# Patient Record
Sex: Female | Born: 1955 | Race: White | Hispanic: No | Marital: Married | Smoking: Never smoker
Health system: Southern US, Community
[De-identification: ages and names within clinical notes are randomized; demographics above are authoritative.]

## PROBLEM LIST (undated history)

## (undated) DIAGNOSIS — M549 Dorsalgia, unspecified: Secondary | ICD-10-CM

## (undated) DIAGNOSIS — K760 Fatty (change of) liver, not elsewhere classified: Secondary | ICD-10-CM

## (undated) DIAGNOSIS — M2550 Pain in unspecified joint: Secondary | ICD-10-CM

## (undated) DIAGNOSIS — N189 Chronic kidney disease, unspecified: Secondary | ICD-10-CM

## (undated) DIAGNOSIS — G629 Polyneuropathy, unspecified: Secondary | ICD-10-CM

## (undated) DIAGNOSIS — R600 Localized edema: Secondary | ICD-10-CM

## (undated) DIAGNOSIS — I10 Essential (primary) hypertension: Secondary | ICD-10-CM

## (undated) DIAGNOSIS — E559 Vitamin D deficiency, unspecified: Secondary | ICD-10-CM

## (undated) DIAGNOSIS — M109 Gout, unspecified: Secondary | ICD-10-CM

## (undated) DIAGNOSIS — K219 Gastro-esophageal reflux disease without esophagitis: Secondary | ICD-10-CM

## (undated) DIAGNOSIS — K5900 Constipation, unspecified: Secondary | ICD-10-CM

## (undated) DIAGNOSIS — E669 Obesity, unspecified: Secondary | ICD-10-CM

## (undated) DIAGNOSIS — J189 Pneumonia, unspecified organism: Secondary | ICD-10-CM

## (undated) DIAGNOSIS — M1990 Unspecified osteoarthritis, unspecified site: Secondary | ICD-10-CM

## (undated) DIAGNOSIS — E785 Hyperlipidemia, unspecified: Secondary | ICD-10-CM

## (undated) DIAGNOSIS — K829 Disease of gallbladder, unspecified: Secondary | ICD-10-CM

## (undated) HISTORY — DX: Pain in unspecified joint: M25.50

## (undated) HISTORY — DX: Constipation, unspecified: K59.00

## (undated) HISTORY — PX: TONSILLECTOMY: SUR1361

## (undated) HISTORY — DX: Localized edema: R60.0

## (undated) HISTORY — DX: Fatty (change of) liver, not elsewhere classified: K76.0

## (undated) HISTORY — PX: WRIST SURGERY: SHX841

## (undated) HISTORY — DX: Vitamin D deficiency, unspecified: E55.9

## (undated) HISTORY — PX: HERNIA REPAIR: SHX51

## (undated) HISTORY — DX: Gout, unspecified: M10.9

## (undated) HISTORY — DX: Essential (primary) hypertension: I10

## (undated) HISTORY — DX: Hyperlipidemia, unspecified: E78.5

## (undated) HISTORY — PX: DENTAL SURGERY: SHX609

## (undated) HISTORY — DX: Obesity, unspecified: E66.9

## (undated) HISTORY — DX: Polyneuropathy, unspecified: G62.9

## (undated) HISTORY — DX: Dorsalgia, unspecified: M54.9

## (undated) HISTORY — DX: Disease of gallbladder, unspecified: K82.9

---

## 1979-07-03 DIAGNOSIS — J189 Pneumonia, unspecified organism: Secondary | ICD-10-CM

## 1979-07-03 HISTORY — DX: Pneumonia, unspecified organism: J18.9

## 1997-11-01 HISTORY — PX: ABDOMINAL HYSTERECTOMY: SHX81

## 1998-07-25 ENCOUNTER — Other Ambulatory Visit: Admission: RE | Admit: 1998-07-25 | Discharge: 1998-07-25 | Payer: Self-pay | Admitting: Obstetrics and Gynecology

## 1998-10-20 ENCOUNTER — Inpatient Hospital Stay (HOSPITAL_COMMUNITY): Admission: RE | Admit: 1998-10-20 | Discharge: 1998-10-22 | Payer: Self-pay | Admitting: Obstetrics and Gynecology

## 2000-06-03 ENCOUNTER — Other Ambulatory Visit: Admission: RE | Admit: 2000-06-03 | Discharge: 2000-06-03 | Payer: Self-pay | Admitting: Obstetrics and Gynecology

## 2001-06-12 ENCOUNTER — Other Ambulatory Visit: Admission: RE | Admit: 2001-06-12 | Discharge: 2001-06-12 | Payer: Self-pay | Admitting: Obstetrics and Gynecology

## 2002-09-11 ENCOUNTER — Encounter: Payer: Self-pay | Admitting: Obstetrics and Gynecology

## 2002-09-11 ENCOUNTER — Ambulatory Visit (HOSPITAL_COMMUNITY): Admission: RE | Admit: 2002-09-11 | Discharge: 2002-09-11 | Payer: Self-pay | Admitting: Obstetrics and Gynecology

## 2002-09-11 ENCOUNTER — Other Ambulatory Visit: Admission: RE | Admit: 2002-09-11 | Discharge: 2002-09-11 | Payer: Self-pay | Admitting: Obstetrics and Gynecology

## 2004-04-10 ENCOUNTER — Other Ambulatory Visit: Admission: RE | Admit: 2004-04-10 | Discharge: 2004-04-10 | Payer: Self-pay | Admitting: Obstetrics and Gynecology

## 2004-06-26 ENCOUNTER — Ambulatory Visit (HOSPITAL_COMMUNITY): Admission: RE | Admit: 2004-06-26 | Discharge: 2004-06-26 | Payer: Self-pay | Admitting: Family Medicine

## 2005-04-23 ENCOUNTER — Other Ambulatory Visit: Admission: RE | Admit: 2005-04-23 | Discharge: 2005-04-23 | Payer: Self-pay | Admitting: Obstetrics and Gynecology

## 2008-02-16 ENCOUNTER — Encounter: Admission: RE | Admit: 2008-02-16 | Discharge: 2008-02-16 | Payer: Self-pay | Admitting: Family Medicine

## 2008-03-05 ENCOUNTER — Encounter: Admission: RE | Admit: 2008-03-05 | Discharge: 2008-03-05 | Payer: Self-pay | Admitting: Family Medicine

## 2008-11-01 HISTORY — PX: CHOLECYSTECTOMY: SHX55

## 2009-04-11 ENCOUNTER — Encounter (INDEPENDENT_AMBULATORY_CARE_PROVIDER_SITE_OTHER): Payer: Self-pay | Admitting: General Surgery

## 2009-04-11 ENCOUNTER — Ambulatory Visit (HOSPITAL_COMMUNITY): Admission: RE | Admit: 2009-04-11 | Discharge: 2009-04-11 | Payer: Self-pay | Admitting: General Surgery

## 2010-11-01 HISTORY — PX: TOE SURGERY: SHX1073

## 2011-02-08 LAB — DIFFERENTIAL
Basophils Absolute: 0 10*3/uL (ref 0.0–0.1)
Basophils Relative: 1 % (ref 0–1)
Eosinophils Relative: 2 % (ref 0–5)
Lymphocytes Relative: 27 % (ref 12–46)
Monocytes Absolute: 0.7 10*3/uL (ref 0.1–1.0)

## 2011-02-08 LAB — CBC
HCT: 41.3 % (ref 36.0–46.0)
Hemoglobin: 14.1 g/dL (ref 12.0–15.0)
MCHC: 34 g/dL (ref 30.0–36.0)
MCV: 84 fL (ref 78.0–100.0)
Platelets: 205 10*3/uL (ref 150–400)
RBC: 4.92 MIL/uL (ref 3.87–5.11)
RDW: 14.1 % (ref 11.5–15.5)
WBC: 6.8 10*3/uL (ref 4.0–10.5)

## 2011-02-08 LAB — COMPREHENSIVE METABOLIC PANEL
ALT: 102 U/L — ABNORMAL HIGH (ref 0–35)
AST: 59 U/L — ABNORMAL HIGH (ref 0–37)
Albumin: 3.5 g/dL (ref 3.5–5.2)
Alkaline Phosphatase: 84 U/L (ref 39–117)
BUN: 15 mg/dL (ref 6–23)
CO2: 27 mEq/L (ref 19–32)
Calcium: 9.6 mg/dL (ref 8.4–10.5)
Chloride: 103 mEq/L (ref 96–112)
Creatinine, Ser: 0.69 mg/dL (ref 0.4–1.2)
GFR calc Af Amer: >60 mL/min (ref >60)
GFR calc non Af Amer: >60 mL/min (ref >60)
Glucose, Bld: 98 mg/dL (ref 70–99)
Potassium: 3.8 mEq/L (ref 3.5–5.1)
Sodium: 138 mEq/L (ref 135–145)
Total Bilirubin: 0.7 mg/dL (ref 0.3–1.2)
Total Protein: 6.2 g/dL (ref 6.0–8.3)

## 2011-02-08 LAB — HEPATIC FUNCTION PANEL
ALT: 91 U/L — ABNORMAL HIGH (ref 0–35)
Bilirubin, Direct: <0.1 mg/dL (ref 0.0–0.3)
Total Bilirubin: 1 mg/dL (ref 0.3–1.2)

## 2011-03-16 NOTE — Op Note (Signed)
Evelyn Campbell, Evelyn Campbell             ACCOUNT NO.:  [id number]   MEDICAL RECORD NO.:  [id number]          PATIENT TYPE:  AMB   LOCATION:  SDS                          FACILITY:  [facility]   PHYSICIAN:  Cherylynn Ridges, M.D.    DATE OF BIRTH:  [birth year]   DATE OF PROCEDURE:  DATE OF DISCHARGE:  04/11/2009                               OPERATIVE REPORT   PREOPERATIVE DIAGNOSIS:  Gallbladder polyps with symptoms.   POSTOPERATIVE DIAGNOSIS:  Gallbladder polyps with symptoms.   PROCEDURE:  Laparoscopic cholecystectomy with cholangiogram.   SURGEON:  Cherylynn Ridges, M.D.   ASSISTANT:  Ollen Gross. Vernell Morgans, M.D.   ANESTHESIA:  General endotracheal.   ESTIMATED BLOOD LOSS:  Less than 20 mL.   COMPLICATIONS:  None.   CONDITION:  Stable.   FINDINGS:  Cystic artery which came out side of the triangle of Calot.  Normal cholangiogram.  Good flow to the duodenum, no obstruction.   INDICATIONS FOR OPERATION:  The patient is a 55 year old woman with non  gallbladder polyps and symptoms.  He now comes in for a laparoscopic  cholecystectomy operation.  The patient was taken to the operating room,  placed on table in supine position.  After an adequate general  endotracheal anesthetic was administered, she was prepped and draped in  usual sterile manner exposing the midline in the right upper quadrant.   A supraumbilical midline incision was made using #11 blade and taken  down to the midline fascia.  We grabbed the fascia with 2 Kocher clamps  and then incised between the clamps using a 15 blade into the  preperitoneal space.  We bluntly dissected down through the  preperitoneal space into the peritoneum and then passed a pursestring  suture of 0 Vicryl around the fascial opening.  A Hasson cannula was  passed through the fascia, into the peritoneal cavity and secured in  place with pursestring suture.  Once this was done, carbon dioxide gas  was insufflated into the peritoneal cavity up to a maximum  intra-  abdominal pressure of 15 mmHg.   Two right costal margin 5-mm cannulas and a subxiphoid 12 and 11-mm  cannula passed under direct vision.  Once this was done the patient was  placed in reverse Trendelenburg.  The left side was tilted down and then  dissection began.   The gallbladder had some adhesions to it which were bluntly dissected  away including heart of the duodenum without any injury to the  surrounding structures.  We grabbed the infundibulum with the second  grasper and then dissected out the peritoneum overlying the triangle of  Calot and hepatoduodenal triangle.  There was a tubular structure  outside of the triangle which we dissected free getting an adequate  window which was the primary cystic artery which clipped proximally and  distally with Endoclips.  We then dissected out the cystic duct, got an  adequate wound around it, placed a proximal clip along the gallbladder  side.  We then made a cholecystodochotomy using laparoscopic scissors.  This allowed [name] to pass a cholangiocath into the good cystic duct in  which we performed a cholangiogram, showed good flow into the duodenum,  no intraductal filling defects, no dilatation, and good proximal flow.   Once the cholangiogram was completed, we removed the clips securing in a  place, and then clipped the distal cystic duct with the catheter  removed.  Three clips were placed.  We then transected the cystic duct.  We then dissected out the gallbladder from its bed with minimal  difficulty.  We denied using EndoCatch bag to remove, we removed it  directly from the supraumbilical site with no difficulty.  We closed off  the supraumbilical fascia using a pursestring suture, which was in  place.  We then aspirated all fluid and gas from above the liver and  removed all cannulas.   We injected 0.25% Marcaine with epi at all sites.  We closed  supraumbilical skin using a running subcuticular stitch with 4-0   Monocryl.  The subxiphoid site was closed using 4-0 Monocryl.  We then  closed the lateral cannula sites using Dermabond.  Steri-Strips,  Dermabond and Tegaderms were placed at all sites.  All needles counts,  sponges counts, and instruments counts were correct.      Cherylynn Ridges, M.D.  Electronically Signed     JOW/MEDQ  D:  04/11/2009  T:  04/12/2009  Job:  [id number]   cc:   Jethro Bastos, M.D.  Duncan Dull, M.D.

## 2012-06-01 ENCOUNTER — Other Ambulatory Visit: Payer: Self-pay | Admitting: Obstetrics and Gynecology

## 2013-05-10 DIAGNOSIS — M19079 Primary osteoarthritis, unspecified ankle and foot: Secondary | ICD-10-CM | POA: Insufficient documentation

## 2013-05-10 DIAGNOSIS — M76829 Posterior tibial tendinitis, unspecified leg: Secondary | ICD-10-CM | POA: Insufficient documentation

## 2013-05-10 DIAGNOSIS — M2140 Flat foot [pes planus] (acquired), unspecified foot: Secondary | ICD-10-CM | POA: Insufficient documentation

## 2013-06-04 ENCOUNTER — Other Ambulatory Visit: Payer: Self-pay | Admitting: Obstetrics and Gynecology

## 2014-02-05 DIAGNOSIS — M14672 Charcot's joint, left ankle and foot: Secondary | ICD-10-CM | POA: Diagnosis present

## 2014-02-05 DIAGNOSIS — M21869 Other specified acquired deformities of unspecified lower leg: Secondary | ICD-10-CM | POA: Insufficient documentation

## 2014-04-01 HISTORY — PX: ANKLE FUSION: SHX5718

## 2014-05-07 DIAGNOSIS — Z981 Arthrodesis status: Secondary | ICD-10-CM | POA: Insufficient documentation

## 2014-06-14 ENCOUNTER — Ambulatory Visit (INDEPENDENT_AMBULATORY_CARE_PROVIDER_SITE_OTHER): Payer: BC Managed Care – PPO | Admitting: Cardiology

## 2014-06-14 ENCOUNTER — Encounter: Payer: Self-pay | Admitting: Cardiology

## 2014-06-14 VITALS — BP 110/76 | HR 76 | Ht 66.0 in | Wt 227.0 lb

## 2014-06-14 DIAGNOSIS — Z8249 Family history of ischemic heart disease and other diseases of the circulatory system: Secondary | ICD-10-CM | POA: Insufficient documentation

## 2014-06-14 DIAGNOSIS — Z6841 Body Mass Index (BMI) 40.0 and over, adult: Secondary | ICD-10-CM | POA: Insufficient documentation

## 2014-06-14 DIAGNOSIS — E669 Obesity, unspecified: Secondary | ICD-10-CM

## 2014-06-14 DIAGNOSIS — I10 Essential (primary) hypertension: Secondary | ICD-10-CM | POA: Insufficient documentation

## 2014-06-14 MED ORDER — ASPIRIN EC 81 MG PO TBEC: 81.0000 mg | DELAYED_RELEASE_TABLET | Freq: Every day | ORAL | Status: DC

## 2014-06-14 NOTE — Progress Notes (Signed)
[facility]. [address], [address], [place]  [address] Phone: [phone] Fax:  [phone]  Date:  06/14/2014   ID:  Evelyn Campbell, DOB 1956-03-16, MRN [id number]  PCP:  Cari Caraway, MD   History of Present Illness: Evelyn Campbell is a 58 y.o. female with hyperlipidemia, hypertriglyceridemia, hypertension. Early family history of coronary artery- father died at age 75 from MI.   She has been seeing [name], Pharm.D. in the lipid clinic. See current drug regimen. We have not made any changes today.   Niaspan, intolerant to statins. I'm fine with normal lipid panels.  No complaints of chest pain, shortness of breath, fevers. It is hard for her to lose weight but despite her sedentary state currently after ankle reconstruction, she is losing weight.  She had right ankle reconstruction at [facility]. 8 weeks out.  She is here with her husband.   Wt Readings from Last 3 Encounters:  06/14/14 227 lb (102.967 kg)     Past Medical History  Diagnosis Date  . Hypertension   . Hyperlipidemia   . Fatty liver   . Vitamin D deficiency   . Peripheral neuropathy     No past surgical history on file.  Current Outpatient Prescriptions  Medication Sig Dispense Refill  . amLODipine (NORVASC) 5 MG tablet Take 5 mg by mouth daily.      Marland Kitchen aspirin 325 MG tablet Take 325 mg by mouth daily.      Marland Kitchen atenolol-chlorthalidone (TENORETIC) 100-25 MG per tablet Take 1 tablet by mouth daily.      . calcium carbonate (OS-CAL) 600 MG TABS tablet Take 600 mg by mouth 2 (two) times daily with a meal.      . Cholecalciferol (VITAMIN D3) 1000 UNITS CAPS Take by mouth.      . docusate sodium (STOOL SOFTENER) 100 MG capsule Take 100 mg by mouth 2 (two) times daily.      Marland Kitchen FIBER COMPLETE PO Take by mouth.      . gabapentin (NEURONTIN) 300 MG capsule Take 300 mg by mouth 3 (three) times daily.      Marland Kitchen lisinopril (PRINIVIL,ZESTRIL) 20 MG tablet Take 20 mg by mouth daily.      . Multiple  Vitamin (MULTIVITAMIN) capsule Take 1 capsule by mouth daily.      . niacin (NIASPAN) 1000 MG CR tablet Take 1,000 mg by mouth at bedtime.      Marland Kitchen omega-3 acid ethyl esters (LOVAZA) 1 G capsule Take by mouth 2 (two) times daily.      Marland Kitchen oxyCODONE-acetaminophen (PERCOCET/ROXICET) 5-325 MG per tablet       . Probiotic Product (PEARLS IC PO) Take by mouth.       No current facility-administered medications for this visit.    Allergies:    Allergies  Allergen Reactions  . Ibuprofen   . Lipitor [Atorvastatin]     myalgia    Social History:  The patient  reports that she has never smoked. She does not have any smokeless tobacco history on file.   Family History  Problem Relation Age of Onset  . Heart failure Father     ROS:  Please see the history of present illness.   No syncope, no bleeding, no orthopnea, no PND   All other systems reviewed and negative.   PHYSICAL EXAM: VS:  BP 110/76  Pulse 76  Ht 5\' 6"  (1.676 m)  Wt 227 lb (102.967 kg)  BMI 36.66  kg/m2 Well nourished, well developed, in no acute distress HEENT: normal, [place]/AT, EOMI Neck: no JVD, normal carotid upstroke, no bruit Cardiac:  normal S1, S2; RRR; no murmur Lungs:  clear to auscultation bilaterally, no wheezing, rhonchi or rales Abd: soft, nontender, no hepatomegaly, no bruits obese Ext: no edema, right ankle boot in place, scooter. Skin: warm and dry GU: deferred Neuro: no focal abnormalities noted, AAO x 3  EKG:  06/14/14 -- sinus rhythm, 76, and no other abnormalities.    LABS: LDL 125 Prior medical records reviewed.  ASSESSMENT AND PLAN:  1. Early family history of coronary artery disease-continue with aggressive primary prevention. She has been intolerant to statins therefore utilize niacin. Last LDL 125. Slightly increased from 108. I am fine with her transitioning to a low-dose aspirin. 2. Obesity-continue to encourage weight loss, low carbohydrate, diet, exercise. She is losing weight.  Continue. 3. Hypertension-currently well controlled, medications reviewed 4. One year follow up.  Signed, Candee Furbish, MD [employer]  06/14/2014 12:14 PM

## 2014-06-14 NOTE — Patient Instructions (Addendum)
Please decrease asprin to 81 mg a day.  Continue all other medications as listed.  Follow up in 1 year with Dr. Marlou Porch.  You will receive a letter in the mail 2 months before you are due.  Please call us when you receive this letter to schedule your follow up appointment.

## 2014-06-26 ENCOUNTER — Emergency Department (HOSPITAL_COMMUNITY)
Admission: EM | Admit: 2014-06-26 | Discharge: 2014-06-26 | Disposition: A | Discharge status: Home / Self Care | Payer: BC Managed Care – PPO | Attending: Emergency Medicine | Admitting: Emergency Medicine

## 2014-06-26 ENCOUNTER — Emergency Department (HOSPITAL_COMMUNITY): Payer: BC Managed Care – PPO

## 2014-06-26 ENCOUNTER — Encounter (HOSPITAL_COMMUNITY): Payer: Self-pay | Admitting: Emergency Medicine

## 2014-06-26 DIAGNOSIS — E669 Obesity, unspecified: Secondary | ICD-10-CM | POA: Insufficient documentation

## 2014-06-26 DIAGNOSIS — G609 Hereditary and idiopathic neuropathy, unspecified: Secondary | ICD-10-CM | POA: Diagnosis not present

## 2014-06-26 DIAGNOSIS — E785 Hyperlipidemia, unspecified: Secondary | ICD-10-CM | POA: Insufficient documentation

## 2014-06-26 DIAGNOSIS — Z7982 Long term (current) use of aspirin: Secondary | ICD-10-CM | POA: Insufficient documentation

## 2014-06-26 DIAGNOSIS — I10 Essential (primary) hypertension: Secondary | ICD-10-CM | POA: Diagnosis not present

## 2014-06-26 DIAGNOSIS — Z79899 Other long term (current) drug therapy: Secondary | ICD-10-CM | POA: Insufficient documentation

## 2014-06-26 DIAGNOSIS — E559 Vitamin D deficiency, unspecified: Secondary | ICD-10-CM | POA: Diagnosis not present

## 2014-06-26 DIAGNOSIS — N949 Unspecified condition associated with female genital organs and menstrual cycle: Secondary | ICD-10-CM | POA: Insufficient documentation

## 2014-06-26 DIAGNOSIS — N289 Disorder of kidney and ureter, unspecified: Secondary | ICD-10-CM | POA: Diagnosis not present

## 2014-06-26 DIAGNOSIS — K4020 Bilateral inguinal hernia, without obstruction or gangrene, not specified as recurrent: Secondary | ICD-10-CM | POA: Diagnosis not present

## 2014-06-26 DIAGNOSIS — K4021 Bilateral inguinal hernia, without obstruction or gangrene, recurrent: Secondary | ICD-10-CM

## 2014-06-26 LAB — URINALYSIS, ROUTINE W REFLEX MICROSCOPIC
Bilirubin Urine: NEGATIVE
Glucose, UA: NEGATIVE mg/dL
Hgb urine dipstick: NEGATIVE
KETONES UR: NEGATIVE mg/dL
Nitrite: NEGATIVE
PROTEIN: NEGATIVE mg/dL
Specific Gravity, Urine: 1.02 (ref 1.005–1.030)
UROBILINOGEN UA: 0.2 mg/dL (ref 0.0–1.0)
pH: 6 (ref 5.0–8.0)

## 2014-06-26 LAB — CBC WITH DIFFERENTIAL/PLATELET
BASOS ABS: 0 10*3/uL (ref 0.0–0.1)
BASOS PCT: 0 % (ref 0–1)
Eosinophils Absolute: 0.1 10*3/uL (ref 0.0–0.7)
Eosinophils Relative: 1 % (ref 0–5)
HCT: 36.3 % (ref 36.0–46.0)
HEMOGLOBIN: 11.9 g/dL — AB (ref 12.0–15.0)
Lymphocytes Relative: 30 % (ref 12–46)
Lymphs Abs: 2.6 10*3/uL (ref 0.7–4.0)
MCH: 28.1 pg (ref 26.0–34.0)
MCHC: 32.8 g/dL (ref 30.0–36.0)
MCV: 85.6 fL (ref 78.0–100.0)
MONOS PCT: 8 % (ref 3–12)
Monocytes Absolute: 0.7 10*3/uL (ref 0.1–1.0)
NEUTROS ABS: 5.3 10*3/uL (ref 1.7–7.7)
NEUTROS PCT: 61 % (ref 43–77)
PLATELETS: 210 10*3/uL (ref 150–400)
RBC: 4.24 MIL/uL (ref 3.87–5.11)
RDW: 15.7 % — ABNORMAL HIGH (ref 11.5–15.5)
WBC: 8.7 10*3/uL (ref 4.0–10.5)

## 2014-06-26 LAB — HEPATIC FUNCTION PANEL
ALBUMIN: 3.5 g/dL (ref 3.5–5.2)
ALT: 30 U/L (ref 0–35)
AST: 22 U/L (ref 0–37)
Alkaline Phosphatase: 79 U/L (ref 39–117)
Bilirubin, Direct: <0.2 mg/dL (ref 0.0–0.3)
TOTAL PROTEIN: 6.8 g/dL (ref 6.0–8.3)
Total Bilirubin: 0.2 mg/dL — ABNORMAL LOW (ref 0.3–1.2)

## 2014-06-26 LAB — URINE MICROSCOPIC-ADD ON

## 2014-06-26 LAB — WET PREP, GENITAL
CLUE CELLS WET PREP: NONE SEEN
TRICH WET PREP: NONE SEEN
Yeast Wet Prep HPF POC: NONE SEEN

## 2014-06-26 LAB — BASIC METABOLIC PANEL
ANION GAP: 14 (ref 5–15)
BUN: 36 mg/dL — AB (ref 6–23)
CHLORIDE: 100 meq/L (ref 96–112)
CO2: 26 mEq/L (ref 19–32)
Calcium: 9.7 mg/dL (ref 8.4–10.5)
Creatinine, Ser: 0.82 mg/dL (ref 0.50–1.10)
GFR, EST AFRICAN AMERICAN: 88 mL/min — AB (ref >90)
GFR, EST NON AFRICAN AMERICAN: 76 mL/min — AB (ref >90)
Glucose, Bld: 146 mg/dL — ABNORMAL HIGH (ref 70–99)
POTASSIUM: 3.7 meq/L (ref 3.7–5.3)
SODIUM: 140 meq/L (ref 137–147)

## 2014-06-26 LAB — PROTIME-INR
INR: 0.98 (ref 0.00–1.49)
PROTHROMBIN TIME: 13 s (ref 11.6–15.2)

## 2014-06-26 LAB — APTT: APTT: 28 s (ref 24–37)

## 2014-06-26 LAB — LIPASE, BLOOD: LIPASE: 44 U/L (ref 11–59)

## 2014-06-26 MED ORDER — IOHEXOL 300 MG/ML  SOLN
100.0000 mL | Freq: Once | INTRAMUSCULAR | Status: AC | PRN
  Administered 2014-06-26: 100 mL via INTRAVENOUS

## 2014-06-26 MED ORDER — HYDROCODONE-ACETAMINOPHEN 5-325 MG PO TABS: ORAL_TABLET | ORAL | Status: DC

## 2014-06-26 MED ORDER — IOHEXOL 300 MG/ML  SOLN
50.0000 mL | Freq: Once | INTRAMUSCULAR | Status: AC | PRN
  Administered 2014-06-26: 50 mL via ORAL

## 2014-06-26 NOTE — ED Notes (Signed)
Pt from PCP, states that pt has incarcerated bowel in an inguinal hernia.

## 2014-06-26 NOTE — ED Notes (Signed)
Pt reports pelvic pain with nausea which started last week and got better.  Pt reports pain recurred yesterday with severe nausea denies any vomiting.  Pt coming from her GYN MD today, concerned about a blockage.  Pt denies urinary sxs at this time.  Pt reports feeling a hard lump in her pelvic area, pt also reports low back pain.

## 2014-06-26 NOTE — ED Provider Notes (Signed)
CSN: [id number]     Arrival date & time 06/26/14  1704 History   First MD Initiated Contact with Patient 06/26/14 1736     Chief Complaint  Patient presents with  . Pelvic Pain  . Nausea     (Consider location/radiation/quality/duration/timing/severity/associated sxs/prior Treatment) Patient is a 58 y.o. female presenting with pelvic pain.  Pelvic Pain    Evelyn Campbell is a 58 y.o. female with past medical history significant for hypertension, hyperlipidemia complaining of 7/10 left-sided pelvic pain intermittently for the last several months, starting again today. She has associated vomiting with. Constipation and diarrhea over the last several days. Patient states that she feels a lump in the area where her pain is. Patient denies abnormal vaginal discharge,  Dysuria, hematuria. She went to her OB/GYN and was sent to the ED for concern of her small bowel obstruction. No pelvic exam was performed at the OB/GYN. Patient denies any fever, chills, chest pain, shortness of breath, history of extensive abdominal surgeries, history of small bowel obstruction.     Past Medical History  Diagnosis Date  . Hypertension   . Hyperlipidemia   . Fatty liver   . Vitamin D deficiency   . Peripheral neuropathy    History reviewed. No pertinent past surgical history. Family History  Problem Relation Age of Onset  . Heart failure Father    History  Substance Use Topics  . Smoking status: Never Smoker   . Smokeless tobacco: Not on file  . Alcohol Use: No   OB History   Grav Para Term Preterm Abortions TAB SAB Ect Mult Living                 Review of Systems  Genitourinary: Positive for pelvic pain.    10 systems reviewed and found to be negative, except as noted in the HPI.   Allergies  Celebrex; Ibuprofen; Lipitor; Meloxicam; and Statins  Home Medications   Prior to Admission medications   Medication Sig Start Date End Date Taking? Authorizing Provider  amLODipine (NORVASC)  5 MG tablet Take 5 mg by mouth daily.   Yes Historical Provider, MD  aspirin 325 MG tablet Take 325 mg by mouth at bedtime.   Yes Historical Provider, MD  atenolol-chlorthalidone (TENORETIC) 100-25 MG per tablet Take 1 tablet by mouth daily.   Yes Historical Provider, MD  calcium carbonate (OS-CAL) 600 MG TABS tablet Take 600 mg by mouth 2 (two) times daily with a meal.   Yes Historical Provider, MD  Cholecalciferol (VITAMIN D3) 1000 UNITS CAPS Take by mouth.   Yes Historical Provider, MD  docusate sodium (STOOL SOFTENER) 100 MG capsule Take 100 mg by mouth 2 (two) times daily.   Yes Historical Provider, MD  FIBER COMPLETE PO Take by mouth.   Yes Historical Provider, MD  gabapentin (NEURONTIN) 300 MG capsule Take 900 mg by mouth at bedtime.    Yes Historical Provider, MD  lisinopril (PRINIVIL,ZESTRIL) 20 MG tablet Take 20 mg by mouth daily.   Yes Historical Provider, MD  Multiple Vitamin (MULTIVITAMIN) capsule Take 1 capsule by mouth daily.   Yes Historical Provider, MD  niacin (NIASPAN) 1000 MG CR tablet Take 2,000 mg by mouth at bedtime.    Yes Historical Provider, MD  omega-3 acid ethyl esters (LOVAZA) 1 G capsule Take 2 g by mouth 2 (two) times daily.    Yes Historical Provider, MD  HYDROcodone-acetaminophen (NORCO/VICODIN) 5-325 MG per tablet Take 1-2 tablets by mouth every 6 hours as needed  for pain. 06/26/14   Ramsie Ostrander, PA-C   BP 132/72  Pulse 66  Temp(Src) 97.8 F (36.6 C) (Oral)  Resp 17  SpO2 99% Physical Exam  Nursing note and vitals reviewed. Constitutional: She is oriented to person, place, and time. She appears well-developed and well-nourished. No distress.  Obese  HENT:  Head: Normocephalic.  Mouth/Throat: Oropharynx is clear and moist.  Eyes: Conjunctivae and EOM are normal. Pupils are equal, round, and reactive to light.  Neck: Normal range of motion.  Cardiovascular: Normal rate, regular rhythm and intact distal pulses.   Pulmonary/Chest: Effort normal and  breath sounds normal. No stridor.  Abdominal: Soft. Bowel sounds are normal. She exhibits no distension and no mass. There is tenderness. There is no rebound and no guarding.    Musculoskeletal: Normal range of motion. She exhibits no edema.  Cam Walker to right lower extremity  Neurological: She is alert and oriented to person, place, and time.  Skin: Skin is warm.  Psychiatric: She has a normal mood and affect.    ED Course  Procedures (including critical care time) Labs Review Labs Reviewed  WET PREP, GENITAL - Abnormal; Notable for the following:    WBC, Wet Prep HPF POC FEW (*)    All other components within normal limits  CBC WITH DIFFERENTIAL - Abnormal; Notable for the following:    Hemoglobin 11.9 (*)    RDW 15.7 (*)    All other components within normal limits  BASIC METABOLIC PANEL - Abnormal; Notable for the following:    Glucose, Bld 146 (*)    BUN 36 (*)    GFR calc non Af Amer 76 (*)    GFR calc Af Amer 88 (*)    All other components within normal limits  HEPATIC FUNCTION PANEL - Abnormal; Notable for the following:    Total Bilirubin 0.2 (*)    All other components within normal limits  URINALYSIS, ROUTINE W REFLEX MICROSCOPIC - Abnormal; Notable for the following:    APPearance CLOUDY (*)    Leukocytes, UA SMALL (*)    All other components within normal limits  URINE MICROSCOPIC-ADD ON - Abnormal; Notable for the following:    Squamous Epithelial / LPF FEW (*)    Casts HYALINE CASTS (*)    All other components within normal limits  GC/CHLAMYDIA PROBE AMP  PROTIME-INR  APTT  LIPASE, BLOOD    Imaging Review Ct Abdomen Pelvis W Contrast  06/26/2014   CLINICAL DATA:  Left lower quadrant pain. Question incarcerated hernia.  EXAM: CT ABDOMEN AND PELVIS WITH CONTRAST  TECHNIQUE: Multidetector CT imaging of the abdomen and pelvis was performed using the standard protocol following bolus administration of intravenous contrast.  CONTRAST:  50 mL OMNIPAQUE IOHEXOL  300 MG/ML SOLN, 100 mL OMNIPAQUE IOHEXOL 300 MG/ML SOLN  COMPARISON:  None.  FINDINGS: The lung bases are clear.  No pleural or pericardial effusion.  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 There is a small amount of macroscopic fat within the lesion. The right adrenal gland is unremarkable. The patient is status post cholecystectomy. The liver, spleen and pancreas appear normal. There is a 1.7 cm lesion in the left kidney which is indeterminate. It does not have the typical appearance of a cyst. Small low attenuating lesions in the right kidney are compatible with cysts. The patient has a fat containing ventral hernia measuring 4.7 cm craniocaudal by 3.3 cm transverse. There are also bilateral inguinal hernias  containing fat. The left inguinal hernia is larger and has a small amount of fluid.  The patient is status post hysterectomy. The stomach and small and large bowel are unremarkable. The appendix is not visualized and may have been removed. There is no lymphadenopathy. No focal bony abnormality is identified.  IMPRESSION: Indeterminate left renal lesion. MRI with and without contrast is recommended for further evaluation.  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  Negative for incarcerated hernia. The patient has fat containing midline and bilateral inguinal hernias, larger on the left. The left inguinal hernia also has a small amount of fluid within it.   Electronically Signed   By: Inge Rise M.D.   On: 06/26/2014 21:57     EKG Interpretation None      MDM   Final diagnoses:  Bilateral recurrent inguinal hernia without obstruction or gangrene  Renal lesion   Filed Vitals:   06/26/14 1712 06/26/14 1939 06/26/14 2240  BP: 125/55 134/71 132/72  Pulse: 63 67 66  Temp: 98.1 F (36.7 C)  97.8  F (36.6 C)  TempSrc: Oral  Oral  Resp: 20 18 17   SpO2: 98% 99% 99%    Medications  iohexol (OMNIPAQUE) 300 MG/ML solution 50 mL (50 mLs Oral Contrast Given 06/26/14 2128)  iohexol (OMNIPAQUE) 300 MG/ML solution 100 mL (100 mLs Intravenous Contrast Given 06/26/14 2128)    Evelyn Campbell is a 58 y.o. female presenting with pelvic pain and nausea by GYN to rule out SpO2 abdominal exam is benign. CT shows bilateral fat containing inguinal hernias and an incidentally noted left renal abnormality. Extensive discussion with patient on what this means. I advised her followup with her primary care physician to obtain an outpatient MRI. Advised her that she could see a general surgeon if the pain from the inguinal hernias persist. Return precautions are discussed.  Evaluation does not show pathology that would require ongoing emergent intervention or inpatient treatment. Pt is hemodynamically stable and mentating appropriately. Discussed findings and plan with patient/guardian, who agrees with care plan. All questions answered. Return precautions discussed and outpatient follow up given.   Discharge Medication List as of 06/26/2014 10:16 PM    START taking these medications   Details  HYDROcodone-acetaminophen (NORCO/VICODIN) 5-325 MG per tablet Take 1-2 tablets by mouth every 6 hours as needed for pain., Print             Monico Blitz, PA-C 06/27/14 0139

## 2014-06-26 NOTE — Discharge Instructions (Signed)
Take vicodin for breakthrough pain, do not drink alcohol, drive, care for children or do other critical tasks while taking vicodin.  Please follow with your primary care doctor in the next 2 days for a check-up. They must obtain records for further management and MRI to evaluate the abnormality on your left kidney.   Do not hesitate to return to the Emergency Department for any new, worsening or concerning symptoms.   Hernia A hernia occurs when an internal organ pushes out through a weak spot in the abdominal wall. Hernias most commonly occur in the groin and around the navel. Hernias often can be pushed back into place (reduced). Most hernias tend to get worse over time. Some abdominal hernias can get stuck in the opening (irreducible or incarcerated hernia) and cannot be reduced. An irreducible abdominal hernia which is tightly squeezed into the opening is at risk for impaired blood supply (strangulated hernia). A strangulated hernia is a medical emergency. Because of the risk for an irreducible or strangulated hernia, surgery may be recommended to repair a hernia. CAUSES   Heavy lifting.  Prolonged coughing.  Straining to have a bowel movement.  A cut (incision) made during an abdominal surgery. HOME CARE INSTRUCTIONS   Bed rest is not required. You may continue your normal activities.  Avoid lifting more than 10 pounds (4.5 kg) or straining.  Cough gently. If you are a smoker it is best to stop. Even the best hernia repair can break down with the continual strain of coughing. Even if you do not have your hernia repaired, a cough will continue to aggravate the problem.  Do not wear anything tight over your hernia. Do not try to keep it in with an outside bandage or truss. These can damage abdominal contents if they are trapped within the hernia sac.  Eat a normal diet.  Avoid constipation. Straining over long periods of time will increase hernia size and encourage breakdown of  repairs. If you cannot do this with diet alone, stool softeners may be used. SEEK IMMEDIATE MEDICAL CARE IF:   You have a fever.  You develop increasing abdominal pain.  You feel nauseous or vomit.  Your hernia is stuck outside the abdomen, looks discolored, feels hard, or is tender.  You have any changes in your bowel habits or in the hernia that are unusual for you.  You have increased pain or swelling around the hernia.  You cannot push the hernia back in place by applying gentle pressure while lying down. MAKE SURE YOU:   Understand these instructions.  Will watch your condition.  Will get help right away if you are not doing well or get worse. Document Released: 10/18/2005 Document Revised: 01/10/2012 Document Reviewed: 06/06/2008 [facility] Patient Information 2015 [place], [place]. This information is not intended to replace advice given to you by your health care provider. Make sure you discuss any questions you have with your health care provider.

## 2014-06-27 LAB — GC/CHLAMYDIA PROBE AMP
CT PROBE, AMP APTIMA: NEGATIVE
GC Probe RNA: NEGATIVE

## 2014-06-28 NOTE — ED Provider Notes (Signed)
Medical screening examination/treatment/procedure(s) were performed by non-physician practitioner and as supervising physician I was immediately available for consultation/collaboration.   EKG Interpretation None        Delice Bison Amarian Botero, DO 06/28/14 0022

## 2014-07-01 ENCOUNTER — Ambulatory Visit (INDEPENDENT_AMBULATORY_CARE_PROVIDER_SITE_OTHER): Payer: BC Managed Care – PPO | Admitting: Surgery

## 2014-07-01 ENCOUNTER — Encounter (INDEPENDENT_AMBULATORY_CARE_PROVIDER_SITE_OTHER): Payer: Self-pay

## 2014-07-01 ENCOUNTER — Other Ambulatory Visit: Payer: Self-pay | Admitting: Family Medicine

## 2014-07-01 ENCOUNTER — Encounter (INDEPENDENT_AMBULATORY_CARE_PROVIDER_SITE_OTHER): Payer: Self-pay | Admitting: Surgery

## 2014-07-01 VITALS — BP 112/64 | HR 79 | Temp 98.7°F | Ht 66.0 in | Wt 225.0 lb

## 2014-07-01 DIAGNOSIS — K432 Incisional hernia without obstruction or gangrene: Secondary | ICD-10-CM

## 2014-07-01 DIAGNOSIS — K4020 Bilateral inguinal hernia, without obstruction or gangrene, not specified as recurrent: Secondary | ICD-10-CM

## 2014-07-01 DIAGNOSIS — N289 Disorder of kidney and ureter, unspecified: Secondary | ICD-10-CM

## 2014-07-01 NOTE — Progress Notes (Signed)
Patient ID: Evelyn Campbell, female   DOB: [birth year], 58 y.o.   MRN: [id number]  Chief Complaint  Patient presents with  . New Evaluation    hernia    HPI Evelyn Campbell is a 58 y.o. female.   HPI This is a pleasant female referred by Dr. Helane Campbell for evaluation of incisional and bilateral inguinal hernias. The patient has been actually having upper abdominal pain for several years. She has noticed a bulge around her umbilicus. She more recently has also been noting bulges or groins. The right groin has become painful at times. The bulges will reduce after lying down at bedtime. Because of her findings on physical examination, she was sent to the emergency department and had a CAT scan of the abdomen and pelvis. These showed a chronically incarcerated hernias at the umbilicus as well as in the groins with no bowel involvement. Incidentally, she was found to have a large left adrenal mass as well as a small mass on the left kidney. MRI has been ordered to evaluate these lesions. She has no nausea or vomiting or obstructive symptoms. She is otherwise without complaints. Past Medical History  Diagnosis Date  . Hypertension   . Hyperlipidemia   . Fatty liver   . Vitamin D deficiency   . Peripheral neuropathy     History reviewed. No pertinent past surgical history.  Family History  Problem Relation Age of Onset  . Heart failure Father     Social History History  Substance Use Topics  . Smoking status: Never Smoker   . Smokeless tobacco: Not on file  . Alcohol Use: No    Allergies  Allergen Reactions  . Celebrex [Celecoxib] Diarrhea  . Ibuprofen     unknown  . Lipitor [Atorvastatin]     myalgia  . Meloxicam Diarrhea    myalgia  . Statins     unknown    Current Outpatient Prescriptions  Medication Sig Dispense Refill  . amLODipine (NORVASC) 5 MG tablet Take 5 mg by mouth daily.      Marland Kitchen aspirin 325 MG tablet Take 325 mg by mouth at bedtime.      Marland Kitchen atenolol-chlorthalidone  (TENORETIC) 100-25 MG per tablet Take 1 tablet by mouth daily.      . calcium carbonate (OS-CAL) 600 MG TABS tablet Take 600 mg by mouth 2 (two) times daily with a meal.      . Cholecalciferol (VITAMIN D3) 1000 UNITS CAPS Take by mouth.      . docusate sodium (STOOL SOFTENER) 100 MG capsule Take 100 mg by mouth 2 (two) times daily.      Marland Kitchen FIBER COMPLETE PO Take by mouth.      . gabapentin (NEURONTIN) 300 MG capsule Take 900 mg by mouth at bedtime.       Marland Kitchen lisinopril (PRINIVIL,ZESTRIL) 20 MG tablet Take 20 mg by mouth daily.      . Multiple Vitamin (MULTIVITAMIN) capsule Take 1 capsule by mouth daily.      . niacin (NIASPAN) 1000 MG CR tablet Take 2,000 mg by mouth at bedtime.       Marland Kitchen omega-3 acid ethyl esters (LOVAZA) 1 G capsule Take 2 g by mouth 2 (two) times daily.       . Probiotic Product (PROBIOTIC PEARLS PO) Take by mouth.      Marland Kitchen HYDROcodone-acetaminophen (NORCO/VICODIN) 5-325 MG per tablet Take 1-2 tablets by mouth every 6 hours as needed for pain.  15 tablet  0   No current  facility-administered medications for this visit.    Review of Systems Review of Systems  Constitutional: Negative for fever, chills and unexpected weight change.  HENT: Negative for congestion, hearing loss, sore throat, trouble swallowing and voice change.   Eyes: Negative for visual disturbance.  Respiratory: Negative for cough and wheezing.   Cardiovascular: Negative for chest pain, palpitations and leg swelling.  Gastrointestinal: Positive for abdominal pain. Negative for nausea, vomiting, diarrhea, constipation, blood in stool, abdominal distention and anal bleeding.  Genitourinary: Negative for hematuria, vaginal bleeding and difficulty urinating.  Musculoskeletal: Negative for arthralgias.  Skin: Negative for rash and wound.  Neurological: Negative for seizures, syncope and headaches.  Hematological: Negative for adenopathy. Does not bruise/bleed easily.  Psychiatric/Behavioral: Negative for  confusion.    Blood pressure 112/64, pulse 79, temperature 98.7 F (37.1 C), temperature source Oral, height 5\' 6"  (1.676 m), weight 225 lb (102.059 kg).  Physical Exam Physical Exam  Constitutional: She is oriented to person, place, and time. She appears well-developed and well-nourished. No distress.  HENT:  Head: Normocephalic and atraumatic.  Right Ear: External ear normal.  Left Ear: External ear normal.  Nose: Nose normal.  Mouth/Throat: Oropharynx is clear and moist. No oropharyngeal exudate.  Eyes: Conjunctivae are normal. Pupils are equal, round, and reactive to light. Right eye exhibits no discharge. Left eye exhibits no discharge. No scleral icterus.  Neck: Normal range of motion. Neck supple. No tracheal deviation present.  Cardiovascular: Normal rate, regular rhythm, normal heart sounds and intact distal pulses.   No murmur heard. Pulmonary/Chest: Effort normal and breath sounds normal. No respiratory distress. She has no wheezes.  Abdominal: Soft. Bowel sounds are normal. There is no tenderness.  There is a chronically incarcerated hernia around the umbilicus. There are bilateral inguinal hernias. Today, making her hernias are nontender and I can reduce them  Musculoskeletal: Normal range of motion. She exhibits no edema and no tenderness.  Except for the right foot which is in an orthopedic boot  Neurological: She is alert and oriented to person, place, and time.  Skin: Skin is warm and dry. No rash noted. No erythema.  Psychiatric: Her behavior is normal.    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 She will need to get the MRI first to make sure she does not have the need for surgical intervention of the adrenal mass or renal lesion before  proceeding with surgery. I discussed the reasons for this the patient and her husband in detail. I discussed laparoscopic versus open hernia repair with mesh. My plan would be to proceed with laparoscopic repair of all the hernias with mesh. If something needs to be done with the adrenal or renal lesion by urology, this can be performed at the same time as the hernia repair. At this point, I feel it is reasonable that she may work without limits. I will call her back after the MRI is complete to discuss things further and proceed accordingly.       Taneya Conkel A 07/01/2014, 3:52 PM

## 2014-07-01 NOTE — Patient Instructions (Signed)
I will view her MRI and we'll plan further.

## 2014-07-04 ENCOUNTER — Ambulatory Visit
Admission: RE | Admit: 2014-07-04 | Discharge: 2014-07-04 | Disposition: A | Discharge status: Home / Self Care | Payer: BC Managed Care – PPO | Source: Ambulatory Visit | Attending: Family Medicine | Admitting: Family Medicine

## 2014-07-04 DIAGNOSIS — N289 Disorder of kidney and ureter, unspecified: Secondary | ICD-10-CM

## 2014-07-04 MED ORDER — GADOBENATE DIMEGLUMINE 529 MG/ML IV SOLN
20.0000 mL | Freq: Once | INTRAVENOUS | Status: AC | PRN
  Administered 2014-07-04: 20 mL via INTRAVENOUS

## 2014-07-06 ENCOUNTER — Other Ambulatory Visit: Payer: BC Managed Care – PPO

## 2014-07-09 ENCOUNTER — Other Ambulatory Visit (INDEPENDENT_AMBULATORY_CARE_PROVIDER_SITE_OTHER): Payer: Self-pay | Admitting: Surgery

## 2014-07-09 DIAGNOSIS — K432 Incisional hernia without obstruction or gangrene: Secondary | ICD-10-CM

## 2014-07-14 LAB — METANEPHRINES, URINE, 24 HOUR
Metaneph Total, Ur: 559 mcg/24 h (ref 224–832)
Metanephrines, Ur: 67 mcg/24 h — ABNORMAL LOW (ref 90–315)
Normetanephrine, 24H Ur: 492 mcg/24 h (ref 122–676)

## 2014-07-26 ENCOUNTER — Other Ambulatory Visit (INDEPENDENT_AMBULATORY_CARE_PROVIDER_SITE_OTHER): Payer: Self-pay | Admitting: General Surgery

## 2014-08-06 ENCOUNTER — Encounter (HOSPITAL_COMMUNITY): Payer: Self-pay | Admitting: Pharmacy Technician

## 2014-08-13 ENCOUNTER — Encounter (INDEPENDENT_AMBULATORY_CARE_PROVIDER_SITE_OTHER): Payer: BC Managed Care – PPO | Admitting: General Surgery

## 2014-08-14 ENCOUNTER — Encounter (HOSPITAL_COMMUNITY)
Admission: RE | Admit: 2014-08-14 | Discharge: 2014-08-14 | Disposition: A | Discharge status: Home / Self Care | Payer: BC Managed Care – PPO | Source: Ambulatory Visit | Attending: Anesthesiology | Admitting: Anesthesiology

## 2014-08-14 ENCOUNTER — Encounter (HOSPITAL_COMMUNITY)
Admission: RE | Admit: 2014-08-14 | Discharge: 2014-08-14 | Disposition: A | Discharge status: Home / Self Care | Payer: BC Managed Care – PPO | Source: Ambulatory Visit | Attending: General Surgery | Admitting: General Surgery

## 2014-08-14 ENCOUNTER — Encounter (HOSPITAL_COMMUNITY): Payer: Self-pay

## 2014-08-14 DIAGNOSIS — K760 Fatty (change of) liver, not elsewhere classified: Secondary | ICD-10-CM | POA: Insufficient documentation

## 2014-08-14 DIAGNOSIS — E785 Hyperlipidemia, unspecified: Secondary | ICD-10-CM | POA: Insufficient documentation

## 2014-08-14 DIAGNOSIS — I10 Essential (primary) hypertension: Secondary | ICD-10-CM | POA: Diagnosis not present

## 2014-08-14 DIAGNOSIS — E559 Vitamin D deficiency, unspecified: Secondary | ICD-10-CM | POA: Diagnosis not present

## 2014-08-14 DIAGNOSIS — G629 Polyneuropathy, unspecified: Secondary | ICD-10-CM | POA: Diagnosis not present

## 2014-08-14 DIAGNOSIS — K219 Gastro-esophageal reflux disease without esophagitis: Secondary | ICD-10-CM | POA: Diagnosis not present

## 2014-08-14 DIAGNOSIS — Z01818 Encounter for other preprocedural examination: Secondary | ICD-10-CM

## 2014-08-14 DIAGNOSIS — E278 Other specified disorders of adrenal gland: Secondary | ICD-10-CM | POA: Diagnosis not present

## 2014-08-14 DIAGNOSIS — Z0000 Encounter for general adult medical examination without abnormal findings: Secondary | ICD-10-CM | POA: Insufficient documentation

## 2014-08-14 DIAGNOSIS — M1990 Unspecified osteoarthritis, unspecified site: Secondary | ICD-10-CM | POA: Diagnosis not present

## 2014-08-14 HISTORY — DX: Unspecified osteoarthritis, unspecified site: M19.90

## 2014-08-14 HISTORY — DX: Pneumonia, unspecified organism: J18.9

## 2014-08-14 HISTORY — DX: Gastro-esophageal reflux disease without esophagitis: K21.9

## 2014-08-14 LAB — COMPREHENSIVE METABOLIC PANEL
ALBUMIN: 3.6 g/dL (ref 3.5–5.2)
ALT: 32 U/L (ref 0–35)
ANION GAP: 16 — AB (ref 5–15)
AST: 26 U/L (ref 0–37)
Alkaline Phosphatase: 102 U/L (ref 39–117)
BILIRUBIN TOTAL: 0.4 mg/dL (ref 0.3–1.2)
BUN: 23 mg/dL (ref 6–23)
CALCIUM: 10.5 mg/dL (ref 8.4–10.5)
CO2: 25 mEq/L (ref 19–32)
Chloride: 100 mEq/L (ref 96–112)
Creatinine, Ser: 0.78 mg/dL (ref 0.50–1.10)
GFR calc Af Amer: >90 mL/min (ref >90)
GFR, EST NON AFRICAN AMERICAN: 89 mL/min — AB (ref >90)
Glucose, Bld: 102 mg/dL — ABNORMAL HIGH (ref 70–99)
Potassium: 4 mEq/L (ref 3.7–5.3)
Sodium: 141 mEq/L (ref 137–147)
Total Protein: 7.3 g/dL (ref 6.0–8.3)

## 2014-08-14 LAB — CBC WITH DIFFERENTIAL/PLATELET
BASOS PCT: 0 % (ref 0–1)
Basophils Absolute: 0 10*3/uL (ref 0.0–0.1)
EOS PCT: 1 % (ref 0–5)
Eosinophils Absolute: 0.1 10*3/uL (ref 0.0–0.7)
HEMATOCRIT: 40.3 % (ref 36.0–46.0)
HEMOGLOBIN: 13.5 g/dL (ref 12.0–15.0)
Lymphocytes Relative: 25 % (ref 12–46)
Lymphs Abs: 2.4 10*3/uL (ref 0.7–4.0)
MCH: 28.2 pg (ref 26.0–34.0)
MCHC: 33.5 g/dL (ref 30.0–36.0)
MCV: 84.1 fL (ref 78.0–100.0)
MONO ABS: 1 10*3/uL (ref 0.1–1.0)
MONOS PCT: 10 % (ref 3–12)
Neutro Abs: 6.3 10*3/uL (ref 1.7–7.7)
Neutrophils Relative %: 64 % (ref 43–77)
Platelets: 224 10*3/uL (ref 150–400)
RBC: 4.79 MIL/uL (ref 3.87–5.11)
RDW: 15.1 % (ref 11.5–15.5)
WBC: 9.8 10*3/uL (ref 4.0–10.5)

## 2014-08-14 LAB — PROTIME-INR
INR: 0.96 (ref 0.00–1.49)
PROTHROMBIN TIME: 12.8 s (ref 11.6–15.2)

## 2014-08-14 LAB — URINALYSIS, ROUTINE W REFLEX MICROSCOPIC
BILIRUBIN URINE: NEGATIVE
Glucose, UA: NEGATIVE mg/dL
HGB URINE DIPSTICK: NEGATIVE
Ketones, ur: 15 mg/dL — AB
Leukocytes, UA: NEGATIVE
Nitrite: NEGATIVE
PROTEIN: NEGATIVE mg/dL
SPECIFIC GRAVITY, URINE: 1.026 (ref 1.005–1.030)
Urobilinogen, UA: 0.2 mg/dL (ref 0.0–1.0)
pH: 6.5 (ref 5.0–8.0)

## 2014-08-14 NOTE — Progress Notes (Signed)
Office called for verbage for hernias (for permit)

## 2014-08-14 NOTE — Pre-Procedure Instructions (Addendum)
Evelyn Campbell  08/14/2014   Your procedure is scheduled on:  08/20/14  Report to [facility] short stay admitting at 1000 AM.  Call this number if you have problems the morning of surgery: [phone]   Remember:   Do not eat food or drink liquids after midnight.   Take these medicines the morning of surgery with A SIP OF WATER: amlodipine,       Take all meds a ordered until day of surgery except as instructed below or per dr   Evelyn Campbell all herbel meds, nsaids (aleve,naproxen,advil,ibuprofen) 5 days prior to surgery(starting tomorrow-08/15/14) including vitamins ,aspirin,niacin,fish oil, probiotics   Do not wear jewelry, make-up or nail polish.  Do not wear lotions, powders, or perfumes. You may wear deodorant.  Do not shave 48 hours prior to surgery. Men may shave face and neck.  Do not bring valuables to the hospital.  [facility] is not responsible                  for any belongings or valuables.               Contacts, dentures or bridgework may not be worn into surgery.  Leave suitcase in the car. After surgery it may be brought to your room.  For patients admitted to the hospital, discharge time is determined by your                treatment team.               Patients discharged the day of surgery will not be allowed to drive  home.  Name and phone number of your driver:   Special Instructions:  Special Instructions: [place] - Preparing for Surgery  Before surgery, you can play an important role.  Because skin is not sterile, your skin needs to be as free of germs as possible.  You can reduce the number of germs on you skin by washing with CHG (chlorahexidine gluconate) soap before surgery.  CHG is an antiseptic cleaner which kills germs and bonds with the skin to continue killing germs even after washing.  Please DO NOT use if you have an allergy to CHG or antibacterial soaps.  If your skin becomes reddened/irritated stop using the CHG and inform your nurse when you  arrive at Short Stay.  Do not shave (including legs and underarms) for at least 48 hours prior to the first CHG shower.  You may shave your face.  Please follow these instructions carefully:   1.  Shower with CHG Soap the night before surgery and the morning of Surgery.  2.  If you choose to wash your hair, wash your hair first as usual with your normal shampoo.  3.  After you shampoo, rinse your hair and body thoroughly to remove the Shampoo.  4.  Use CHG as you would any other liquid soap.  You can apply chg directly  to the skin and wash gently with scrungie or a clean washcloth.  5.  Apply the CHG Soap to your body ONLY FROM THE NECK DOWN.  Do not use on open wounds or open sores.  Avoid contact with your eyes ears, mouth and genitals (private parts).  Wash genitals (private parts)       with your normal soap.  6.  Wash thoroughly, paying special attention to the area where your surgery will be performed.  7.  Thoroughly rinse your body with warm water from the neck down.  8.  DO NOT shower/wash with your normal soap after using and rinsing off the CHG Soap.  9.  Pat yourself dry with a clean towel.            10.  Wear clean pajamas.            11.  Place clean sheets on your bed the night of your first shower and do not sleep with pets.  Day of Surgery  Do not apply any lotions/deodorants the morning of surgery.  Please wear clean clothes to the hospital/surgery center.   Please read over the following fact sheets that you were given: Pain Booklet, Coughing and Deep Breathing and Surgical Site Infection Prevention

## 2014-08-15 ENCOUNTER — Other Ambulatory Visit (INDEPENDENT_AMBULATORY_CARE_PROVIDER_SITE_OTHER): Payer: Self-pay | Admitting: Surgery

## 2014-08-19 MED ORDER — CEFAZOLIN SODIUM-DEXTROSE 2-3 GM-% IV SOLR
2.0000 g | INTRAVENOUS | Status: AC
Start: 2014-08-20 — End: 2014-08-20
  Administered 2014-08-20: 2 g via INTRAVENOUS
  Filled 2014-08-19: qty 50

## 2014-08-19 NOTE — H&P (Signed)
Chief Complaint   Patient presents with   .  New Evaluation     hernia   HPI  Evelyn Campbell is a 58 y.o. female.  HPI  This is a pleasant female referred by Dr. Helane Rima for evaluation of incisional and bilateral inguinal hernias. The patient has been actually having upper abdominal pain for several years. She has noticed a bulge around her umbilicus. She more recently has also been noting bulges or groins. The right groin has become painful at times. The bulges will reduce after lying down at bedtime. Because of her findings on physical examination, she was sent to the emergency department and had a CAT scan of the abdomen and pelvis. These showed a chronically incarcerated hernias at the umbilicus as well as in the groins with no bowel involvement. Incidentally, she was found to have a large left adrenal mass as well as a small mass on the left kidney. MRI has been ordered to evaluate these lesions. She has no nausea or vomiting or obstructive symptoms. She is otherwise without complaints.  Past Medical History   Diagnosis  Date   .  Hypertension    .  Hyperlipidemia    .  Fatty liver    .  Vitamin D deficiency    .  Peripheral neuropathy    History reviewed. No pertinent past surgical history.  Family History   Problem  Relation  Age of Onset   .  Heart failure  Father    Social History  History   Substance Use Topics   .  Smoking status:  Never Smoker   .  Smokeless tobacco:  Not on file   .  Alcohol Use:  No    Allergies   Allergen  Reactions   .  Celebrex [Celecoxib]  Diarrhea   .  Ibuprofen      unknown   .  Lipitor [Atorvastatin]      myalgia   .  Meloxicam  Diarrhea     myalgia   .  Statins      unknown    Current Outpatient Prescriptions   Medication  Sig  Dispense  Refill   .  amLODipine (NORVASC) 5 MG tablet  Take 5 mg by mouth daily.     Marland Kitchen  aspirin 325 MG tablet  Take 325 mg by mouth at bedtime.     Marland Kitchen  atenolol-chlorthalidone (TENORETIC) 100-25 MG per tablet   Take 1 tablet by mouth daily.     .  calcium carbonate (OS-CAL) 600 MG TABS tablet  Take 600 mg by mouth 2 (two) times daily with a meal.     .  Cholecalciferol (VITAMIN D3) 1000 UNITS CAPS  Take by mouth.     .  docusate sodium (STOOL SOFTENER) 100 MG capsule  Take 100 mg by mouth 2 (two) times daily.     Marland Kitchen  FIBER COMPLETE PO  Take by mouth.     .  gabapentin (NEURONTIN) 300 MG capsule  Take 900 mg by mouth at bedtime.     Marland Kitchen  lisinopril (PRINIVIL,ZESTRIL) 20 MG tablet  Take 20 mg by mouth daily.     .  Multiple Vitamin (MULTIVITAMIN) capsule  Take 1 capsule by mouth daily.     .  niacin (NIASPAN) 1000 MG CR tablet  Take 2,000 mg by mouth at bedtime.     Marland Kitchen  omega-3 acid ethyl esters (LOVAZA) 1 G capsule  Take 2 g by mouth 2 (two) times  daily.     .  Probiotic Product (PROBIOTIC PEARLS PO)  Take by mouth.     Marland Kitchen  HYDROcodone-acetaminophen (NORCO/VICODIN) 5-325 MG per tablet  Take 1-2 tablets by mouth every 6 hours as needed for pain.  15 tablet  0    No current facility-administered medications for this visit.   Review of Systems  Review of Systems  Constitutional: Negative for fever, chills and unexpected weight change.  HENT: Negative for congestion, hearing loss, sore throat, trouble swallowing and voice change.  Eyes: Negative for visual disturbance.  Respiratory: Negative for cough and wheezing.  Cardiovascular: Negative for chest pain, palpitations and leg swelling.  Gastrointestinal: Positive for abdominal pain. Negative for nausea, vomiting, diarrhea, constipation, blood in stool, abdominal distention and anal bleeding.  Genitourinary: Negative for hematuria, vaginal bleeding and difficulty urinating.  Musculoskeletal: Negative for arthralgias.  Skin: Negative for rash and wound.  Neurological: Negative for seizures, syncope and headaches.  Hematological: Negative for adenopathy. Does not bruise/bleed easily.  Psychiatric/Behavioral: Negative for confusion.  Blood pressure 112/64,  pulse 79, temperature 98.7 F (37.1 C), temperature source Oral, height 5\' 6"  (1.676 m), weight 225 lb (102.059 kg).  Physical Exam  Physical Exam  Constitutional: She is oriented to person, place, and time. She appears well-developed and well-nourished. No distress.  HENT:  Head: Normocephalic and atraumatic.  Right Ear: External ear normal.  Left Ear: External ear normal.  Nose: Nose normal.  Mouth/Throat: Oropharynx is clear and moist. No oropharyngeal exudate.  Eyes: Conjunctivae are normal. Pupils are equal, round, and reactive to light. Right eye exhibits no discharge. Left eye exhibits no discharge. No scleral icterus.  Neck: Normal range of motion. Neck supple. No tracheal deviation present.  Cardiovascular: Normal rate, regular rhythm, normal heart sounds and intact distal pulses.  No murmur heard.  Pulmonary/Chest: Effort normal and breath sounds normal. No respiratory distress. She has no wheezes.  Abdominal: Soft. Bowel sounds are normal. There is no tenderness.  There is a chronically incarcerated hernia around the umbilicus. There are bilateral inguinal hernias. Today, making her hernias are nontender and I can reduce them  Musculoskeletal: Normal range of motion. She exhibits no edema and no tenderness.  Except for the right foot which is in an orthopedic boot  Neurological: She is alert and oriented to person, place, and time.  Skin: Skin is warm and dry. No rash noted. No erythema.  Psychiatric: Her behavior is normal.  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 She will need to get the MRI first to make sure she does not have the need for surgical intervention of the adrenal mass or renal lesion before proceeding with surgery. I discussed the reasons  for this the patient and her husband in detail. I discussed laparoscopic versus open hernia repair with mesh. My plan would be to proceed with laparoscopic repair of all the hernias with mesh. If something needs to be done with the adrenal or renal lesion by urology, this can be performed at the same time as the hernia repair. At this point, I feel it is reasonable that she may work without limits. I will call her back after the MRI is complete to discuss things further and proceed accordingly.   Addendum:  Dr. Nicholas Lose is planning a lap assisted adrenalectomy.  I will be proceeding with a laparoscopic bilateral inguinal and ventral/incisional hernia repair with mesh.  I discussed the risks which include but are not limited to bleeding, infection, injury to surrounding structures, need to convert to an open procedure, etc.  She agrees to proceed.

## 2014-08-20 ENCOUNTER — Encounter (HOSPITAL_COMMUNITY)
Admission: RE | Disposition: A | Discharge status: Home / Self Care | Payer: Self-pay | Source: Ambulatory Visit | Attending: General Surgery

## 2014-08-20 ENCOUNTER — Encounter (HOSPITAL_COMMUNITY): Payer: Self-pay | Admitting: *Deleted

## 2014-08-20 ENCOUNTER — Inpatient Hospital Stay (HOSPITAL_COMMUNITY)
Admission: RE | Admit: 2014-08-20 | Discharge: 2014-08-24 | DRG: 352 | Disposition: A | Discharge status: Home / Self Care | Payer: BC Managed Care – PPO | Source: Ambulatory Visit | Attending: General Surgery | Admitting: General Surgery

## 2014-08-20 ENCOUNTER — Encounter (HOSPITAL_COMMUNITY): Payer: BC Managed Care – PPO | Admitting: Anesthesiology

## 2014-08-20 ENCOUNTER — Inpatient Hospital Stay (HOSPITAL_COMMUNITY): Payer: BC Managed Care – PPO | Admitting: Anesthesiology

## 2014-08-20 DIAGNOSIS — N2889 Other specified disorders of kidney and ureter: Secondary | ICD-10-CM | POA: Diagnosis present

## 2014-08-20 DIAGNOSIS — K4020 Bilateral inguinal hernia, without obstruction or gangrene, not specified as recurrent: Secondary | ICD-10-CM | POA: Diagnosis present

## 2014-08-20 DIAGNOSIS — E279 Disorder of adrenal gland, unspecified: Secondary | ICD-10-CM | POA: Diagnosis present

## 2014-08-20 DIAGNOSIS — K432 Incisional hernia without obstruction or gangrene: Secondary | ICD-10-CM | POA: Diagnosis present

## 2014-08-20 DIAGNOSIS — E041 Nontoxic single thyroid nodule: Secondary | ICD-10-CM | POA: Diagnosis present

## 2014-08-20 DIAGNOSIS — E785 Hyperlipidemia, unspecified: Secondary | ICD-10-CM | POA: Diagnosis present

## 2014-08-20 DIAGNOSIS — E780 Pure hypercholesterolemia: Secondary | ICD-10-CM | POA: Diagnosis present

## 2014-08-20 DIAGNOSIS — Z888 Allergy status to other drugs, medicaments and biological substances status: Secondary | ICD-10-CM | POA: Diagnosis not present

## 2014-08-20 DIAGNOSIS — Z886 Allergy status to analgesic agent status: Secondary | ICD-10-CM

## 2014-08-20 DIAGNOSIS — I10 Essential (primary) hypertension: Secondary | ICD-10-CM | POA: Diagnosis present

## 2014-08-20 DIAGNOSIS — K219 Gastro-esophageal reflux disease without esophagitis: Secondary | ICD-10-CM | POA: Diagnosis present

## 2014-08-20 DIAGNOSIS — Z8249 Family history of ischemic heart disease and other diseases of the circulatory system: Secondary | ICD-10-CM

## 2014-08-20 DIAGNOSIS — E278 Other specified disorders of adrenal gland: Secondary | ICD-10-CM | POA: Diagnosis present

## 2014-08-20 HISTORY — PX: INGUINAL HERNIA REPAIR: SHX194

## 2014-08-20 HISTORY — PX: INSERTION OF MESH: SHX5868

## 2014-08-20 HISTORY — PX: PANCREATECTOMY: SHX5261

## 2014-08-20 HISTORY — PX: UMBILICAL HERNIA REPAIR: SHX196

## 2014-08-20 LAB — CBC
HCT: 36.8 % (ref 36.0–46.0)
HEMOGLOBIN: 12.5 g/dL (ref 12.0–15.0)
MCH: 29.1 pg (ref 26.0–34.0)
MCHC: 34 g/dL (ref 30.0–36.0)
MCV: 85.6 fL (ref 78.0–100.0)
PLATELETS: 175 10*3/uL (ref 150–400)
RBC: 4.3 MIL/uL (ref 3.87–5.11)
RDW: 15 % (ref 11.5–15.5)
WBC: 18.2 10*3/uL — AB (ref 4.0–10.5)

## 2014-08-20 LAB — CREATININE, SERUM
Creatinine, Ser: 0.68 mg/dL (ref 0.50–1.10)
GFR calc non Af Amer: >90 mL/min (ref >90)

## 2014-08-20 SURGERY — PANCREATECTOMY, LAPAROSCOPIC
Anesthesia: General

## 2014-08-20 MED ORDER — KETOROLAC TROMETHAMINE 15 MG/ML IJ SOLN
15.0000 mg | Freq: Four times a day (QID) | INTRAMUSCULAR | Status: DC | PRN
  Administered 2014-08-21: 15 mg via INTRAVENOUS
  Filled 2014-08-20: qty 1

## 2014-08-20 MED ORDER — PROPOFOL 10 MG/ML IV BOLUS
INTRAVENOUS | Status: AC
  Filled 2014-08-20: qty 20

## 2014-08-20 MED ORDER — FENTANYL CITRATE 0.05 MG/ML IJ SOLN
INTRAMUSCULAR | Status: AC
  Filled 2014-08-20: qty 5

## 2014-08-20 MED ORDER — DOCUSATE SODIUM 100 MG PO CAPS
100.0000 mg | ORAL_CAPSULE | Freq: Two times a day (BID) | ORAL | Status: DC
  Administered 2014-08-20 – 2014-08-23 (×7): 100 mg via ORAL
  Filled 2014-08-20 (×7): qty 1

## 2014-08-20 MED ORDER — MIDAZOLAM HCL 5 MG/5ML IJ SOLN
INTRAMUSCULAR | Status: DC | PRN
  Administered 2014-08-20: 2 mg via INTRAVENOUS

## 2014-08-20 MED ORDER — MORPHINE SULFATE 2 MG/ML IJ SOLN: 1.0000 mg | INTRAMUSCULAR | Status: DC | PRN

## 2014-08-20 MED ORDER — SODIUM CHLORIDE 0.9 % IR SOLN
Status: DC | PRN
  Administered 2014-08-20: 1000 mL

## 2014-08-20 MED ORDER — EPHEDRINE SULFATE 50 MG/ML IJ SOLN
INTRAMUSCULAR | Status: DC | PRN
  Administered 2014-08-20 (×5): 10 mg via INTRAVENOUS

## 2014-08-20 MED ORDER — FENTANYL CITRATE 0.05 MG/ML IJ SOLN
INTRAMUSCULAR | Status: DC | PRN
  Administered 2014-08-20: 150 ug via INTRAVENOUS
  Administered 2014-08-20: 250 ug via INTRAVENOUS

## 2014-08-20 MED ORDER — PHENYLEPHRINE 40 MCG/ML (10ML) SYRINGE FOR IV PUSH (FOR BLOOD PRESSURE SUPPORT)
PREFILLED_SYRINGE | INTRAVENOUS | Status: AC
  Filled 2014-08-20: qty 10

## 2014-08-20 MED ORDER — SODIUM CHLORIDE 0.9 % IJ SOLN
INTRAMUSCULAR | Status: AC
  Filled 2014-08-20: qty 10

## 2014-08-20 MED ORDER — ONDANSETRON HCL 4 MG/2ML IJ SOLN
INTRAMUSCULAR | Status: DC | PRN
  Administered 2014-08-20: 4 mg via INTRAVENOUS

## 2014-08-20 MED ORDER — ENOXAPARIN SODIUM 40 MG/0.4ML ~~LOC~~ SOLN
40.0000 mg | SUBCUTANEOUS | Status: DC
  Administered 2014-08-21 – 2014-08-23 (×3): 40 mg via SUBCUTANEOUS
  Filled 2014-08-20 (×4): qty 0.4

## 2014-08-20 MED ORDER — KCL IN DEXTROSE-NACL 20-5-0.45 MEQ/L-%-% IV SOLN
INTRAVENOUS | Status: DC
  Administered 2014-08-20: 20:00:00 via INTRAVENOUS
  Filled 2014-08-20 (×3): qty 1000

## 2014-08-20 MED ORDER — SCOPOLAMINE 1 MG/3DAYS TD PT72
MEDICATED_PATCH | TRANSDERMAL | Status: AC
  Filled 2014-08-20: qty 1

## 2014-08-20 MED ORDER — OXYCODONE HCL 5 MG PO TABS: 5.0000 mg | ORAL_TABLET | Freq: Once | ORAL | Status: DC | PRN

## 2014-08-20 MED ORDER — LISINOPRIL 20 MG PO TABS
20.0000 mg | ORAL_TABLET | Freq: Every day | ORAL | Status: DC
  Administered 2014-08-22 – 2014-08-24 (×3): 20 mg via ORAL
  Filled 2014-08-20 (×4): qty 1

## 2014-08-20 MED ORDER — BUPIVACAINE-EPINEPHRINE (PF) 0.25% -1:200000 IJ SOLN
INTRAMUSCULAR | Status: DC | PRN
  Administered 2014-08-20: 16:00:00

## 2014-08-20 MED ORDER — GABAPENTIN 300 MG PO CAPS
900.0000 mg | ORAL_CAPSULE | Freq: Every day | ORAL | Status: DC
  Administered 2014-08-20 – 2014-08-23 (×4): 900 mg via ORAL
  Filled 2014-08-20 (×6): qty 3

## 2014-08-20 MED ORDER — ONDANSETRON HCL 4 MG PO TABS: 4.0000 mg | ORAL_TABLET | Freq: Four times a day (QID) | ORAL | Status: DC | PRN

## 2014-08-20 MED ORDER — HYDROMORPHONE HCL 1 MG/ML IJ SOLN
INTRAMUSCULAR | Status: AC
  Filled 2014-08-20: qty 1

## 2014-08-20 MED ORDER — NIACIN ER (ANTIHYPERLIPIDEMIC) 1000 MG PO TBCR: 2000.0000 mg | EXTENDED_RELEASE_TABLET | Freq: Every day | ORAL | Status: DC

## 2014-08-20 MED ORDER — CHLORTHALIDONE 25 MG PO TABS
25.0000 mg | ORAL_TABLET | Freq: Every day | ORAL | Status: DC
  Administered 2014-08-22 – 2014-08-23 (×2): 25 mg via ORAL
  Filled 2014-08-20 (×4): qty 1

## 2014-08-20 MED ORDER — SCOPOLAMINE 1 MG/3DAYS TD PT72
MEDICATED_PATCH | TRANSDERMAL | Status: DC | PRN
  Administered 2014-08-20: 1 via TRANSDERMAL

## 2014-08-20 MED ORDER — NEOSTIGMINE METHYLSULFATE 10 MG/10ML IV SOLN
INTRAVENOUS | Status: DC | PRN
  Administered 2014-08-20: 3 mg via INTRAVENOUS

## 2014-08-20 MED ORDER — MIDAZOLAM HCL 2 MG/2ML IJ SOLN
INTRAMUSCULAR | Status: AC
  Filled 2014-08-20: qty 2

## 2014-08-20 MED ORDER — EPHEDRINE SULFATE 50 MG/ML IJ SOLN
INTRAMUSCULAR | Status: AC
  Filled 2014-08-20: qty 1

## 2014-08-20 MED ORDER — PROPOFOL 10 MG/ML IV BOLUS
INTRAVENOUS | Status: DC | PRN
  Administered 2014-08-20: 200 mg via INTRAVENOUS

## 2014-08-20 MED ORDER — 0.9 % SODIUM CHLORIDE (POUR BTL) OPTIME
TOPICAL | Status: DC | PRN
  Administered 2014-08-20: 2000 mL

## 2014-08-20 MED ORDER — LIDOCAINE HCL (PF) 1 % IJ SOLN
INTRAMUSCULAR | Status: AC
  Filled 2014-08-20: qty 30

## 2014-08-20 MED ORDER — DEXAMETHASONE SODIUM PHOSPHATE 10 MG/ML IJ SOLN
INTRAMUSCULAR | Status: AC
  Filled 2014-08-20: qty 1

## 2014-08-20 MED ORDER — LACTATED RINGERS IV SOLN
INTRAVENOUS | Status: DC
  Administered 2014-08-20 (×3): via INTRAVENOUS

## 2014-08-20 MED ORDER — OXYCODONE HCL 5 MG/5ML PO SOLN: 5.0000 mg | Freq: Once | ORAL | Status: DC | PRN

## 2014-08-20 MED ORDER — NIACIN ER 500 MG PO CPCR
2000.0000 mg | ORAL_CAPSULE | Freq: Every day | ORAL | Status: DC
  Administered 2014-08-20 – 2014-08-23 (×4): 2000 mg via ORAL
  Filled 2014-08-20 (×5): qty 4

## 2014-08-20 MED ORDER — HYDROMORPHONE HCL 1 MG/ML IJ SOLN
0.2500 mg | INTRAMUSCULAR | Status: DC | PRN
  Administered 2014-08-20: 0.5 mg via INTRAVENOUS

## 2014-08-20 MED ORDER — ROCURONIUM BROMIDE 100 MG/10ML IV SOLN
INTRAVENOUS | Status: DC | PRN
  Administered 2014-08-20: 50 mg via INTRAVENOUS

## 2014-08-20 MED ORDER — ATENOLOL 100 MG PO TABS
100.0000 mg | ORAL_TABLET | Freq: Every day | ORAL | Status: DC
  Administered 2014-08-22 – 2014-08-23 (×2): 100 mg via ORAL
  Filled 2014-08-20 (×4): qty 1

## 2014-08-20 MED ORDER — GLYCOPYRROLATE 0.2 MG/ML IJ SOLN
INTRAMUSCULAR | Status: DC | PRN
  Administered 2014-08-20: 0.4 mg via INTRAVENOUS

## 2014-08-20 MED ORDER — LIDOCAINE HCL (CARDIAC) 20 MG/ML IV SOLN
INTRAVENOUS | Status: DC | PRN
  Administered 2014-08-20: 30 mg via INTRAVENOUS

## 2014-08-20 MED ORDER — DEXAMETHASONE SODIUM PHOSPHATE 10 MG/ML IJ SOLN
INTRAMUSCULAR | Status: DC | PRN
  Administered 2014-08-20: 10 mg via INTRAVENOUS

## 2014-08-20 MED ORDER — BUPIVACAINE-EPINEPHRINE (PF) 0.25% -1:200000 IJ SOLN
INTRAMUSCULAR | Status: AC
  Filled 2014-08-20: qty 30

## 2014-08-20 MED ORDER — ONDANSETRON HCL 4 MG/2ML IJ SOLN: 4.0000 mg | Freq: Four times a day (QID) | INTRAMUSCULAR | Status: DC | PRN

## 2014-08-20 MED ORDER — AMLODIPINE BESYLATE 5 MG PO TABS
5.0000 mg | ORAL_TABLET | Freq: Every day | ORAL | Status: DC
  Administered 2014-08-22 – 2014-08-24 (×3): 5 mg via ORAL
  Filled 2014-08-20 (×4): qty 1

## 2014-08-20 MED ORDER — KETOROLAC TROMETHAMINE 15 MG/ML IJ SOLN
15.0000 mg | Freq: Four times a day (QID) | INTRAMUSCULAR | Status: AC
  Administered 2014-08-20 – 2014-08-21 (×3): 15 mg via INTRAVENOUS
  Filled 2014-08-20 (×3): qty 1

## 2014-08-20 MED ORDER — PHENYLEPHRINE HCL 10 MG/ML IJ SOLN
INTRAMUSCULAR | Status: DC | PRN
  Administered 2014-08-20 (×3): 80 ug via INTRAVENOUS

## 2014-08-20 MED ORDER — CEFAZOLIN SODIUM 1-5 GM-% IV SOLN
1.0000 g | Freq: Four times a day (QID) | INTRAVENOUS | Status: AC
  Administered 2014-08-20 – 2014-08-21 (×3): 1 g via INTRAVENOUS
  Filled 2014-08-20 (×3): qty 50

## 2014-08-20 MED ORDER — OXYCODONE-ACETAMINOPHEN 5-325 MG PO TABS
1.0000 | ORAL_TABLET | ORAL | Status: DC | PRN
  Administered 2014-08-21 – 2014-08-23 (×5): 1 via ORAL
  Administered 2014-08-23 – 2014-08-24 (×4): 2 via ORAL
  Filled 2014-08-20 (×2): qty 2
  Filled 2014-08-20 (×2): qty 1
  Filled 2014-08-20 (×2): qty 2
  Filled 2014-08-20 (×3): qty 1

## 2014-08-20 MED ORDER — VECURONIUM BROMIDE 10 MG IV SOLR
INTRAVENOUS | Status: DC | PRN
  Administered 2014-08-20 (×3): 2 mg via INTRAVENOUS

## 2014-08-20 MED ORDER — ATENOLOL-CHLORTHALIDONE 100-25 MG PO TABS: 1.0000 | ORAL_TABLET | Freq: Every day | ORAL | Status: DC

## 2014-08-20 SURGICAL SUPPLY — 132 items
APPLIER CLIP 5 13 M/L LIGAMAX5 (MISCELLANEOUS) ×8
APPLIER CLIP LOGIC TI 5 (MISCELLANEOUS) IMPLANT
BAG BILE T-TUBES STRL (MISCELLANEOUS) IMPLANT
BANDAGE ADH SHEER 1  50/CT (GAUZE/BANDAGES/DRESSINGS) IMPLANT
BENZOIN TINCTURE PRP APPL 2/3 (GAUZE/BANDAGES/DRESSINGS) ×8 IMPLANT
BLADE SURG 10 STRL SS (BLADE) ×8 IMPLANT
BLADE SURG 15 STRL LF DISP TIS (BLADE) ×2 IMPLANT
BLADE SURG 15 STRL SS (BLADE) ×2
BLADE SURG ROTATE 9660 (MISCELLANEOUS) ×4 IMPLANT
CANISTER SUCTION 2500CC (MISCELLANEOUS) ×4 IMPLANT
CATH KIT ON Q 5IN DUAL SLV (PAIN MANAGEMENT) IMPLANT
CATH KIT ON Q 7.5IN SLV (PAIN MANAGEMENT) IMPLANT
CATH ROBINSON RED A/P 14FR (CATHETERS) IMPLANT
CATH ROBINSON RED A/P 16FR (CATHETERS) IMPLANT
CHLORAPREP W/TINT 26ML (MISCELLANEOUS) ×8 IMPLANT
CLIP APPLIE 5 13 M/L LIGAMAX5 (MISCELLANEOUS) ×4 IMPLANT
CLIP LIGATING HEM O LOK PURPLE (MISCELLANEOUS) IMPLANT
CLIP LIGATING HEMO O LOK GREEN (MISCELLANEOUS) IMPLANT
CLIP LIGATING HEMOLOK MED (MISCELLANEOUS) IMPLANT
CLIP TI LARGE 6 (CLIP) IMPLANT
CLIP TI MEDIUM 24 (CLIP) IMPLANT
CLIP TI WIDE RED SMALL 24 (CLIP) IMPLANT
CLOSURE WOUND 1/2 X4 (GAUZE/BANDAGES/DRESSINGS) ×2
COVER SURGICAL LIGHT HANDLE (MISCELLANEOUS) ×8 IMPLANT
DECANTER SPIKE VIAL GLASS SM (MISCELLANEOUS) ×4 IMPLANT
DEVICE SECURE STRAP 25 ABSORB (INSTRUMENTS) ×8 IMPLANT
DEVICE TROCAR PUNCTURE CLOSURE (ENDOMECHANICALS) ×4 IMPLANT
DISSECT BALLN SPACEMKR + OVL (BALLOONS) ×4
DISSECTOR BALLN SPACEMKR + OVL (BALLOONS) ×2 IMPLANT
DISSECTOR BLUNT TIP ENDO 5MM (MISCELLANEOUS) IMPLANT
DRAIN CHANNEL 19F RND (DRAIN) IMPLANT
DRAIN PENROSE 1/2X36 STERILE (WOUND CARE) IMPLANT
DRAPE LAPAROSCOPIC ABDOMINAL (DRAPES) ×8 IMPLANT
DRAPE PED LAPAROTOMY (DRAPES) IMPLANT
DRAPE UTILITY 15X26 W/TAPE STR (DRAPE) ×16 IMPLANT
DRAPE WARM FLUID 44X44 (DRAPE) ×4 IMPLANT
DRSG COVADERM 4X10 (GAUZE/BANDAGES/DRESSINGS) IMPLANT
DRSG COVADERM 4X14 (GAUZE/BANDAGES/DRESSINGS) IMPLANT
DRSG TEGADERM 2-3/8X2-3/4 SM (GAUZE/BANDAGES/DRESSINGS) ×28 IMPLANT
DRSG TEGADERM 4X4.75 (GAUZE/BANDAGES/DRESSINGS) ×4 IMPLANT
ELECT BLADE 6.5 EXT (BLADE) IMPLANT
ELECT CAUTERY BLADE 6.4 (BLADE) ×12 IMPLANT
ELECT REM PT RETURN 9FT ADLT (ELECTROSURGICAL) ×8
ELECTRODE REM PT RTRN 9FT ADLT (ELECTROSURGICAL) ×4 IMPLANT
EVACUATOR SILICONE 100CC (DRAIN) IMPLANT
GAUZE SPONGE 2X2 8PLY STRL LF (GAUZE/BANDAGES/DRESSINGS) ×4 IMPLANT
GAUZE SPONGE 4X4 12PLY STRL (GAUZE/BANDAGES/DRESSINGS) IMPLANT
GEL PDS (MISCELLANEOUS) IMPLANT
GLOVE BIO SURGEON STRL SZ 6 (GLOVE) ×4 IMPLANT
GLOVE BIO SURGEON STRL SZ 6.5 (GLOVE) ×3 IMPLANT
GLOVE BIO SURGEONS STRL SZ 6.5 (GLOVE) ×1
GLOVE BIOGEL PI IND STRL 6.5 (GLOVE) ×2 IMPLANT
GLOVE BIOGEL PI IND STRL 7.0 (GLOVE) ×2 IMPLANT
GLOVE BIOGEL PI IND STRL 7.5 (GLOVE) ×2 IMPLANT
GLOVE BIOGEL PI IND STRL 8.5 (GLOVE) ×2 IMPLANT
GLOVE BIOGEL PI INDICATOR 6.5 (GLOVE) ×2
GLOVE BIOGEL PI INDICATOR 7.0 (GLOVE) ×2
GLOVE BIOGEL PI INDICATOR 7.5 (GLOVE) ×2
GLOVE BIOGEL PI INDICATOR 8.5 (GLOVE) ×2
GLOVE ECLIPSE 7.5 STRL STRAW (GLOVE) ×4 IMPLANT
GLOVE SURG SIGNA 7.5 PF LTX (GLOVE) ×4 IMPLANT
GLOVE SURG SS PI 7.0 STRL IVOR (GLOVE) ×4 IMPLANT
GOWN STRL REUS W/ TWL LRG LVL3 (GOWN DISPOSABLE) ×8 IMPLANT
GOWN STRL REUS W/ TWL XL LVL3 (GOWN DISPOSABLE) ×2 IMPLANT
GOWN STRL REUS W/TWL 2XL LVL3 (GOWN DISPOSABLE) ×4 IMPLANT
GOWN STRL REUS W/TWL LRG LVL3 (GOWN DISPOSABLE) ×8
GOWN STRL REUS W/TWL XL LVL3 (GOWN DISPOSABLE) ×2
KIT BASIN OR (CUSTOM PROCEDURE TRAY) ×8 IMPLANT
KIT ROOM TURNOVER OR (KITS) ×8 IMPLANT
MARKER SKIN DUAL TIP RULER LAB (MISCELLANEOUS) ×4 IMPLANT
MESH 3DMAX 4X6 LT LRG (Mesh General) ×4 IMPLANT
MESH 3DMAX 4X6 RT LRG (Mesh General) ×4 IMPLANT
MESH VENTRALEX ST 8CM LRG (Mesh General) ×4 IMPLANT
NEEDLE HYPO 25GX1X1/2 BEV (NEEDLE) ×4 IMPLANT
NEEDLE INSUFFLATION 14GA 120MM (NEEDLE) IMPLANT
NEEDLE SPNL 22GX3.5 QUINCKE BK (NEEDLE) ×4 IMPLANT
NS IRRIG 1000ML POUR BTL (IV SOLUTION) ×8 IMPLANT
PACK LAPAROSCOPIC ABD 0248 (SET/KITS/TRAYS/PACK) ×4 IMPLANT
PACK SURGICAL SETUP 50X90 (CUSTOM PROCEDURE TRAY) ×4 IMPLANT
PAD ARMBOARD 7.5X6 YLW CONV (MISCELLANEOUS) ×12 IMPLANT
PENCIL BUTTON HOLSTER BLD 10FT (ELECTRODE) ×4 IMPLANT
RELOAD WHITE ECR60W (STAPLE) IMPLANT
SCALPEL HARMONIC ACE (MISCELLANEOUS) ×4 IMPLANT
SCISSORS HARMONIC WAVE 18CM (INSTRUMENTS) IMPLANT
SET IRRIG TUBING LAPAROSCOPIC (IRRIGATION / IRRIGATOR) ×8 IMPLANT
SET TROCAR LAP APPLE-HUNT 5MM (ENDOMECHANICALS) ×4 IMPLANT
SLEEVE ENDOPATH XCEL 5M (ENDOMECHANICALS) ×12 IMPLANT
SLEEVE SURGEON STRL (DRAPES) ×8 IMPLANT
SPONGE GAUZE 2X2 STER 10/PKG (GAUZE/BANDAGES/DRESSINGS) ×4
SPONGE INTESTINAL PEANUT (DISPOSABLE) IMPLANT
SPONGE LAP 18X18 X RAY DECT (DISPOSABLE) ×4 IMPLANT
STAPLE ECHEON FLEX 60 POW ENDO (STAPLE) ×4 IMPLANT
STAPLER VISISTAT 35W (STAPLE) ×4 IMPLANT
STRIP CLOSURE SKIN 1/2X4 (GAUZE/BANDAGES/DRESSINGS) ×6 IMPLANT
STRIP PERI DRY VERITAS 60 (STAPLE) IMPLANT
SUT ETHILON 2 0 FS 18 (SUTURE) IMPLANT
SUT MNCRL AB 4-0 PS2 18 (SUTURE) ×8 IMPLANT
SUT NOVA NAB DX-16 0-1 5-0 T12 (SUTURE) ×12 IMPLANT
SUT PDS AB 1 TP1 96 (SUTURE) IMPLANT
SUT PDS AB 3-0 SH 27 (SUTURE) IMPLANT
SUT PDS II 0 TP-1 LOOPED 60 (SUTURE) IMPLANT
SUT PROLENE 1 CT (SUTURE) ×4 IMPLANT
SUT PROLENE 1 XLH 60 (SUTURE) ×4 IMPLANT
SUT PROLENE 3 0 SH 48 (SUTURE) IMPLANT
SUT PROLENE 4 0 RB 1 (SUTURE)
SUT PROLENE 4 0 SH DA (SUTURE) IMPLANT
SUT PROLENE 4-0 RB1 .5 CRCL 36 (SUTURE) IMPLANT
SUT PROLENE 5 0 C1 (SUTURE) IMPLANT
SUT SILK 2 0 SH CR/8 (SUTURE) ×4 IMPLANT
SUT SILK 2 0 TIES 10X30 (SUTURE) ×4 IMPLANT
SUT SILK 3 0 SH CR/8 (SUTURE) ×4 IMPLANT
SUT SILK 3 0 TIES 10X30 (SUTURE) ×4 IMPLANT
SUT VIC AB 3-0 SH 27 (SUTURE) ×2
SUT VIC AB 3-0 SH 27X BRD (SUTURE) ×2 IMPLANT
SYR CONTROL 10ML LL (SYRINGE) ×4 IMPLANT
SYS LAPSCP GELPORT 120MM (MISCELLANEOUS) ×4
SYSTEM LAPSCP GELPORT 120MM (MISCELLANEOUS) ×2 IMPLANT
TIP RIGID 35CM EVICEL (HEMOSTASIS) IMPLANT
TOWEL OR 17X24 6PK STRL BLUE (TOWEL DISPOSABLE) ×8 IMPLANT
TOWEL OR 17X26 10 PK STRL BLUE (TOWEL DISPOSABLE) ×8 IMPLANT
TRAY FOLEY CATH 14FRSI W/METER (CATHETERS) IMPLANT
TRAY FOLEY CATH 16FRSI W/METER (SET/KITS/TRAYS/PACK) ×4 IMPLANT
TRAY LAPAROSCOPIC (CUSTOM PROCEDURE TRAY) ×4 IMPLANT
TROCAR XCEL 12X100 BLDLESS (ENDOMECHANICALS) ×4 IMPLANT
TROCAR XCEL BLUNT TIP 100MML (ENDOMECHANICALS) IMPLANT
TROCAR XCEL NON-BLD 5MMX100MML (ENDOMECHANICALS) ×16 IMPLANT
TUBE CONNECTING 12'X1/4 (SUCTIONS) ×1
TUBE CONNECTING 12X1/4 (SUCTIONS) ×3 IMPLANT
TUBING FILTER THERMOFLATOR (ELECTROSURGICAL) IMPLANT
TUBING INSUFFLATION (TUBING) ×8 IMPLANT
TUNNELER SHEATH ON-Q 11GX8 DSP (PAIN MANAGEMENT) IMPLANT
YANKAUER SUCT BULB TIP NO VENT (SUCTIONS) ×4 IMPLANT

## 2014-08-20 NOTE — H&P (Signed)
Evelyn Campbell 07/26/2014 10:30 AM Location: [facility] Patient #: [id number] DOB: 05/13/1956 Married / Language: English / Race: White Female  History of Present Illness Stark Klein MD; 07/27/2014 2:45 PM) Patient words: Hernia.  The patient is a 58 year old female who presents with a thyroid nodule. The patient was referred by a specialty consultant (Dr. Ninfa Linden). Initial presentation was 1 month(s) ago. She presented for bilateral inguinal hernias to Dr. Ninfa Linden. She also was found to have a supraumbilical hernia in a lap chole port site. She received a CT scan to evaluate the hernias. At that point, she was found to have an adrenal mass that was over 5 cm. She subsequently underwent MRI for evaluation. 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 She has had some intermittent crampy abdominal pain. She denies issues with sweats, headaches, muscle cramping, confusion. She is mainly complaining of problems with her groin pain on the left.    Other Problems (Dahionnarah [place]les[place], RMA; 07/26/2014 10:30 AM) Arthritis Gastroesophageal Reflux Disease High blood pressure Hypercholesterolemia Inguinal Hernia  Past Surgical History (Dahionnarah [place], RMA; 07/26/2014 10:30 AM) Colon Polyp Removal - Colonoscopy Foot Surgery Right. Gallbladder Surgery - Laparoscopic Hysterectomy (not due to cancer) - Partial Oral Surgery  Diagnostic Studies History Alexander Bergeron [place], [place]; 07/26/2014 10:30 AM) Colonoscopy 1-5 years ago Mammogram 1-3 years ago Pap Smear 1-5 years ago  Allergies ([place], [place]; 07/26/2014 10:34 AM) Statins Depletion *DIETARY PRODUCTS/DIETARY MANAGEMENT PRODUCTS* Meloxicam *CHEMICALS* CeleBREX *ANALGESICS - ANTI-INFLAMMATORY* Ibuprofen *ANALGESICS - ANTI-INFLAMMATORY* Lipitor *ANTIHYPERLIPIDEMICS*  Medication History Stark Klein, MD; 07/27/2014 10:16 AM) Omega 3 (1000MG  Capsule, Oral)  Active. Atenolol-Chlorthalidone (100-25MG  Tablet, Oral) Active. Gabapentin (300MG  Capsule, Oral) Active. Niacin ER (Antihyperlipidemic) (1000MG  Tablet ER, Oral) Active. AmLODIPine Besylate (5MG  Tablet, Oral) Active. Lisinopril (20MG  Tablet, Oral) Active. Centrum Heart (Oral) Active. Caltrate 600+D Plus Minerals (600-800MG -UNIT Tablet Chewable, Oral) Active. Colace (100MG  Capsule, Oral) Active. Vitamin D3 (10000UNIT Capsule, Oral) Active. Aspirin (81MG  Tablet, 1 (one) Oral) Active. Fiber Complete (Oral) Active. Pearls IC (Oral) Specific dose unknown - Active. Medications Reconciled  Social History Alexander Bergeron [place], [place]; 07/26/2014 10:30 AM) Alcohol use Occasional alcohol use. No caffeine use No drug use Tobacco use Never smoker.  Family History ([place], [place]; 07/26/2014 10:30 AM) Heart Disease Father. Heart disease in female family member before age 77  Pregnancy / Birth History Coletta Mem[place], [place]; 07/26/2014 10:30 AM) Age at menarche 63 years. Age of menopause 51-55 Contraceptive History Oral contraceptives. Gravida 1 Maternal age 60-40 Para 1 Regular periods  Review of Systems ([place] RMA; 07/26/2014 10:30 AM) General Not Present- Appetite Loss, Chills, Fatigue, Fever, Night Sweats, Weight Gain and Weight Loss. Skin Not Present- Change in Wart/Mole, Dryness, Hives, Jaundice, New Lesions, Non-Healing Wounds, Rash and Ulcer. HEENT Present- Wears glasses/contact lenses. Not Present- Earache, Hearing Loss, Hoarseness, Nose Bleed, Oral Ulcers, Ringing in the Ears, Seasonal Allergies, Sinus Pain, Sore Throat, Visual Disturbances and Yellow Eyes. Respiratory Not Present- Bloody sputum, Chronic Cough, Difficulty Breathing, Snoring and Wheezing. Breast Not Present- Breast Mass, Breast Pain, Nipple Discharge and Skin Changes. Cardiovascular Present- Swelling of Extremities. Not Present- Chest Pain, Difficulty Breathing  Lying Down, Leg Cramps, Palpitations, Rapid Heart Rate and Shortness of Breath. Gastrointestinal Present- Abdominal Pain. Not Present- Bloating, Bloody Stool, Change in Bowel Habits, Chronic diarrhea, Constipation, Difficulty Swallowing, Excessive gas, Gets full quickly at meals, Hemorrhoids, Indigestion, Nausea, Rectal Pain and Vomiting. Female Genitourinary Present- Pelvic Pain. Not Present- Frequency, Nocturia, Painful Urination and Urgency. Musculoskeletal Not Present- Back Pain, Joint  Pain, Joint Stiffness, Muscle Pain, Muscle Weakness and Swelling of Extremities. Neurological Present- Trouble walking. Not Present- Decreased Memory, Fainting, Headaches, Numbness, Seizures, Tingling, Tremor and Weakness. Psychiatric Not Present- Anxiety, Bipolar, Change in Sleep Pattern, Depression, Fearful and Frequent crying. Endocrine Not Present- Cold Intolerance, Excessive Hunger, Hair Changes, Heat Intolerance, Hot flashes and New Diabetes. Hematology Not Present- Easy Bruising, Excessive bleeding, Gland problems, HIV and Persistent Infections.   Vitals (Dahionnarah Maldonado RMA; 07/26/2014 10:33 AM) 07/26/2014 10:32 AM Weight: 225 lb Height: 66in Weight was reported by patient. Body Surface Area: 2.18 m Body Mass Index: 36.32 kg/m Pulse: 75 (Regular)  P.OX: 97% (Room air)    Physical Exam Stark Klein MD; 07/27/2014 2:48 PM) General Mental Status-Alert. General Appearance-Consistent with stated age. Hydration-Well hydrated. Voice-Normal.  Head and Neck Head-normocephalic, atraumatic with no lesions or palpable masses.  Eye Sclera/Conjunctiva - Bilateral-No scleral icterus.  Chest and Lung Exam Chest and lung exam reveals -quiet, even and easy respiratory effort with no use of accessory muscles. Inspection Chest Wall - Normal. Back - normal.  Breast - Did not examine.  Cardiovascular Cardiovascular examination reveals -normal pedal pulses bilaterally. Note:  regular rate and rhythm  Abdomen Inspection Hernias - Incisional - Reducible(supraumbilical trocar site.). Inguinal hernia - Left - Reducible(large). Right - Reducible(small). Palpation/Percussion Palpation and Percussion of the abdomen reveal - Soft, Non Tender, No Rebound tenderness, No Rigidity (guarding) and No hepatosplenomegaly. Auscultation Auscultation of the abdomen reveals - Bowel sounds normal.  Peripheral Vascular Upper Extremity Inspection - Bilateral - Normal - No Clubbing, No Cyanosis, No Edema, Pulses Intact. Lower Extremity Palpation - Edema - Bilateral - No edema.  Neurologic Neurologic evaluation reveals -alert and oriented x 3 with no impairment of recent or remote memory. Mental Status-Normal.  Musculoskeletal Global Assessment -Note: right ankle in walking boot.  Normal Exam - Left-Upper Extremity Strength Normal and Lower Extremity Strength Normal. Normal Exam - Right-Upper Extremity Strength Normal and Lower Extremity Strength Normal.  Lymphatic Head & Neck  General Head & Neck Lymphatics: Bilateral - Description - Normal. Axillary  General Axillary Region: Bilateral - Description - Normal. Tenderness - Non Tender.    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 I reviewed the risks of bleeding, infection, damage to adjacent structures, and possibility of additional procedures. We reviewed the risk of finding cancer. I will work in conjunction with Dr. Ninfa Linden to do case and for him to get hernias done. We reviewed the time in the hospital as well as pain management post operatively. I discussed that we may need to do hand assisted surgery. I also discussed removing specimen via umbilical hernia site in order to avoid making new defect in abdominal wall.  She understands and wishes to proceed. Current  Plans  Schedule for Surgery Advised patient to stop ASA, anticoagulant, blood thinners, and NSAIDs five (5) days prior to surgery. Pt Education - [facility] Pt Education - [facility] adrenalectomy   Signed by Stark Klein, MD (07/27/2014 4:08 PM)

## 2014-08-20 NOTE — Anesthesia Postprocedure Evaluation (Signed)
  Anesthesia Post-op Note  Patient: Evelyn Campbell  Procedure(s) Performed: Procedure(s): LAPAROSCOPIC ADRENALECTOMY (N/A) LAPAROSCOPIC BILATERAL INGUINAL HERNIA REPAIR WITH MESH (Bilateral) HERNIA REPAIR UMBILICAL ADULT (N/A) INSERTION OF MESH (N/A)  Patient Location: PACU  Anesthesia Type:General  Level of Consciousness: awake, alert , oriented and patient cooperative  Airway and Oxygen Therapy: Patient Spontanous Breathing and Patient connected to nasal cannula oxygen  Post-op Pain: mild  Post-op Assessment: Post-op Vital signs reviewed, Patient's Cardiovascular Status Stable, Respiratory Function Stable, Patent Airway, No signs of Nausea or vomiting and Pain level controlled  Post-op Vital Signs: Reviewed and stable  Last Vitals:  Filed Vitals:   08/20/14 1720  BP: 113/65  Pulse: 61  Temp:   Resp: 13    Complications: No apparent anesthesia complications

## 2014-08-20 NOTE — Interval H&P Note (Signed)
History and Physical Interval Note:no change in H and P  08/20/2014 11:36 AM  Evelyn Campbell  has presented today for surgery, with the diagnosis of BILATERAL INGUINAL HERNIA'S, UMBILICAL HERNIA, LEFT ADRENAL MASS  The various methods of treatment have been discussed with the patient and family. After consideration of risks, benefits and other options for treatment, the patient has consented to  Procedure(s): LAPAROSCOPIC ADRENALECTOMY (N/A) LAPAROSCOPIC BILATERAL INGUINAL HERNIA REPAIR WITH MESH (Bilateral) HERNIA REPAIR UMBILICAL ADULT (N/A) INSERTION OF MESH (N/A) as a surgical intervention .  The patient's history has been reviewed, patient examined, no change in status, stable for surgery.  I have reviewed the patient's chart and labs.  Questions were answered to the patient's satisfaction.     [name]

## 2014-08-20 NOTE — Op Note (Signed)
LAPAROSCOPIC ADRENALECTOMY  Procedure Note  Evelyn Campbell 08/20/2014   Pre-op Diagnosis: BILATERAL INGUINAL HERNIAS, UMBILICAL HERNIA, LEFT ADRENAL MASS     Post-op Diagnosis: same  Procedure(s): LAPAROSCOPIC ADRENALECTOMY LAPAROSCOPIC BILATERAL INGUINAL HERNIA REPAIR WITH MESH HERNIA REPAIR UMBILICAL ADULT INSERTION OF MESH  Surgeon(s): Stark Klein, MD Coralie Keens, MD  Anesthesia: General  Staff:  Circulator: Montel Culver, RN Relief Scrub: Leslie Andrea, CST Scrub Person: Megan Day Cavanaugh, RN; Quincy Carnes, RN Circulator Assistant: Paulo Fruit, RN; Sharlot Gowda, RN  Estimated Blood Loss: Minimal               Specimens: sent to path          [facility] A   Date: 08/20/2014  Time: 3:37 PM

## 2014-08-20 NOTE — Transfer of Care (Signed)
Immediate Anesthesia Transfer of Care Note  Patient: Evelyn Campbell  Procedure(s) Performed: Procedure(s): LAPAROSCOPIC ADRENALECTOMY (N/A) LAPAROSCOPIC BILATERAL INGUINAL HERNIA REPAIR WITH MESH (Bilateral) HERNIA REPAIR UMBILICAL ADULT (N/A) INSERTION OF MESH (N/A)  Patient Location: PACU  Anesthesia Type:General  Level of Consciousness: awake, alert , oriented and patient cooperative  Airway & Oxygen Therapy: Patient Spontanous Breathing and Patient connected to nasal cannula oxygen  Post-op Assessment: Report given to PACU RN, Post -op Vital signs reviewed and stable and Patient moving all extremities  Post vital signs: Reviewed and stable  Complications: No apparent anesthesia complications

## 2014-08-20 NOTE — Anesthesia Procedure Notes (Signed)
Procedure Name: Intubation Date/Time: 08/20/2014 12:43 PM Performed by: Manuela Schwartz B Pre-anesthesia Checklist: Patient identified, Emergency Drugs available, Suction available, Patient being monitored and Timeout performed Patient Re-evaluated:Patient Re-evaluated prior to inductionOxygen Delivery Method: Circle system utilized Preoxygenation: Pre-oxygenation with 100% oxygen Intubation Type: IV induction Ventilation: Mask ventilation without difficulty Laryngoscope Size: Mac and 3 Grade View: Grade I Tube type: Oral Tube size: 7.5 mm Number of attempts: 1 Airway Equipment and Method: Stylet Placement Confirmation: ETT inserted through vocal cords under direct vision,  positive ETCO2 and breath sounds checked- equal and bilateral Secured at: 21 cm Tube secured with: Tape Dental Injury: Teeth and Oropharynx as per pre-operative assessment

## 2014-08-20 NOTE — Progress Notes (Addendum)
Patient not familiar with term incisional hernia will wait until Dr. Ninfa Linden speaks to patient further before having her sign consent form. OR staff made aware of consent issue will notify Dr. Ninfa Linden

## 2014-08-20 NOTE — Anesthesia Preprocedure Evaluation (Addendum)
Anesthesia Evaluation  Patient identified by MRN, date of birth, ID band Patient awake    Reviewed: Allergy & Precautions, H&P , NPO status , Patient's Chart, lab work & pertinent test results, reviewed documented beta blocker date and time   Airway Mallampati: II TM Distance: >3 FB Neck ROM: Full    Dental no notable dental hx. (+) Teeth Intact, Dental Advisory Given   Pulmonary neg pulmonary ROS,  breath sounds clear to auscultation  Pulmonary exam normal       Cardiovascular hypertension, Pt. on medications and Pt. on home beta blockers Rhythm:Regular Rate:Normal     Neuro/Psych negative neurological ROS  negative psych ROS   GI/Hepatic Neg liver ROS, GERD-  Controlled,  Endo/Other  Morbid obesity  Renal/GU negative Renal ROS  negative genitourinary   Musculoskeletal  (+) Arthritis -, Osteoarthritis,    Abdominal   Peds  Hematology negative hematology ROS (+)   Anesthesia Other Findings   Reproductive/Obstetrics negative OB ROS                          Anesthesia Physical Anesthesia Plan  ASA: III  Anesthesia Plan: General   Post-op Pain Management:    Induction: Intravenous  Airway Management Planned: Oral ETT  Additional Equipment:   Intra-op Plan:   Post-operative Plan: Extubation in OR  Informed Consent: I have reviewed the patients History and Physical, chart, labs and discussed the procedure including the risks, benefits and alternatives for the proposed anesthesia with the patient or authorized representative who has indicated his/her understanding and acceptance.   Dental advisory given  Plan Discussed with: CRNA  Anesthesia Plan Comments:         Anesthesia Quick Evaluation

## 2014-08-20 NOTE — Interval H&P Note (Signed)
History and Physical Interval Note:  08/20/2014 11:31 AM  Evelyn Campbell  has presented today for surgery, with the diagnosis of BILATERAL INGUINAL HERNIA'S, UMBILICAL HERNIA, LEFT ADRENAL MASS  The various methods of treatment have been discussed with the patient and family. After consideration of risks, benefits and other options for treatment, the patient has consented to  Procedure(s): LAPAROSCOPIC ADRENALECTOMY (N/A) LAPAROSCOPIC BILATERAL INGUINAL HERNIA REPAIR WITH MESH (Bilateral) HERNIA REPAIR UMBILICAL ADULT (N/A) INSERTION OF MESH (N/A) as a surgical intervention .  The patient's history has been reviewed, patient examined, no change in status, stable for surgery.  I have reviewed the patient's chart and labs.  Questions were answered to the patient's satisfaction.     Myer Bohlman

## 2014-08-20 NOTE — Op Note (Signed)
PRE-OPERATIVE DIAGNOSIS: left adrenal mass   POST-OPERATIVE DIAGNOSIS:  Same  PROCEDURE:  Procedure(s): Laparoscopic hand assisted left adrenalectomy  SURGEON:  Surgeon(s): Stark Klein, MD  ASSISTANT:  Nedra Hai, MD  ANESTHESIA:   local and general  DRAINS: none   LOCAL MEDICATIONS USED:  LIDOCAINE   SPECIMEN:  Source of Specimen:  left adrenal mass  DISPOSITION OF SPECIMEN:  PATHOLOGY  COUNTS:  YES  DICTATION: .Dragon Dictation  PLAN OF CARE: Admit to inpatient   PATIENT DISPOSITION:  PACU - hemodynamically stable.  EBL:  Minimal  FINDINGS:  Mobile adrenal mass on left.  Soft in character.  Omentum containing umbilical hernia  PROCEDURE:  Patient was identified in the holding area and taken to the operating room where she was placed on the operating room table. General anesthesia was induced. She was placed into a semilateral position with the left side up. This was on a beanbag with an axillary roll. A Foley catheter was placed. Her abdomen was then prepped and draped in sterile fashion. A timeout was performed according to the surgical safety checklist. When all was correct, we continued. The left subcostal region was identified and a 5 mm Optiview port was placed in this location. The peritoneum was achieved to a pressure of 15 mm of mercury.   Umbilical region was identified and there was significant amount of omentum adherent to the hernia sac. Two left-sided 5 mm ports were placed to facilitate reducing this hernia. Dr. Ninfa Linden performed this portion of the procedure. We then made a HandPort approximately 5 cm long in the supraumbilical location and the area of the hernia sac. The hernia was extended around 1-2 cm. The hand port was placed and the patient was placed in more reverse Trendelenburg. The splenic flexure was mobilized medially. The anterior kidney was identified. The harmonic scalpel was then used to take the adhesions of the adrenal to the  retroperitoneum. The adrenal vein was triply clipped and divided with the harmonic. The spleen was left in. The lesser sac was not entered. The adrenal was pulled out through the hand port. The left upper quadrant was reexamined for hemostasis. There was no evidence of bleeding. A four-quadrant inspection was performed and this demonstrated no evidence of bowel injury or gluing. The case was then turned back over to Dr. Ninfa Linden for repair of the incisional hernia. This will be dictated separately. Once the incisional hernia was repaired, the skin of all incisions was closed with 4-0 Monocryl in subcuticular fashion. The wounds were then cleaned, dried, and dressed with benzoin and Steri-Strips. The patient was then placed back supine for Dr. Ninfa Linden to proceed with repair of bilateral inguinal hernias.  Needle, sponge, and instrument counts were correct times x2.

## 2014-08-21 LAB — BASIC METABOLIC PANEL
Anion gap: 13 (ref 5–15)
BUN: 24 mg/dL — AB (ref 6–23)
CHLORIDE: 100 meq/L (ref 96–112)
CO2: 27 mEq/L (ref 19–32)
Calcium: 9.1 mg/dL (ref 8.4–10.5)
Creatinine, Ser: 0.72 mg/dL (ref 0.50–1.10)
Glucose, Bld: 161 mg/dL — ABNORMAL HIGH (ref 70–99)
POTASSIUM: 4.3 meq/L (ref 3.7–5.3)
SODIUM: 140 meq/L (ref 137–147)

## 2014-08-21 LAB — CBC
HCT: 33.8 % — ABNORMAL LOW (ref 36.0–46.0)
HEMOGLOBIN: 11.3 g/dL — AB (ref 12.0–15.0)
MCH: 29 pg (ref 26.0–34.0)
MCHC: 33.4 g/dL (ref 30.0–36.0)
MCV: 86.9 fL (ref 78.0–100.0)
Platelets: 160 10*3/uL (ref 150–400)
RBC: 3.89 MIL/uL (ref 3.87–5.11)
RDW: 14.9 % (ref 11.5–15.5)
WBC: 13.9 10*3/uL — AB (ref 4.0–10.5)

## 2014-08-21 MED ORDER — KCL IN DEXTROSE-NACL 20-5-0.45 MEQ/L-%-% IV SOLN
INTRAVENOUS | Status: DC
  Administered 2014-08-21: 21:00:00 via INTRAVENOUS
  Filled 2014-08-21 (×3): qty 1000

## 2014-08-21 NOTE — Discharge Instructions (Signed)
[facility]      [facility] °[phone] ° °ABDOMINAL SURGERY: POST OP INSTRUCTIONS ° °Always review your discharge instruction sheet given to you by the facility where your surgery was performed. ° °IF YOU HAVE DISABILITY OR FAMILY LEAVE FORMS, YOU MUST BRING THEM TO THE OFFICE FOR PROCESSING.  PLEASE DO NOT GIVE THEM TO YOUR DOCTOR. ° °1. A prescription for pain medication may be given to you upon discharge.  Take your pain medication as prescribed, if needed.  If narcotic pain medicine is not needed, then you may take acetaminophen (Tylenol) or ibuprofen (Advil) as needed. °2. Take your usually prescribed medications unless otherwise directed. °3. If you need a refill on your pain medication, please contact your pharmacy. They will contact our office to request authorization.  Prescriptions will not be filled after 5pm or on week-ends. °4. You should follow a light diet the first few days after arrival home, such as soup and crackers, pudding, etc.unless your doctor has advised otherwise. A high-fiber, low fat diet can be resumed as tolerated.   Be sure to include lots of fluids daily. Most patients will experience some swelling and bruising on the chest and neck area.  Ice packs will help.  Swelling and bruising can take several days to resolve °5. Most patients will experience some swelling and bruising in the area of the incision. Ice pack will help. Swelling and bruising can take several days to resolve..  °6. It is common to experience some constipation if taking pain medication after surgery.  Increasing fluid intake and taking a stool softener will usually help or prevent this problem from occurring.  A mild laxative (Milk of Magnesia or Miralax) should be taken according to package directions if there are no bowel movements after 48 hours. °7.  You may have steri-strips (small skin tapes) in place directly over the incision.  These strips should be left on the skin for 10-14 days.  If your  surgeon used skin glue on the incision, you may shower in 48 hours.  The glue will flake off over the next 2-3 weeks.  Any sutures or staples will be removed at the office during your follow-up visit. You may find that a light gauze bandage over your incision may keep your staples from being rubbed or pulled. You may shower and replace the bandage daily. °8. ACTIVITIES:  You may resume regular (light) daily activities beginning the next day--such as daily self-care, walking, climbing stairs--gradually increasing activities as tolerated.  You may have sexual intercourse when it is comfortable.  Refrain from any heavy lifting or straining until approved by your doctor. °a. You may drive when you no longer are taking prescription pain medication, you can comfortably wear a seatbelt, and you can safely maneuver your car and apply brakes °b. Return to Work: __________6 weeks if applicable_________________________ °9. You should see your doctor in the office for a follow-up appointment approximately two weeks after your surgery.  Make sure that you call for this appointment within a day or two after you arrive home to insure a convenient appointment time. °OTHER INSTRUCTIONS:  °_____________________________________________________________ °_____________________________________________________________ ° °WHEN TO CALL YOUR DOCTOR: °1. Fever over 101.0 °2. Inability to urinate °3. Nausea and/or vomiting °4. Extreme swelling or bruising °5. Continued bleeding from incision. °6. Increased pain, redness, or drainage from the incision. °7. Difficulty swallowing or breathing °8. Muscle cramping or spasms. °9. Numbness or tingling in hands or feet or around lips. ° °The clinic staff is   available to answer your questions during regular business hours.  Please don’t hesitate to call and ask to speak to one of the nurses if you have concerns. ° °For further questions, please visit [email or web address] ° ° ° °

## 2014-08-21 NOTE — Op Note (Signed)
NAMESAAMIYA, Evelyn Campbell NO.:  [id number]  MEDICAL RECORD NO.:  [id number]  LOCATION:  6N31C                        FACILITY:  [facility]  PHYSICIAN:  Coralie Keens, M.D. DATE OF BIRTH:  [birth year]  DATE OF PROCEDURE:  08/20/2014 DATE OF DISCHARGE:                              OPERATIVE REPORT   PREOPERATIVE DIAGNOSES:  Bilateral inguinal hernias, umbilical incisional hernia, and left renal mass.  POSTOPERATIVE DIAGNOSES:  Bilateral inguinal hernias, umbilical incisional hernia, and left renal mass.  PROCEDURE:  Bilateral laparoscopic inguinal hernia repair with mesh, laparoscopic-assisted incisional hernia repair with mesh.  SURGEON:  Coralie Keens, M.D.  ASSISTANT:  Stark Klein, MD.  PROCEDURE IN DETAIL:  Dr. Barry Dienes had already completed the adrenalectomy.  At this point, I had already laparoscopically taken down the omentum from the incarcerated incisional hernia at the umbilicus.  I then brought an 8-cm round ventral patch onto the field.  I placed it through the fascial opening and pulled it up against the peritoneum with stay ties.  I then sewed it in place circumferentially with interrupted #1 Novafil sutures.  The fascia was then closed over top of the mesh with #1 Novafil sutures as well.  The abdomen was then reinsufflated and the mesh was tacked in circumferentially with absorbable tacks.  We also used a suture passer to close a small defect in the fascia at the upper end of the mesh incorporating the mesh as well to get good fascial closure.  Wide coverage of the fascial defect appeared to be controlled circumferentially.  At this point, the abdomen was deflated.  The patient was then placed in the supine position.  Her abdomen was then prepped and draped again in the usual sterile fashion.  I made a small incision below the umbilicus and took this down to the fascia.  The fascia was then opened just to the right of the midline.  The  rectus muscle was elevated.  I passed the dissecting balloon underneath the rectus muscle and manipulated it towards the pelvis.  The dissecting balloon was then insufflated under direct vision dissecting down the preperitoneal space.  The dissecting balloon was then removed and insufflation began with carbon dioxide.  Two 5-mm trocars were then placed in the patient's midline under direct vision.  I dissected out the small right inguinal hernia first.  This was a direct hernia.  The sac was easily reduced.  Cooper ligament was also identified.  I then turned my attention to the much larger left inguinal hernia which had remained incarcerated.  I was able to reduce all omentum and contents from the hernia sac completely.  Again, this was a direct hernia on the left as well.  I then brought 2 separate pieces of Bard precut 3D Max Prolene mesh onto the field.  I placed the left side of the mesh through the umbilical port and then opened as an onlay on the left inguinal floor while covering the fascial defect.  I then tacked it to Dundee ligament up the medial abdominal wall and slightly laterally.  I then brought the right-sided piece of mesh in as well and placed this as an onlay on the inguinal floor.  It  did not require tacking and laid in place quite well covering the right inguinal defect.  At this point, all ports were removed in the preperitoneal space with the mesh intact and appeared to lie appropriately.  I then removed the port of the umbilicus and closed the fascial defect at the umbilicus with a figure-of-eight 0- Vicryl suture.  All incisions were then anesthetized with Marcaine and closed with 4-0 Monocryl subcuticular sutures.  Steri-Strips and Band- Aids were then applied.  I performed bilateral ilioinguinal nerve blocks with Marcaine as well. The patient tolerated the procedure well.  All the counts were correct at the end of procedure.  The patient was then extubated in  the operating room and taken in a stable condition to the recovery room.     Coralie Keens, M.D.     DB/MEDQ  D:  08/20/2014  T:  08/21/2014  Job:  [id number]

## 2014-08-21 NOTE — Progress Notes (Signed)
1 Day Post-Op  Subjective: No n/v.  Pain tolerable.    Objective: Vital signs in last 24 hours: Temp:  [97.4 F (36.3 C)-98.8 F (37.1 C)] 97.4 F (36.3 C) (10/21 0540) Pulse Rate:  [49-83] 83 (10/21 0540) Resp:  [10-20] 16 (10/21 0540) BP: (96-125)/(55-71) 96/64 mmHg (10/21 0540) SpO2:  [93 %-98 %] 96 % (10/21 0540) Weight:  [233 lb (105.688 kg)] 233 lb (105.688 kg) (10/20 1026) Last BM Date: 08/18/14  Intake/Output from previous day: 10/20 0701 - 10/21 0700 In: 4088.8 [P.O.:480; I.V.:3508.8; IV Piggyback:100] Out: 1150 [Urine:1150] Intake/Output this shift:    General appearance: alert, cooperative, appears stated age and no distress Resp: breathing comfortably GI: soft, non distended, approp tender.  Some bruising at supraumbilical incision.   Extremities: right foot in walking boot, left calf wtih PAS hose.    Lab Results:   Recent Labs  08/20/14 1930 08/21/14 0354  WBC 18.2* 13.9*  HGB 12.5 11.3*  HCT 36.8 33.8*  PLT 175 160   BMET  Recent Labs  08/20/14 1930 08/21/14 0354  NA  --  140  K  --  4.3  CL  --  100  CO2  --  27  GLUCOSE  --  161*  BUN  --  24*  CREATININE 0.68 0.72  CALCIUM  --  9.1   PT/INR No results found for this basename: LABPROT, INR,  in the last 72 hours ABG No results found for this basename: PHART, PCO2, PO2, HCO3,  in the last 72 hours  Studies/Results: No results found.  Anti-infectives: Anti-infectives   Start     Dose/Rate Route Frequency Ordered Stop   08/20/14 2000  ceFAZolin (ANCEF) IVPB 1 g/50 mL premix     1 g 100 mL/hr over 30 Minutes Intravenous Every 6 hours 08/20/14 1816 08/21/14 0834   08/20/14 0600  ceFAZolin (ANCEF) IVPB 2 g/50 mL premix     2 g 100 mL/hr over 30 Minutes Intravenous On call to O.R. 08/19/14 1358 08/20/14 1325      Assessment/Plan: s/p Procedure(s): LAPAROSCOPIC ADRENALECTOMY (N/A) LAPAROSCOPIC BILATERAL INGUINAL HERNIA REPAIR WITH MESH (Bilateral) HERNIA REPAIR UMBILICAL ADULT  (N/A) INSERTION OF MESH (N/A) d/c foley Advance diet Await pathology If passing gas and not having significant bloating, will possibly allow to go home tomorrow.  She may develop ileus which precludes this.  We will see how she is in the AM.  Decrease IVF.   LOS: 1 day    [facility] 08/21/2014

## 2014-08-22 LAB — BASIC METABOLIC PANEL
Anion gap: 9 (ref 5–15)
BUN: 24 mg/dL — AB (ref 6–23)
CHLORIDE: 101 meq/L (ref 96–112)
CO2: 28 meq/L (ref 19–32)
Calcium: 8.8 mg/dL (ref 8.4–10.5)
Creatinine, Ser: 0.87 mg/dL (ref 0.50–1.10)
GFR calc Af Amer: 82 mL/min — ABNORMAL LOW (ref >90)
GFR calc non Af Amer: 71 mL/min — ABNORMAL LOW (ref >90)
GLUCOSE: 117 mg/dL — AB (ref 70–99)
Potassium: 4.3 mEq/L (ref 3.7–5.3)
Sodium: 138 mEq/L (ref 137–147)

## 2014-08-22 LAB — CBC
HEMATOCRIT: 31.9 % — AB (ref 36.0–46.0)
Hemoglobin: 10.7 g/dL — ABNORMAL LOW (ref 12.0–15.0)
MCH: 29 pg (ref 26.0–34.0)
MCHC: 33.5 g/dL (ref 30.0–36.0)
MCV: 86.4 fL (ref 78.0–100.0)
PLATELETS: 148 10*3/uL — AB (ref 150–400)
RBC: 3.69 MIL/uL — AB (ref 3.87–5.11)
RDW: 15.3 % (ref 11.5–15.5)
WBC: 9.1 10*3/uL (ref 4.0–10.5)

## 2014-08-22 NOTE — Progress Notes (Signed)
Patient ID: Evelyn Campbell, female   DOB: [birth year], 58 y.o.   MRN: [id number] 2 Days Post-Op   Subjective: No n/v.  Pain tolerable when still, but still having a lot of pain getting up  Objective: Vital signs in last 24 hours: Temp:  [97.4 F (36.3 C)-98.4 F (36.9 C)] 98.4 F (36.9 C) (10/22 0608) Pulse Rate:  [64-74] 74 (10/22 0608) Resp:  [16-18] 18 (10/22 2993) BP: (92-117)/(42-56) 117/54 mmHg (10/22 0608) SpO2:  [93 %-97 %] 97 % (10/22 7169) Weight:  [233 lb (105.688 kg)] 233 lb (105.688 kg) (10/21 0900) Last BM Date: 08/18/14  Intake/Output from previous day: 10/21 0701 - 10/22 0700 In: 480 [I.V.:480] Out: 400 [Urine:400] Intake/Output this shift:    General appearance: alert, cooperative, appears stated age and no distress Resp: breathing comfortably GI: soft, non distended, approp tender.  Some bruising at supraumbilical incision.   Extremities: right foot in walking boot, left calf wtih PAS hose.    Lab Results:   Recent Labs  08/21/14 0354 08/22/14 0543  WBC 13.9* 9.1  HGB 11.3* 10.7*  HCT 33.8* 31.9*  PLT 160 148*   BMET  Recent Labs  08/21/14 0354 08/22/14 0543  NA 140 138  K 4.3 4.3  CL 100 101  CO2 27 28  GLUCOSE 161* 117*  BUN 24* 24*  CREATININE 0.72 0.87  CALCIUM 9.1 8.8   PT/INR No results found for this basename: LABPROT, INR,  in the last 72 hours ABG No results found for this basename: PHART, PCO2, PO2, HCO3,  in the last 72 hours  Studies/Results: No results found.  Anti-infectives: Anti-infectives   Start     Dose/Rate Route Frequency Ordered Stop   08/20/14 2000  ceFAZolin (ANCEF) IVPB 1 g/50 mL premix     1 g 100 mL/hr over 30 Minutes Intravenous Every 6 hours 08/20/14 1816 08/21/14 0834   08/20/14 0600  ceFAZolin (ANCEF) IVPB 2 g/50 mL premix     2 g 100 mL/hr over 30 Minutes Intravenous On call to O.R. 08/19/14 1358 08/20/14 1325      Assessment/Plan: s/p Procedure(s): LAPAROSCOPIC ADRENALECTOMY  (N/A) LAPAROSCOPIC BILATERAL INGUINAL HERNIA REPAIR WITH MESH (Bilateral) HERNIA REPAIR UMBILICAL ADULT (N/A) INSERTION OF MESH (N/A)  Diet as tolerated. Add abdominal binder for comfort. Pathology benign. Still too sore to go home.  Saline lock IVF   LOS: 2 days    [facility] 08/22/2014

## 2014-08-23 ENCOUNTER — Encounter (HOSPITAL_COMMUNITY): Payer: Self-pay | Admitting: General Surgery

## 2014-08-23 NOTE — Progress Notes (Signed)
3 Days Post-Op  Subjective: Still with moderate incisional pain at umbilicus Tolerating po  Objective: Vital signs in last 24 hours: Temp:  [97.2 F (36.2 C)-98.5 F (36.9 C)] 98.5 F (36.9 C) (10/23 0501) Pulse Rate:  [77-93] 77 (10/23 0501) Resp:  [18-22] 18 (10/23 0501) BP: (89-116)/(37-55) 108/37 mmHg (10/23 0501) SpO2:  [90 %-97 %] 96 % (10/23 0501) Last BM Date: 08/18/14  Intake/Output from previous day: 10/22 0701 - 10/23 0700 In: 660 [P.O.:660] Out: -  Intake/Output this shift: Total I/O In: 180 [P.O.:180] Out: -   Abdomen soft, incisions clean  Lab Results:   Recent Labs  08/21/14 0354 08/22/14 0543  WBC 13.9* 9.1  HGB 11.3* 10.7*  HCT 33.8* 31.9*  PLT 160 148*   BMET  Recent Labs  08/21/14 0354 08/22/14 0543  NA 140 138  K 4.3 4.3  CL 100 101  CO2 27 28  GLUCOSE 161* 117*  BUN 24* 24*  CREATININE 0.72 0.87  CALCIUM 9.1 8.8   PT/INR No results found for this basename: LABPROT, INR,  in the last 72 hours ABG No results found for this basename: PHART, PCO2, PO2, HCO3,  in the last 72 hours  Studies/Results: No results found.  Anti-infectives: Anti-infectives   Start     Dose/Rate Route Frequency Ordered Stop   08/20/14 2000  ceFAZolin (ANCEF) IVPB 1 g/50 mL premix     1 g 100 mL/hr over 30 Minutes Intravenous Every 6 hours 08/20/14 1816 08/21/14 0834   08/20/14 0600  ceFAZolin (ANCEF) IVPB 2 g/50 mL premix     2 g 100 mL/hr over 30 Minutes Intravenous On call to O.R. 08/19/14 1358 08/20/14 1325      Assessment/Plan: s/p Procedure(s): LAPAROSCOPIC ADRENALECTOMY (N/A) LAPAROSCOPIC BILATERAL INGUINAL HERNIA REPAIR WITH MESH (Bilateral) HERNIA REPAIR UMBILICAL ADULT (N/A) INSERTION OF MESH (N/A)  Continuing pain control Hopefully home later today  LOS: 3 days    Rowene Suto A 08/23/2014

## 2014-08-24 MED ORDER — OXYCODONE-ACETAMINOPHEN 5-325 MG PO TABS: 1.0000 | ORAL_TABLET | ORAL | Status: DC | PRN

## 2014-08-24 NOTE — Progress Notes (Signed)
4 Days Post-Op  Subjective: Pain controlled, had bm, no complaints, wants to go home  Objective: Vital signs in last 24 hours: Temp:  [97.5 F (36.4 C)-98.3 F (36.8 C)] 97.8 F (36.6 C) (10/24 0648) Pulse Rate:  [69-71] 70 (10/24 0648) Resp:  [17-18] 17 (10/24 0648) BP: (91-97)/(46-57) 94/50 mmHg (10/24 0648) SpO2:  [93 %-95 %] 94 % (10/24 0648) Last BM Date: 08/23/14  Intake/Output from previous day: 10/23 0701 - 10/24 0700 In: 960 [P.O.:960] Out: -  Intake/Output this shift: Total I/O In: 240 [P.O.:240] Out: -   General appearance: no distress GI: approp tender all incisions clean bs present  Lab Results:   Recent Labs  08/22/14 0543  WBC 9.1  HGB 10.7*  HCT 31.9*  PLT 148*   BMET  Recent Labs  08/22/14 0543  NA 138  K 4.3  CL 101  CO2 28  GLUCOSE 117*  BUN 24*  CREATININE 0.87  CALCIUM 8.8   PT/INR No results found for this basename: LABPROT, INR,  in the last 72 hours ABG No results found for this basename: PHART, PCO2, PO2, HCO3,  in the last 72 hours  Studies/Results: No results found.  Anti-infectives: Anti-infectives   Start     Dose/Rate Route Frequency Ordered Stop   08/20/14 2000  ceFAZolin (ANCEF) IVPB 1 g/50 mL premix     1 g 100 mL/hr over 30 Minutes Intravenous Every 6 hours 08/20/14 1816 08/21/14 0834   08/20/14 0600  ceFAZolin (ANCEF) IVPB 2 g/50 mL premix     2 g 100 mL/hr over 30 Minutes Intravenous On call to O.R. 08/19/14 1358 08/20/14 1325      Assessment/Plan: S/p lap adrenal/ bih repair, incisional hernia  Home today  [facility] 08/24/2014

## 2014-08-24 NOTE — Discharge Planning (Signed)
Copy of AVS and rx to pt. Who verbalizes understanding.  Will dc to private car home with all personal belongings when husband arrives.

## 2014-09-21 NOTE — Discharge Summary (Signed)
Physician Discharge Summary  Patient ID: Evelyn Campbell MRN: [id number] DOB/AGE: 1955/12/23 58 y.o.  Admit date: 08/20/2014 Discharge date: 09/21/2014  Admission Diagnoses: Patient Active Problem List   Diagnosis Date Noted  . Left adrenal mass 08/20/2014  . Essential hypertension, benign 06/14/2014  . Family history of ischemic heart disease 06/14/2014  . Obesity, unspecified 06/14/2014  bilateral inguinal hernias Incisional hernia at the supraumbilical location  Discharge Diagnoses:  Active Problems:   Left adrenal mass   Discharged Condition: stable  Hospital Course:  Pt underwent lap bilateral inguinal hernias by Dr. Ninfa Linden.  I performed a laparoscopic hand assisted left adrenalectomy through the incisional hernia.  Dr. Ninfa Linden then repaired the incisional hernia.  She was admitted to the floor.  She did have pain and first and required some adjustments to her pain regimen.  She was able to advance her diet and transition to oral pain medications.  She was able to void spontaneously and ambulate independently.  She was discharged to home in stable condition.    Consults: None  Significant Diagnostic Studies: labs: K 4.3, Cr 0.87, HCT 31.9 prior to d/c.    Treatments: surgery: see above  Discharge Exam: Blood pressure 94/50, pulse 70, temperature 97.8 F (36.6 C), temperature source Oral, resp. rate 17, height 5\' 6"  (1.676 m), weight 233 lb (105.688 kg), SpO2 94 %. General appearance: alert, cooperative and no distress Resp: breathing comfortably GI: soft, approp tender, non distended  Disposition: 01-Home or Self Care     Medication List    TAKE these medications        amLODipine 5 MG tablet  Commonly known as:  NORVASC  Take 5 mg by mouth daily.     aspirin EC 81 MG tablet  Take 81 mg by mouth daily.     atenolol-chlorthalidone 100-25 MG per tablet  Commonly known as:  TENORETIC  Take 1 tablet by mouth daily.     calcium carbonate 600 MG Tabs  tablet  Commonly known as:  OS-CAL  Take 600 mg by mouth 2 (two) times daily with a meal.     gabapentin 300 MG capsule  Commonly known as:  NEURONTIN  Take 900 mg by mouth at bedtime.     lisinopril 20 MG tablet  Commonly known as:  PRINIVIL,ZESTRIL  Take 20 mg by mouth daily.     multivitamin capsule  Take 1 capsule by mouth 2 (two) times daily. Centrum Heart     niacin 1000 MG CR tablet  Commonly known as:  NIASPAN  Take 2,000 mg by mouth at bedtime.     omega-3 acid ethyl esters 1 G capsule  Commonly known as:  LOVAZA  Take 2 g by mouth 2 (two) times daily.     OVER THE COUNTER MEDICATION  Take by mouth. Fiber gummy     oxyCODONE-acetaminophen 5-325 MG per tablet  Commonly known as:  PERCOCET/ROXICET  Take 1-2 tablets by mouth every 4 (four) hours as needed for moderate pain.     PROBIOTIC PEARLS PO  Take 1 capsule by mouth daily.     STOOL SOFTENER 100 MG capsule  Generic drug:  docusate sodium  Take 100 mg by mouth 2 (two) times daily.     Vitamin D3 1000 UNITS Caps  Take 2 capsules by mouth daily.           Follow-up Information    Follow up with [facility], [place] In 2 weeks.   Specialty:  General Surgery   Contact  information:   [address] [place] [address] [phone]       Signed: Stark Klein 09/21/2014, 7:21 PM

## 2015-01-31 ENCOUNTER — Other Ambulatory Visit: Payer: Self-pay | Admitting: Obstetrics and Gynecology

## 2015-02-03 LAB — CYTOLOGY - PAP

## 2015-06-04 ENCOUNTER — Telehealth: Payer: Self-pay | Admitting: Cardiology

## 2015-06-04 NOTE — Telephone Encounter (Signed)
New Message       Pt calling stating that she doesn't think she needs to see Dr. Marlou Porch every year. Pt states that her PCP manages all of her medications and she will no longer be coming here as a pt.

## 2015-06-04 NOTE — Telephone Encounter (Signed)
Spoke with pt and she states that she was seen by Dr. Addison Lank recently and mentioned needing to call our office and make an appt. Pt states Dr. Addison Lank said it was not necessary for pt to f/u here because she has been controlling pt's BP medications. Pt states that she has no issues with Dr. Marlou Porch and that he has been great but that she would call if she felt cardiology was needed. Will forward to Dr. Marlou Porch to make him aware.

## 2015-06-05 NOTE — Telephone Encounter (Signed)
Thanks for update. Agree. PRN follow up OK Candee Furbish, MD

## 2015-06-16 ENCOUNTER — Other Ambulatory Visit: Payer: Self-pay | Admitting: Surgery

## 2015-06-21 NOTE — Pre-Procedure Instructions (Signed)
Evelyn Campbell  06/21/2015     Your procedure is scheduled on August 31  Report to [facility] Admitting at 8 A.M.  Call this number if you have problems the morning of surgery:  [phone]   Remember:  Do not eat food or drink liquids after midnight.  Take these medicines the morning of surgery with A SIP OF WATER Tylenol (if needed), Amlodipine, Atenolol, Omeprazole   STOP Lovaza, Niacin, Multiple Vitamins, Vitamin D, Aspirin August 24   STOP/ Do not take Aspirin, Aleve, Naproxen, Advil, Ibuprofen, Motrin, Vitamins, Herbs, or Supplements starting August 24   Do not wear jewelry, make-up or nail polish.  Do not wear lotions, powders, or perfumes.  You may wear deodorant.  Do not shave 48 hours prior to surgery.  Men may shave face and neck.  Do not bring valuables to the hospital.  [facility] is not responsible for any belongings or valuables.  Contacts, dentures or bridgework may not be worn into surgery.  Leave your suitcase in the car.  After surgery it may be brought to your room.  For patients admitted to the hospital, discharge time will be determined by your treatment team.  Patients discharged the day of surgery will not be allowed to drive home.   [facility] - Preparing for Surgery  Before surgery, you can play an important role.  Because skin is not sterile, your skin needs to be as free of germs as possible.  You can reduce the number of germs on you skin by washing with CHG (chlorahexidine gluconate) soap before surgery.  CHG is an antiseptic cleaner which kills germs and bonds with the skin to continue killing germs even after washing.  Please DO NOT use if you have an allergy to CHG or antibacterial soaps.  If your skin becomes reddened/irritated stop using the CHG and inform your nurse when you arrive at Short Stay.  Do not shave (including legs and underarms) for at least 48 hours prior to the first CHG shower.  You may shave your face.  Please  follow these instructions carefully:   1.  Shower with CHG Soap the night before surgery and the morning of Surgery.  2.  If you choose to wash your hair, wash your hair first as usual with your normal shampoo.  3.  After you shampoo, rinse your hair and body thoroughly to remove the shampoo.  4.  Use CHG as you would any other liquid soap.  You can apply CHG directly to the skin and wash gently with scrungie or a clean washcloth.  5.  Apply the CHG Soap to your body ONLY FROM THE NECK DOWN.  Do not use on open wounds or open sores.  Avoid contact with your eyes, ears, mouth and genitals (private parts).  Wash genitals (private parts) with your normal soap.  6.  Wash thoroughly, paying special attention to the area where your surgery will be performed.  7.  Thoroughly rinse your body with warm water from the neck down.  8.  DO NOT shower/wash with your normal soap after using and rinsing off the CHG Soap.  9.  Pat yourself dry with a clean towel.            10.  Wear clean pajamas.            11.  Place clean sheets on your bed the night of your first shower and do not sleep with pets.  Day  of Surgery  Do not apply any lotions the morning of surgery.  Please wear clean clothes to the hospital/surgery center.   Please read over the following fact sheets that you were given. Pain Booklet, Coughing and Deep Breathing and Surgical Site Infection Prevention

## 2015-06-23 ENCOUNTER — Encounter (HOSPITAL_COMMUNITY): Payer: Self-pay

## 2015-06-23 ENCOUNTER — Encounter (HOSPITAL_COMMUNITY)
Admission: RE | Admit: 2015-06-23 | Discharge: 2015-06-23 | Disposition: A | Discharge status: Home / Self Care | Payer: BC Managed Care – PPO | Source: Ambulatory Visit | Attending: Surgery | Admitting: Surgery

## 2015-06-23 DIAGNOSIS — R001 Bradycardia, unspecified: Secondary | ICD-10-CM | POA: Insufficient documentation

## 2015-06-23 DIAGNOSIS — Z01812 Encounter for preprocedural laboratory examination: Secondary | ICD-10-CM | POA: Insufficient documentation

## 2015-06-23 DIAGNOSIS — K432 Incisional hernia without obstruction or gangrene: Secondary | ICD-10-CM | POA: Diagnosis not present

## 2015-06-23 HISTORY — DX: Chronic kidney disease, unspecified: N18.9

## 2015-06-23 LAB — BASIC METABOLIC PANEL
Anion gap: 9 (ref 5–15)
BUN: 11 mg/dL (ref 6–20)
CALCIUM: 9.8 mg/dL (ref 8.9–10.3)
CO2: 27 mmol/L (ref 22–32)
Chloride: 102 mmol/L (ref 101–111)
Creatinine, Ser: 0.7 mg/dL (ref 0.44–1.00)
GFR calc Af Amer: >60 mL/min (ref >60)
GLUCOSE: 98 mg/dL (ref 65–99)
POTASSIUM: 3.7 mmol/L (ref 3.5–5.1)
SODIUM: 138 mmol/L (ref 135–145)

## 2015-06-23 LAB — CBC
HCT: 40.6 % (ref 36.0–46.0)
Hemoglobin: 13.3 g/dL (ref 12.0–15.0)
MCH: 26.9 pg (ref 26.0–34.0)
MCHC: 32.8 g/dL (ref 30.0–36.0)
MCV: 82.2 fL (ref 78.0–100.0)
PLATELETS: 200 10*3/uL (ref 150–400)
RBC: 4.94 MIL/uL (ref 3.87–5.11)
RDW: 15.3 % (ref 11.5–15.5)
WBC: 7.3 10*3/uL (ref 4.0–10.5)

## 2015-06-23 NOTE — Progress Notes (Signed)
Pt. Denies any cardiac , chest complaints. Pt. Was seen by Dr. Lorel Monaco told to f/u in one yr. But since then Dr. Addison Lank told her that she was monitoring her medical needs & that she could follow up with a bigger window. Pt. Reports that she will see Dr. Marlou Porch as needed & within 3 yrs. Of when she was seen last.

## 2015-06-24 NOTE — Progress Notes (Addendum)
10:45 AM    Left message for Evelyn Campbell @ [facility] regarding the amoxicillin allergy and Dr. Trevor Mace order for Ancef.   DA Received return call from [facility].  Antibiotic to stay as ordered per Dr. Ninfa Linden  Da

## 2015-07-01 MED ORDER — CEFAZOLIN SODIUM-DEXTROSE 2-3 GM-% IV SOLR
2.0000 g | INTRAVENOUS | Status: AC
  Administered 2015-07-02: 2 g via INTRAVENOUS
  Filled 2015-07-01: qty 50

## 2015-07-01 NOTE — H&P (Signed)
Evelyn Campbell is an 59 y.o. female.   Chief Complaint: Incisional hernia HPI: This patient is almost 1 year status post laparoscopic adrenalectomy, repair of umbilical hernia with mesh, and bilateral laparoscopic inguinal hernia repair with mesh. She is now developed a recurrent hernia at the incision above the umbilicus. She has no obstructive symptoms but has a reducible, somewhat uncomfortable bulge. She is otherwise without complaints  Past Medical History  Diagnosis Date  . Hypertension   . Hyperlipidemia   . Fatty liver   . Vitamin D deficiency   . Peripheral neuropathy   . GERD (gastroesophageal reflux disease)     occ  . Pneumonia 1980's    hx- not needed hospitalization   . Chronic kidney disease     seen by Nephrology at [facility], due to all her numbers were"off", she  remarks that she was sent to superhydrate & then her labs values corrected.  Pt. will f/u /w nephrology  . Arthritis     R ankle     Past Surgical History  Procedure Laterality Date  . Abdominal hysterectomy  99  . Cholecystectomy  10  . Toe surgery Bilateral 2012    hammer toe  . Ankle fusion Right 6/15  . Pancreatectomy N/A 08/20/2014    Procedure: LAPAROSCOPIC ADRENALECTOMY;  Surgeon: Stark Klein, MD;  Location: [place];  Service: General;  Laterality: N/A;  . Inguinal hernia repair Bilateral 08/20/2014    Procedure: LAPAROSCOPIC BILATERAL INGUINAL HERNIA REPAIR WITH MESH;  Surgeon: Coralie Keens, MD;  Location: [place];  Service: General;  Laterality: Bilateral;  . Umbilical hernia repair N/A 08/20/2014    Procedure: HERNIA REPAIR UMBILICAL ADULT;  Surgeon: Coralie Keens, MD;  Location: [place];  Service: General;  Laterality: N/A;  . Insertion of mesh N/A 08/20/2014    Procedure: INSERTION OF MESH;  Surgeon: Coralie Keens, MD;  Location: [place];  Service: General;  Laterality: N/A;  . Vaginal delivery  1994  . Hernia repair    . Tonsillectomy    . Dental surgery      implant- upper R      Family History  Problem Relation Age of Onset  . Heart failure Father    Social History:  reports that she has never smoked. She does not have any smokeless tobacco history on file. She reports that she drinks alcohol. She reports that she does not use illicit drugs.  Allergies:  Allergies  Allergen Reactions  . Celebrex [Celecoxib] Diarrhea and Other (See Comments)    Cause bloody stools  . Ibuprofen Diarrhea and Other (See Comments)    Caused bloody stools  . Meloxicam Diarrhea and Other (See Comments)    Caused bloody stools  . Lipitor [Atorvastatin] Other (See Comments)    myalgia  . Statins Other (See Comments)    myalgia  . Amoxicillin Itching and Rash    No prescriptions prior to admission    No results found for this or any previous visit (from the past 48 hour(s)). No results found.  Review of Systems  All other systems reviewed and are negative.   There were no vitals taken for this visit. Physical Exam  Constitutional: She is oriented to person, place, and time. She appears well-developed and well-nourished. No distress.  HENT:  Head: Normocephalic and atraumatic.  Right Ear: External ear normal.  Left Ear: External ear normal.  Mouth/Throat: No oropharyngeal exudate.  Eyes: Conjunctivae are normal. Pupils are equal, round, and reactive to light. No scleral icterus.  Neck: Normal range of motion. No tracheal deviation present.  Cardiovascular: Normal rate, regular rhythm and normal heart sounds.   Respiratory: Effort normal and breath sounds normal. No respiratory distress.  GI: Soft. She exhibits no distension. There is no tenderness. There is no rebound.  Reducible incisional hernia  Musculoskeletal: Normal range of motion. She exhibits no edema or tenderness.  Neurological: She is alert and oriented to person, place, and time.  Skin: Skin is warm and dry.  Psychiatric: Her behavior is normal. Judgment normal.     Assessment/Plan Incisional  hernia  After discussion with the patient and her husband we will proceed to the operating room for a laparoscopic incisional hernia repair with mesh. I discussed the risks of the surgery with him in detail. These include but are not limited to bleeding, infection, injury to surrounding structures, the need to convert to an open procedure, recurrence, etc. They understand and wish to proceed with surgery which is scheduled  Sarita Hakanson A 07/01/2015, 12:49 PM

## 2015-07-02 ENCOUNTER — Ambulatory Visit (HOSPITAL_COMMUNITY): Payer: BC Managed Care – PPO | Admitting: Certified Registered Nurse Anesthetist

## 2015-07-02 ENCOUNTER — Encounter (HOSPITAL_COMMUNITY)
Admission: RE | Disposition: A | Discharge status: Home / Self Care | Payer: Self-pay | Source: Ambulatory Visit | Attending: Surgery

## 2015-07-02 ENCOUNTER — Encounter (HOSPITAL_COMMUNITY): Payer: Self-pay | Admitting: *Deleted

## 2015-07-02 ENCOUNTER — Observation Stay (HOSPITAL_COMMUNITY)
Admission: RE | Admit: 2015-07-02 | Discharge: 2015-07-04 | Disposition: A | Discharge status: Home / Self Care | Payer: BC Managed Care – PPO | Source: Ambulatory Visit | Attending: Surgery | Admitting: Surgery

## 2015-07-02 DIAGNOSIS — N189 Chronic kidney disease, unspecified: Secondary | ICD-10-CM | POA: Diagnosis not present

## 2015-07-02 DIAGNOSIS — K432 Incisional hernia without obstruction or gangrene: Secondary | ICD-10-CM | POA: Diagnosis present

## 2015-07-02 DIAGNOSIS — Z6839 Body mass index (BMI) 39.0-39.9, adult: Secondary | ICD-10-CM | POA: Insufficient documentation

## 2015-07-02 DIAGNOSIS — I129 Hypertensive chronic kidney disease with stage 1 through stage 4 chronic kidney disease, or unspecified chronic kidney disease: Secondary | ICD-10-CM | POA: Diagnosis not present

## 2015-07-02 DIAGNOSIS — E785 Hyperlipidemia, unspecified: Secondary | ICD-10-CM | POA: Diagnosis not present

## 2015-07-02 HISTORY — PX: INCISIONAL HERNIA REPAIR: SHX193

## 2015-07-02 HISTORY — PX: INSERTION OF MESH: SHX5868

## 2015-07-02 SURGERY — REPAIR, HERNIA, INCISIONAL, LAPAROSCOPIC
Anesthesia: General | Site: Abdomen

## 2015-07-02 MED ORDER — MIDAZOLAM HCL 2 MG/2ML IJ SOLN
INTRAMUSCULAR | Status: AC
  Filled 2015-07-02: qty 4

## 2015-07-02 MED ORDER — GABAPENTIN 300 MG PO CAPS
900.0000 mg | ORAL_CAPSULE | Freq: Every day | ORAL | Status: DC
  Administered 2015-07-02 – 2015-07-03 (×2): 900 mg via ORAL
  Filled 2015-07-02 (×2): qty 3

## 2015-07-02 MED ORDER — ACETAMINOPHEN 650 MG RE SUPP: 650.0000 mg | Freq: Four times a day (QID) | RECTAL | Status: DC | PRN

## 2015-07-02 MED ORDER — MEPERIDINE HCL 25 MG/ML IJ SOLN: 6.2500 mg | INTRAMUSCULAR | Status: DC | PRN

## 2015-07-02 MED ORDER — ROCURONIUM BROMIDE 50 MG/5ML IV SOLN
INTRAVENOUS | Status: AC
  Filled 2015-07-02: qty 1

## 2015-07-02 MED ORDER — BUPIVACAINE-EPINEPHRINE 0.25% -1:200000 IJ SOLN
INTRAMUSCULAR | Status: DC | PRN
  Administered 2015-07-02: 20 mL

## 2015-07-02 MED ORDER — ONDANSETRON HCL 4 MG/2ML IJ SOLN
INTRAMUSCULAR | Status: DC | PRN
  Administered 2015-07-02: 4 mg via INTRAVENOUS

## 2015-07-02 MED ORDER — PHENYLEPHRINE 40 MCG/ML (10ML) SYRINGE FOR IV PUSH (FOR BLOOD PRESSURE SUPPORT)
PREFILLED_SYRINGE | INTRAVENOUS | Status: AC
  Filled 2015-07-02: qty 10

## 2015-07-02 MED ORDER — LIDOCAINE HCL (CARDIAC) 20 MG/ML IV SOLN
INTRAVENOUS | Status: DC | PRN
  Administered 2015-07-02: 100 mg via INTRAVENOUS

## 2015-07-02 MED ORDER — ONDANSETRON HCL 4 MG/2ML IJ SOLN: 4.0000 mg | Freq: Once | INTRAMUSCULAR | Status: DC | PRN

## 2015-07-02 MED ORDER — LACTATED RINGERS IV SOLN
INTRAVENOUS | Status: DC
  Administered 2015-07-02: 09:00:00 via INTRAVENOUS

## 2015-07-02 MED ORDER — HYDROMORPHONE HCL 1 MG/ML IJ SOLN: 0.2500 mg | INTRAMUSCULAR | Status: DC | PRN

## 2015-07-02 MED ORDER — LACTATED RINGERS IV SOLN
INTRAVENOUS | Status: DC | PRN
  Administered 2015-07-02 (×2): via INTRAVENOUS

## 2015-07-02 MED ORDER — DEXAMETHASONE SODIUM PHOSPHATE 4 MG/ML IJ SOLN
INTRAMUSCULAR | Status: DC | PRN
  Administered 2015-07-02: 4 mg via INTRAVENOUS

## 2015-07-02 MED ORDER — ACETAMINOPHEN 325 MG PO TABS: 650.0000 mg | ORAL_TABLET | Freq: Four times a day (QID) | ORAL | Status: DC | PRN

## 2015-07-02 MED ORDER — BUPIVACAINE-EPINEPHRINE (PF) 0.25% -1:200000 IJ SOLN
INTRAMUSCULAR | Status: AC
  Filled 2015-07-02: qty 30

## 2015-07-02 MED ORDER — ATENOLOL 50 MG PO TABS
100.0000 mg | ORAL_TABLET | Freq: Every day | ORAL | Status: DC
  Administered 2015-07-02 – 2015-07-03 (×2): 100 mg via ORAL
  Filled 2015-07-02 (×2): qty 2

## 2015-07-02 MED ORDER — ENOXAPARIN SODIUM 40 MG/0.4ML ~~LOC~~ SOLN
40.0000 mg | SUBCUTANEOUS | Status: DC
  Administered 2015-07-04: 40 mg via SUBCUTANEOUS
  Filled 2015-07-02 (×2): qty 0.4

## 2015-07-02 MED ORDER — AMLODIPINE BESYLATE 5 MG PO TABS
5.0000 mg | ORAL_TABLET | Freq: Every day | ORAL | Status: DC
  Administered 2015-07-02 – 2015-07-04 (×3): 5 mg via ORAL
  Filled 2015-07-02 (×3): qty 1

## 2015-07-02 MED ORDER — SUGAMMADEX SODIUM 200 MG/2ML IV SOLN
INTRAVENOUS | Status: AC
  Filled 2015-07-02: qty 2

## 2015-07-02 MED ORDER — ONDANSETRON HCL 4 MG/2ML IJ SOLN: 4.0000 mg | Freq: Four times a day (QID) | INTRAMUSCULAR | Status: DC | PRN

## 2015-07-02 MED ORDER — EPHEDRINE SULFATE 50 MG/ML IJ SOLN
INTRAMUSCULAR | Status: DC | PRN
  Administered 2015-07-02: 10 mg via INTRAVENOUS
  Administered 2015-07-02 (×2): 5 mg via INTRAVENOUS
  Administered 2015-07-02 (×2): 10 mg via INTRAVENOUS
  Administered 2015-07-02 (×2): 5 mg via INTRAVENOUS

## 2015-07-02 MED ORDER — FENTANYL CITRATE (PF) 250 MCG/5ML IJ SOLN
INTRAMUSCULAR | Status: AC
  Filled 2015-07-02: qty 5

## 2015-07-02 MED ORDER — DEXAMETHASONE SODIUM PHOSPHATE 4 MG/ML IJ SOLN
INTRAMUSCULAR | Status: AC
  Filled 2015-07-02: qty 1

## 2015-07-02 MED ORDER — ONDANSETRON HCL 4 MG/2ML IJ SOLN
INTRAMUSCULAR | Status: AC
  Filled 2015-07-02: qty 2

## 2015-07-02 MED ORDER — OMEPRAZOLE MAGNESIUM 20 MG PO TBEC: 20.0000 mg | DELAYED_RELEASE_TABLET | Freq: Every day | ORAL | Status: DC

## 2015-07-02 MED ORDER — POTASSIUM CHLORIDE IN NACL 20-0.9 MEQ/L-% IV SOLN
INTRAVENOUS | Status: DC
  Administered 2015-07-02 (×2): via INTRAVENOUS
  Filled 2015-07-02 (×2): qty 1000

## 2015-07-02 MED ORDER — ONDANSETRON 4 MG PO TBDP: 4.0000 mg | ORAL_TABLET | Freq: Four times a day (QID) | ORAL | Status: DC | PRN

## 2015-07-02 MED ORDER — PANTOPRAZOLE SODIUM 40 MG PO TBEC
40.0000 mg | DELAYED_RELEASE_TABLET | Freq: Every day | ORAL | Status: DC
  Administered 2015-07-02 – 2015-07-04 (×3): 40 mg via ORAL
  Filled 2015-07-02 (×3): qty 1

## 2015-07-02 MED ORDER — MIDAZOLAM HCL 5 MG/5ML IJ SOLN
INTRAMUSCULAR | Status: DC | PRN
  Administered 2015-07-02: 2 mg via INTRAVENOUS

## 2015-07-02 MED ORDER — EPHEDRINE SULFATE 50 MG/ML IJ SOLN
INTRAMUSCULAR | Status: AC
  Filled 2015-07-02: qty 1

## 2015-07-02 MED ORDER — 0.9 % SODIUM CHLORIDE (POUR BTL) OPTIME
TOPICAL | Status: DC | PRN
  Administered 2015-07-02: 1000 mL

## 2015-07-02 MED ORDER — OXYCODONE HCL 5 MG PO TABS
5.0000 mg | ORAL_TABLET | ORAL | Status: DC | PRN
  Administered 2015-07-02: 5 mg via ORAL
  Administered 2015-07-03 (×2): 10 mg via ORAL
  Administered 2015-07-03: 5 mg via ORAL
  Administered 2015-07-03 – 2015-07-04 (×3): 10 mg via ORAL
  Filled 2015-07-02 (×2): qty 2
  Filled 2015-07-02 (×2): qty 1
  Filled 2015-07-02 (×2): qty 2
  Filled 2015-07-02: qty 1
  Filled 2015-07-02: qty 2

## 2015-07-02 MED ORDER — KETOROLAC TROMETHAMINE 30 MG/ML IJ SOLN
INTRAMUSCULAR | Status: DC | PRN
  Administered 2015-07-02: 30 mg via INTRAVENOUS

## 2015-07-02 MED ORDER — KETOROLAC TROMETHAMINE 30 MG/ML IJ SOLN
INTRAMUSCULAR | Status: AC
  Filled 2015-07-02: qty 1

## 2015-07-02 MED ORDER — HYDROCHLOROTHIAZIDE 25 MG PO TABS
12.5000 mg | ORAL_TABLET | Freq: Every day | ORAL | Status: DC
  Administered 2015-07-02 – 2015-07-04 (×3): 12.5 mg via ORAL
  Filled 2015-07-02 (×3): qty 1

## 2015-07-02 MED ORDER — LIDOCAINE HCL (CARDIAC) 20 MG/ML IV SOLN
INTRAVENOUS | Status: AC
  Filled 2015-07-02: qty 5

## 2015-07-02 MED ORDER — DOCUSATE SODIUM 100 MG PO CAPS
200.0000 mg | ORAL_CAPSULE | Freq: Every day | ORAL | Status: DC
  Administered 2015-07-02 – 2015-07-04 (×3): 200 mg via ORAL
  Filled 2015-07-02 (×3): qty 2

## 2015-07-02 MED ORDER — ROCURONIUM BROMIDE 100 MG/10ML IV SOLN
INTRAVENOUS | Status: DC | PRN
Start: 2015-07-02 — End: 2015-07-02
  Administered 2015-07-02: 50 mg via INTRAVENOUS
  Administered 2015-07-02: 10 mg via INTRAVENOUS

## 2015-07-02 MED ORDER — PROPOFOL 10 MG/ML IV BOLUS
INTRAVENOUS | Status: DC | PRN
Start: 2015-07-02 — End: 2015-07-02
  Administered 2015-07-02: 15 mg via INTRAVENOUS
  Administered 2015-07-02: 50 mg via INTRAVENOUS

## 2015-07-02 MED ORDER — MORPHINE SULFATE (PF) 2 MG/ML IV SOLN: 1.0000 mg | INTRAVENOUS | Status: DC | PRN

## 2015-07-02 MED ORDER — STERILE WATER FOR INJECTION IJ SOLN
INTRAMUSCULAR | Status: AC
  Filled 2015-07-02: qty 10

## 2015-07-02 MED ORDER — FENTANYL CITRATE (PF) 100 MCG/2ML IJ SOLN
INTRAMUSCULAR | Status: DC | PRN
  Administered 2015-07-02: 150 ug via INTRAVENOUS
  Administered 2015-07-02: 50 ug via INTRAVENOUS

## 2015-07-02 MED ORDER — PHENYLEPHRINE HCL 10 MG/ML IJ SOLN
INTRAMUSCULAR | Status: DC | PRN
  Administered 2015-07-02 (×2): 40 ug via INTRAVENOUS

## 2015-07-02 MED ORDER — SUGAMMADEX SODIUM 200 MG/2ML IV SOLN
INTRAVENOUS | Status: DC | PRN
  Administered 2015-07-02: 200 mg via INTRAVENOUS

## 2015-07-02 SURGICAL SUPPLY — 46 items
APPLIER CLIP 5 13 M/L LIGAMAX5 (MISCELLANEOUS)
APPLIER CLIP ROT 10 11.4 M/L (STAPLE)
BLADE SURG ROTATE 9660 (MISCELLANEOUS) IMPLANT
CANISTER SUCTION 2500CC (MISCELLANEOUS) IMPLANT
CHLORAPREP W/TINT 26ML (MISCELLANEOUS) ×4 IMPLANT
CLIP APPLIE 5 13 M/L LIGAMAX5 (MISCELLANEOUS) IMPLANT
CLIP APPLIE ROT 10 11.4 M/L (STAPLE) IMPLANT
COVER SURGICAL LIGHT HANDLE (MISCELLANEOUS) ×4 IMPLANT
DECANTER SPIKE VIAL GLASS SM (MISCELLANEOUS) ×4 IMPLANT
DEVICE SECURE STRAP 25 ABSORB (INSTRUMENTS) ×8 IMPLANT
DEVICE TROCAR PUNCTURE CLOSURE (ENDOMECHANICALS) ×4 IMPLANT
DRAPE LAPAROSCOPIC ABDOMINAL (DRAPES) ×4 IMPLANT
ELECT REM PT RETURN 9FT ADLT (ELECTROSURGICAL) ×4
ELECTRODE REM PT RTRN 9FT ADLT (ELECTROSURGICAL) ×2 IMPLANT
GLOVE BIOGEL PI IND STRL 7.0 (GLOVE) ×2 IMPLANT
GLOVE BIOGEL PI IND STRL 7.5 (GLOVE) ×2 IMPLANT
GLOVE BIOGEL PI INDICATOR 7.0 (GLOVE) ×2
GLOVE BIOGEL PI INDICATOR 7.5 (GLOVE) ×2
GLOVE SURG SIGNA 7.5 PF LTX (GLOVE) ×4 IMPLANT
GLOVE SURG SS PI 7.0 STRL IVOR (GLOVE) ×4 IMPLANT
GLOVE SURG SS PI 7.5 STRL IVOR (GLOVE) ×4 IMPLANT
GOWN STRL REUS W/ TWL LRG LVL3 (GOWN DISPOSABLE) ×4 IMPLANT
GOWN STRL REUS W/ TWL XL LVL3 (GOWN DISPOSABLE) ×2 IMPLANT
GOWN STRL REUS W/TWL LRG LVL3 (GOWN DISPOSABLE) ×4
GOWN STRL REUS W/TWL XL LVL3 (GOWN DISPOSABLE) ×2
KIT BASIN OR (CUSTOM PROCEDURE TRAY) ×4 IMPLANT
KIT ROOM TURNOVER OR (KITS) ×4 IMPLANT
LIQUID BAND (GAUZE/BANDAGES/DRESSINGS) ×4 IMPLANT
MARKER SKIN DUAL TIP RULER LAB (MISCELLANEOUS) ×4 IMPLANT
MESH VENTRALIGHT ST 6IN CRC (Mesh General) ×4 IMPLANT
NEEDLE SPNL 22GX3.5 QUINCKE BK (NEEDLE) ×4 IMPLANT
NS IRRIG 1000ML POUR BTL (IV SOLUTION) ×4 IMPLANT
PAD ARMBOARD 7.5X6 YLW CONV (MISCELLANEOUS) ×8 IMPLANT
SCALPEL HARMONIC ACE (MISCELLANEOUS) ×4 IMPLANT
SCISSORS LAP 5X35 DISP (ENDOMECHANICALS) IMPLANT
SET IRRIG TUBING LAPAROSCOPIC (IRRIGATION / IRRIGATOR) IMPLANT
SLEEVE ENDOPATH XCEL 5M (ENDOMECHANICALS) ×8 IMPLANT
SUT MON AB 4-0 PC3 18 (SUTURE) ×4 IMPLANT
SUT NOVA NAB GS-21 0 18 T12 DT (SUTURE) ×4 IMPLANT
TOWEL OR 17X24 6PK STRL BLUE (TOWEL DISPOSABLE) ×4 IMPLANT
TOWEL OR 17X26 10 PK STRL BLUE (TOWEL DISPOSABLE) ×4 IMPLANT
TRAY FOLEY CATH 16FR SILVER (SET/KITS/TRAYS/PACK) IMPLANT
TRAY LAPAROSCOPIC MC (CUSTOM PROCEDURE TRAY) ×4 IMPLANT
TROCAR XCEL NON-BLD 11X100MML (ENDOMECHANICALS) ×4 IMPLANT
TROCAR XCEL NON-BLD 5MMX100MML (ENDOMECHANICALS) ×4 IMPLANT
TUBING INSUFFLATION (TUBING) ×4 IMPLANT

## 2015-07-02 NOTE — Op Note (Signed)
LAPAROSCOPIC INCISIONAL HERNIA, INSERTION OF MESH  Procedure Note  Evelyn Campbell 07/02/2015   Pre-op Diagnosis: Incisional Hernia     Post-op Diagnosis: same  Procedure(s): LAPAROSCOPIC INCISIONAL HERNIA INSERTION OF MESH  Surgeon(s): Coralie Keens, MD  Anesthesia: General  Staff:  Circulator: Shelva Majestic, RN; Cyd Silence, RN Scrub Person: Jesse Sans, CST; Jenelle Mages Panchit; Candie Mile, RN  Estimated Blood Loss: Minimal               Indications: This patient presents with a recurrent incisional hernia. Plan will be to proceed with laparoscopic repair with mesh  Findings: The patient had both omentum and transverse colon adhered to the hernia sac but was easily reducible. The hernia was repaired with a 15 cm round ventralite ST patch from [employer].  Procedure: The patient was brought to the operating room and identified as the correct patient. She was placed supine on the operating room table and general anesthesia was induced. Her abdomen was then prepped and draped in the usual sterile fashion. I made a small incision with a scalpel in the right upper quadrant. I then used the 5 mm trocar and Optiview camera to slowly traverse all layers of the abdominal wall and gain entrance into the peritoneal cavity. Insufflation of the abdomen was then begun. I examined the trocar entrance site and saw no evidence of bowel injury. I then placed a 11 mm trocar in the mid right abdomen and a 5 mm trocar in the right lower quadrant.  The patient was found to have the previous mesh intact but the hernia had extended superiorly. Using the harmonic scalpel, I was able to reduce the omentum and excise it from the previous mesh and hernia site. I also had reduced transverse colon from the edge of the fascial defect as well. I saw no evidence of bowel injury. I measured the fascial defect. I brought a 15 cm round Bard ventralite piece of mesh onto the field. This allowed for greater than 4  cm circumferential overlap. I placed 4 separate 0 Novafil sutures and the corners of the mesh. I rolled the mesh up and soaked in saline and then introduced into the abdominal cavity through one of the trochars. I then opened up the mesh. I make 4 separate stab incisions and used the suture passer to pull the sutures up through the abdominal wall under direct vision. These were then tied in place point the mesh up to the abdominal wall widely covering the fascial defect. I then used the absorbable tacker to tack the mesh and circumferentially. I had to place an 5 mm trocar in the left lower quadrant in order to completely circumferentially tacked the mesh. Again, the mesh appeared to lay appropriately. Hemostasis appeared to be achieved. Again, I saw no evidence of bowel injury. All trochars removed under direct vision and the abdomen was deflated. All incisions were then anesthetized Marcaine and closed with 4-0 Monocryl sutures and skin glue. The patient tolerated the procedure well. All counts were correct at the end of the procedure. The patient was then extubated in the operating room and taken in a stable condition to the recovery room.          Evelyn Campbell A   Date: 07/02/2015  Time: 11:24 AM

## 2015-07-02 NOTE — Interval H&P Note (Signed)
History and Physical Interval Note:no change in H and P  07/02/2015 8:12 AM  [name]  has presented today for surgery, with the diagnosis of Incisional Hernia  The various methods of treatment have been discussed with the patient and family. After consideration of risks, benefits and other options for treatment, the patient has consented to  Procedure(s): LAPAROSCOPIC INCISIONAL HERNIA (N/Campbell) INSERTION OF MESH (N/Campbell) as Campbell surgical intervention .  The patient's history has been reviewed, patient examined, no change in status, stable for surgery.  I have reviewed the patient's chart and labs.  Questions were answered to the patient's satisfaction.     Evelyn Campbell

## 2015-07-02 NOTE — Anesthesia Preprocedure Evaluation (Addendum)
Anesthesia Evaluation  Patient identified by MRN, date of birth, ID band Patient awake    Reviewed: Allergy & Precautions, NPO status , Patient's Chart, lab work & pertinent test results  Airway Mallampati: I  TM Distance: >3 FB Neck ROM: Full    Dental  (+) Dental Advisory Given, Teeth Intact   Pulmonary    Pulmonary exam normal       Cardiovascular hypertension, Pt. on medications and Pt. on home beta blockers Normal cardiovascular exam    Neuro/Psych    GI/Hepatic GERD-  Medicated,  Endo/Other  Morbid obesity  Renal/GU      Musculoskeletal  (+) Arthritis -,   Abdominal   Peds  Hematology   Anesthesia Other Findings   Reproductive/Obstetrics                          Anesthesia Physical Anesthesia Plan  ASA: III  Anesthesia Plan: General   Post-op Pain Management:    Induction: Intravenous  Airway Management Planned: Oral ETT  Additional Equipment:   Intra-op Plan:   Post-operative Plan: Extubation in OR  Informed Consent: I have reviewed the patients History and Physical, chart, labs and discussed the procedure including the risks, benefits and alternatives for the proposed anesthesia with the patient or authorized representative who has indicated his/her understanding and acceptance.   Dental advisory given  Plan Discussed with: CRNA and Surgeon  Anesthesia Plan Comments:       Anesthesia Quick Evaluation

## 2015-07-02 NOTE — Transfer of Care (Signed)
Immediate Anesthesia Transfer of Care Note  Patient: Evelyn Campbell  Procedure(s) Performed: Procedure(s): LAPAROSCOPIC INCISIONAL HERNIA (N/A) INSERTION OF MESH (N/A)  Patient Location: PACU  Anesthesia Type:General  Level of Consciousness: awake, alert  and oriented  Airway & Oxygen Therapy: Patient Spontanous Breathing and Patient connected to nasal cannula oxygen  Post-op Assessment: Report given to RN, Post -op Vital signs reviewed and stable and Patient moving all extremities X 4  Post vital signs: Reviewed and stable  Last Vitals:  Filed Vitals:   07/02/15 0846  BP: 126/72  Pulse: 62  Temp: 36.5 C  Resp: 20    Complications: No apparent anesthesia complications

## 2015-07-02 NOTE — Anesthesia Procedure Notes (Signed)
Procedure Name: Intubation Date/Time: 07/02/2015 10:15 AM Performed by: Garrison Columbus T Pre-anesthesia Checklist: Patient identified, Emergency Drugs available, Suction available and Patient being monitored Patient Re-evaluated:Patient Re-evaluated prior to inductionOxygen Delivery Method: Circle system utilized Preoxygenation: Pre-oxygenation with 100% oxygen Intubation Type: IV induction Ventilation: Mask ventilation without difficulty Laryngoscope Size: Miller and 2 Grade View: Grade I Tube type: Oral Tube size: 7.5 mm Number of attempts: 1 Airway Equipment and Method: Stylet Placement Confirmation: ETT inserted through vocal cords under direct vision,  positive ETCO2 and breath sounds checked- equal and bilateral Secured at: 22 cm Tube secured with: Tape Dental Injury: Teeth and Oropharynx as per pre-operative assessment

## 2015-07-02 NOTE — Anesthesia Postprocedure Evaluation (Signed)
Anesthesia Post Note  Patient: Evelyn Campbell  Procedure(s) Performed: Procedure(s) (LRB): LAPAROSCOPIC INCISIONAL HERNIA (N/A) INSERTION OF MESH (N/A)  Anesthesia type: General  Patient location: PACU  Post pain: Pain level controlled  Post assessment: Post-op Vital signs reviewed  Last Vitals: BP 118/67 mmHg  Pulse 64  Temp(Src) 36.3 C (Oral)  Resp 12  Ht 5\' 5"  (1.651 m)  Wt 238 lb 12.8 oz (108.319 kg)  BMI 39.74 kg/m2  SpO2 93%  Post vital signs: Reviewed  Level of consciousness: sedated  Complications: No apparent anesthesia complications

## 2015-07-03 ENCOUNTER — Encounter (HOSPITAL_COMMUNITY): Payer: Self-pay | Admitting: Surgery

## 2015-07-03 DIAGNOSIS — K432 Incisional hernia without obstruction or gangrene: Secondary | ICD-10-CM | POA: Diagnosis not present

## 2015-07-03 MED ORDER — SODIUM CHLORIDE 0.9 % IV SOLN: INTRAVENOUS | Status: DC

## 2015-07-03 NOTE — Progress Notes (Signed)
1 Day Post-Op  Subjective: Complains of incisional pain Come difficulty getting out of bed  Objective: Vital signs in last 24 hours: Temp:  [97.3 F (36.3 C)-97.9 F (36.6 C)] 97.7 F (36.5 C) (09/01 0550) Pulse Rate:  [59-67] 63 (09/01 0550) Resp:  [10-20] 16 (09/01 0550) BP: (98-126)/(53-89) 103/57 mmHg (09/01 0550) SpO2:  [87 %-100 %] 97 % (09/01 0550) Weight:  [108.319 kg (238 lb 12.8 oz)] 108.319 kg (238 lb 12.8 oz) (08/31 0837) Last BM Date: 06/29/15  Intake/Output from previous day: 08/31 0701 - 09/01 0700 In: 3368.3 [P.O.:500; I.V.:2868.3] Out: 530 [Urine:500; Blood:30] Intake/Output this shift:   Abdomen soft, mildly tender  Lab Results:  No results for input(s): WBC, HGB, HCT, PLT in the last 72 hours. BMET No results for input(s): NA, K, CL, CO2, GLUCOSE, BUN, CREATININE, CALCIUM in the last 72 hours. PT/INR No results for input(s): LABPROT, INR in the last 72 hours. ABG No results for input(s): PHART, HCO3 in the last 72 hours.  Invalid input(s): PCO2, PO2  Studies/Results: No results found.  Anti-infectives: Anti-infectives    Start     Dose/Rate Route Frequency Ordered Stop   07/02/15 0930  ceFAZolin (ANCEF) IVPB 2 g/50 mL premix     2 g 100 mL/hr over 30 Minutes Intravenous To ShortStay Procedural 07/01/15 1158 07/02/15 1020      Assessment/Plan: s/p Procedure(s): LAPAROSCOPIC INCISIONAL HERNIA (N/A) INSERTION OF MESH (N/A)  Continue pain control Ambulate Discharge later this afternoon vs tomorrow depending on pain control     Ladelle Teodoro A 07/03/2015

## 2015-07-04 DIAGNOSIS — K432 Incisional hernia without obstruction or gangrene: Secondary | ICD-10-CM | POA: Diagnosis not present

## 2015-07-04 MED ORDER — OXYCODONE HCL 5 MG PO TABS: 5.0000 mg | ORAL_TABLET | ORAL | Status: DC | PRN

## 2015-07-04 NOTE — Progress Notes (Signed)
Patient ID: Evelyn Campbell, female   DOB: 10-14-1956, 59 y.o.   MRN: [id number]  Feels better Less pain, able to get out of bed better Abdomen soft  Plan: Discharge home

## 2015-07-04 NOTE — Discharge Instructions (Signed)
[facility] ______[facility] LAPAROSCOPIC SURGERY: POST OP INSTRUCTIONS Always review your discharge instruction sheet given to you by the facility where your surgery was performed. IF YOU HAVE DISABILITY OR FAMILY LEAVE FORMS, YOU MUST BRING THEM TO THE OFFICE FOR PROCESSING.   DO NOT GIVE THEM TO YOUR DOCTOR.  1. A prescription for pain medication may be given to you upon discharge.  Take your pain medication as prescribed, if needed.  If narcotic pain medicine is not needed, then you may take acetaminophen (Tylenol) or ibuprofen (Advil) as needed. 2. Take your usually prescribed medications unless otherwise directed. 3. If you need a refill on your pain medication, please contact your pharmacy.  They will contact our office to request authorization. Prescriptions will not be filled after 5pm or on week-ends. 4. You should follow a light diet the first few days after arrival home, such as soup and crackers, etc.  Be sure to include lots of fluids daily. 5. Most patients will experience some swelling and bruising in the area of the incisions.  Ice packs will help.  Swelling and bruising can take several days to resolve.  6. It is common to experience some constipation if taking pain medication after surgery.  Increasing fluid intake and taking a stool softener (such as Colace) will usually help or prevent this problem from occurring.  A mild laxative (Milk of Magnesia or Miralax) should be taken according to package instructions if there are no bowel movements after 48 hours. 7. Unless discharge instructions indicate otherwise, you may remove your bandages 24-48 hours after surgery, and you may shower at that time.  You may have steri-strips (small skin tapes) in place directly over the incision.  These strips should be left on the skin for 7-10 days.  If your surgeon used skin glue on the incision, you may shower in 24 hours.  The glue will flake off over the next 2-3 weeks.  Any sutures or  staples will be removed at the office during your follow-up visit. 8. ACTIVITIES:  You may resume regular (light) daily activities beginning the next day--such as daily self-care, walking, climbing stairs--gradually increasing activities as tolerated.  You may have sexual intercourse when it is comfortable.  Refrain from any heavy lifting or straining until approved by your doctor. a. You may drive when you are no longer taking prescription pain medication, you can comfortably wear a seatbelt, and you can safely maneuver your car and apply brakes. b. RETURN TO WORK:  __________________________________________________________ 9. You should see your doctor in the office for a follow-up appointment approximately 2-3 weeks after your surgery.  Make sure that you call for this appointment within a day or two after you arrive home to insure a convenient appointment time. 10. OTHER INSTRUCTIONS: _NO LIFTING MORE THAN 15 POUNDS FOR  4 WEEKS. 11. _________________________________________________________________________________________________________________________ __________________________________________________________________________________________________________________________ WHEN TO CALL YOUR DOCTOR: 1. Fever over 101.0 2. Inability to urinate 3. Continued bleeding from incision. 4. Increased pain, redness, or drainage from the incision. 5. Increasing abdominal pain  The clinic staff is available to answer your questions during regular business hours.  Please dont hesitate to call and ask to speak to one of the nurses for clinical concerns.  If you have a medical emergency, go to the nearest emergency room or call 911.  A surgeon from [facility] is always on call at the hospital. [address] [place], [place], [place]  [address] ? [address] [address], [place], [place]  [address] [phone] ? [phone] ? FAX [phone] Web site: [email or web address]

## 2015-07-04 NOTE — Discharge Summary (Signed)
Physician Discharge Summary  Patient ID: Evelyn Campbell MRN: [id number] DOB/AGE: Dec 02, 1955 59 y.o.  Admit date: 07/02/2015 Discharge date: 07/04/2015  Admission Diagnoses:  Discharge Diagnoses:  Active Problems:   Incisional hernia   Discharged Condition: good  Hospital Course: uneventful post op recovery except kept one extra day for IV pain control from incisional pain  Consults: None  Significant Diagnostic Studies:   Treatments: surgery: laparoscopic incisional hernia repair with mesh  Discharge Exam: Blood pressure 118/60, pulse 56, temperature 97.6 F (36.4 C), temperature source Oral, resp. rate 18, height 5\' 5"  (1.651 m), weight 108.319 kg (238 lb 12.8 oz), SpO2 96 %. General appearance: alert, cooperative and no distress Resp: clear to auscultation bilaterally Cardio: regular rate and rhythm, S1, S2 normal, no murmur, click, rub or gallop Incision/Wound:abdomen soft, incisions clean  Disposition: 01-Home or Self Care     Medication List    TAKE these medications        acetaminophen 500 MG tablet  Commonly known as:  TYLENOL  Take 500-1,000 mg by mouth at bedtime as needed (pain).     amLODipine 5 MG tablet  Commonly known as:  NORVASC  Take 5 mg by mouth daily.     aspirin EC 81 MG tablet  Take 81 mg by mouth at bedtime.     atenolol 100 MG tablet  Commonly known as:  TENORMIN  Take 100 mg by mouth at bedtime.     gabapentin 300 MG capsule  Commonly known as:  NEURONTIN  Take 900 mg by mouth at bedtime.     hydrochlorothiazide 12.5 MG tablet  Commonly known as:  HYDRODIURIL  Take 12.5 mg by mouth daily.     multivitamin with minerals Tabs tablet  Take 1 tablet by mouth 2 (two) times daily. Centrum Heart     niacin 1000 MG CR tablet  Commonly known as:  NIASPAN  Take 2,000 mg by mouth at bedtime.     omega-3 acid ethyl esters 1 G capsule  Commonly known as:  LOVAZA  Take 2 g by mouth 2 (two) times daily.     omeprazole 20 MG tablet   Commonly known as:  PRILOSEC OTC  Take 20 mg by mouth daily before breakfast.     oxyCODONE 5 MG immediate release tablet  Commonly known as:  Oxy IR/ROXICODONE  Take 1-2 tablets (5-10 mg total) by mouth every 4 (four) hours as needed for moderate pain.     STOOL SOFTENER 100 MG capsule  Generic drug:  docusate sodium  Take 200 mg by mouth daily.     Vitamin D 2000 UNITS tablet  Take 2,000 Units by mouth daily.      ASK your doctor about these medications        oxyCODONE-acetaminophen 5-325 MG per tablet  Commonly known as:  PERCOCET/ROXICET  Take 1-2 tablets by mouth every 4 (four) hours as needed for moderate pain.           Follow-up Information    Follow up with [facility], MD. Schedule an appointment as soon as possible for a visit in 3 weeks.   Specialty:  General Surgery   Contact information:   [place] [address] [phone]       Signed: Harl Bowie 07/04/2015, 8:11 AM

## 2016-03-02 ENCOUNTER — Encounter (HOSPITAL_BASED_OUTPATIENT_CLINIC_OR_DEPARTMENT_OTHER): Payer: Self-pay

## 2016-03-02 ENCOUNTER — Emergency Department (HOSPITAL_BASED_OUTPATIENT_CLINIC_OR_DEPARTMENT_OTHER)
Admission: EM | Admit: 2016-03-02 | Discharge: 2016-03-02 | Disposition: A | Discharge status: Home / Self Care | Payer: Worker's Compensation | Attending: Emergency Medicine | Admitting: Emergency Medicine

## 2016-03-02 DIAGNOSIS — W010XXA Fall on same level from slipping, tripping and stumbling without subsequent striking against object, initial encounter: Secondary | ICD-10-CM | POA: Diagnosis not present

## 2016-03-02 DIAGNOSIS — E785 Hyperlipidemia, unspecified: Secondary | ICD-10-CM | POA: Insufficient documentation

## 2016-03-02 DIAGNOSIS — S5012XA Contusion of left forearm, initial encounter: Secondary | ICD-10-CM | POA: Diagnosis not present

## 2016-03-02 DIAGNOSIS — G9009 Other idiopathic peripheral autonomic neuropathy: Secondary | ICD-10-CM | POA: Diagnosis not present

## 2016-03-02 DIAGNOSIS — N189 Chronic kidney disease, unspecified: Secondary | ICD-10-CM | POA: Diagnosis not present

## 2016-03-02 DIAGNOSIS — Y999 Unspecified external cause status: Secondary | ICD-10-CM | POA: Insufficient documentation

## 2016-03-02 DIAGNOSIS — Y939 Activity, unspecified: Secondary | ICD-10-CM | POA: Diagnosis not present

## 2016-03-02 DIAGNOSIS — Y929 Unspecified place or not applicable: Secondary | ICD-10-CM | POA: Insufficient documentation

## 2016-03-02 DIAGNOSIS — I1310 Hypertensive heart and chronic kidney disease without heart failure, with stage 1 through stage 4 chronic kidney disease, or unspecified chronic kidney disease: Secondary | ICD-10-CM | POA: Insufficient documentation

## 2016-03-02 DIAGNOSIS — T148XXA Other injury of unspecified body region, initial encounter: Secondary | ICD-10-CM

## 2016-03-02 DIAGNOSIS — S59912A Unspecified injury of left forearm, initial encounter: Secondary | ICD-10-CM | POA: Diagnosis present

## 2016-03-02 NOTE — ED Provider Notes (Signed)
CSN: [id number]     Arrival date & time 03/02/16  1919 History  By signing my name below, I, Evelyn Campbell, attest that this documentation has been prepared under the direction and in the presence of Evelyn Dakin, MD . Electronically Signed: Evelene Campbell, Scribe. 03/02/2016. 10:24 PM.     Chief Complaint  Patient presents with  . Fall   The history is provided by the patient. No language interpreter was used.  HPI Comments:  Evelyn Campbell is a 60 y.o. female who presents to the Emergency Department s/p fall 5 days ago complaining of BLE pain, left arm and left hand pain following the incident.  Pt states she tripped and fell landed on outstretched hands and bilateral knees.  She reports associated bruising to her bilateral knees. She notes her pain is gradually improving but notes exacerbation of pain with twisting motion of the LUE.Pain is mild, improved by remaining still no other associated symptoms No alleviating factors noted. She was instructed to come to the ED by her job. Pt reports h/o surgery to the right ankle ~ 2 years ago. She denies use of ETOH and tobacco. Tetanus status is UTD within the last 10 years.      Past Medical History  Diagnosis Date  . Hypertension   . Hyperlipidemia   . Fatty liver   . Vitamin D deficiency   . Peripheral neuropathy ([place])   . GERD (gastroesophageal reflux disease)     occ  . Pneumonia 1980's    hx- not needed hospitalization   . Chronic kidney disease     seen by Nephrology at [facility], due to all her numbers were"off", she  remarks that she was sent to superhydrate & then her labs values corrected.  Pt. will f/u /w nephrology  . Arthritis     R ankle    Past Surgical History  Procedure Laterality Date  . Abdominal hysterectomy  99  . Cholecystectomy  10  . Toe surgery Bilateral 2012    hammer toe  . Ankle fusion Right 6/15  . Pancreatectomy N/A 08/20/2014    Procedure: LAPAROSCOPIC ADRENALECTOMY;  Surgeon: Stark Klein, MD;   Location: [place];  Service: General;  Laterality: N/A;  . Inguinal hernia repair Bilateral 08/20/2014    Procedure: LAPAROSCOPIC BILATERAL INGUINAL HERNIA REPAIR WITH MESH;  Surgeon: Coralie Keens, MD;  Location: [place];  Service: General;  Laterality: Bilateral;  . Umbilical hernia repair N/A 08/20/2014    Procedure: HERNIA REPAIR UMBILICAL ADULT;  Surgeon: Coralie Keens, MD;  Location: [place];  Service: General;  Laterality: N/A;  . Insertion of mesh N/A 08/20/2014    Procedure: INSERTION OF MESH;  Surgeon: Coralie Keens, MD;  Location: [place];  Service: General;  Laterality: N/A;  . Vaginal delivery  1994  . Hernia repair    . Tonsillectomy    . Dental surgery      implant- upper R   . Incisional hernia repair N/A 07/02/2015    Procedure: LAPAROSCOPIC INCISIONAL HERNIA;  Surgeon: Coralie Keens, MD;  Location: [place];  Service: General;  Laterality: N/A;  . Insertion of mesh N/A 07/02/2015    Procedure: INSERTION OF MESH;  Surgeon: Coralie Keens, MD;  Location: [facility] OR;  Service: General;  Laterality: N/A;   Family History  Problem Relation Age of Onset  . Heart failure Father    Social History  Substance Use Topics  . Smoking status: Never Smoker   . Smokeless tobacco: None  . Alcohol Use:  Yes     Comment: wine for special occasion    OB History    No data available     Review of Systems  Constitutional: Negative.   HENT: Negative.   Respiratory: Negative.   Cardiovascular: Negative.   Gastrointestinal: Negative.   Musculoskeletal: Positive for myalgias and arthralgias.  Skin: Negative.  Color change: bruising.  Neurological: Negative.   Psychiatric/Behavioral: Negative.   All other systems reviewed and are negative.   Allergies  Celebrex; Ibuprofen; Meloxicam; Lipitor; Statins; and Amoxicillin  Home Medications   Prior to Admission medications   Medication Sig Start Date End Date Taking? Authorizing Provider  acetaminophen (TYLENOL) 500 MG tablet Take  500-1,000 mg by mouth at bedtime as needed (pain).    Historical Provider, MD  amLODipine (NORVASC) 5 MG tablet Take 5 mg by mouth daily.    Historical Provider, MD  aspirin EC 81 MG tablet Take 81 mg by mouth at bedtime.     Historical Provider, MD  atenolol (TENORMIN) 100 MG tablet Take 100 mg by mouth at bedtime. 06/08/15   Historical Provider, MD  Cholecalciferol (VITAMIN D) 2000 UNITS tablet Take 2,000 Units by mouth daily.    Historical Provider, MD  docusate sodium (STOOL SOFTENER) 100 MG capsule Take 200 mg by mouth daily.     Historical Provider, MD  gabapentin (NEURONTIN) 300 MG capsule Take 900 mg by mouth at bedtime.     Historical Provider, MD  hydrochlorothiazide (HYDRODIURIL) 12.5 MG tablet Take 12.5 mg by mouth daily. 05/23/15   Historical Provider, MD  Multiple Vitamin (MULTIVITAMIN WITH MINERALS) TABS tablet Take 1 tablet by mouth 2 (two) times daily. Centrum Heart    Historical Provider, MD  niacin (NIASPAN) 1000 MG CR tablet Take 2,000 mg by mouth at bedtime.     Historical Provider, MD  omega-3 acid ethyl esters (LOVAZA) 1 G capsule Take 2 g by mouth 2 (two) times daily.     Historical Provider, MD  omeprazole (PRILOSEC OTC) 20 MG tablet Take 20 mg by mouth daily before breakfast.     Historical Provider, MD  oxyCODONE (OXY IR/ROXICODONE) 5 MG immediate release tablet Take 1-2 tablets (5-10 mg total) by mouth every 4 (four) hours as needed for moderate pain. 07/04/15   Coralie Keens, MD   BP 147/98 mmHg  Pulse 99  Temp(Src) 98.1 F (36.7 C) (Oral)  Resp 20  Ht 5\' 5"  (1.651 m)  Wt 238 lb (107.956 kg)  BMI 39.61 kg/m2  SpO2 99% Physical Exam  Constitutional: She is oriented to person, place, and time. She appears well-developed and well-nourished. She appears distressed.  HENT:  Head: Normocephalic and atraumatic.  Eyes: Conjunctivae are normal. Pupils are equal, round, and reactive to light.  Neck: Neck supple. No tracheal deviation present. No thyromegaly present.   Cardiovascular: Normal rate and regular rhythm.   No murmur heard. Pulmonary/Chest: Effort normal and breath sounds normal.  Abdominal: Soft. Bowel sounds are normal. She exhibits no distension. There is no tenderness.  Musculoskeletal: Normal range of motion. She exhibits no edema or tenderness.  Right upper extremity with purplish ecchymosis over the thenar eminence. No tenderness. No soft tissue swelling. Full range of motion neurovascular intact. Left upper extremity with time sized ecchymotic area over palmar aspect of hand. No tenderness no soft tissue swelling for range of motion. Bilateral lower extremities with purplish ecchymoses over proximal calves and shins. No bony tenderness no soft tissue swelling. Pelvis stable nontender. Entire spine nontender.  Neurological: She  is alert and oriented to person, place, and time. Coordination normal.  Gait normal motor strength 5 over 5 overall. Walks without limp  Skin: Skin is warm and dry. No rash noted.  Psychiatric: She has a normal mood and affect.  Nursing note and vitals reviewed.   ED Course  Procedures  DIAGNOSTIC STUDIES:  Oxygen Saturation is 99% on RA, normal by my interpretation.    COORDINATION OF CARE:  10:19 PM Discussed treatment plan with pt at bedside and pt agreed to plan.  Labs Review Labs Reviewed - No data to display  Imaging Review No results found. I have personally reviewed and evaluated these images and lab results as part of my medical decision-making.   EKG Interpretation None     MDM Final diagnoses:  None   x-rays not indicated discuss with patient who agrees. Her pain is steadily improving. She is taking Tylenol for pain and states the pain is minimal. Declines pain medicine presently. Plan follow-up with worker's comp clinic if she does not continue to improve within the next 1-2 weeks.  Diagnoses #1 fall  #2 contusions multiple sites  I personally performed the services described in this  documentation, which was scribed in my presence. The recorded information has been reviewed and considered.      Evelyn Dakin, MD 03/02/16 2237

## 2016-03-02 NOTE — ED Notes (Signed)
MD at bedside. 

## 2016-03-02 NOTE — ED Notes (Signed)
Tripped/fell last Thursday-pain to bilat LE and left hand and arm-NAD-steady gait

## 2016-03-02 NOTE — Discharge Instructions (Signed)
Take Tylenol as directed for pain. Ask your supervisor to see your worker's comp clinic if you continue to have significant pain in 7-10 days. Return if you feel worse or are concerned for any reason.

## 2016-03-07 ENCOUNTER — Emergency Department (HOSPITAL_BASED_OUTPATIENT_CLINIC_OR_DEPARTMENT_OTHER)
Admission: EM | Admit: 2016-03-07 | Discharge: 2016-03-07 | Disposition: A | Discharge status: Home / Self Care | Payer: Worker's Compensation | Attending: Emergency Medicine | Admitting: Emergency Medicine

## 2016-03-07 ENCOUNTER — Encounter (HOSPITAL_BASED_OUTPATIENT_CLINIC_OR_DEPARTMENT_OTHER): Payer: Self-pay | Admitting: *Deleted

## 2016-03-07 ENCOUNTER — Emergency Department (HOSPITAL_BASED_OUTPATIENT_CLINIC_OR_DEPARTMENT_OTHER): Payer: Worker's Compensation

## 2016-03-07 DIAGNOSIS — M19071 Primary osteoarthritis, right ankle and foot: Secondary | ICD-10-CM | POA: Insufficient documentation

## 2016-03-07 DIAGNOSIS — W19XXXA Unspecified fall, initial encounter: Secondary | ICD-10-CM | POA: Insufficient documentation

## 2016-03-07 DIAGNOSIS — Y929 Unspecified place or not applicable: Secondary | ICD-10-CM | POA: Diagnosis not present

## 2016-03-07 DIAGNOSIS — Z79899 Other long term (current) drug therapy: Secondary | ICD-10-CM | POA: Insufficient documentation

## 2016-03-07 DIAGNOSIS — Y999 Unspecified external cause status: Secondary | ICD-10-CM | POA: Insufficient documentation

## 2016-03-07 DIAGNOSIS — N189 Chronic kidney disease, unspecified: Secondary | ICD-10-CM | POA: Diagnosis not present

## 2016-03-07 DIAGNOSIS — I129 Hypertensive chronic kidney disease with stage 1 through stage 4 chronic kidney disease, or unspecified chronic kidney disease: Secondary | ICD-10-CM | POA: Diagnosis not present

## 2016-03-07 DIAGNOSIS — E785 Hyperlipidemia, unspecified: Secondary | ICD-10-CM | POA: Insufficient documentation

## 2016-03-07 DIAGNOSIS — Z7982 Long term (current) use of aspirin: Secondary | ICD-10-CM | POA: Diagnosis not present

## 2016-03-07 DIAGNOSIS — M25532 Pain in left wrist: Secondary | ICD-10-CM | POA: Diagnosis present

## 2016-03-07 DIAGNOSIS — S63502A Unspecified sprain of left wrist, initial encounter: Secondary | ICD-10-CM | POA: Insufficient documentation

## 2016-03-07 DIAGNOSIS — Y939 Activity, unspecified: Secondary | ICD-10-CM | POA: Insufficient documentation

## 2016-03-07 MED ORDER — TRAMADOL HCL 50 MG PO TABS: 50.0000 mg | ORAL_TABLET | Freq: Four times a day (QID) | ORAL | Status: DC | PRN

## 2016-03-07 NOTE — Discharge Instructions (Signed)
Use ice and heat on your wrist.  Follow-up with the orthopedic doctor as needed

## 2016-03-07 NOTE — ED Notes (Signed)
PA at bedside.

## 2016-03-07 NOTE — ED Notes (Signed)
Pt sen here 4/27 for same, cont c/o left arm pain from injury.

## 2016-03-07 NOTE — ED Notes (Signed)
Pt points to left wrist at left radial pulse site

## 2016-03-07 NOTE — ED Notes (Signed)
Wrist splint applied per orders, pt teaching done in regards to application and removal of splint, also use of ice pack to help with pain and comfort

## 2016-03-07 NOTE — ED Notes (Signed)
Mild discoloration noted at base of left thumb near palm of left hand

## 2016-03-07 NOTE — ED Notes (Signed)
Some tenderness to touch noted during exam by PA-C

## 2016-03-07 NOTE — ED Notes (Signed)
Presents with cont c/o left wrist/ left arm pain to left elbow, secondary from a fall approx 2 weeks ago.

## 2016-03-07 NOTE — ED Provider Notes (Signed)
CSN: [id number]     Arrival date & time 03/07/16  1250 History   First MD Initiated Contact with Patient 03/07/16 1438     Chief Complaint  Patient presents with  . Arm Injury     (Consider location/radiation/quality/duration/timing/severity/associated sxs/prior Treatment) HPI Patient presents to the emergency department with continued left wrist pain following a fall.  The patient states she fell 5 days ago injuring her left wrist and both knees.  The patient states that movement and palpation make the wrist pain worse.  She states that her other injuries seem to be healing without any difficulty.  Patient states certain movements and palpation of her wrist make the pain worse.  She states that she notices it will radiate up her forearm from time to time.  Patient states she did not take any medications prior to arrival.  The patient denies numbness, weakness, neck pain, back pain Past Medical History  Diagnosis Date  . Hypertension   . Hyperlipidemia   . Fatty liver   . Vitamin D deficiency   . Peripheral neuropathy ([place])   . GERD (gastroesophageal reflux disease)     occ  . Pneumonia 1980's    hx- not needed hospitalization   . Chronic kidney disease     seen by Nephrology at [facility], due to all her numbers were"off", she  remarks that she was sent to superhydrate & then her labs values corrected.  Pt. will f/u /w nephrology  . Arthritis     R ankle    Past Surgical History  Procedure Laterality Date  . Abdominal hysterectomy  99  . Cholecystectomy  10  . Toe surgery Bilateral 2012    hammer toe  . Ankle fusion Right 6/15  . Pancreatectomy N/A 08/20/2014    Procedure: LAPAROSCOPIC ADRENALECTOMY;  Surgeon: Stark Klein, MD;  Location: [place];  Service: General;  Laterality: N/A;  . Inguinal hernia repair Bilateral 08/20/2014    Procedure: LAPAROSCOPIC BILATERAL INGUINAL HERNIA REPAIR WITH MESH;  Surgeon: Coralie Keens, MD;  Location: [place];  Service: General;  Laterality:  Bilateral;  . Umbilical hernia repair N/A 08/20/2014    Procedure: HERNIA REPAIR UMBILICAL ADULT;  Surgeon: Coralie Keens, MD;  Location: [place];  Service: General;  Laterality: N/A;  . Insertion of mesh N/A 08/20/2014    Procedure: INSERTION OF MESH;  Surgeon: Coralie Keens, MD;  Location: [place];  Service: General;  Laterality: N/A;  . Vaginal delivery  1994  . Hernia repair    . Tonsillectomy    . Dental surgery      implant- upper R   . Incisional hernia repair N/A 07/02/2015    Procedure: LAPAROSCOPIC INCISIONAL HERNIA;  Surgeon: Coralie Keens, MD;  Location: [place];  Service: General;  Laterality: N/A;  . Insertion of mesh N/A 07/02/2015    Procedure: INSERTION OF MESH;  Surgeon: Coralie Keens, MD;  Location: [facility];  Service: General;  Laterality: N/A;   Family History  Problem Relation Age of Onset  . Heart failure Father    Social History  Substance Use Topics  . Smoking status: Never Smoker   . Smokeless tobacco: None  . Alcohol Use: Yes     Comment: wine for special occasion    OB History    No data available     Review of Systems  All other systems negative except as documented in the HPI. All pertinent positives and negatives as reviewed in the HPI.  Allergies  Celebrex; Ibuprofen; Meloxicam;  Lipitor; Statins; and Amoxicillin  Home Medications   Prior to Admission medications   Medication Sig Start Date End Date Taking? Authorizing Provider  acetaminophen (TYLENOL) 500 MG tablet Take 500-1,000 mg by mouth at bedtime as needed (pain).    Historical Provider, MD  amLODipine (NORVASC) 5 MG tablet Take 5 mg by mouth daily.    Historical Provider, MD  aspirin EC 81 MG tablet Take 81 mg by mouth at bedtime.     Historical Provider, MD  atenolol (TENORMIN) 100 MG tablet Take 100 mg by mouth at bedtime. 06/08/15   Historical Provider, MD  Cholecalciferol (VITAMIN D) 2000 UNITS tablet Take 2,000 Units by mouth daily.    Historical Provider, MD  docusate sodium  (STOOL SOFTENER) 100 MG capsule Take 200 mg by mouth daily.     Historical Provider, MD  gabapentin (NEURONTIN) 300 MG capsule Take 900 mg by mouth at bedtime.     Historical Provider, MD  hydrochlorothiazide (HYDRODIURIL) 12.5 MG tablet Take 12.5 mg by mouth daily. 05/23/15   Historical Provider, MD  Multiple Vitamin (MULTIVITAMIN WITH MINERALS) TABS tablet Take 1 tablet by mouth 2 (two) times daily. Centrum Heart    Historical Provider, MD  niacin (NIASPAN) 1000 MG CR tablet Take 2,000 mg by mouth at bedtime.     Historical Provider, MD  omega-3 acid ethyl esters (LOVAZA) 1 G capsule Take 2 g by mouth 2 (two) times daily.     Historical Provider, MD  omeprazole (PRILOSEC OTC) 20 MG tablet Take 20 mg by mouth daily before breakfast.     Historical Provider, MD  oxyCODONE (OXY IR/ROXICODONE) 5 MG immediate release tablet Take 1-2 tablets (5-10 mg total) by mouth every 4 (four) hours as needed for moderate pain. 07/04/15   Coralie Keens, MD   BP 131/84 mmHg  Pulse 62  Temp(Src) 97.9 F (36.6 C) (Oral)  Resp 20  Ht 5' 5.5" (1.664 m)  Wt 106.595 kg  BMI 38.50 kg/m2  SpO2 98% Physical Exam  Constitutional: She is oriented to person, place, and time. She appears well-developed and well-nourished. No distress.  HENT:  Head: Normocephalic and atraumatic.  Eyes: Pupils are equal, round, and reactive to light.  Neck: Normal range of motion. Neck supple.  Pulmonary/Chest: Effort normal.  Musculoskeletal:       Left wrist: She exhibits tenderness. She exhibits normal range of motion, no bony tenderness, no swelling, no effusion, no crepitus and no deformity.  Neurological: She is alert and oriented to person, place, and time. She has normal reflexes. She exhibits normal muscle tone. Coordination normal.  Skin: Skin is warm and dry. No rash noted. No erythema.  Psychiatric: She has a normal mood and affect. Her behavior is normal.  Nursing note and vitals reviewed.   ED Course  Procedures  (including critical care time) Labs Review Labs Reviewed - No data to display  Imaging Review Dg Wrist Complete Left  03/07/2016  CLINICAL DATA:  [place] 10 days ago.  Persistent left wrist pain. EXAM: LEFT WRIST - COMPLETE 3+ VIEW COMPARISON:  None. FINDINGS: The joint spaces are maintained. No fracture is identified. No significant degenerative changes. IMPRESSION: No acute fracture. Electronically Signed   By: Marijo Sanes M.D.   On: 03/07/2016 15:43   I have personally reviewed and evaluated these images and lab results as part of my medical decision-making.  Patient will be treated for sprain of the wrist.  Advised to follow-up with the orthopedic hand surgeon.  Told to  return here as needed.  Placed in a [place], PA-C 03/07/16 [place], MD 03/07/16 [id number]

## 2016-10-05 DIAGNOSIS — M19071 Primary osteoarthritis, right ankle and foot: Secondary | ICD-10-CM | POA: Insufficient documentation

## 2017-06-09 NOTE — Progress Notes (Signed)
Cardiology Office Note   Date:  06/10/2017   ID:  Evelyn, Campbell [birth year], MRN [id number]  PCP:  Cari Caraway, MD  Cardiologist:  Dr. Marlou Porch    Chief Complaint  Patient presents with  . Hypertension    seen for Dr. Marlou Porch      History of Present Illness: Evelyn Campbell is a 60 y.o. female who presents for HTN and premature family hx CAD.  Last seen by Dr. Marlou Porch in 2015.  Her hx includes hyperlipidemia, hypertriglyceridemia, hypertension. Early family history of coronary artery disease- father died at age 54 from MI.  She has been intolerant to statins.    Today she tells me she is doing well.  She has had several surgeries on abd with hernia repairs.  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   She has been taking care of her family and she has begun exercising.  She rides stationary bike and is up to 47 min.  She is trying to decrease carbs in dieting as well.   She has no chest pain and no SOB.  Her last lipids LDL is 111, T chol 197, and HDL is 51.  HGBA1C is 5.6, hgb is 13.3 TSH is 3.370.  She continues Lovaza and niaspan for lipids with  Her intolerance to statins.   Past Medical History:  Diagnosis Date  . Arthritis    R ankle   . Chronic kidney disease    seen by Nephrology at [facility], due to all her numbers were"off", she  remarks that she was sent to superhydrate & then her labs values corrected.  Pt. will f/u /w nephrology  . Fatty liver   . GERD (gastroesophageal reflux disease)    occ  . Hyperlipidemia   . Hypertension   . Peripheral neuropathy   . Pneumonia 1980's   hx- not needed hospitalization   . Vitamin D deficiency     Past Surgical History:  Procedure Laterality Date  . ABDOMINAL HYSTERECTOMY  99  . ANKLE FUSION Right 6/15  . CHOLECYSTECTOMY  10  . DENTAL SURGERY     implant- upper R   . HERNIA REPAIR    . INCISIONAL HERNIA REPAIR N/A 07/02/2015    Procedure: LAPAROSCOPIC INCISIONAL HERNIA;  Surgeon: Coralie Keens, MD;  Location: [place];  Service: General;  Laterality: N/A;  . INGUINAL HERNIA REPAIR Bilateral 08/20/2014   Procedure: LAPAROSCOPIC BILATERAL INGUINAL HERNIA REPAIR WITH MESH;  Surgeon: Coralie Keens, MD;  Location: [place];  Service: General;  Laterality: Bilateral;  . INSERTION OF MESH N/A 08/20/2014   Procedure: INSERTION OF MESH;  Surgeon: Coralie Keens, MD;  Location: [place];  Service: General;  Laterality: N/A;  . INSERTION OF MESH N/A 07/02/2015   Procedure: INSERTION OF MESH;  Surgeon: Coralie Keens, MD;  Location: [place];  Service: General;  Laterality: N/A;  . PANCREATECTOMY N/A 08/20/2014   Procedure: LAPAROSCOPIC ADRENALECTOMY;  Surgeon: Stark Klein, MD;  Location: [place];  Service: General;  Laterality: N/A;  . TOE SURGERY Bilateral 2012   hammer toe  . TONSILLECTOMY    . UMBILICAL HERNIA REPAIR N/A 08/20/2014   Procedure: HERNIA REPAIR UMBILICAL ADULT;  Surgeon: Coralie Keens, MD;  Location: [place];  Service: General;  Laterality: N/A;  . VAGINAL DELIVERY  1994     Current Outpatient Prescriptions  Medication Sig Dispense Refill  . acetaminophen (TYLENOL) 500 MG tablet  Take 500-1,000 mg by mouth at bedtime as needed (pain).    Marland Kitchen amLODipine (NORVASC) 5 MG tablet Take 5 mg by mouth daily.    Marland Kitchen aspirin EC 81 MG tablet Take 81 mg by mouth at bedtime.     Marland Kitchen atenolol (TENORMIN) 100 MG tablet Take 100 mg by mouth at bedtime.  4  . Cholecalciferol (VITAMIN D) 2000 UNITS tablet Take 2,000 Units by mouth daily.    Marland Kitchen docusate sodium (STOOL SOFTENER) 100 MG capsule Take 200 mg by mouth daily.     Marland Kitchen gabapentin (NEURONTIN) 300 MG capsule Take 900 mg by mouth at bedtime.     . hydrochlorothiazide (HYDRODIURIL) 12.5 MG tablet Take 12.5 mg by mouth daily.  5  . Multiple Vitamin (MULTIVITAMIN WITH MINERALS) TABS tablet Take 1 tablet by mouth 2 (two) times daily. Centrum Heart    . niacin (NIASPAN) 1000 MG CR  tablet Take 2,000 mg by mouth at bedtime.     Marland Kitchen omega-3 acid ethyl esters (LOVAZA) 1 G capsule Take 2 g by mouth 2 (two) times daily.     Marland Kitchen omeprazole (PRILOSEC OTC) 20 MG tablet Take 20 mg by mouth daily before breakfast.      No current facility-administered medications for this visit.     Allergies:   Celebrex [celecoxib]; Ibuprofen; Meloxicam; Lipitor [atorvastatin]; Statins; and Amoxicillin    Social History:  The patient  reports that she has never smoked. She has never used smokeless tobacco. She reports that she drinks alcohol. She reports that she does not use drugs.   Family History:  The patient's family history includes Heart failure in her father.    ROS:  General:no colds or fevers, + weight increase Skin:no rashes or ulcers HEENT:no blurred vision, no congestion CV:see HPI PUL:see HPI GI:no diarrhea constipation or melena, no indigestion GU:no hematuria, no dysuria MS:+ rt ankle pain with too much exertion, no claudication Neuro:no syncope, no lightheadedness Endo:no diabetes, no thyroid disease  Wt Readings from Last 3 Encounters:  06/10/17 248 lb 9.6 oz (112.8 kg)  03/07/16 235 lb (106.6 kg)  03/02/16 238 lb (108 kg)     PHYSICAL EXAM: VS:  BP 120/86   Pulse (!) 54   Ht 5' 5.5" (1.664 m)   Wt 248 lb 9.6 oz (112.8 kg)   BMI 40.74 kg/m  , BMI Body mass index is 40.74 kg/m. General:Pleasant affect, NAD Skin:Warm and dry, brisk capillary refill HEENT:normocephalic, sclera clear, mucus membranes moist Neck:supple, no JVD, no bruits  Heart:S1S2 RRR without murmur, gallup, rub or click Lungs:clear without rales, rhonchi, or wheezes UUV:OZDG, non tender, + BS, do not palpate liver spleen or masses Ext:tr lower ext edema, 2+ pedal pulses, 2+ radial pulses Neuro:alert and oriented X 3, MAE, follows commands, + facial symmetry    EKG:  EKG is ordered today. The ekg ordered today demonstrates Sinus brady otherwise normal EKG.  No changes from 2016.      Recent Labs: No results found for requested labs within last 8760 hours.    Lipid Panel No results found for: CHOL, TRIG, HDL, CHOLHDL, VLDL, LDLCALC, LDLDIRECT     Other studies Reviewed: Additional studies/ records that were reviewed today include:labs as noted.   ASSESSMENT AND PLAN:  1.  HTN controlled followed by PCP  2.  HLD, stable with niaspan and Lovaza continue, followed by PCP  3. Obesity she has started an exercise plan and doing well and understands diet needs to be added.  4.  Premature FH CAD.  She has no chest pain and no SOB. Follow up with Dr. Marlou Porch PRN, or 2-3 years.    Current medicines are reviewed with the patient today.  The patient Has no concerns regarding medicines.  The following changes have been made:  See above Labs/ tests ordered today include:see above  Disposition:   FU:  see above  Signed, Cecilie Kicks, NP  06/10/2017 11:20 AM    [address], [place], [place], [place] Phone: [phone]; Fax: [phone]

## 2017-06-10 ENCOUNTER — Ambulatory Visit (INDEPENDENT_AMBULATORY_CARE_PROVIDER_SITE_OTHER): Payer: BC Managed Care – PPO | Admitting: Cardiology

## 2017-06-10 ENCOUNTER — Encounter (INDEPENDENT_AMBULATORY_CARE_PROVIDER_SITE_OTHER): Payer: Self-pay

## 2017-06-10 ENCOUNTER — Encounter: Payer: Self-pay | Admitting: Cardiology

## 2017-06-10 VITALS — BP 120/86 | HR 54 | Ht 65.5 in | Wt 248.6 lb

## 2017-06-10 DIAGNOSIS — E782 Mixed hyperlipidemia: Secondary | ICD-10-CM | POA: Diagnosis not present

## 2017-06-10 DIAGNOSIS — Z8249 Family history of ischemic heart disease and other diseases of the circulatory system: Secondary | ICD-10-CM

## 2017-06-10 DIAGNOSIS — Z6841 Body Mass Index (BMI) 40.0 and over, adult: Secondary | ICD-10-CM

## 2017-06-10 DIAGNOSIS — I10 Essential (primary) hypertension: Secondary | ICD-10-CM

## 2017-06-10 NOTE — Patient Instructions (Signed)
Medication Instructions:  Your physician recommends that you continue on your current medications as directed. Please refer to the Current Medication list given to you today.   Labwork: None Ordered   Testing/Procedures: None Ordered   Follow-Up: Your physician recommends that you schedule a follow-up appointment in: as needed.   Any Other Special Instructions Will Be Listed Below (If Applicable).     If you need a refill on your cardiac medications before your next appointment, please call your pharmacy.

## 2018-01-24 ENCOUNTER — Other Ambulatory Visit: Payer: Self-pay | Admitting: Obstetrics and Gynecology

## 2018-01-24 DIAGNOSIS — R928 Other abnormal and inconclusive findings on diagnostic imaging of breast: Secondary | ICD-10-CM

## 2018-01-27 ENCOUNTER — Ambulatory Visit
Admission: RE | Admit: 2018-01-27 | Discharge: 2018-01-27 | Disposition: A | Discharge status: Home / Self Care | Payer: BC Managed Care – PPO | Source: Ambulatory Visit | Attending: Obstetrics and Gynecology | Admitting: Obstetrics and Gynecology

## 2018-01-27 DIAGNOSIS — R928 Other abnormal and inconclusive findings on diagnostic imaging of breast: Secondary | ICD-10-CM

## 2019-05-01 DIAGNOSIS — R2241 Localized swelling, mass and lump, right lower limb: Secondary | ICD-10-CM | POA: Insufficient documentation

## 2019-05-03 DIAGNOSIS — M6780 Other specified disorders of synovium and tendon, unspecified site: Secondary | ICD-10-CM | POA: Insufficient documentation

## 2019-05-18 DIAGNOSIS — Z1159 Encounter for screening for other viral diseases: Secondary | ICD-10-CM | POA: Insufficient documentation

## 2019-05-23 NOTE — Progress Notes (Signed)
Cardiology Office Note   Date:  05/24/2019   ID:  Harjot, Zavadil 1956-06-22, MRN [id number]  PCP:  Cari Caraway, MD  Cardiologist:  Dr. Marlou Porch    Chief Complaint  Patient presents with  . Hypertension  . Loss of Consciousness  . Pre-op Exam      History of Present Illness: Evelyn Campbell is a 63 y.o. female who presents for HTN and more recently syncope and need for Lt foot cyst removal tomorrow - last seen 2018  HTN and premature family hx CAD.  Last seen by Dr. Marlou Porch in 2015.  Her hx includes hyperlipidemia, hypertriglyceridemia, hypertension. Early family history of coronary artery disease- father died at age 18 from MI.  She has been intolerant to statins.    On last visit in 2018 she was doing well.  She has had several surgeries on abd with hernia repairs.  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   She has been taking care of her family.  .    Her last lipids LDL is 111, T chol 197, and HDL is 51.  HGBA1C is 5.6, hgb is 13.3 TSH is 3.370.  She continues Lovaza and niaspan for lipids with  Her intolerance to statins.   Today she has been doing well until about 2 weeks ago, she was "zoned out" on computer doing work and was at computer for about 12 hours then slept then back to computer for 12 hours with meal only before going on computer.  She stopped around 1 in AM and then took warm shower and passed out.  Out for seconds and her husband had her almost up and she went out again.  No seizures, out very briefly, EMS called, EKG normal, not orthostatic and glucose was stable.   She denies any SOB or chest pain.  She had walked all over [facility] for appt recently without chest pain.  she has felt well since syncope.   She is for Lt foot cyst removal tomorrow, they will use lower ext block.  She denies arrhythmias.    Past Medical History:  Diagnosis Date  . Arthritis    R ankle   . Chronic kidney disease    seen by Nephrology at [facility], due to all her numbers were"off", she  remarks that she was sent to superhydrate & then her labs values corrected.  Pt. will f/u /w nephrology  . Fatty liver   . GERD (gastroesophageal reflux disease)    occ  . Hyperlipidemia   . Hypertension   . Peripheral neuropathy   . Pneumonia 1980's   hx- not needed hospitalization   . Vitamin D deficiency     Past Surgical History:  Procedure Laterality Date  . ABDOMINAL HYSTERECTOMY  99  . ANKLE FUSION Right 6/15  . CHOLECYSTECTOMY  10  . DENTAL SURGERY     implant- upper R   . HERNIA REPAIR    . INCISIONAL HERNIA REPAIR N/A 07/02/2015   Procedure: LAPAROSCOPIC INCISIONAL HERNIA;  Surgeon: Coralie Keens, MD;  Location: [place];  Service: General;  Laterality: N/A;  . INGUINAL HERNIA REPAIR Bilateral 08/20/2014   Procedure: LAPAROSCOPIC BILATERAL INGUINAL HERNIA REPAIR WITH MESH;  Surgeon: Coralie Keens, MD;  Location: [place];  Service: General;  Laterality: Bilateral;  . INSERTION OF MESH N/A 08/20/2014   Procedure: INSERTION OF MESH;  Surgeon: Coralie Keens, MD;  Location:  [place] [facility];  Service: General;  Laterality: N/A;  . INSERTION OF MESH N/A 07/02/2015   Procedure: INSERTION OF MESH;  Surgeon: Coralie Keens, MD;  Location: [place];  Service: General;  Laterality: N/A;  . PANCREATECTOMY N/A 08/20/2014   Procedure: LAPAROSCOPIC ADRENALECTOMY;  Surgeon: Stark Klein, MD;  Location: [place];  Service: General;  Laterality: N/A;  . TOE SURGERY Bilateral 2012   hammer toe  . TONSILLECTOMY    . UMBILICAL HERNIA REPAIR N/A 08/20/2014   Procedure: HERNIA REPAIR UMBILICAL ADULT;  Surgeon: Coralie Keens, MD;  Location: [place];  Service: General;  Laterality: N/A;  . VAGINAL DELIVERY  1994     Current Outpatient Medications  Medication Sig Dispense Refill  . acetaminophen (TYLENOL) 500 MG tablet Take 500-1,000 mg by mouth at bedtime as needed (pain).    Marland Kitchen amLODipine  (NORVASC) 5 MG tablet Take 5 mg by mouth daily.    Marland Kitchen aspirin EC 81 MG tablet Take 81 mg by mouth at bedtime.     Marland Kitchen atenolol (TENORMIN) 100 MG tablet Take 100 mg by mouth at bedtime.  4  . Cholecalciferol (VITAMIN D) 2000 UNITS tablet Take 2,000 Units by mouth daily.    Marland Kitchen docusate sodium (STOOL SOFTENER) 100 MG capsule Take 200 mg by mouth daily.     Marland Kitchen gabapentin (NEURONTIN) 300 MG capsule Take 900 mg by mouth at bedtime.     . hydrochlorothiazide (HYDRODIURIL) 12.5 MG tablet Take 12.5 mg by mouth daily.  5  . Multiple Vitamin (MULTIVITAMIN WITH MINERALS) TABS tablet Take 1 tablet by mouth 2 (two) times daily. Centrum Heart    . niacin (NIASPAN) 1000 MG CR tablet Take 2,000 mg by mouth at bedtime.     Marland Kitchen omega-3 acid ethyl esters (LOVAZA) 1 G capsule Take 2 g by mouth 2 (two) times daily.     Marland Kitchen omeprazole (PRILOSEC OTC) 20 MG tablet Take 20 mg by mouth daily before breakfast.      No current facility-administered medications for this visit.     Allergies:   Celebrex [celecoxib], Ibuprofen, Meloxicam, Lipitor [atorvastatin], Statins, and Amoxicillin    Social History:  The patient  reports that she has never smoked. She has never used smokeless tobacco. She reports current alcohol use. She reports that she does not use drugs.   Family History:  The patient's family history includes Heart failure in her father.    ROS:  General:no colds or fevers, no weight changes Skin:no rashes or ulcers HEENT:no blurred vision, no congestion CV:see HPI PUL:see HPI GI:no diarrhea constipation or melena, no indigestion GU:no hematuria, no dysuria MS:no joint pain, no claudication, Lt foot pain   Neuro:+ syncope, no lightheadedness Endo:no diabetes, no thyroid disease  Wt Readings from Last 3 Encounters:  05/24/19 245 lb 12.8 oz (111.5 kg)  06/10/17 248 lb 9.6 oz (112.8 kg)  03/07/16 235 lb (106.6 kg)     PHYSICAL EXAM: VS:  BP 118/74   Pulse (!) 59   Ht 5' 5.5" (1.664 m)   Wt 245 lb 12.8 oz  (111.5 kg)   SpO2 99%   BMI 40.28 kg/m  , BMI Body mass index is 40.28 kg/m. General:Pleasant affect, NAD Skin:Warm and dry, brisk capillary refill HEENT:normocephalic, sclera clear, mucus membranes moist Neck:supple, no JVD, no bruits  Heart:S1S2 RRR with soft sytolic murmur, no gallup, rub or click Lungs:clear without rales, rhonchi, or wheezes HQR:FXJO, non tender, + BS, do not palpate liver spleen or masses Ext:no lower ext edema,  2+ pedal pulses, 2+ radial pulses Neuro:alert and oriented X 3, MAE, follows commands, + facial symmetry    EKG:  EKG is ordered today. The ekg ordered today demonstrates SB at 59 normal EKG and no changes from 2018.     Recent Labs: No results found for requested labs within last 8760 hours.    Lipid Panel No results found for: CHOL, TRIG, HDL, CHOLHDL, VLDL, LDLCALC, LDLDIRECT     Other studies Reviewed: Additional studies/ records that were reviewed today include: prior office visits..   ASSESSMENT AND PLAN:  1.  Pre-op eval.  Pt with no known CAD and no angina and has been meeting 4 METS with activity.  No chest pain.  She has had syncope see below.  Pt is at low risk for low risk procedure.    2.  Syncope with normal EKG and no further episodes most likely vasovagal with no food or drink and standing for shower.  If further episodes would do 30 day event monitor.   3.  Soft murmur will check echo, this should not postpone surgery.   4. HTN controlled today continue current meds.     5.   HLD followed by PCP  6.  Premature FH CAD stable  follow up with Dr. Marlou Porch in 1 year.      Primary Cardiologist: Candee Furbish, MD  Chart reviewed as part of pre-operative protocol coverage. Given past medical history and time since last visit, based on ACC/AHA guidelines, Evelyn Campbell would be at acceptable risk for the planned procedure without further cardiovascular testing.   I will route this recommendation to the requesting party via  Epic fax function and remove from pre-op pool.  Please call with questions.  Cecilie Kicks, NP 05/24/2019, 1:41 PM      Current medicines are reviewed with the patient today.  The patient Has no concerns regarding medicines.  The following changes have been made:  See above Labs/ tests ordered today include:see above  Disposition:   FU:  see above  Signed, Cecilie Kicks, NP  05/24/2019 1:41 PM    [facility] [place], [place], [address] [place], [place] Phone: [phone]; Fax: [phone]

## 2019-05-24 ENCOUNTER — Encounter: Payer: Self-pay | Admitting: Cardiology

## 2019-05-24 ENCOUNTER — Other Ambulatory Visit: Payer: Self-pay

## 2019-05-24 ENCOUNTER — Ambulatory Visit (INDEPENDENT_AMBULATORY_CARE_PROVIDER_SITE_OTHER): Payer: Medicare Other | Admitting: Cardiology

## 2019-05-24 VITALS — BP 118/74 | HR 59 | Ht 65.5 in | Wt 245.8 lb

## 2019-05-24 DIAGNOSIS — E782 Mixed hyperlipidemia: Secondary | ICD-10-CM

## 2019-05-24 DIAGNOSIS — Z01818 Encounter for other preprocedural examination: Secondary | ICD-10-CM

## 2019-05-24 DIAGNOSIS — R011 Cardiac murmur, unspecified: Secondary | ICD-10-CM | POA: Diagnosis not present

## 2019-05-24 DIAGNOSIS — I10 Essential (primary) hypertension: Secondary | ICD-10-CM

## 2019-05-24 DIAGNOSIS — R55 Syncope and collapse: Secondary | ICD-10-CM

## 2019-05-24 DIAGNOSIS — Z8249 Family history of ischemic heart disease and other diseases of the circulatory system: Secondary | ICD-10-CM

## 2019-05-24 NOTE — Patient Instructions (Signed)
Medication Instructions:  Your physician recommends that you continue on your current medications as directed. Please refer to the Current Medication list given to you today.  If you need a refill on your cardiac medications before your next appointment, please call your pharmacy.   Lab work: NONE If you have labs (blood work) drawn today and your tests are completely normal, you will receive your results only by: Marland Kitchen MyChart Message (if you have MyChart) OR . A paper copy in the mail If you have any lab test that is abnormal or we need to change your treatment, we will call you to review the results.  Testing/Procedures: Your physician has requested that you have an echocardiogram. Echocardiography is a painless test that uses sound waves to create images of your heart. It provides your doctor with information about the size and shape of your heart and how well your heart's chambers and valves are working. This procedure takes approximately one hour. There are no restrictions for this procedure.   Follow-Up: At [facility], you and your health needs are our priority.  As part of our continuing mission to provide you with exceptional heart care, we have created designated Provider Care Teams.  These Care Teams include your primary Cardiologist (physician) and Advanced Practice Providers (APPs -  Physician Assistants and Nurse Practitioners) who all work together to provide you with the care you need, when you need it. You will need a follow up appointment in [place].  Please call our office 2 months in advance to schedule this appointment.   Any Other Special Instructions Will Be Listed Below (If Applicable).    Echocardiogram An echocardiogram is a procedure that uses painless sound waves (ultrasound) to produce an image of the heart. Images from an echocardiogram can provide important information about:  Signs of coronary artery disease (CAD).  Aneurysm detection. An  aneurysm is a weak or damaged part of an artery wall that bulges out from the normal force of blood pumping through the body.  Heart size and shape. Changes in the size or shape of the heart can be associated with certain conditions, including heart failure, aneurysm, and CAD.  Heart muscle function.  Heart valve function.  Signs of a past heart attack.  Fluid buildup around the heart.  Thickening of the heart muscle.  A tumor or infectious growth around the heart valves. Tell a health care provider about:  Any allergies you have.  All medicines you are taking, including vitamins, herbs, eye drops, creams, and over-the-counter medicines.  Any blood disorders you have.  Any surgeries you have had.  Any medical conditions you have.  Whether you are pregnant or may be pregnant. What are the risks? Generally, this is a safe procedure. However, problems may occur, including:  Allergic reaction to dye (contrast) that may be used during the procedure. What happens before the procedure? No specific preparation is needed. You may eat and drink normally. What happens during the procedure?   An IV tube may be inserted into one of your veins.  You may receive contrast through this tube. A contrast is an injection that improves the quality of the pictures from your heart.  A gel will be applied to your chest.  A wand-like tool (transducer) will be moved over your chest. The gel will help to transmit the sound waves from the transducer.  The sound waves will harmlessly bounce off of your heart to allow the heart images to be  captured in real-time motion. The images will be recorded on a computer. The procedure may vary among health care providers and hospitals. What happens after the procedure?  You may return to your normal, everyday life, including diet, activities, and medicines, unless your health care provider tells you not to do that. Summary  An echocardiogram is a  procedure that uses painless sound waves (ultrasound) to produce an image of the heart.  Images from an echocardiogram can provide important information about the size and shape of your heart, heart muscle function, heart valve function, and fluid buildup around your heart.  You do not need to do anything to prepare before this procedure. You may eat and drink normally.  After the echocardiogram is completed, you may return to your normal, everyday life, unless your health care provider tells you not to do that. This information is not intended to replace advice given to you by your health care provider. Make sure you discuss any questions you have with your health care provider. Document Released: 10/15/2000 Document Revised: 02/08/2019 Document Reviewed: 11/20/2016 Elsevier Patient Education  2020 [employer].

## 2019-08-27 ENCOUNTER — Ambulatory Visit (HOSPITAL_COMMUNITY): Payer: Medicare Other | Attending: Cardiology

## 2019-08-27 ENCOUNTER — Other Ambulatory Visit: Payer: Self-pay

## 2019-08-27 DIAGNOSIS — R011 Cardiac murmur, unspecified: Secondary | ICD-10-CM | POA: Diagnosis present

## 2019-11-05 ENCOUNTER — Ambulatory Visit: Payer: Medicare Other | Admitting: Cardiology

## 2020-01-24 ENCOUNTER — Ambulatory Visit: Payer: Medicare Other | Admitting: Cardiology

## 2020-02-22 ENCOUNTER — Encounter (INDEPENDENT_AMBULATORY_CARE_PROVIDER_SITE_OTHER): Payer: Self-pay

## 2020-02-22 ENCOUNTER — Encounter: Payer: Self-pay | Admitting: Cardiology

## 2020-02-22 ENCOUNTER — Other Ambulatory Visit: Payer: Self-pay

## 2020-02-22 ENCOUNTER — Ambulatory Visit (INDEPENDENT_AMBULATORY_CARE_PROVIDER_SITE_OTHER): Payer: Medicare Other | Admitting: Cardiology

## 2020-02-22 VITALS — BP 120/88 | HR 67 | Ht 65.0 in | Wt 255.8 lb

## 2020-02-22 DIAGNOSIS — I10 Essential (primary) hypertension: Secondary | ICD-10-CM

## 2020-02-22 DIAGNOSIS — E782 Mixed hyperlipidemia: Secondary | ICD-10-CM | POA: Diagnosis not present

## 2020-02-22 DIAGNOSIS — Z8249 Family history of ischemic heart disease and other diseases of the circulatory system: Secondary | ICD-10-CM | POA: Diagnosis not present

## 2020-02-22 NOTE — Progress Notes (Signed)
Cardiology Office Note:    Date:  02/22/2020   ID:  Mendie, Mcbeth June 30, 1956, MRN [id number]  PCP:  Cari Caraway, MD  Cardiologist:  Candee Furbish, MD  Electrophysiologist:  None   Referring MD: Cari Caraway, MD     History of Present Illness:    Evelyn Campbell is a 64 y.o. female here for the follow-up of premature family history of CAD hypertension hypertriglyceridemia hyperlipidemia.  Father died age 58 from MI.  Statin intolerant.  Taking Lovaza and Niaspan  Prior syncope with normal EKG likely vasovagal.  Exercising, stationary bike 5-7 miles. Daily.   Have fallen a few times. Saw husband Richardson Landry as well.   Taught art.    Past Medical History:  Diagnosis Date  . Arthritis    R ankle   . Chronic kidney disease    seen by Nephrology at [facility], due to all her numbers were"off", she  remarks that she was sent to superhydrate & then her labs values corrected.  Pt. will f/u /w nephrology  . Fatty liver   . GERD (gastroesophageal reflux disease)    occ  . Hyperlipidemia   . Hypertension   . Peripheral neuropathy   . Pneumonia 1980's   hx- not needed hospitalization   . Vitamin D deficiency     Past Surgical History:  Procedure Laterality Date  . ABDOMINAL HYSTERECTOMY  99  . ANKLE FUSION Right 6/15  . CHOLECYSTECTOMY  10  . DENTAL SURGERY     implant- upper R   . HERNIA REPAIR    . INCISIONAL HERNIA REPAIR N/A 07/02/2015   Procedure: LAPAROSCOPIC INCISIONAL HERNIA;  Surgeon: Coralie Keens, MD;  Location: [place];  Service: General;  Laterality: N/A;  . INGUINAL HERNIA REPAIR Bilateral 08/20/2014   Procedure: LAPAROSCOPIC BILATERAL INGUINAL HERNIA REPAIR WITH MESH;  Surgeon: Coralie Keens, MD;  Location: [place];  Service: General;  Laterality: Bilateral;  . INSERTION OF MESH N/A 08/20/2014   Procedure: INSERTION OF MESH;  Surgeon: Coralie Keens, MD;  Location: [place];  Service: General;  Laterality: N/A;  . INSERTION OF MESH N/A 07/02/2015    Procedure: INSERTION OF MESH;  Surgeon: Coralie Keens, MD;  Location: [place];  Service: General;  Laterality: N/A;  . PANCREATECTOMY N/A 08/20/2014   Procedure: LAPAROSCOPIC ADRENALECTOMY;  Surgeon: Stark Klein, MD;  Location: [place];  Service: General;  Laterality: N/A;  . TOE SURGERY Bilateral 2012   hammer toe  . TONSILLECTOMY    . UMBILICAL HERNIA REPAIR N/A 08/20/2014   Procedure: HERNIA REPAIR UMBILICAL ADULT;  Surgeon: Coralie Keens, MD;  Location: [place];  Service: General;  Laterality: N/A;  . VAGINAL DELIVERY  1994    Current Medications: Current Meds  Medication Sig  . acetaminophen (TYLENOL) 500 MG tablet Take 500-1,000 mg by mouth at bedtime as needed (pain).  Marland Kitchen amLODipine (NORVASC) 5 MG tablet Take 5 mg by mouth daily.  Marland Kitchen aspirin EC 81 MG tablet Take 81 mg by mouth at bedtime.   Marland Kitchen atenolol (TENORMIN) 100 MG tablet Take 100 mg by mouth at bedtime.  . Cholecalciferol (VITAMIN D) 2000 UNITS tablet Take 2,000 Units by mouth daily.  Marland Kitchen docusate sodium (STOOL SOFTENER) 100 MG capsule Take 200 mg by mouth daily.   Marland Kitchen gabapentin (NEURONTIN) 300 MG capsule Take 1,200 mg by mouth at bedtime.   . hydrochlorothiazide (HYDRODIURIL) 12.5 MG tablet Take 12.5 mg by mouth daily.  . Multiple Vitamin (MULTIVITAMIN WITH MINERALS) TABS tablet Take 1 tablet by mouth  2 (two) times daily. Centrum Heart  . niacin (NIASPAN) 1000 MG CR tablet Take 2,000 mg by mouth at bedtime.   Marland Kitchen omega-3 acid ethyl esters (LOVAZA) 1 G capsule Take 2 g by mouth 2 (two) times daily.   Marland Kitchen omeprazole (PRILOSEC OTC) 20 MG tablet Take 20 mg by mouth daily before breakfast.      Allergies:   Celebrex [celecoxib], Ibuprofen, Meloxicam, Lipitor [atorvastatin], Statins, and Amoxicillin   Social History   Socioeconomic History  . Marital status: Married    Spouse name: Not on file  . Number of children: Not on file  . Years of education: Not on file  . Highest education level: Not on file  Occupational History  .  Not on file  Tobacco Use  . Smoking status: Never Smoker  . Smokeless tobacco: Never Used  Substance and Sexual Activity  . Alcohol use: Yes    Comment: wine for special occasion   . Drug use: No  . Sexual activity: Not on file  Other Topics Concern  . Not on file  Social History Narrative  . Not on file   Social Determinants of Health   Financial Resource Strain:   . Difficulty of Paying Living Expenses:   Food Insecurity:   . Worried About Charity fundraiser in the Last Year:   . Arboriculturist in the Last Year:   Transportation Needs:   . Film/video editor (Medical):   Marland Kitchen Lack of Transportation (Non-Medical):   Physical Activity:   . Days of Exercise per Week:   . Minutes of Exercise per Session:   Stress:   . Feeling of Stress :   Social Connections:   . Frequency of Communication with Friends and Family:   . Frequency of Social Gatherings with Friends and Family:   . Attends Religious Services:   . Active Member of Clubs or Organizations:   . Attends Archivist Meetings:   Marland Kitchen Marital Status:      Family History: The patient's family history includes Heart failure in her father.  ROS:   Please see the history of present illness.     All other systems reviewed and are negative.  EKGs/Labs/Other Studies Reviewed:    The following studies were reviewed today:  ECHO 2020   1. Left ventricular ejection fraction, by visual estimation, is 60 to  65%. The left ventricle has normal function. Normal left ventricular size.  There is mildly increased left ventricular hypertrophy.  2. Global right ventricle has normal systolic function.The right  ventricular size is normal.  3. Left atrial size was normal.  4. Right atrial size was normal.  5. The mitral valve is normal in structure. No evidence of mitral valve  regurgitation. No evidence of mitral stenosis.  6. The tricuspid valve is normal in structure. Tricuspid valve  regurgitation is  trivial.  7. The aortic valve is tricuspid Aortic valve regurgitation was not  visualized by color flow Doppler. Mild aortic valve sclerosis without  stenosis.  8. The pulmonic valve was normal in structure. Pulmonic valve  regurgitation is trivial by color flow Doppler.  9. Mildly elevated pulmonary artery systolic pressure.  10. The inferior vena cava is normal in size with greater than 50%  respiratory variability, suggesting right atrial pressure of 3 mmHg.  11. Normal LV systolic function; grade 2 diastolic dysfunction; mild LVH.   EKG:  EKG is  ordered today.  The ekg ordered today demonstrates SB 59.  Recent Labs: No results found for requested labs within last 8760 hours.  Recent Lipid Panel No results found for: CHOL, TRIG, HDL, CHOLHDL, VLDL, LDLCALC, LDLDIRECT  Physical Exam:    VS:  BP 120/88   Pulse 67   Ht 5\' 5"  (1.651 m)   Wt 255 lb 12.8 oz (116 kg)   SpO2 97%   BMI 42.57 kg/m     Wt Readings from Last 3 Encounters:  02/22/20 255 lb 12.8 oz (116 kg)  05/24/19 245 lb 12.8 oz (111.5 kg)  06/10/17 248 lb 9.6 oz (112.8 kg)     GEN:  Well nourished, well developed in no acute distress, overweight HEENT: Normal NECK: No JVD; No carotid bruits LYMPHATICS: No lymphadenopathy CARDIAC: RRR, no murmurs, rubs, gallops RESPIRATORY:  Clear to auscultation without rales, wheezing or rhonchi  ABDOMEN: Soft, non-tender, non-distended MUSCULOSKELETAL:  No edema; No deformity  SKIN: Warm and dry NEUROLOGIC:  Alert and oriented x 3 PSYCHIATRIC:  Normal affect   ASSESSMENT:    1. Mixed hyperlipidemia   2. Essential hypertension   3. Family history of premature CAD   4. Morbid obesity ([place])    PLAN:    In order of problems listed above:  Essential hypertension -Currently well controlled.  At home diastolic is better.  In the 70s.  Hyperlipidemia mixed -Has had trouble with statin intolerance in the past.  Overall doing well currently.  LDL 111 triglycerides  160  Premature family history of CAD -Father MI 40.  Continue to promote exercise.  She is in coverage on getting a tricycle type bicycle.  Morbid obesity -Continue encourage weight loss decrease carbohydrates.  Stationary bike at home.  Eager to buy a tricycle type bicycle.   Medication Adjustments/Labs and Tests Ordered: Current medicines are reviewed at length with the patient today.  Concerns regarding medicines are outlined above.  No orders of the defined types were placed in this encounter.  No orders of the defined types were placed in this encounter.   Patient Instructions  Medication Instructions:  The current medical regimen is effective;  continue present plan and medications.  *If you need a refill on your cardiac medications before your next appointment, please call your pharmacy*  Follow-Up: At [facility], you and your health needs are our priority.  As part of our continuing mission to provide you with exceptional heart care, we have created designated Provider Care Teams.  These Care Teams include your primary Cardiologist (physician) and Advanced Practice Providers (APPs -  Physician Assistants and Nurse Practitioners) who all work together to provide you with the care you need, when you need it.  We recommend signing up for the patient portal called "MyChart".  Sign up information is provided on this After Visit Summary.  MyChart is used to connect with patients for Virtual Visits (Telemedicine).  Patients are able to view lab/test results, encounter notes, upcoming appointments, etc.  Non-urgent messages can be sent to your provider as well.   To learn more about what you can do with MyChart, go to [email or web address].    Your next appointment:   12 month(s)  The format for your next appointment:   In Person  Provider:   Candee Furbish, MD  Thank you for choosing [facility]!!        Signed, Candee Furbish, MD  02/22/2020 3:11 PM    [facility]

## 2020-02-22 NOTE — Patient Instructions (Signed)
Medication Instructions:  The current medical regimen is effective;  continue present plan and medications.  *If you need a refill on your cardiac medications before your next appointment, please call your pharmacy*  Follow-Up: At [facility], you and your health needs are our priority.  As part of our continuing mission to provide you with exceptional heart care, we have created designated Provider Care Teams.  These Care Teams include your primary Cardiologist (physician) and Advanced Practice Providers (APPs -  Physician Assistants and Nurse Practitioners) who all work together to provide you with the care you need, when you need it.  We recommend signing up for the patient portal called "MyChart".  Sign up information is provided on this After Visit Summary.  MyChart is used to connect with patients for Virtual Visits (Telemedicine).  Patients are able to view lab/test results, encounter notes, upcoming appointments, etc.  Non-urgent messages can be sent to your provider as well.   To learn more about what you can do with MyChart, go to [email or web address].    Your next appointment:   12 month(s)  The format for your next appointment:   In Person  Provider:   Mark Skains, MD   Thank you for choosing [facility]!!      

## 2020-05-22 ENCOUNTER — Other Ambulatory Visit: Payer: Self-pay

## 2020-05-22 ENCOUNTER — Ambulatory Visit (INDEPENDENT_AMBULATORY_CARE_PROVIDER_SITE_OTHER): Payer: Medicare Other | Admitting: Family Medicine

## 2020-05-22 ENCOUNTER — Encounter (INDEPENDENT_AMBULATORY_CARE_PROVIDER_SITE_OTHER): Payer: Self-pay | Admitting: Family Medicine

## 2020-05-22 ENCOUNTER — Ambulatory Visit (INDEPENDENT_AMBULATORY_CARE_PROVIDER_SITE_OTHER): Payer: Self-pay | Admitting: Family Medicine

## 2020-05-22 VITALS — BP 127/77 | HR 62 | Temp 98.1°F | Ht 65.0 in | Wt 246.0 lb

## 2020-05-22 DIAGNOSIS — I10 Essential (primary) hypertension: Secondary | ICD-10-CM | POA: Diagnosis not present

## 2020-05-22 DIAGNOSIS — R0602 Shortness of breath: Secondary | ICD-10-CM

## 2020-05-22 DIAGNOSIS — E66813 Obesity, class 3: Secondary | ICD-10-CM

## 2020-05-22 DIAGNOSIS — E7849 Other hyperlipidemia: Secondary | ICD-10-CM

## 2020-05-22 DIAGNOSIS — R5383 Other fatigue: Secondary | ICD-10-CM | POA: Diagnosis not present

## 2020-05-22 DIAGNOSIS — Z0289 Encounter for other administrative examinations: Secondary | ICD-10-CM

## 2020-05-22 DIAGNOSIS — K219 Gastro-esophageal reflux disease without esophagitis: Secondary | ICD-10-CM

## 2020-05-22 DIAGNOSIS — E559 Vitamin D deficiency, unspecified: Secondary | ICD-10-CM

## 2020-05-22 DIAGNOSIS — Z1331 Encounter for screening for depression: Secondary | ICD-10-CM | POA: Diagnosis not present

## 2020-05-22 DIAGNOSIS — E538 Deficiency of other specified B group vitamins: Secondary | ICD-10-CM

## 2020-05-22 DIAGNOSIS — M109 Gout, unspecified: Secondary | ICD-10-CM

## 2020-05-22 DIAGNOSIS — Z6841 Body Mass Index (BMI) 40.0 and over, adult: Secondary | ICD-10-CM

## 2020-05-22 DIAGNOSIS — K760 Fatty (change of) liver, not elsewhere classified: Secondary | ICD-10-CM

## 2020-05-22 DIAGNOSIS — R7301 Impaired fasting glucose: Secondary | ICD-10-CM

## 2020-05-22 NOTE — Progress Notes (Signed)
Dear Dr. Tressia Danas,   Thank you for referring Renarda Cribb to our clinic. The following note includes my evaluation and treatment recommendations.  Chief Complaint:   OBESITY Evelyn Campbell (MR# [id number]) is a 64 y.o. female who presents for evaluation and treatment of obesity and related comorbidities. Current BMI is Body mass index is 40.94 kg/m. Janelli has been struggling with her weight for many years and has been unsuccessful in either losing weight, maintaining weight loss, or reaching her healthy weight goal.  Izzabella is currently in the action stage of change and ready to dedicate time achieving and maintaining a healthier weight. Kamla is interested in becoming our patient and working on intensive lifestyle modifications including (but not limited to) diet and exercise for weight loss.  Rosylnn is a retired [employer] of 79 years.  She lives with her husband and son ([id number]).  She says she exercises regularly.  Necia's habits were reviewed today and are as follows: Her family eats meals together, she thinks her family will eat healthier with her, her desired weight loss is 103 pounds, she has been heavy most of her life, she started gaining weight after pregnancy, her heaviest weight ever is her current weight, she snacks frequently in the evenings, she skips breakfast frequently, she sometimes makes poor food choices, she sometimes has problems with excessive hunger, she sometimes eats larger portions than normal and she struggles with emotional eating.  Depression Screen Ryn's Food and Mood (modified PHQ-9) score was 9.  Depression screen [facility] 2/9 05/22/2020  Decreased Interest 2  Down, Depressed, Hopeless 2  PHQ - 2 Score 4  Altered sleeping 1  Tired, decreased energy 1  Change in appetite 1  Feeling bad or failure about yourself  2  Trouble concentrating 0  Moving slowly or fidgety/restless 0  Suicidal thoughts 0  PHQ-9 Score 9  Difficult doing  work/chores Not difficult at all   Subjective:   1. Other fatigue Valeda denies daytime somnolence and denies waking up still tired. Patent has a history of symptoms of snoring. Haizley generally gets 7 hours of sleep per night, and states that she has generally restful sleep. Snoring is present. Apneic episodes are not present. Epworth Sleepiness Score is 3.  2. SOB (shortness of breath) on exertion Priyah notes increasing shortness of breath with exercising and seems to be worsening over time with weight gain. She notes getting out of breath sooner with activity than she used to. This has gotten worse recently. Travia denies shortness of breath at rest or orthopnea.  3. Essential hypertension Review: taking medications as instructed, no medication side effects noted, no chest pain on exertion, no dyspnea on exertion, no swelling of ankles.   BP Readings from Last 3 Encounters:  05/22/20 127/77  02/22/20 120/88  05/24/19 118/74   4. Other hyperlipidemia Denean has hyperlipidemia and has been trying to improve her cholesterol levels with intensive lifestyle modification including a low saturated fat diet, exercise and weight loss. She denies any chest pain, claudication or myalgias.  Lab Results  Component Value Date   ALT 32 08/14/2014   AST 26 08/14/2014   ALKPHOS 102 08/14/2014   BILITOT 0.4 08/14/2014   5. Gout, unspecified cause, unspecified chronicity, unspecified site Skarlett has the new diagnosis of gout.  She is taking daily allopurinol and colchicine for flares.  6. Gastroesophageal reflux disease, unspecified whether esophagitis present She has been experiencing acid reflux.  She is taking omeprazole daily.  7.  Vitamin D deficiency She is currently taking OTC vitamin D 2000 IU each day. She denies nausea, vomiting or muscle weakness.  8. Fatty liver Edee has fatty liver.  She denies abdominal pain or jaundice and has never been told of any liver problems in the  past. She denies excessive alcohol intake.  Lab Results  Component Value Date   ALT 32 08/14/2014   AST 26 08/14/2014   ALKPHOS 102 08/14/2014   BILITOT 0.4 08/14/2014   9. B12 deficiency She notes fatigue. She is not a vegetarian.  She does not have a previous diagnosis of pernicious anemia.  She does not have a history of weight loss surgery.   10. Fasting hyperglycemia Janeal has a history of some elevated blood glucose readings without a diagnosis of diabetes. She denies polyphagia.  11. Depression screening Zali was screened for depression as part of her new patient appointment.  Assessment/Plan:   1. Other fatigue Gretchen does feel that her weight is causing her energy to be lower than it should be. Fatigue may be related to obesity, depression or many other causes. Labs will be ordered, and in the meanwhile, Buffey will focus on self care including making healthy food choices, increasing physical activity and focusing on stress reduction.  - EKG 12-Lead - CBC with Differential/Platelet  2. SOB (shortness of breath) on exertion Keyaria does feel that she gets out of breath more easily that she used to when she exercises. Thaily's shortness of breath appears to be obesity related and exercise induced. She has agreed to work on weight loss and gradually increase exercise to treat her exercise induced shortness of breath. Will continue to monitor closely.  - CBC with Differential/Platelet - Comprehensive metabolic panel  3. Essential hypertension Marzell is working on healthy weight loss and exercise to improve blood pressure control. We will watch for signs of hypotension as she continues her lifestyle modifications.  - CBC with Differential/Platelet - Comprehensive metabolic panel  4. Other hyperlipidemia Cardiovascular risk and specific lipid/LDL goals reviewed.  We discussed several lifestyle modifications today and Lorelai will continue to work on diet, exercise  and weight loss efforts. Orders and follow up as documented in patient record.   Counseling Intensive lifestyle modifications are the first line treatment for this issue. . Dietary changes: Increase soluble fiber. Decrease simple carbohydrates. . Exercise changes: Moderate to vigorous-intensity aerobic activity 150 minutes per week if tolerated. . Lipid-lowering medications: see documented in medical record.  5. Gout, unspecified cause, unspecified chronicity, unspecified site Current treatment plan is effective, no change in therapy.  6. Gastroesophageal reflux disease, unspecified whether esophagitis present Intensive lifestyle modifications are the first line treatment for this issue. We discussed several lifestyle modifications today and she will continue to work on diet, exercise and weight loss efforts. Orders and follow up as documented in patient record.   Counseling . If a person has gastroesophageal reflux disease (GERD), food and stomach acid move back up into the esophagus and cause symptoms or problems such as damage to the esophagus. . Anti-reflux measures include: raising the head of the bed, avoiding tight clothing or belts, avoiding eating late at night, not lying down shortly after mealtime, and achieving weight loss. . Avoid ASA, NSAID's, caffeine, alcohol, and tobacco.  . OTC Pepcid and/or Tums are often very helpful for as needed use.  Marland Kitchen However, for persisting chronic or daily symptoms, stronger medications like Omeprazole may be needed. . You may need to avoid foods and drinks such  as: ? Coffee and tea (with or without caffeine). ? Drinks that contain alcohol. ? Energy drinks and sports drinks. ? Bubbly (carbonated) drinks or sodas. ? Chocolate and cocoa. ? Peppermint and mint flavorings. ? Garlic and onions. ? Horseradish. ? Spicy and acidic foods. These include peppers, chili powder, curry powder, vinegar, hot sauces, and BBQ sauce. ? Citrus fruit juices and  citrus fruits, such as oranges, lemons, and limes. ? Tomato-based foods. These include red sauce, chili, salsa, and pizza with red sauce. ? Fried and fatty foods. These include donuts, french fries, potato chips, and high-fat dressings. ? High-fat meats. These include hot dogs, rib eye steak, sausage, ham, and bacon.  7. Vitamin D deficiency Low Vitamin D level contributes to fatigue and are associated with obesity, breast, and colon cancer. She agrees to continue to take OTC Vitamin D @2 ,000 IU daily and will follow-up for routine testing of Vitamin D, at least 2-3 times per year to avoid over-replacement.  - VITAMIN D 25 Hydroxy (Vit-D Deficiency, Fractures)  8. Fatty liver Shelia was educated the importance of weight loss. Guida agreed to continue with her weight loss efforts with healthier diet and exercise as an essential part of her treatment plan.  9. B12 deficiency The diagnosis was reviewed with the patient. Counseling provided today, see below. We will continue to monitor. Orders and follow up as documented in patient record.  Counseling . The body needs vitamin B12: to make red blood cells; to make DNA; and to help the nerves work properly so they can carry messages from the brain to the body.  . The main causes of vitamin B12 deficiency include dietary deficiency, digestive diseases, pernicious anemia, and having a surgery in which part of the stomach or small intestine is removed.  . Certain medicines can make it harder for the body to absorb vitamin B12. These medicines include: heartburn medications; some antibiotics; some medications used to treat diabetes, gout, and high cholesterol.  . In some cases, there are no symptoms of this condition. If the condition leads to anemia or nerve damage, various symptoms can occur, such as weakness or fatigue, shortness of breath, and numbness or tingling in your hands and feet.   . Treatment:  o May include taking vitamin B12  supplements.  o Avoid alcohol.  o Eat lots of healthy foods that contain vitamin B12: - Beef, pork, chicken, [place], and organ meats, such as liver.  - Seafood: This includes clams, rainbow trout, salmon, tuna, and haddock. Eggs.  - Cereal and dairy products that are fortified: This means that vitamin B12 has been added to the food.   - Vitamin B12  10. Fasting hyperglycemia Fasting labs will be obtained and results with be discussed with Ressie in 2 weeks at her follow up visit. In the meanwhile Vallerie was started on a lower simple carbohydrate diet and will work on weight loss efforts.  - Hemoglobin A1c - Insulin, random  11. Depression screening Markel had a positive depression screening. Depression is commonly associated with obesity and often results in emotional eating behaviors. We will monitor this closely and work on CBT to help improve the non-hunger eating patterns. Referral to Psychology may be required if no improvement is seen as she continues in our clinic.  12. Class 3 severe obesity with serious comorbidity and body mass index (BMI) of 40.0 to 44.9 in adult, unspecified obesity type (HCC) Brendalee is currently in the action stage of change and her goal is to  continue with weight loss efforts. I recommend Cecelia begin the structured treatment plan as follows:  She has agreed to the Category 1 Plan.  Exercise goals: No exercise has been prescribed at this time.   Behavioral modification strategies: increasing lean protein intake, decreasing simple carbohydrates, increasing vegetables, increasing water intake, decreasing liquid calories, decreasing sodium intake and increasing high fiber foods.  She was informed of the importance of frequent follow-up visits to maximize her success with intensive lifestyle modifications for her multiple health conditions. She was informed we would discuss her lab results at her next visit unless there is a critical issue that needs to be  addressed sooner. Darletta agreed to keep her next visit at the agreed upon time to discuss these results.  Objective:   Blood pressure 127/77, pulse 62, temperature 98.1 F (36.7 C), temperature source Oral, height 5\' 5"  (1.651 m), weight 246 lb (111.6 kg), SpO2 96 %. Body mass index is 40.94 kg/m.  EKG: Normal sinus rhythm, rate 61 bpm.  Indirect Calorimeter completed today shows a VO2 of 180 and a REE of 1252.  Her calculated basal metabolic rate is 1610 thus her basal metabolic rate is worse than expected.  General: Cooperative, alert, well developed, in no acute distress. HEENT: Conjunctivae and lids unremarkable. Cardiovascular: Regular rhythm.  Lungs: Normal work of breathing. Neurologic: No focal deficits.   Lab Results  Component Value Date   CREATININE 0.70 06/23/2015   BUN 11 06/23/2015   NA 138 06/23/2015   K 3.7 06/23/2015   CL 102 06/23/2015   CO2 27 06/23/2015   Lab Results  Component Value Date   ALT 32 08/14/2014   AST 26 08/14/2014   ALKPHOS 102 08/14/2014   BILITOT 0.4 08/14/2014   Lab Results  Component Value Date   WBC 7.3 06/23/2015   HGB 13.3 06/23/2015   HCT 40.6 06/23/2015   MCV 82.2 06/23/2015   PLT 200 06/23/2015   Attestation Statements:   This is the patient's first visit at [facility]. The patient's NEW PATIENT PACKET was reviewed at length. Included in the packet: current and past health history, medications, allergies, ROS, gynecologic history (women only), surgical history, family history, social history, weight history, weight loss surgery history (for those that have had weight loss surgery), nutritional evaluation, mood and food questionnaire, PHQ9, Epworth questionnaire, sleep habits questionnaire, patient life and health improvement goals questionnaire. These will all be scanned into the patient's chart under media.   During the visit, I independently reviewed the patient's EKG, bioimpedance scale results, and  indirect calorimeter results. I used this information to tailor a meal plan for the patient that will help her to lose weight and will improve her obesity-related conditions going forward. I performed a medically necessary appropriate examination and/or evaluation. I discussed the assessment and treatment plan with the patient. The patient was provided an opportunity to ask questions and all were answered. The patient agreed with the plan and demonstrated an understanding of the instructions. Labs were ordered at this visit and will be reviewed at the next visit unless more critical results need to be addressed immediately. Clinical information was updated and documented in the EMR.   Time spent on visit including pre-visit chart review and post-visit charting and care was 65 minutes.   I, Water quality scientist, CMA, am acting as transcriptionist for [name], DO  I have reviewed the above documentation for accuracy and completeness, and I agree with the above. [name], DO

## 2020-05-24 LAB — COMPREHENSIVE METABOLIC PANEL
ALT: 33 IU/L — ABNORMAL HIGH (ref 0–32)
AST: 31 IU/L (ref 0–40)
Albumin/Globulin Ratio: 2.2 (ref 1.2–2.2)
Albumin: 5 g/dL — ABNORMAL HIGH (ref 3.8–4.8)
Alkaline Phosphatase: 116 IU/L (ref 48–121)
BUN/Creatinine Ratio: 24 (ref 12–28)
BUN: 19 mg/dL (ref 8–27)
Bilirubin Total: 0.4 mg/dL (ref 0.0–1.2)
CO2: 22 mmol/L (ref 20–29)
Calcium: 10.1 mg/dL (ref 8.7–10.3)
Chloride: 98 mmol/L (ref 96–106)
Creatinine, Ser: 0.78 mg/dL (ref 0.57–1.00)
GFR calc Af Amer: 92 mL/min/{1.73_m2} (ref >59)
GFR calc non Af Amer: 79 mL/min/{1.73_m2} (ref >59)
Globulin, Total: 2.3 g/dL (ref 1.5–4.5)
Glucose: 91 mg/dL (ref 65–99)
Potassium: 4.6 mmol/L (ref 3.5–5.2)
Sodium: 138 mmol/L (ref 134–144)
Total Protein: 7.3 g/dL (ref 6.0–8.5)

## 2020-05-24 LAB — CBC WITH DIFFERENTIAL/PLATELET
Basophils Absolute: 0 10*3/uL (ref 0.0–0.2)
Basos: 1 %
EOS (ABSOLUTE): 0.1 10*3/uL (ref 0.0–0.4)
Eos: 2 %
Hematocrit: 44 % (ref 34.0–46.6)
Hemoglobin: 14.6 g/dL (ref 11.1–15.9)
Immature Grans (Abs): 0 10*3/uL (ref 0.0–0.1)
Immature Granulocytes: 0 %
Lymphocytes Absolute: 2.8 10*3/uL (ref 0.7–3.1)
Lymphs: 45 %
MCH: 28.2 pg (ref 26.6–33.0)
MCHC: 33.2 g/dL (ref 31.5–35.7)
MCV: 85 fL (ref 79–97)
Monocytes Absolute: 0.9 10*3/uL (ref 0.1–0.9)
Monocytes: 14 %
Neutrophils Absolute: 2.4 10*3/uL (ref 1.4–7.0)
Neutrophils: 38 %
Platelets: 186 10*3/uL (ref 150–450)
RBC: 5.18 x10E6/uL (ref 3.77–5.28)
RDW: 15.2 % (ref 11.7–15.4)
WBC: 6.2 10*3/uL (ref 3.4–10.8)

## 2020-05-24 LAB — HEMOGLOBIN A1C
Est. average glucose Bld gHb Est-mCnc: 123 mg/dL
Hgb A1c MFr Bld: 5.9 % — ABNORMAL HIGH (ref 4.8–5.6)

## 2020-05-24 LAB — INSULIN, RANDOM: INSULIN: 22.1 u[IU]/mL (ref 2.6–24.9)

## 2020-05-24 LAB — VITAMIN B12: Vitamin B-12: 1309 pg/mL — ABNORMAL HIGH (ref 232–1245)

## 2020-05-24 LAB — VITAMIN D 25 HYDROXY (VIT D DEFICIENCY, FRACTURES): Vit D, 25-Hydroxy: 29.3 ng/mL — ABNORMAL LOW (ref 30.0–100.0)

## 2020-05-25 ENCOUNTER — Encounter (INDEPENDENT_AMBULATORY_CARE_PROVIDER_SITE_OTHER): Payer: Self-pay | Admitting: Family Medicine

## 2020-06-05 ENCOUNTER — Encounter (INDEPENDENT_AMBULATORY_CARE_PROVIDER_SITE_OTHER): Payer: Self-pay | Admitting: Family Medicine

## 2020-06-05 ENCOUNTER — Ambulatory Visit (INDEPENDENT_AMBULATORY_CARE_PROVIDER_SITE_OTHER): Payer: Medicare Other | Admitting: Family Medicine

## 2020-06-05 ENCOUNTER — Other Ambulatory Visit: Payer: Self-pay

## 2020-06-05 VITALS — BP 124/84 | HR 56 | Temp 98.1°F | Ht 65.0 in | Wt 242.0 lb

## 2020-06-05 DIAGNOSIS — E559 Vitamin D deficiency, unspecified: Secondary | ICD-10-CM | POA: Diagnosis not present

## 2020-06-05 DIAGNOSIS — K760 Fatty (change of) liver, not elsewhere classified: Secondary | ICD-10-CM | POA: Diagnosis not present

## 2020-06-05 DIAGNOSIS — Z9189 Other specified personal risk factors, not elsewhere classified: Secondary | ICD-10-CM

## 2020-06-05 DIAGNOSIS — R7303 Prediabetes: Secondary | ICD-10-CM

## 2020-06-05 DIAGNOSIS — Z6841 Body Mass Index (BMI) 40.0 and over, adult: Secondary | ICD-10-CM

## 2020-06-09 NOTE — Progress Notes (Signed)
Chief Complaint:   OBESITY Evelyn Campbell is here to discuss her progress with her obesity treatment plan along with follow-up of her obesity related diagnoses. Brunette is on the Category 1 Plan and states she is following her eating plan approximately 98% of the time. Samora states she is doing cardio for 5-8 miles 7 days per week.  Today's visit was #: 2 Starting weight: 246 lbs Starting date: 05/22/2020 Today's weight: 242 lbs Today's date: 06/05/2020 Total lbs lost to date: 4 lbs Total lbs lost since last in-office visit: 4 lbs  Interim History: [name] says she is happy with her weight loss.  No major concerns with the plan. Labs reviewed.  Subjective:   1. Prediabetes  Lab Results  Component Value Date   HGBA1C 5.9 (H) 05/22/2020   Lab Results  Component Value Date   INSULIN 22.1 05/22/2020   2. NAFLD (nonalcoholic fatty liver disease)  Lab Results  Component Value Date   ALT 33 (H) 05/22/2020   AST 31 05/22/2020   ALKPHOS 116 05/22/2020   BILITOT 0.4 05/22/2020   3. Vitamin D deficiency Evelyn Campbell's Vitamin D level was 29.3 on 05/22/2020. She is currently taking no vitamin D supplement.   Assessment/Plan:   1. Prediabetes Goal is HgbA1c < 5.7 and insulin level closer to 5. Evelyn Campbell will continue to work on weight loss, exercise, and decreasing simple carbohydrates to help decrease the risk of diabetes.   2. NAFLD (nonalcoholic fatty liver disease) Goal: 10% weight loss or 25 pounds.  Counseling . NAFLD is treated by reducing body weight among patients with overweight or obesity. . Aerobic and resistance physical activity are important to help achieve a healthy body weight BUT also increases peripheral insulin sensitivity, reducing delivery of free fatty acids and glucose to the liver. Exercise also increases intrahepatic fatty acid oxidation, decreasing fatty acid synthesis, and helps prevent mitochondrial and hepatocellular damage. . The are no medications approved  for the treatment of NAFLD, but we know of a few drugs that can help reduce hepatic fat, including metformin, peroxisome proliferator activated receptor gamma agonists (Actos and Avandia), and glucagon like protein 1 agonists (liraglutide, semaglutide, and dulaglutide).   3. Vitamin D deficiency Low Vitamin D level contributes to fatigue and are associated with obesity, breast, and colon cancer. She agrees to start to take prescription Vitamin D @50 ,000 IU every week and will follow-up for routine testing of Vitamin D, at least 2-3 times per year to avoid over-replacement.  4. At risk for heart disease Mardene was given approximately 15 minutes of coronary artery disease prevention counseling today. She is 64 y.o. female and has risk factors for heart disease including obesity and metabolic syndrome. We discussed intensive lifestyle modifications today with an emphasis on specific weight loss instructions and strategies.   Repetitive spaced learning was employed today to elicit superior memory formation and behavioral change.  5. Class 3 severe obesity with serious comorbidity and body mass index (BMI) of 40.0 to 44.9 in adult, unspecified obesity type (HCC) Evelyn Campbell is currently in the action stage of change. As such, her goal is to continue with weight loss efforts. She has agreed to the Category 1 Plan.   Exercise goals: As is.  Behavioral modification strategies: increasing lean protein intake, decreasing simple carbohydrates and increasing high fiber foods.  Jaelie has agreed to follow-up with our clinic in 2-3 weeks. She was informed of the importance of frequent follow-up visits to maximize her success with intensive lifestyle modifications for  her multiple health conditions.   Objective:   Blood pressure 124/84, pulse (!) 56, temperature 98.1 F (36.7 C), temperature source Oral, height 5\' 5"  (1.651 m), weight 242 lb (109.8 kg), SpO2 99 %. Body mass index is 40.27 kg/m.  General:  Cooperative, alert, well developed, in no acute distress. HEENT: Conjunctivae and lids unremarkable. Cardiovascular: Regular rhythm.  Lungs: Normal work of breathing. Neurologic: No focal deficits.   Lab Results  Component Value Date   CREATININE 0.78 05/22/2020   BUN 19 05/22/2020   NA 138 05/22/2020   K 4.6 05/22/2020   CL 98 05/22/2020   CO2 22 05/22/2020   Lab Results  Component Value Date   ALT 33 (H) 05/22/2020   AST 31 05/22/2020   ALKPHOS 116 05/22/2020   BILITOT 0.4 05/22/2020   Lab Results  Component Value Date   HGBA1C 5.9 (H) 05/22/2020   Lab Results  Component Value Date   INSULIN 22.1 05/22/2020   Lab Results  Component Value Date   WBC 6.2 05/22/2020   HGB 14.6 05/22/2020   HCT 44.0 05/22/2020   MCV 85 05/22/2020   PLT 186 05/22/2020   Attestation Statements:   Reviewed by clinician on day of visit: allergies, medications, problem list, medical history, surgical history, family history, social history, and previous encounter notes.  I, Water quality scientist, CMA, am acting as transcriptionist for [name], DO  I have reviewed the above documentation for accuracy and completeness, and I agree with the above. [name], DO

## 2020-07-01 ENCOUNTER — Other Ambulatory Visit: Payer: Self-pay

## 2020-07-01 ENCOUNTER — Ambulatory Visit (INDEPENDENT_AMBULATORY_CARE_PROVIDER_SITE_OTHER): Payer: Medicare Other | Admitting: Family Medicine

## 2020-07-01 ENCOUNTER — Encounter (INDEPENDENT_AMBULATORY_CARE_PROVIDER_SITE_OTHER): Payer: Self-pay | Admitting: Family Medicine

## 2020-07-01 VITALS — BP 116/77 | HR 59 | Temp 98.0°F | Ht 65.0 in | Wt 236.0 lb

## 2020-07-01 DIAGNOSIS — E559 Vitamin D deficiency, unspecified: Secondary | ICD-10-CM | POA: Diagnosis not present

## 2020-07-01 DIAGNOSIS — Z9189 Other specified personal risk factors, not elsewhere classified: Secondary | ICD-10-CM | POA: Diagnosis not present

## 2020-07-01 DIAGNOSIS — R7303 Prediabetes: Secondary | ICD-10-CM | POA: Diagnosis not present

## 2020-07-01 DIAGNOSIS — Z6839 Body mass index (BMI) 39.0-39.9, adult: Secondary | ICD-10-CM

## 2020-07-01 NOTE — Patient Instructions (Signed)

## 2020-07-02 NOTE — Progress Notes (Signed)
Chief Complaint:   OBESITY Evelyn Campbell is here to discuss her progress with her obesity treatment plan along with follow-up of her obesity related diagnoses. Evelyn Campbell is on the Category 1 Plan and states she is following her eating plan approximately 100% of the time. Evelyn Campbell states she is walking for 5 miles 7 times per week.  Today's visit was #: 3 Starting weight: 246 lbs Starting date: 06/05/2020 Today's weight: 236 lbs Today's date: 07/01/2020 Total lbs lost to date: 10 lbs Total lbs lost since last in-office visit: 6 lbs Total weight loss percentage to date: -4.07%  Interim History: Evelyn Campbell has gout, which is causing left knee pain.  She was walking 5 miles per day, but had to stop for 2 weeks.  Her medications were adjusted by her PCP last week.  She obtained a COVID booster last week.  She says she is working hard to continue Category 1.  She would like to hold on starting metformin for prediabetes and weight management.  Assessment/Plan:   1. Prediabetes Goal is HgbA1c < 5.7 and insulin level closer to 5.  During insulin resistance, several metabolic alterations induce the development of cardiovascular disease. For instance, insulin resistance can induce an imbalance in glucose metabolism that generates chronic hyperglycemia, which in turn triggers oxidative stress and causes an inflammatory response that leads to cell damage. Insulin resistance can also alter systemic lipid metabolism which then leads to the development of dyslipidemia and the well-known lipid triad: (1) high levels of plasma triglycerides, (2) low levels of high-density lipoprotein, and (3) the appearance of small dense low-density lipoproteins. This triad, along with endothelial dysfunction, which can also be induced by aberrant insulin signaling, contribute to atherosclerotic plaque formation.  Plan:  Recheck labs in 3 months.  2. Vitamin D deficiency Current vitamin D is 29.3, tested on 05/22/2020. Not at goal.  Optimal goal > 50 ng/dL. There is also evidence to support a goal of >70 ng/dL in patients with cancer and heart disease. Plan: Continue Vitamin D @50 ,000 IU every week with follow-up for routine testing of Vitamin D at least 2-3 times per year to avoid over-replacement.  3. At risk for heart disease Evelyn Campbell was given approximately 15 minutes of coronary artery disease prevention counseling today. She is 64 y.o. female and has risk factors for heart disease including obesity and prediabetes. We discussed intensive lifestyle modifications today with an emphasis on specific weight loss instructions and strategies.   Repetitive spaced learning was employed today to elicit superior memory formation and behavioral change.  4. Class 2 severe obesity with serious comorbidity and body mass index (BMI) of 39.0 to 39.9 in adult, unspecified obesity type (HCC) Evelyn Campbell is currently in the action stage of change. As such, her goal is to continue with weight loss efforts. She has agreed to the Category 1 Plan.   Exercise goals: For substantial health benefits, adults should do at least 150 minutes (2 hours and 30 minutes) a week of moderate-intensity, or 75 minutes (1 hour and 15 minutes) a week of vigorous-intensity aerobic physical activity, or an equivalent combination of moderate- and vigorous-intensity aerobic activity. Aerobic activity should be performed in episodes of at least 10 minutes, and preferably, it should be spread throughout the week.  Behavioral modification strategies: increasing lean protein intake, increasing water intake, decreasing sodium intake and increasing high fiber foods.  Evelyn Campbell has agreed to follow-up with our clinic in 2-3 weeks. She was informed of the importance of frequent follow-up visits  to maximize her success with intensive lifestyle modifications for her multiple health conditions.   Objective:   Blood pressure 116/77, pulse (!) 59, temperature 98 F (36.7 C),  temperature source Oral, height 5\' 5"  (1.651 m), weight 236 lb (107 kg), SpO2 96 %. Body mass index is 39.27 kg/m.  General: Cooperative, alert, well developed, in no acute distress. HEENT: Conjunctivae and lids unremarkable. Cardiovascular: Regular rhythm.  Lungs: Normal work of breathing. Neurologic: No focal deficits.   Lab Results  Component Value Date   CREATININE 0.78 05/22/2020   BUN 19 05/22/2020   NA 138 05/22/2020   K 4.6 05/22/2020   CL 98 05/22/2020   CO2 22 05/22/2020   Lab Results  Component Value Date   ALT 33 (H) 05/22/2020   AST 31 05/22/2020   ALKPHOS 116 05/22/2020   BILITOT 0.4 05/22/2020   Lab Results  Component Value Date   HGBA1C 5.9 (H) 05/22/2020   Lab Results  Component Value Date   INSULIN 22.1 05/22/2020   Lab Results  Component Value Date   WBC 6.2 05/22/2020   HGB 14.6 05/22/2020   HCT 44.0 05/22/2020   MCV 85 05/22/2020   PLT 186 05/22/2020   Attestation Statements:   Reviewed by clinician on day of visit: allergies, medications, problem list, medical history, surgical history, family history, social history, and previous encounter notes.  I, Water quality scientist, CMA, am acting as transcriptionist for [name], DO  I have reviewed the above documentation for accuracy and completeness, and I agree with the above. [name], DO

## 2020-07-22 ENCOUNTER — Other Ambulatory Visit: Payer: Self-pay

## 2020-07-22 ENCOUNTER — Encounter (INDEPENDENT_AMBULATORY_CARE_PROVIDER_SITE_OTHER): Payer: Self-pay | Admitting: Family Medicine

## 2020-07-22 ENCOUNTER — Ambulatory Visit (INDEPENDENT_AMBULATORY_CARE_PROVIDER_SITE_OTHER): Payer: Medicare Other | Admitting: Family Medicine

## 2020-07-22 VITALS — BP 140/82 | HR 56 | Temp 97.8°F | Ht 65.0 in | Wt 236.0 lb

## 2020-07-22 DIAGNOSIS — E559 Vitamin D deficiency, unspecified: Secondary | ICD-10-CM

## 2020-07-22 DIAGNOSIS — Z6839 Body mass index (BMI) 39.0-39.9, adult: Secondary | ICD-10-CM | POA: Diagnosis not present

## 2020-07-22 DIAGNOSIS — R7303 Prediabetes: Secondary | ICD-10-CM | POA: Diagnosis not present

## 2020-07-24 NOTE — Progress Notes (Signed)
Chief Complaint:   OBESITY Evelyn Campbell is here to discuss her progress with her obesity treatment plan along with follow-up of her obesity related diagnoses. Britiny is on the Category 1 Plan and states she is following her eating plan approximately 95-100% of the time. Chiana states she is walking/strength training/doing cardio for 60+ minutes 7 times per week.  Today's visit was #: 4 Starting weight: 246 lbs Starting date: 06/05/2020 Today's weight: 236 lbs Today's date: 07/22/2020 Total lbs lost to date: 10 lbs Total lbs lost since last in-office visit: 0 Total weight loss percentage to date: -4.07%  Interim History: Trang is down 0.8 pounds today.  She is frustrated that the scale has not moved more. She continues to follow the plan and exercise regularly. She endorses more energy and feeling better overall.  Assessment/Plan:   1. Vitamin D deficiency Current vitamin D is 29.3, tested on 05/22/2020. Not at goal. Optimal goal > 50 ng/dL. There is also evidence to support a goal of >70 ng/dL in patients with cancer and heart disease. Plan: Continue Vitamin D @50 ,000 IU every week with follow-up for routine testing of Vitamin D at least 2-3 times per year to avoid over-replacement.  2. Prediabetes Not at goal. Goal is HgbA1c < 5.7 and insulin level closer to 5. She will continue to focus on protein-rich, low simple carbohydrate foods. We reviewed the importance of hydration, regular exercise for stress reduction, and restorative sleep.   Lab Results  Component Value Date   HGBA1C 5.9 (H) 05/22/2020   Lab Results  Component Value Date   INSULIN 22.1 05/22/2020   3. Class 2 severe obesity with serious comorbidity and body mass index (BMI) of 39.0 to 39.9 in adult, unspecified obesity type (HCC) Shaleen is currently in the action stage of change. As such, her goal is to continue with weight loss efforts. She has agreed to the Category 1 Plan.   Exercise goals: For substantial  health benefits, adults should do at least 150 minutes (2 hours and 30 minutes) a week of moderate-intensity, or 75 minutes (1 hour and 15 minutes) a week of vigorous-intensity aerobic physical activity, or an equivalent combination of moderate- and vigorous-intensity aerobic activity. Aerobic activity should be performed in episodes of at least 10 minutes, and preferably, it should be spread throughout the week.  Behavioral modification strategies: decreasing simple carbohydrates and keeping a strict food journal.  Rainah has agreed to follow-up with our clinic in 2-3 weeks. She was informed of the importance of frequent follow-up visits to maximize her success with intensive lifestyle modifications for her multiple health conditions.   Objective:   Blood pressure 140/82, pulse (!) 56, temperature 97.8 F (36.6 C), temperature source Oral, height 5\' 5"  (1.651 m), weight 236 lb (107 kg), SpO2 95 %. Body mass index is 39.27 kg/m.  General: Cooperative, alert, well developed, in no acute distress. HEENT: Conjunctivae and lids unremarkable. Cardiovascular: Regular rhythm.  Lungs: Normal work of breathing. Neurologic: No focal deficits.   Lab Results  Component Value Date   CREATININE 0.78 05/22/2020   BUN 19 05/22/2020   NA 138 05/22/2020   K 4.6 05/22/2020   CL 98 05/22/2020   CO2 22 05/22/2020   Lab Results  Component Value Date   ALT 33 (H) 05/22/2020   AST 31 05/22/2020   ALKPHOS 116 05/22/2020   BILITOT 0.4 05/22/2020   Lab Results  Component Value Date   HGBA1C 5.9 (H) 05/22/2020   Lab Results  Component Value Date   INSULIN 22.1 05/22/2020   Lab Results  Component Value Date   WBC 6.2 05/22/2020   HGB 14.6 05/22/2020   HCT 44.0 05/22/2020   MCV 85 05/22/2020   PLT 186 05/22/2020   Obesity Behavioral Intervention:   Approximately 15 minutes were spent on the discussion below.  ASK: We discussed the diagnosis of obesity with Miku today and Hareem agreed  to give [name] permission to discuss obesity behavioral modification therapy today.  ASSESS: Falana has the diagnosis of obesity and her BMI today is 39.3. Savanna is in the action stage of change.   ADVISE: Chene was educated on the multiple health risks of obesity as well as the benefit of weight loss to improve her health. She was advised of the need for long term treatment and the importance of lifestyle modifications to improve her current health and to decrease her risk of future health problems.  AGREE: Multiple dietary modification options and treatment options were discussed and Sojourner agreed to follow the recommendations documented in the above note.  ARRANGE: Dia was educated on the importance of frequent visits to treat obesity as outlined per CMS and USPSTF guidelines and agreed to schedule her next follow up appointment today.  Attestation Statements:   Reviewed by clinician on day of visit: allergies, medications, problem list, medical history, surgical history, family history, social history, and previous encounter notes.  I, Water quality scientist, CMA, am acting as transcriptionist for [name], DO  I have reviewed the above documentation for accuracy and completeness, and I agree with the above. [name], DO

## 2020-08-14 ENCOUNTER — Encounter (INDEPENDENT_AMBULATORY_CARE_PROVIDER_SITE_OTHER): Payer: Self-pay | Admitting: Family Medicine

## 2020-08-14 ENCOUNTER — Ambulatory Visit (INDEPENDENT_AMBULATORY_CARE_PROVIDER_SITE_OTHER): Payer: Medicare Other | Admitting: Family Medicine

## 2020-08-14 ENCOUNTER — Other Ambulatory Visit: Payer: Self-pay

## 2020-08-14 VITALS — BP 136/81 | HR 58 | Temp 98.5°F | Ht 65.0 in | Wt 232.0 lb

## 2020-08-14 DIAGNOSIS — E7849 Other hyperlipidemia: Secondary | ICD-10-CM | POA: Diagnosis not present

## 2020-08-14 DIAGNOSIS — R262 Difficulty in walking, not elsewhere classified: Secondary | ICD-10-CM

## 2020-08-14 DIAGNOSIS — I10 Essential (primary) hypertension: Secondary | ICD-10-CM | POA: Diagnosis not present

## 2020-08-14 DIAGNOSIS — E65 Localized adiposity: Secondary | ICD-10-CM

## 2020-08-14 DIAGNOSIS — Z6838 Body mass index (BMI) 38.0-38.9, adult: Secondary | ICD-10-CM

## 2020-08-18 MED ORDER — NIACIN ER (ANTIHYPERLIPIDEMIC) 1000 MG PO TBCR: 2000.0000 mg | EXTENDED_RELEASE_TABLET | Freq: Every day | ORAL | 0 refills | Status: AC

## 2020-08-18 NOTE — Progress Notes (Signed)
Chief Complaint:   OBESITY Evelyn Campbell is here to discuss her progress with her obesity treatment plan along with follow-up of her obesity related diagnoses.   Today's visit was #: 5 Starting weight: 246 lbs Starting date: 06/05/2020 Today's weight: 232 lbs Today's date: 08/14/2020 Total lbs lost to date: 14 lbs Total lbs lost since last in-office visit: 4 lbs  Interim History: Evelyn Campbell says she enjoyed the Rolling [employer] concert.  She is still happy with her meal plan.  Assessment/Plan:   1. Trouble walking Randall endorses difficulty walking.  Will refer to PT.  2. Other hyperlipidemia Stable. I have sent in a refill for Niaspan as a courtesy. She will continue to focus on protein-rich, low simple carbohydrate foods. We reviewed the importance of hydration, regular exercise for stress reduction, and restorative sleep.   Lab Results  Component Value Date   ALT 33 (H) 05/22/2020   AST 31 05/22/2020   ALKPHOS 116 05/22/2020   BILITOT 0.4 05/22/2020   - Refill niacin (NIASPAN) 1000 MG CR tablet; Take 2 tablets (2,000 mg total) by mouth at bedtime.  Dispense: 30 tablet; Refill: 0  3. Essential hypertension At goal. Medications: Tenormin 100 mg at bedtime. Plan: Monitor home BP. Diet: Avoid buying foods that are: processed, frozen, or prepackaged to avoid excess salt. Recheck BP at next visit. The patient understands monitoring parameters and red flags.   BP Readings from Last 3 Encounters:  08/14/20 136/81  07/22/20 140/82  07/01/20 116/77   Lab Results  Component Value Date   CREATININE 0.78 05/22/2020   4. Visceral obesity Current visceral fat rating: 16. Visceral adipose tissue is a hormonally active component of total body fat. This body composition phenotype is associated with medical disorders such as metabolic syndrome, cardiovascular disease and several malignancies including prostate, breast, and colorectal cancers. Goal: Lose 7-10% of starting weight. Visceral fat  rating should be < 13.  5. Class 2 severe obesity with serious comorbidity and body mass index (BMI) of 38.0 to 38.9 in adult, unspecified obesity type (HCC)  Evelyn Campbell is currently in the action stage of change. As such, her goal is to continue with weight loss efforts. She has agreed to keeping a food journal and adhering to recommended goals of 1200 calories and 95 grams of protein.   Exercise goals: For substantial health benefits, adults should do at least 150 minutes (2 hours and 30 minutes) a week of moderate-intensity, or 75 minutes (1 hour and 15 minutes) a week of vigorous-intensity aerobic physical activity, or an equivalent combination of moderate- and vigorous-intensity aerobic activity. Aerobic activity should be performed in episodes of at least 10 minutes, and preferably, it should be spread throughout the week.  Behavioral modification strategies: increasing lean protein intake, decreasing simple carbohydrates, increasing vegetables and increasing water intake.  [name] has agreed to follow-up with our clinic in 3 weeks. She was informed of the importance of frequent follow-up visits to maximize her success with intensive lifestyle modifications for her multiple health conditions.   Objective:   Blood pressure 136/81, pulse (!) 58, temperature 98.5 F (36.9 C), temperature source Oral, height 5\' 5"  (1.651 m), weight 232 lb (105.2 kg), SpO2 97 %. Body mass index is 38.61 kg/m.  General: Cooperative, alert, well developed, in no acute distress. HEENT: Conjunctivae and lids unremarkable. Cardiovascular: Regular rhythm.  Lungs: Normal work of breathing. Neurologic: No focal deficits.   Lab Results  Component Value Date   CREATININE 0.78 05/22/2020   BUN  19 05/22/2020   NA 138 05/22/2020   K 4.6 05/22/2020   CL 98 05/22/2020   CO2 22 05/22/2020   Lab Results  Component Value Date   ALT 33 (H) 05/22/2020   AST 31 05/22/2020   ALKPHOS 116 05/22/2020   BILITOT 0.4  05/22/2020   Lab Results  Component Value Date   HGBA1C 5.9 (H) 05/22/2020   Lab Results  Component Value Date   INSULIN 22.1 05/22/2020   Lab Results  Component Value Date   WBC 6.2 05/22/2020   HGB 14.6 05/22/2020   HCT 44.0 05/22/2020   MCV 85 05/22/2020   PLT 186 05/22/2020   Attestation Statements:   Reviewed by clinician on day of visit: allergies, medications, problem list, medical history, surgical history, family history, social history, and previous encounter notes.  I, Water quality scientist, CMA, am acting as transcriptionist for [name], DO  I have reviewed the above documentation for accuracy and completeness, and I agree with the above. [name], DO

## 2020-09-04 ENCOUNTER — Encounter (INDEPENDENT_AMBULATORY_CARE_PROVIDER_SITE_OTHER): Payer: Self-pay | Admitting: Family Medicine

## 2020-09-04 ENCOUNTER — Ambulatory Visit (INDEPENDENT_AMBULATORY_CARE_PROVIDER_SITE_OTHER): Payer: Medicare Other | Admitting: Family Medicine

## 2020-09-04 ENCOUNTER — Other Ambulatory Visit: Payer: Self-pay

## 2020-09-04 VITALS — BP 136/73 | HR 57 | Temp 98.1°F | Ht 65.0 in | Wt 233.0 lb

## 2020-09-04 DIAGNOSIS — I10 Essential (primary) hypertension: Secondary | ICD-10-CM

## 2020-09-04 DIAGNOSIS — E782 Mixed hyperlipidemia: Secondary | ICD-10-CM | POA: Diagnosis not present

## 2020-09-04 DIAGNOSIS — K219 Gastro-esophageal reflux disease without esophagitis: Secondary | ICD-10-CM | POA: Diagnosis not present

## 2020-09-04 DIAGNOSIS — Z6838 Body mass index (BMI) 38.0-38.9, adult: Secondary | ICD-10-CM | POA: Diagnosis not present

## 2020-09-04 NOTE — Progress Notes (Signed)
Chief Complaint:   OBESITY Evelyn Campbell is here to discuss her progress with her obesity treatment plan along with follow-up of her obesity related diagnoses.   Today's visit was #: 6 Starting weight: 246 lbs Starting date: 06/05/2020 Today's weight: 233 lbs Today's date: 09/04/2020 Total lbs lost to date: 13 lbs Body mass index is 38.77 kg/m.  Total weight loss percentage to date: -5.28%  Interim History: Evelyn Campbell is doing well.  Still following the plan, though admits to food indiscretions last week.  Nutrition Plan: keeping a food journal and adhering to recommended goals of 1200 calories and 95 grams of protein for 95% of the time. Hunger is moderately controlled controlled. Cravings are moderately controlled controlled.  Activity: Biking for 40 minutes 7 times per week. Sleep: Sleep is restful.   Assessment/Plan:   1. Mixed hyperlipidemia Lipid-lowering medications: Omega-3. Plan: Dietary changes: Increase soluble fiber. Decrease simple carbohydrates. Exercise changes: An average 40 minutes of moderate to vigorous-intensity aerobic activity 3 or 4 times per week.   Lab Results  Component Value Date   ALT 33 (H) 05/22/2020   AST 31 05/22/2020   ALKPHOS 116 05/22/2020   BILITOT 0.4 05/22/2020   2. Essential hypertension At goal. Medications: Norvasc, atenolol. Plan: Avoid buying foods that are: processed, frozen, or prepackaged to avoid excess salt. We will continue to monitor symptoms as they relate to her weight loss journey.  BP Readings from Last 3 Encounters:  09/04/20 136/73  08/14/20 136/81  07/22/20 140/82   Lab Results  Component Value Date   CREATININE 0.78 05/22/2020   - Refill amLODipine (NORVASC) 5 MG tablet; Take 1 tablet (5 mg total) by mouth daily.  Dispense: 30 tablet; Refill: 0  3. Gastroesophageal reflux disease, unspecified whether esophagitis present Evelyn Campbell is taking omeprazole for GERD.  We reviewed the diagnosis of GERD and the reasons why  it was important to treat. We discussed "red flag" symptoms and the importance of follow up if symptoms persisted despite treatment. We reviewed non-pharmacologic management of GERD symptoms: including: caffeine reduction, dietary changes, elevate HOB, NPO after supper, reduction of alcohol intake, tobacco cessation, and weight loss.  4. Class 2 severe obesity with serious comorbidity and body mass index (BMI) of 38.0 to 38.9 in adult, unspecified obesity type (Evelyn Campbell)  Course: Evelyn Campbell is currently in the action stage of change. As such, her goal is to continue with weight loss efforts.   Nutrition goals: She has agreed to keeping a food journal and adhering to recommended goals of 1200 calories and 95 grams of protein.   Exercise goals: For substantial health benefits, adults should do at least 150 minutes (2 hours and 30 minutes) a week of moderate-intensity, or 75 minutes (1 hour and 15 minutes) a week of vigorous-intensity aerobic physical activity, or an equivalent combination of moderate- and vigorous-intensity aerobic activity. Aerobic activity should be performed in episodes of at least 10 minutes, and preferably, it should be spread throughout the week.  Behavioral modification strategies: increasing lean protein intake, decreasing simple carbohydrates, increasing vegetables and increasing water intake.  Evelyn Campbell has agreed to follow-up with our clinic in 3 weeks. She was informed of the importance of frequent follow-up visits to maximize her success with intensive lifestyle modifications for her multiple health conditions.   Objective:   Blood pressure 136/73, pulse (!) 57, temperature 98.1 F (36.7 C), temperature source Oral, height 5\' 5"  (1.651 m), weight 233 lb (105.7 kg), SpO2 98 %. Body mass index is 38.77  kg/m.  General: Cooperative, alert, well developed, in no acute distress. HEENT: Conjunctivae and lids unremarkable. Cardiovascular: Regular rhythm.  Lungs: Normal work of  breathing. Neurologic: No focal deficits.   Lab Results  Component Value Date   CREATININE 0.78 05/22/2020   BUN 19 05/22/2020   NA 138 05/22/2020   K 4.6 05/22/2020   CL 98 05/22/2020   CO2 22 05/22/2020   Lab Results  Component Value Date   ALT 33 (H) 05/22/2020   AST 31 05/22/2020   ALKPHOS 116 05/22/2020   BILITOT 0.4 05/22/2020   Lab Results  Component Value Date   HGBA1C 5.9 (H) 05/22/2020   Lab Results  Component Value Date   INSULIN 22.1 05/22/2020   Lab Results  Component Value Date   WBC 6.2 05/22/2020   HGB 14.6 05/22/2020   HCT 44.0 05/22/2020   MCV 85 05/22/2020   PLT 186 05/22/2020   Obesity Behavioral Intervention:   Approximately 15 minutes were spent on the discussion below.  ASK: We discussed the diagnosis of obesity with Evelyn Campbell today and Evelyn Campbell agreed to give [name] permission to discuss obesity behavioral modification therapy today.  ASSESS: Evelyn Campbell has the diagnosis of obesity and her BMI today is 38.8. Evelyn Campbell is in the action stage of change.   ADVISE: Evelyn Campbell was educated on the multiple health risks of obesity as well as the benefit of weight loss to improve her health. She was advised of the need for long term treatment and the importance of lifestyle modifications to improve her current health and to decrease her risk of future health problems.  AGREE: Multiple dietary modification options and treatment options were discussed and Evelyn Campbell agreed to follow the recommendations documented in the above note.  ARRANGE: Evelyn Campbell was educated on the importance of frequent visits to treat obesity as outlined per CMS and USPSTF guidelines and agreed to schedule her next follow up appointment today.  Attestation Statements:   Reviewed by clinician on day of visit: allergies, medications, problem list, medical history, surgical history, family history, social history, and previous encounter notes.  I, Water quality scientist, CMA, am acting as  transcriptionist for [name], DO  I have reviewed the above documentation for accuracy and completeness, and I agree with the above. [name], DO

## 2020-09-08 MED ORDER — AMLODIPINE BESYLATE 5 MG PO TABS: 5.0000 mg | ORAL_TABLET | Freq: Every day | ORAL | 0 refills | Status: DC

## 2020-09-22 ENCOUNTER — Ambulatory Visit (INDEPENDENT_AMBULATORY_CARE_PROVIDER_SITE_OTHER): Payer: Medicare Other | Admitting: Physical Therapy

## 2020-09-22 ENCOUNTER — Other Ambulatory Visit: Payer: Self-pay

## 2020-09-22 DIAGNOSIS — R2689 Other abnormalities of gait and mobility: Secondary | ICD-10-CM

## 2020-09-30 ENCOUNTER — Other Ambulatory Visit: Payer: Self-pay

## 2020-09-30 ENCOUNTER — Encounter: Payer: Self-pay | Admitting: Physical Therapy

## 2020-09-30 ENCOUNTER — Ambulatory Visit (INDEPENDENT_AMBULATORY_CARE_PROVIDER_SITE_OTHER): Payer: Medicare Other | Admitting: Physical Therapy

## 2020-09-30 DIAGNOSIS — R2689 Other abnormalities of gait and mobility: Secondary | ICD-10-CM

## 2020-10-01 ENCOUNTER — Encounter: Payer: Self-pay | Admitting: Physical Therapy

## 2020-10-01 ENCOUNTER — Ambulatory Visit (INDEPENDENT_AMBULATORY_CARE_PROVIDER_SITE_OTHER): Payer: Medicare Other | Admitting: Physical Therapy

## 2020-10-01 DIAGNOSIS — R2689 Other abnormalities of gait and mobility: Secondary | ICD-10-CM | POA: Diagnosis not present

## 2020-10-01 NOTE — Therapy (Signed)
[facility] [address] [place], [place], [address] Phone: [phone]   Fax:  [phone]  Physical Therapy Evaluation  Patient Details  Name: Evelyn Campbell MRN: [id number] Date of Birth: [birth year] Referring Provider (PT): [name]   Encounter Date: 09/22/2020   PT End of Session - 09/30/20 1543    Visit Number 1    Number of Visits 12    Date for PT Re-Evaluation 11/03/20    Authorization Type Medicare    PT Start Time 1300    PT Stop Time 1340    PT Time Calculation (min) 40 min    Activity Tolerance Patient tolerated treatment well    Behavior During Therapy [facility] for tasks assessed/performed           Past Medical History:  Diagnosis Date  . Arthritis    R ankle   . Back pain   . Chronic kidney disease    seen by Nephrology at [facility], due to all her numbers were"off", she  remarks that she was sent to superhydrate & then her labs values corrected.  Pt. will f/u /w nephrology  . Constipation   . Fatty liver   . Gallbladder problem   . GERD (gastroesophageal reflux disease)    occ  . Gout   . Hyperlipidemia   . Hypertension   . Joint pain   . Lower extremity edema   . Obesity   . Peripheral neuropathy   . Pneumonia 1980's   hx- not needed hospitalization   . Vitamin D deficiency     Past Surgical History:  Procedure Laterality Date  . ABDOMINAL HYSTERECTOMY  99  . ANKLE FUSION Right 6/15  . CHOLECYSTECTOMY  10  . DENTAL SURGERY     implant- upper R   . HERNIA REPAIR    . INCISIONAL HERNIA REPAIR N/A 07/02/2015   Procedure: LAPAROSCOPIC INCISIONAL HERNIA;  Surgeon: Coralie Keens, MD;  Location: [place];  Service: General;  Laterality: N/A;  . INGUINAL HERNIA REPAIR Bilateral 08/20/2014   Procedure: LAPAROSCOPIC BILATERAL INGUINAL HERNIA REPAIR WITH MESH;  Surgeon: Coralie Keens, MD;  Location: [place];  Service: General;  Laterality: Bilateral;  . INSERTION OF MESH N/A 08/20/2014    Procedure: INSERTION OF MESH;  Surgeon: Coralie Keens, MD;  Location: [place];  Service: General;  Laterality: N/A;  . INSERTION OF MESH N/A 07/02/2015   Procedure: INSERTION OF MESH;  Surgeon: Coralie Keens, MD;  Location: [place];  Service: General;  Laterality: N/A;  . PANCREATECTOMY N/A 08/20/2014   Procedure: LAPAROSCOPIC ADRENALECTOMY;  Surgeon: Stark Klein, MD;  Location: [place];  Service: General;  Laterality: N/A;  . TOE SURGERY Bilateral 2012   hammer toe  . TONSILLECTOMY    . UMBILICAL HERNIA REPAIR N/A 08/20/2014   Procedure: HERNIA REPAIR UMBILICAL ADULT;  Surgeon: Coralie Keens, MD;  Location: [place];  Service: General;  Laterality: N/A;  . [place]  . WRIST SURGERY      There were no vitals filed for this visit.    Subjective Assessment - 09/30/20 1540    Subjective Pt being seen at healthy weight clinic. States decreased confidence with her balance. states 1 fall/yr for last 5 years. Has mild R ankle pain, and mild knee pain. Wants to get back to exercising more, to help with weight loss.    Limitations Standing;Walking;House hold activities    Patient Stated Goals improve balance , increase exercise    Currently in Pain? No/denies  [facility] PT Assessment - 10/01/20 0001      Assessment   Medical Diagnosis Gait/balance    Referring Provider (PT) [name]    Prior Therapy no      Balance Screen   Has the patient fallen in the past 6 months No    Has the patient had a decrease in activity level because of a fear of falling?  No    Is the patient reluctant to leave their home because of a fear of falling?  No      Prior Function   Level of Independence Independent      Cognition   Overall Cognitive Status Within Functional Limits for tasks assessed      AROM   Overall AROM Comments LE: WNL, UE: WNL  Mild limitation for R ankle ROM.      Strength   Overall Strength Comments Hips: 4-/5: knees: 4/5;       Special Tests    Other special tests Dynamic balance:  good ;   static:  SLS: 4-5 sec bil ;  Tandem stance:  15-20 sec bil;                       Objective measurements completed on examination: See above findings.               PT Education - 09/30/20 1543    Education Details PT POC, exam findings,    Person(s) Educated Patient    Methods Explanation;Demonstration;Verbal cues    Comprehension Verbalized understanding;Returned demonstration;Verbal cues required            PT Short Term Goals - 09/30/20 1554      PT SHORT TERM GOAL #1   Title Pt to be independent with initial HEP    Time 2    Period Weeks    Status New    Target Date 10/06/20             PT Long Term Goals - 10/01/20 0954      PT LONG TERM GOAL #1   Title Pt to be independent with final HEP    Time 6    Period Weeks    Status New    Target Date 11/03/20      PT LONG TERM GOAL #2   Title Pt to demo improved strength of hips and knees to at least 4+/5 to improve stability and gait    Time 6    Period Weeks    Status New    Target Date 11/04/19      PT LONG TERM GOAL #3   Title Pt to demo dynamic balance to be WNL for pt age, to improve safety and risk for falls.    Time 6    Period Weeks    Status New    Target Date 11/04/19      PT LONG TERM GOAL #4   Title Pt to report ability for walking and exercise up to 30 min without pain or deficit, to improve ability for exercise and weight loss.    Time 6    Period Weeks    Status New    Target Date 11/04/19                  Plan - 10/01/20 0959    Clinical Impression Statement Pt presents with primary complaint of decreased balance and stability, and decreased ability for exercise. Pt with mild pain in Knee and  ankle, and decreased stability that is limiting her from weight loss and increased activity. Pt with mild balance deficits, with static and dynamic balance testing. She has had a few falls in the past 2 years. Pt is  deconditioned and has decreaesd strength in LEs and core. Pt to benefit from skilled PT to educate on approptiate exercise, return to exercise, as well as to improve balance and fall risk.    Examination-Activity Limitations Stand;Carry;Squat;Stairs    Examination-Participation Restrictions Cleaning;Yard Work;Community Activity;Shop    Stability/Clinical Decision Making Stable/Uncomplicated    Clinical Decision Making Low    Rehab Potential Good    PT Frequency 2x / week    PT Duration 6 weeks    PT Treatment/Interventions ADLs/Self Care Home Management;Canalith Repostioning;Cryotherapy;Electrical Stimulation;Ultrasound;Traction;Moist Heat;Iontophoresis 4mg /ml Dexamethasone;DME Instruction;Gait training;Stair training;Functional mobility training;Therapeutic activities;Orthotic Fit/Training;Therapeutic exercise;Patient/family education;Neuromuscular re-education;Balance training;Manual techniques;Vasopneumatic Device;Taping;Energy conservation;Dry needling;Passive range of motion;Spinal Manipulations;Joint Manipulations    Consulted and Agree with Plan of Care Patient           Patient will benefit from skilled therapeutic intervention in order to improve the following deficits and impairments:  Abnormal gait, Pain, Improper body mechanics, Decreased mobility, Increased muscle spasms, Decreased activity tolerance, Decreased endurance, Decreased range of motion, Decreased strength, Impaired flexibility, Difficulty walking, Decreased balance, Decreased safety awareness, Impaired perceived functional ability  Visit Diagnosis: Other abnormalities of gait and mobility     Problem List Patient Active Problem List   Diagnosis Date Noted  . Incisional hernia 07/02/2015  . 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 Essential hypertension, benign 06/14/2014  . Family history of ischemic heart disease 06/14/2014  . Obesity, unspecified 06/14/2014   Lyndee Hensen, PT, DPT 10:07 AM   10/01/20    [facility] [place], [place], [address] Phone: [phone]   Fax:  [phone]  Name: Evelyn Campbell MRN: [id number] Date of Birth: 1956/05/24

## 2020-10-01 NOTE — Therapy (Signed)
[name] [address] [place], [place], [address] Phone: [phone]   Fax:  [phone]  Physical Therapy Treatment  Patient Details  Name: Evelyn Campbell MRN: [id number] Date of Birth: [birth year] Referring Provider (PT): [name]   Encounter Date: 09/30/2020   PT End of Session - 10/01/20 1019    Visit Number 2    Number of Visits 12    Date for PT Re-Evaluation 11/03/20    Authorization Type Medicare    PT Start Time 0845    PT Stop Time 0930    PT Time Calculation (min) 45 min    Activity Tolerance Patient tolerated treatment well    Behavior During Therapy [facility] for tasks assessed/performed           Past Medical History:  Diagnosis Date  . Arthritis    R ankle   . Back pain   . Chronic kidney disease    seen by Nephrology at [facility], due to all her numbers were"off", she  remarks that she was sent to superhydrate & then her labs values corrected.  Pt. will f/u /w nephrology  . Constipation   . Fatty liver   . Gallbladder problem   . GERD (gastroesophageal reflux disease)    occ  . Gout   . Hyperlipidemia   . Hypertension   . Joint pain   . Lower extremity edema   . Obesity   . Peripheral neuropathy   . Pneumonia 1980's   hx- not needed hospitalization   . Vitamin D deficiency     Past Surgical History:  Procedure Laterality Date  . ABDOMINAL HYSTERECTOMY  99  . ANKLE FUSION Right 6/15  . CHOLECYSTECTOMY  10  . DENTAL SURGERY     implant- upper R   . HERNIA REPAIR    . INCISIONAL HERNIA REPAIR N/A 07/02/2015   Procedure: LAPAROSCOPIC INCISIONAL HERNIA;  Surgeon: Coralie Keens, MD;  Location: [place];  Service: General;  Laterality: N/A;  . INGUINAL HERNIA REPAIR Bilateral 08/20/2014   Procedure: LAPAROSCOPIC BILATERAL INGUINAL HERNIA REPAIR WITH MESH;  Surgeon: Coralie Keens, MD;  Location: [place];  Service: General;  Laterality: Bilateral;  . INSERTION OF MESH N/A 08/20/2014   Procedure:  INSERTION OF MESH;  Surgeon: Coralie Keens, MD;  Location: [place];  Service: General;  Laterality: N/A;  . INSERTION OF MESH N/A 07/02/2015   Procedure: INSERTION OF MESH;  Surgeon: Coralie Keens, MD;  Location: [place];  Service: General;  Laterality: N/A;  . PANCREATECTOMY N/A 08/20/2014   Procedure: LAPAROSCOPIC ADRENALECTOMY;  Surgeon: Stark Klein, MD;  Location: [place];  Service: General;  Laterality: N/A;  . TOE SURGERY Bilateral 2012   hammer toe  . TONSILLECTOMY    . UMBILICAL HERNIA REPAIR N/A 08/20/2014   Procedure: HERNIA REPAIR UMBILICAL ADULT;  Surgeon: Coralie Keens, MD;  Location: [place];  Service: General;  Laterality: N/A;  . [place]  . WRIST SURGERY      There were no vitals filed for this visit.   Subjective Assessment - 10/01/20 1019    Subjective Pt with no new complaints. Has been trying to ride exercise bike at home.    Currently in Pain? No/denies                             [facility] Adult PT Treatment/Exercise - 10/01/20 1022      Exercises   Exercises Knee/Hip  Knee/Hip Exercises: Aerobic   Recumbent Bike L1 x 8 min;       Knee/Hip Exercises: Standing   Heel Raises 20 reps    Hip Flexion 20 reps;Knee bent    Hip Abduction 20 reps;Both    Forward Step Up 10 reps;Both;Step Height: 6";Hand Hold: 1    Stairs up/down 5 steps 1 HR x 4;       Knee/Hip Exercises: Seated   Long Arc Quad 20 reps    Long Arc Quad Weight 3 lbs.      Knee/Hip Exercises: Supine   Straight Leg Raises 10 reps;Both                    PT Short Term Goals - 09/30/20 1554      PT SHORT TERM GOAL #1   Title Pt to be independent with initial HEP    Time 2    Period Weeks    Status New    Target Date 10/06/20             PT Long Term Goals - 10/01/20 0954      PT LONG TERM GOAL #1   Title Pt to be independent with final HEP    Time 6    Period Weeks    Status New    Target Date 11/03/20      PT LONG TERM GOAL #2    Title Pt to demo improved strength of hips and knees to at least 4+/5 to improve stability and gait    Time 6    Period Weeks    Status New    Target Date 11/04/19      PT LONG TERM GOAL #3   Title Pt to demo dynamic balance to be WNL for pt age, to improve safety and risk for falls.    Time 6    Period Weeks    Status New    Target Date 11/04/19      PT LONG TERM GOAL #4   Title Pt to report ability for walking and exercise up to 30 min without pain or deficit, to improve ability for exercise and weight loss.    Time 6    Period Weeks    Status New    Target Date 11/04/19                 Plan - 10/01/20 1020    Clinical Impression Statement Focus on ther ex education for HEP today. Plan to progress ther ex, strengthening as tolerated, and add balance exercises next visit.    Examination-Activity Limitations Stand;Carry;Squat;Stairs    Examination-Participation Restrictions Cleaning;Yard Work;Community Activity;Shop    Stability/Clinical Decision Making Stable/Uncomplicated    Rehab Potential Good    PT Frequency 2x / week    PT Duration 6 weeks    PT Treatment/Interventions ADLs/Self Care Home Management;Canalith Repostioning;Cryotherapy;Electrical Stimulation;Ultrasound;Traction;Moist Heat;Iontophoresis 4mg /ml Dexamethasone;DME Instruction;Gait training;Stair training;Functional mobility training;Therapeutic activities;Orthotic Fit/Training;Therapeutic exercise;Patient/family education;Neuromuscular re-education;Balance training;Manual techniques;Vasopneumatic Device;Taping;Energy conservation;Dry needling;Passive range of motion;Spinal Manipulations;Joint Manipulations    Consulted and Agree with Plan of Care Patient           Patient will benefit from skilled therapeutic intervention in order to improve the following deficits and impairments:  Abnormal gait, Pain, Improper body mechanics, Decreased mobility, Increased muscle spasms, Decreased activity tolerance,  Decreased endurance, Decreased range of motion, Decreased strength, Impaired flexibility, Difficulty walking, Decreased balance, Decreased safety awareness, Impaired perceived functional ability  Visit Diagnosis: Other abnormalities of gait and  mobility     Problem List Patient Active Problem List   Diagnosis Date Noted  . Incisional hernia 07/02/2015  . 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 Essential hypertension, benign 06/14/2014  . Family history of ischemic heart disease 06/14/2014  . Obesity, unspecified 06/14/2014    Lyndee Hensen, PT, DPT 10:24 AM  10/01/20    [facility] [place], [place], [address] Phone: [phone]   Fax:  [phone]  Name: Kamoria Lucien MRN: [id number] Date of Birth: Mar 06, 1956

## 2020-10-01 NOTE — Therapy (Signed)
[facility] [address] [place], [place], [address] Phone: [phone]   Fax:  [phone]  Physical Therapy Treatment  Patient Details  Name: Evelyn Campbell MRN: [id number] Date of Birth: 04-11-1956 Referring Provider (PT): [name]   Encounter Date: 10/01/2020   PT End of Session - 10/01/20 1530    Visit Number 3    Number of Visits 12    Date for PT Re-Evaluation 11/03/20    Authorization Type Medicare    PT Start Time 0762    PT Stop Time 1601    PT Time Calculation (min) 40 min    Activity Tolerance Patient tolerated treatment well    Behavior During Therapy [facility] for tasks assessed/performed           Past Medical History:  Diagnosis Date  . Arthritis    R ankle   . Back pain   . Chronic kidney disease    seen by Nephrology at [facility], due to all her numbers were"off", she  remarks that she was sent to superhydrate & then her labs values corrected.  Pt. will f/u /w nephrology  . Constipation   . Fatty liver   . Gallbladder problem   . GERD (gastroesophageal reflux disease)    occ  . Gout   . Hyperlipidemia   . Hypertension   . Joint pain   . Lower extremity edema   . Obesity   . Peripheral neuropathy   . Pneumonia 1980's   hx- not needed hospitalization   . Vitamin D deficiency     Past Surgical History:  Procedure Laterality Date  . ABDOMINAL HYSTERECTOMY  99  . ANKLE FUSION Right 6/15  . CHOLECYSTECTOMY  10  . DENTAL SURGERY     implant- upper R   . HERNIA REPAIR    . INCISIONAL HERNIA REPAIR N/A 07/02/2015   Procedure: LAPAROSCOPIC INCISIONAL HERNIA;  Surgeon: Coralie Keens, MD;  Location: [place];  Service: General;  Laterality: N/A;  . INGUINAL HERNIA REPAIR Bilateral 08/20/2014   Procedure: LAPAROSCOPIC BILATERAL INGUINAL HERNIA REPAIR WITH MESH;  Surgeon: Coralie Keens, MD;  Location: [place];  Service: General;  Laterality: Bilateral;  . INSERTION OF MESH N/A 08/20/2014   Procedure:  INSERTION OF MESH;  Surgeon: Coralie Keens, MD;  Location: [place];  Service: General;  Laterality: N/A;  . INSERTION OF MESH N/A 07/02/2015   Procedure: INSERTION OF MESH;  Surgeon: Coralie Keens, MD;  Location: [place];  Service: General;  Laterality: N/A;  . PANCREATECTOMY N/A 08/20/2014   Procedure: LAPAROSCOPIC ADRENALECTOMY;  Surgeon: Stark Klein, MD;  Location: [place];  Service: General;  Laterality: N/A;  . TOE SURGERY Bilateral 2012   hammer toe  . TONSILLECTOMY    . UMBILICAL HERNIA REPAIR N/A 08/20/2014   Procedure: HERNIA REPAIR UMBILICAL ADULT;  Surgeon: Coralie Keens, MD;  Location: [place];  Service: General;  Laterality: N/A;  . [place]  . WRIST SURGERY      There were no vitals filed for this visit.   Subjective Assessment - 10/01/20 1529    Subjective Pt states mild improvements in stair climbing with knee pain.    Currently in Pain? No/denies                             [facility] Adult PT Treatment/Exercise - 10/01/20 1550      Exercises   Exercises Knee/Hip  Knee/Hip Exercises: Aerobic   Recumbent Bike L2 x 8 min;       Knee/Hip Exercises: Standing   Heel Raises --    Hip Flexion --    Hip Abduction --    Forward Step Up 10 reps;Both;Step Height: 6";Hand Hold: 1    Stairs up/down 5 steps 1 HR x 4;     Other Standing Knee Exercises Step ups onto AirEx x 10 bil;  Step downs from airex/Lateral x10 bil;  DLS AirEx headturns x 15;      Other Standing Knee Exercises Tandem stance 30 sec 3; SLS 10 sec x 3 bil;       Knee/Hip Exercises: Seated   Long Arc Quad 20 reps    Long Arc Quad Weight 3 lbs.      Knee/Hip Exercises: Supine   Straight Leg Raises 10 reps;Both      Knee/Hip Exercises: Sidelying   Hip ABduction 10 reps                    PT Short Term Goals - 09/30/20 1554      PT SHORT TERM GOAL #1   Title Pt to be independent with initial HEP    Time 2    Period Weeks    Status New    Target Date  10/06/20             PT Long Term Goals - 10/01/20 0954      PT LONG TERM GOAL #1   Title Pt to be independent with final HEP    Time 6    Period Weeks    Status New    Target Date 11/03/20      PT LONG TERM GOAL #2   Title Pt to demo improved strength of hips and knees to at least 4+/5 to improve stability and gait    Time 6    Period Weeks    Status New    Target Date 11/04/19      PT LONG TERM GOAL #3   Title Pt to demo dynamic balance to be WNL for pt age, to improve safety and risk for falls.    Time 6    Period Weeks    Status New    Target Date 11/04/19      PT LONG TERM GOAL #4   Title Pt to report ability for walking and exercise up to 30 min without pain or deficit, to improve ability for exercise and weight loss.    Time 6    Period Weeks    Status New    Target Date 11/04/19                 Plan - 10/01/20 1558    Clinical Impression Statement Focus on balance and stability exercises today, pt challenged with all activities. Reviewed HEP for LE strength. Plan to progress stability as able.    Examination-Activity Limitations Stand;Carry;Squat;Stairs    Examination-Participation Restrictions Cleaning;Yard Work;Community Activity;Shop    Stability/Clinical Decision Making Stable/Uncomplicated    Rehab Potential Good    PT Frequency 2x / week    PT Duration 6 weeks    PT Treatment/Interventions ADLs/Self Care Home Management;Canalith Repostioning;Cryotherapy;Electrical Stimulation;Ultrasound;Traction;Moist Heat;Iontophoresis 4mg /ml Dexamethasone;DME Instruction;Gait training;Stair training;Functional mobility training;Therapeutic activities;Orthotic Fit/Training;Therapeutic exercise;Patient/family education;Neuromuscular re-education;Balance training;Manual techniques;Vasopneumatic Device;Taping;Energy conservation;Dry needling;Passive range of motion;Spinal Manipulations;Joint Manipulations    Consulted and Agree with Plan of Care Patient            Patient  will benefit from skilled therapeutic intervention in order to improve the following deficits and impairments:  Abnormal gait, Pain, Improper body mechanics, Decreased mobility, Increased muscle spasms, Decreased activity tolerance, Decreased endurance, Decreased range of motion, Decreased strength, Impaired flexibility, Difficulty walking, Decreased balance, Decreased safety awareness, Impaired perceived functional ability  Visit Diagnosis: Other abnormalities of gait and mobility     Problem List Patient Active Problem List   Diagnosis Date Noted  . Incisional hernia 07/02/2015  . 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 Essential hypertension, benign 06/14/2014  . Family history of ischemic heart disease 06/14/2014  . Obesity, unspecified 06/14/2014    Lyndee Hensen, PT, DPT 4:02 PM  10/01/20    [facility] [place], [place], [address] Phone: [phone]   Fax:  [phone]  Name: Evelyn Campbell MRN: [id number] Date of Birth: [birth year]

## 2020-10-01 NOTE — Patient Instructions (Signed)
Access Code: [email or web address] URL: [email or web address] Date: 10/01/2020 Prepared by: Lyndee Hensen  Exercises Standing March with Counter Support - 2 x daily - 2 sets - 10 reps Standing Hip Abduction with Counter Support - 1 x daily - 2 sets - 10 reps Heel rises with counter support - 1 x daily - 2 sets - 10 reps Seated Knee Extension AROM - 1-2 x daily - 2 sets - 10 reps Straight Leg Raise - 1 x daily - 1-2 sets - 10 reps

## 2020-10-02 ENCOUNTER — Ambulatory Visit (INDEPENDENT_AMBULATORY_CARE_PROVIDER_SITE_OTHER): Payer: Medicare Other | Admitting: Family Medicine

## 2020-10-02 ENCOUNTER — Other Ambulatory Visit: Payer: Self-pay

## 2020-10-02 ENCOUNTER — Encounter (INDEPENDENT_AMBULATORY_CARE_PROVIDER_SITE_OTHER): Payer: Self-pay | Admitting: Family Medicine

## 2020-10-02 VITALS — BP 132/86 | HR 61 | Temp 97.7°F | Ht 65.0 in | Wt 234.0 lb

## 2020-10-02 DIAGNOSIS — E8881 Metabolic syndrome: Secondary | ICD-10-CM

## 2020-10-02 DIAGNOSIS — E66812 Obesity, class 2: Secondary | ICD-10-CM

## 2020-10-02 DIAGNOSIS — E559 Vitamin D deficiency, unspecified: Secondary | ICD-10-CM

## 2020-10-02 DIAGNOSIS — R7303 Prediabetes: Secondary | ICD-10-CM

## 2020-10-02 DIAGNOSIS — Z6839 Body mass index (BMI) 39.0-39.9, adult: Secondary | ICD-10-CM | POA: Diagnosis not present

## 2020-10-07 ENCOUNTER — Other Ambulatory Visit: Payer: Self-pay

## 2020-10-07 ENCOUNTER — Ambulatory Visit (INDEPENDENT_AMBULATORY_CARE_PROVIDER_SITE_OTHER): Payer: Medicare Other | Admitting: Physical Therapy

## 2020-10-07 DIAGNOSIS — R2689 Other abnormalities of gait and mobility: Secondary | ICD-10-CM

## 2020-10-07 NOTE — Progress Notes (Signed)
Chief Complaint:   OBESITY Evelyn Campbell is here to discuss her progress with her obesity treatment plan along with follow-up of her obesity related diagnoses.   Today's visit was #: 7 Starting weight: 246 lbs Starting date: 06/05/2020 Today's weight: 234 lbs Today's date: 10/02/2020 Total lbs lost to date: 12 lbs Body mass index is 38.94 kg/m.  Total weight loss percentage to date: -4.88%  Interim History: Evelyn Campbell says her NSV ia wearing jeans!  She enjoyed her holiday trip. Nutrition Plan: keeping a food journal and adhering to recommended goals of 1200 calories and 95 grams of protein.  Activity: Cardio/walking/PT for 3-4 times per week.  Assessment/Plan:   1. Prediabetes She will continue to focus on protein-rich, low simple carbohydrate foods. We reviewed the importance of hydration, regular exercise for stress reduction, and restorative sleep.   Lab Results  Component Value Date   HGBA1C 5.9 (H) 05/22/2020   Lab Results  Component Value Date   INSULIN 22.1 05/22/2020   2. Vitamin D deficiency Not at goal. Current vitamin D is 29.3, tested on 05/22/2020. Optimal goal > 50 ng/dL.   Plan:  []   Continue Vitamin D @50 ,000 IU every week. [x]   Continue home supplement of 2,000 IU daily. [x]   Follow-up for routine testing of Vitamin D at least 2-3 times per year to avoid over-replacement.  3. Metabolic syndrome Starting goal: Lose 7-10% of starting weight. She will continue to focus on protein-rich, low simple carbohydrate foods. We reviewed the importance of hydration, regular exercise for stress reduction, and restorative sleep.  We will continue to check lab work every 3 months, with 10% weight loss, or should any other concerns arise.  Visceral fat rating is 17.  4. Class 2 severe obesity with serious comorbidity and body mass index (BMI) of 39.0 to 39.9 in adult, unspecified obesity type ([place])  Course: [name] is currently in the action stage of change. As such, her  goal is to continue with weight loss efforts.   Nutrition goals: She has agreed to keeping a food journal and adhering to recommended goals of 1200 calories and 95 grams of protein.   Exercise goals: For substantial health benefits, adults should do at least 150 minutes (2 hours and 30 minutes) a week of moderate-intensity, or 75 minutes (1 hour and 15 minutes) a week of vigorous-intensity aerobic physical activity, or an equivalent combination of moderate- and vigorous-intensity aerobic activity. Aerobic activity should be performed in episodes of at least 10 minutes, and preferably, it should be spread throughout the week.  Behavioral modification strategies: increasing lean protein intake, decreasing simple carbohydrates, increasing vegetables, increasing water intake and decreasing liquid calories.  Evelyn Campbell has agreed to follow-up with our clinic in 3 weeks. She was informed of the importance of frequent follow-up visits to maximize her success with intensive lifestyle modifications for her multiple health conditions.   Objective:   Blood pressure 132/86, pulse 61, temperature 97.7 F (36.5 C), temperature source Oral, height 5\' 5"  (1.651 m), weight 234 lb (106.1 kg), SpO2 93 %. Body mass index is 38.94 kg/m.  General: Cooperative, alert, well developed, in no acute distress. HEENT: Conjunctivae and lids unremarkable. Cardiovascular: Regular rhythm.  Lungs: Normal work of breathing. Neurologic: No focal deficits.   Lab Results  Component Value Date   CREATININE 0.78 05/22/2020   BUN 19 05/22/2020   NA 138 05/22/2020   K 4.6 05/22/2020   CL 98 05/22/2020   CO2 22 05/22/2020   Lab Results  Component Value Date   ALT 33 (H) 05/22/2020   AST 31 05/22/2020   ALKPHOS 116 05/22/2020   BILITOT 0.4 05/22/2020   Lab Results  Component Value Date   HGBA1C 5.9 (H) 05/22/2020   Lab Results  Component Value Date   INSULIN 22.1 05/22/2020   Lab Results  Component Value Date    WBC 6.2 05/22/2020   HGB 14.6 05/22/2020   HCT 44.0 05/22/2020   MCV 85 05/22/2020   PLT 186 05/22/2020   Obesity Behavioral Intervention:   Approximately 15 minutes were spent on the discussion below.  ASK: We discussed the diagnosis of obesity with Evelyn Campbell today and Evelyn Campbell agreed to give Evelyn Campbell permission to discuss obesity behavioral modification therapy today.  ASSESS: Evelyn Campbell has the diagnosis of obesity and her BMI today is 39.0. Evelyn Campbell is in the action stage of change.   ADVISE: Evelyn Campbell was educated on the multiple health risks of obesity as well as the benefit of weight loss to improve her health. She was advised of the need for long term treatment and the importance of lifestyle modifications to improve her current health and to decrease her risk of future health problems.  AGREE: Multiple dietary modification options and treatment options were discussed and Evelyn Campbell agreed to follow the recommendations documented in the above note.  ARRANGE: Evelyn Campbell was educated on the importance of frequent visits to treat obesity as outlined per CMS and USPSTF guidelines and agreed to schedule her next follow up appointment today.  Attestation Statements:   Reviewed by clinician on day of visit: allergies, medications, problem list, medical history, surgical history, family history, social history, and previous encounter notes.  I, Water quality scientist, CMA, am acting as transcriptionist for Evelyn Deutscher, DO  I have reviewed the above documentation for accuracy and completeness, and I agree with the above. Evelyn Deutscher, DO

## 2020-10-08 ENCOUNTER — Encounter: Payer: Self-pay | Admitting: Physical Therapy

## 2020-10-08 NOTE — Therapy (Signed)
[facility] [address] [place], [place], [address] Phone: [phone]   Fax:  [phone]  Physical Therapy Treatment  Patient Details  Name: Evelyn Campbell MRN: [id number] Date of Birth: November 12, 1955 Referring Provider (PT): [name]   Encounter Date: 10/07/2020   PT End of Session - 10/08/20 2248    Visit Number 4    Number of Visits 12    Date for PT Re-Evaluation 11/03/20    Authorization Type Medicare    PT Start Time 1638    PT Stop Time 1600    PT Time Calculation (min) 30 min    Activity Tolerance Patient tolerated treatment well    Behavior During Therapy [facility] for tasks assessed/performed           Past Medical History:  Diagnosis Date  . Arthritis    R ankle   . Back pain   . Chronic kidney disease    seen by Nephrology at [facility], due to all her numbers were"off", she  remarks that she was sent to superhydrate & then her labs values corrected.  Pt. will f/u /w nephrology  . Constipation   . Fatty liver   . Gallbladder problem   . GERD (gastroesophageal reflux disease)    occ  . Gout   . Hyperlipidemia   . Hypertension   . Joint pain   . Lower extremity edema   . Obesity   . Peripheral neuropathy   . Pneumonia 1980's   hx- not needed hospitalization   . Vitamin D deficiency     Past Surgical History:  Procedure Laterality Date  . ABDOMINAL HYSTERECTOMY  99  . ANKLE FUSION Right 6/15  . CHOLECYSTECTOMY  10  . DENTAL SURGERY     implant- upper R   . HERNIA REPAIR    . INCISIONAL HERNIA REPAIR N/A 07/02/2015   Procedure: LAPAROSCOPIC INCISIONAL HERNIA;  Surgeon: Coralie Keens, MD;  Location: [place];  Service: General;  Laterality: N/A;  . INGUINAL HERNIA REPAIR Bilateral 08/20/2014   Procedure: LAPAROSCOPIC BILATERAL INGUINAL HERNIA REPAIR WITH MESH;  Surgeon: Coralie Keens, MD;  Location: [place];  Service: General;  Laterality: Bilateral;  . INSERTION OF MESH N/A 08/20/2014   Procedure:  INSERTION OF MESH;  Surgeon: Coralie Keens, MD;  Location: [place];  Service: General;  Laterality: N/A;  . INSERTION OF MESH N/A 07/02/2015   Procedure: INSERTION OF MESH;  Surgeon: Coralie Keens, MD;  Location: [place];  Service: General;  Laterality: N/A;  . PANCREATECTOMY N/A 08/20/2014   Procedure: LAPAROSCOPIC ADRENALECTOMY;  Surgeon: Stark Klein, MD;  Location: [place];  Service: General;  Laterality: N/A;  . TOE SURGERY Bilateral 2012   hammer toe  . TONSILLECTOMY    . UMBILICAL HERNIA REPAIR N/A 08/20/2014   Procedure: HERNIA REPAIR UMBILICAL ADULT;  Surgeon: Coralie Keens, MD;  Location: [place];  Service: General;  Laterality: N/A;  . [place]  . WRIST SURGERY      There were no vitals filed for this visit.   Subjective Assessment - 10/08/20 2247    Subjective Pt states she feels more confident with knee and stairs. Has been trying to increase activity, did increase resistance on bike at home.    Currently in Pain? No/denies                             [facility] Adult PT Treatment/Exercise - 10/07/20 1536  Exercises   Exercises Knee/Hip      Knee/Hip Exercises: Aerobic   Recumbent Bike L2 x 8 min;       Knee/Hip Exercises: Standing   Hip Flexion 20 reps;Knee bent    Hip Abduction 20 reps;Both    Forward Step Up 10 reps;Both;Step Height: 6";Hand Hold: 1    Stairs up/down 5 steps 1 HR x 4;     Other Standing Knee Exercises Step ups onto AirEx x 10 bil;  Step downs from airex/Lateral x10 bil;      Other Standing Knee Exercises Tandem stance 30 sec 3; SLS 10 sec x 3 bil; Walk/march 10 ft x 4;  Mini squat /walk 20 ft x 4;  bwd walk 20 ft x 4;       Knee/Hip Exercises: Seated   Long Arc Quad 20 reps    Long Arc Quad Weight 3 lbs.      Knee/Hip Exercises: Supine   Straight Leg Raises --      Knee/Hip Exercises: Sidelying   Hip ABduction 10 reps                  PT Education - 10/08/20 2248    Education Details HEP  reviewed    Person(s) Educated Patient    Methods Explanation;Demonstration;Verbal cues;Handout    Comprehension Verbalized understanding;Returned demonstration;Verbal cues required;Need further instruction            PT Short Term Goals - 09/30/20 1554      PT SHORT TERM GOAL #1   Title Pt to be independent with initial HEP    Time 2    Period Weeks    Status New    Target Date 10/06/20             PT Long Term Goals - 10/01/20 0954      PT LONG TERM GOAL #1   Title Pt to be independent with final HEP    Time 6    Period Weeks    Status New    Target Date 11/03/20      PT LONG TERM GOAL #2   Title Pt to demo improved strength of hips and knees to at least 4+/5 to improve stability and gait    Time 6    Period Weeks    Status New    Target Date 11/04/19      PT LONG TERM GOAL #3   Title Pt to demo dynamic balance to be WNL for pt age, to improve safety and risk for falls.    Time 6    Period Weeks    Status New    Target Date 11/04/19      PT LONG TERM GOAL #4   Title Pt to report ability for walking and exercise up to 30 min without pain or deficit, to improve ability for exercise and weight loss.    Time 6    Period Weeks    Status New    Target Date 11/04/19                 Plan - 10/08/20 2252    Clinical Impression Statement Ther ex and NMR progressed for LE strength, and dynamic balance. Pt challenged with stability exercises. Reinforced TA and core stability with balance. Pt with improving ability and confidence on stairs.    Examination-Activity Limitations Stand;Carry;Squat;Stairs    Examination-Participation Restrictions Cleaning;Yard Work;Community Activity;Shop    Stability/Clinical Decision Making Stable/Uncomplicated    Rehab Potential Good  PT Frequency 2x / week    PT Duration 6 weeks    PT Treatment/Interventions ADLs/Self Care Home Management;Canalith Repostioning;Cryotherapy;Electrical Stimulation;Ultrasound;Traction;Moist  Heat;Iontophoresis 4mg /ml Dexamethasone;DME Instruction;Gait training;Stair training;Functional mobility training;Therapeutic activities;Orthotic Fit/Training;Therapeutic exercise;Patient/family education;Neuromuscular re-education;Balance training;Manual techniques;Vasopneumatic Device;Taping;Energy conservation;Dry needling;Passive range of motion;Spinal Manipulations;Joint Manipulations    Consulted and Agree with Plan of Care Patient           Patient will benefit from skilled therapeutic intervention in order to improve the following deficits and impairments:  Abnormal gait, Pain, Improper body mechanics, Decreased mobility, Increased muscle spasms, Decreased activity tolerance, Decreased endurance, Decreased range of motion, Decreased strength, Impaired flexibility, Difficulty walking, Decreased balance, Decreased safety awareness, Impaired perceived functional ability  Visit Diagnosis: Other abnormalities of gait and mobility     Problem List Patient Active Problem List   Diagnosis Date Noted  . Incisional hernia 07/02/2015  . 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 Essential hypertension, benign 06/14/2014  . Family history of ischemic heart disease 06/14/2014  . Obesity, unspecified 06/14/2014    Lyndee Hensen, PT, DPT 10:54 PM  10/08/20    [place] [place], [place], [address] Phone: [phone]   Fax:  [phone]  Name: Evelyn Campbell MRN: [id number] Date of Birth: 23-Feb-1956

## 2020-10-09 ENCOUNTER — Ambulatory Visit (INDEPENDENT_AMBULATORY_CARE_PROVIDER_SITE_OTHER): Payer: Medicare Other | Admitting: Physical Therapy

## 2020-10-09 DIAGNOSIS — R2689 Other abnormalities of gait and mobility: Secondary | ICD-10-CM

## 2020-10-12 ENCOUNTER — Encounter: Payer: Self-pay | Admitting: Physical Therapy

## 2020-10-12 NOTE — Therapy (Signed)
[facility] [address] [place], [place], [address] Phone: [phone]   Fax:  [phone]  Physical Therapy Treatment  Patient Details  Name: Evelyn Campbell MRN: [id number] Date of Birth: Dec 04, 1955 Referring Provider (PT): [name]   Encounter Date: 10/09/2020   PT End of Session - 10/12/20 2218    Visit Number 5    Number of Visits 12    Date for PT Re-Evaluation 11/03/20    Authorization Type Medicare    PT Start Time 8003    PT Stop Time 1601    PT Time Calculation (min) 45 min    Activity Tolerance Patient tolerated treatment well    Behavior During Therapy [facility] for tasks assessed/performed           Past Medical History:  Diagnosis Date  . Arthritis    R ankle   . Back pain   . Chronic kidney disease    seen by Nephrology at [facility], due to all her numbers were"off", she  remarks that she was sent to superhydrate & then her labs values corrected.  Pt. will f/u /w nephrology  . Constipation   . Fatty liver   . Gallbladder problem   . GERD (gastroesophageal reflux disease)    occ  . Gout   . Hyperlipidemia   . Hypertension   . Joint pain   . Lower extremity edema   . Obesity   . Peripheral neuropathy   . Pneumonia 1980's   hx- not needed hospitalization   . Vitamin D deficiency     Past Surgical History:  Procedure Laterality Date  . ABDOMINAL HYSTERECTOMY  99  . ANKLE FUSION Right 6/15  . CHOLECYSTECTOMY  10  . DENTAL SURGERY     implant- upper R   . HERNIA REPAIR    . INCISIONAL HERNIA REPAIR N/A 07/02/2015   Procedure: LAPAROSCOPIC INCISIONAL HERNIA;  Surgeon: Coralie Keens, MD;  Location: [place];  Service: General;  Laterality: N/A;  . INGUINAL HERNIA REPAIR Bilateral 08/20/2014   Procedure: LAPAROSCOPIC BILATERAL INGUINAL HERNIA REPAIR WITH MESH;  Surgeon: Coralie Keens, MD;  Location: [place];  Service: General;  Laterality: Bilateral;  . INSERTION OF MESH N/A 08/20/2014   Procedure:  INSERTION OF MESH;  Surgeon: Coralie Keens, MD;  Location: [place];  Service: General;  Laterality: N/A;  . INSERTION OF MESH N/A 07/02/2015   Procedure: INSERTION OF MESH;  Surgeon: Coralie Keens, MD;  Location: [place];  Service: General;  Laterality: N/A;  . PANCREATECTOMY N/A 08/20/2014   Procedure: LAPAROSCOPIC ADRENALECTOMY;  Surgeon: Stark Klein, MD;  Location: [place];  Service: General;  Laterality: N/A;  . TOE SURGERY Bilateral 2012   hammer toe  . TONSILLECTOMY    . UMBILICAL HERNIA REPAIR N/A 08/20/2014   Procedure: HERNIA REPAIR UMBILICAL ADULT;  Surgeon: Coralie Keens, MD;  Location: [place];  Service: General;  Laterality: N/A;  . [place]  . WRIST SURGERY      There were no vitals filed for this visit.   Subjective Assessment - 10/12/20 2218    Subjective Pt with no new complaints.    Currently in Pain? No/denies                             [facility] Adult PT Treatment/Exercise - 10/12/20 0001      Exercises   Exercises Knee/Hip      Knee/Hip Exercises: Aerobic  Recumbent Bike L2 x 8 min;       Knee/Hip Exercises: Standing   Hip Flexion 20 reps;Knee bent    Hip Abduction 20 reps;Both    Forward Step Up 10 reps;Both;Step Height: 6";Hand Hold: 1    Forward Step Up Limitations With opp hip flex/march    Stairs up/down 5 steps 1 HR x 4;     Other Standing Knee Exercises Step ups onto AirEx x 10 bil;  Step downs from airex/Lateral x10 bil;      Other Standing Knee Exercises Tandem stance 30 sec 3; SLS 10 sec x 3 bil; Walk/march 10 ft x 4;  Mini squat /walk 20 ft x 4;  bwd walk 20 ft x 4;       Knee/Hip Exercises: Seated   Long Arc Quad 20 reps    Long Arc Quad Weight 3 lbs.      Knee/Hip Exercises: Supine   Straight Leg Raises 10 reps;Both    Straight Leg Raises Limitations with TA      Knee/Hip Exercises: Sidelying   Hip ABduction 15 reps;Both                    PT Short Term Goals - 09/30/20 1554      PT SHORT  TERM GOAL #1   Title Pt to be independent with initial HEP    Time 2    Period Weeks    Status New    Target Date 10/06/20             PT Long Term Goals - 10/01/20 0954      PT LONG TERM GOAL #1   Title Pt to be independent with final HEP    Time 6    Period Weeks    Status New    Target Date 11/03/20      PT LONG TERM GOAL #2   Title Pt to demo improved strength of hips and knees to at least 4+/5 to improve stability and gait    Time 6    Period Weeks    Status New    Target Date 11/04/19      PT LONG TERM GOAL #3   Title Pt to demo dynamic balance to be WNL for pt age, to improve safety and risk for falls.    Time 6    Period Weeks    Status New    Target Date 11/04/19      PT LONG TERM GOAL #4   Title Pt to report ability for walking and exercise up to 30 min without pain or deficit, to improve ability for exercise and weight loss.    Time 6    Period Weeks    Status New    Target Date 11/04/19                 Plan - 10/12/20 2219    Clinical Impression Statement Pt cued for TA contraction with most standing exercises today, for increased stability. Pt improving with strength ther ex, will benefit from continued care.    Examination-Activity Limitations Stand;Carry;Squat;Stairs    Examination-Participation Restrictions Cleaning;Yard Work;Community Activity;Shop    Stability/Clinical Decision Making Stable/Uncomplicated    Rehab Potential Good    PT Frequency 2x / week    PT Duration 6 weeks    PT Treatment/Interventions ADLs/Self Care Home Management;Canalith Repostioning;Cryotherapy;Electrical Stimulation;Ultrasound;Traction;Moist Heat;Iontophoresis 4mg /ml Dexamethasone;DME Instruction;Gait training;Stair training;Functional mobility training;Therapeutic activities;Orthotic Fit/Training;Therapeutic exercise;Patient/family education;Neuromuscular re-education;Balance training;Manual techniques;Vasopneumatic Device;Taping;Energy conservation;Dry  needling;Passive  range of motion;Spinal Manipulations;Joint Manipulations    Consulted and Agree with Plan of Care Patient           Patient will benefit from skilled therapeutic intervention in order to improve the following deficits and impairments:  Abnormal gait,Pain,Improper body mechanics,Decreased mobility,Increased muscle spasms,Decreased activity tolerance,Decreased endurance,Decreased range of motion,Decreased strength,Impaired flexibility,Difficulty walking,Decreased balance,Decreased safety awareness,Impaired perceived functional ability  Visit Diagnosis: Other abnormalities of gait and mobility     Problem List Patient Active Problem List   Diagnosis Date Noted  . Incisional hernia 07/02/2015  . 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 Essential hypertension, benign 06/14/2014  . Family history of ischemic heart disease 06/14/2014  . Obesity, unspecified 06/14/2014    Lyndee Hensen, PT, DPT 10:22 PM  10/12/20   [facility] [place], [place], [address] Phone: [phone]   Fax:  [phone]  Name: Evelyn Campbell MRN: [id number] Date of Birth: Mar 06, 1956

## 2020-10-14 ENCOUNTER — Other Ambulatory Visit: Payer: Self-pay

## 2020-10-14 ENCOUNTER — Ambulatory Visit (INDEPENDENT_AMBULATORY_CARE_PROVIDER_SITE_OTHER): Payer: Medicare Other | Admitting: Physical Therapy

## 2020-10-14 ENCOUNTER — Encounter: Payer: Self-pay | Admitting: Physical Therapy

## 2020-10-14 DIAGNOSIS — R2689 Other abnormalities of gait and mobility: Secondary | ICD-10-CM

## 2020-10-14 NOTE — Therapy (Signed)
[facility] [address] [place], [place], [address] Phone: [phone]   Fax:  [phone]  Physical Therapy Treatment  Patient Details  Name: Evelyn Campbell MRN: [id number] Date of Birth: Jun 24, 1956 Referring Provider (PT): [name]   Encounter Date: 10/14/2020   PT End of Session - 10/14/20 1029    Visit Number 6    Number of Visits 12    Date for PT Re-Evaluation 11/03/20    Authorization Type Medicare    PT Start Time 1020    PT Stop Time 1100    PT Time Calculation (min) 40 min    Activity Tolerance Patient tolerated treatment well    Behavior During Therapy [facility] for tasks assessed/performed           Past Medical History:  Diagnosis Date   Arthritis    R ankle    Back pain    Chronic kidney disease    seen by Nephrology at [facility], due to all her numbers were"off", she  remarks that she was sent to superhydrate & then her labs values corrected.  Pt. will f/u /w nephrology   Constipation    Fatty liver    Gallbladder problem    GERD (gastroesophageal reflux disease)    occ   Gout    Hyperlipidemia    Hypertension    Joint pain    Lower extremity edema    Obesity    Peripheral neuropathy    Pneumonia 1980's   hx- not needed hospitalization    Vitamin D deficiency     Past Surgical History:  Procedure Laterality Date   ABDOMINAL HYSTERECTOMY  99   ANKLE FUSION Right 6/15   CHOLECYSTECTOMY  10   DENTAL SURGERY     implant- upper R    HERNIA REPAIR     INCISIONAL HERNIA REPAIR N/A 07/02/2015   Procedure: LAPAROSCOPIC INCISIONAL HERNIA;  Surgeon: Coralie Keens, MD;  Location: [facility];  Service: General;  Laterality: N/A;   INGUINAL HERNIA REPAIR Bilateral 08/20/2014   Procedure: LAPAROSCOPIC BILATERAL INGUINAL HERNIA REPAIR WITH MESH;  Surgeon: Coralie Keens, MD;  Location: [place];  Service: General;  Laterality: Bilateral;   INSERTION OF MESH N/A 08/20/2014   Procedure:  INSERTION OF MESH;  Surgeon: Coralie Keens, MD;  Location: [place];  Service: General;  Laterality: N/A;   INSERTION OF MESH N/A 07/02/2015   Procedure: INSERTION OF MESH;  Surgeon: Coralie Keens, MD;  Location: [place];  Service: General;  Laterality: N/A;   PANCREATECTOMY N/A 08/20/2014   Procedure: LAPAROSCOPIC ADRENALECTOMY;  Surgeon: Stark Klein, MD;  Location: [employer];  Service: General;  Laterality: N/A;   TOE SURGERY Bilateral 2012   hammer toe   TONSILLECTOMY     UMBILICAL HERNIA REPAIR N/A 08/20/2014   Procedure: HERNIA REPAIR UMBILICAL ADULT;  Surgeon: Coralie Keens, MD;  Location: [place];  Service: General;  Laterality: N/A;   VAGINAL DELIVERY  1994   WRIST SURGERY      There were no vitals filed for this visit.   Subjective Assessment - 10/14/20 1029    Subjective Pt with no new complaints.    Currently in Pain? No/denies                             [facility] Adult PT Treatment/Exercise - 10/14/20 0001      Exercises   Exercises Knee/Hip      Knee/Hip Exercises: Aerobic  Recumbent Bike L2 x 8 min;       Knee/Hip Exercises: Standing   Hip Flexion 20 reps;Knee bent    Hip Abduction 20 reps;Both    Forward Step Up 10 reps;Both;Step Height: 6";Hand Hold: 1    Forward Step Up Limitations With opp hip flex/march    Stairs up/down 5 steps 1 HR x 4;     Other Standing Knee Exercises ;  Step downs from airex/Lateral x10 bil;    Other Standing Knee Exercises Tandem stance 30 sec 3; SLS 10 sec x 3 bil; Walk/march 10 ft x 4;      Knee/Hip Exercises: Seated   Long Arc Quad 20 reps    Long Arc Quad Weight 3 lbs.      Knee/Hip Exercises: Supine   Straight Leg Raises --    Straight Leg Raises Limitations --      Knee/Hip Exercises: Sidelying   Hip ABduction --                    PT Short Term Goals - 09/30/20 1554      PT SHORT TERM GOAL #1   Title Pt to be independent with initial HEP    Time 2    Period Weeks    Status New     Target Date 10/06/20             PT Long Term Goals - 10/01/20 0954      PT LONG TERM GOAL #1   Title Pt to be independent with final HEP    Time 6    Period Weeks    Status New    Target Date 11/03/20      PT LONG TERM GOAL #2   Title Pt to demo improved strength of hips and knees to at least 4+/5 to improve stability and gait    Time 6    Period Weeks    Status New    Target Date 11/04/19      PT LONG TERM GOAL #3   Title Pt to demo dynamic balance to be WNL for pt age, to improve safety and risk for falls.    Time 6    Period Weeks    Status New    Target Date 11/04/19      PT LONG TERM GOAL #4   Title Pt to report ability for walking and exercise up to 30 min without pain or deficit, to improve ability for exercise and weight loss.    Time 6    Period Weeks    Status New    Target Date 11/04/19                 Plan - 10/14/20 1103    Clinical Impression Statement Cueing for slowind down movements, and focusing on controlled movments, and decreased movment of trunk with standing activities, for improved stability. Pt challenged with most activities due to stability, plan to progress as tolerated.    Examination-Activity Limitations Stand;Carry;Squat;Stairs    Examination-Participation Restrictions Cleaning;Yard Work;Community Activity;Shop    Stability/Clinical Decision Making Stable/Uncomplicated    Rehab Potential Good    PT Frequency 2x / week    PT Duration 6 weeks    PT Treatment/Interventions ADLs/Self Care Home Management;Canalith Repostioning;Cryotherapy;Electrical Stimulation;Ultrasound;Traction;Moist Heat;Iontophoresis 4mg /ml Dexamethasone;DME Instruction;Gait training;Stair training;Functional mobility training;Therapeutic activities;Orthotic Fit/Training;Therapeutic exercise;Patient/family education;Neuromuscular re-education;Balance training;Manual techniques;Vasopneumatic Device;Taping;Energy conservation;Dry needling;Passive range of  motion;Spinal Manipulations;Joint Manipulations    Consulted and Agree with Plan of Care Patient  Patient will benefit from skilled therapeutic intervention in order to improve the following deficits and impairments:  Abnormal gait,Pain,Improper body mechanics,Decreased mobility,Increased muscle spasms,Decreased activity tolerance,Decreased endurance,Decreased range of motion,Decreased strength,Impaired flexibility,Difficulty walking,Decreased balance,Decreased safety awareness,Impaired perceived functional ability  Visit Diagnosis: Other abnormalities of gait and mobility     Problem List Patient Active Problem List   Diagnosis Date Noted   Incisional hernia 07/02/2015   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

## 2020-10-16 ENCOUNTER — Ambulatory Visit (INDEPENDENT_AMBULATORY_CARE_PROVIDER_SITE_OTHER): Payer: Medicare Other | Admitting: Physical Therapy

## 2020-10-16 ENCOUNTER — Other Ambulatory Visit: Payer: Self-pay

## 2020-10-16 ENCOUNTER — Encounter: Payer: Self-pay | Admitting: Physical Therapy

## 2020-10-16 DIAGNOSIS — R2689 Other abnormalities of gait and mobility: Secondary | ICD-10-CM

## 2020-10-16 NOTE — Therapy (Signed)
[facility] [address] [place], [place], [address] Phone: [phone]   Fax:  [phone]  Physical Therapy Treatment  Patient Details  Name: Evelyn Campbell MRN: [id number] Date of Birth: [birth year] Referring Provider (PT): [name]   Encounter Date: 10/16/2020   PT End of Session - 10/16/20 1529    Visit Number 7    Number of Visits 12    Date for PT Re-Evaluation 11/03/20    Authorization Type Medicare    PT Start Time 1520    PT Stop Time 1600    PT Time Calculation (min) 40 min    Activity Tolerance Patient tolerated treatment well    Behavior During Therapy [facility] for tasks assessed/performed           Past Medical History:  Diagnosis Date   Arthritis    R ankle    Back pain    Chronic kidney disease    seen by Nephrology at [facility], due to all her numbers were"off", she  remarks that she was sent to superhydrate & then her labs values corrected.  Pt. will f/u /w nephrology   Constipation    Fatty liver    Gallbladder problem    GERD (gastroesophageal reflux disease)    occ   Gout    Hyperlipidemia    Hypertension    Joint pain    Lower extremity edema    Obesity    Peripheral neuropathy    Pneumonia 1980's   hx- not needed hospitalization    Vitamin D deficiency     Past Surgical History:  Procedure Laterality Date   ABDOMINAL HYSTERECTOMY  99   ANKLE FUSION Right 6/15   CHOLECYSTECTOMY  10   DENTAL SURGERY     implant- upper R    HERNIA REPAIR     INCISIONAL HERNIA REPAIR N/A 07/02/2015   Procedure: LAPAROSCOPIC INCISIONAL HERNIA;  Surgeon: Coralie Keens, MD;  Location: [place];  Service: General;  Laterality: N/A;   INGUINAL HERNIA REPAIR Bilateral 08/20/2014   Procedure: LAPAROSCOPIC BILATERAL INGUINAL HERNIA REPAIR WITH MESH;  Surgeon: Coralie Keens, MD;  Location: [place];  Service: General;  Laterality: Bilateral;   INSERTION OF MESH N/A 08/20/2014   Procedure:  INSERTION OF MESH;  Surgeon: Coralie Keens, MD;  Location: [place];  Service: General;  Laterality: N/A;   INSERTION OF MESH N/A 07/02/2015   Procedure: INSERTION OF MESH;  Surgeon: Coralie Keens, MD;  Location: [place];  Service: General;  Laterality: N/A;   PANCREATECTOMY N/A 08/20/2014   Procedure: LAPAROSCOPIC ADRENALECTOMY;  Surgeon: Stark Klein, MD;  Location: [place];  Service: General;  Laterality: N/A;   TOE SURGERY Bilateral 2012   hammer toe   TONSILLECTOMY     UMBILICAL HERNIA REPAIR N/A 08/20/2014   Procedure: HERNIA REPAIR UMBILICAL ADULT;  Surgeon: Coralie Keens, MD;  Location: [place];  Service: General;  Laterality: N/A;   VAGINAL DELIVERY  1994   WRIST SURGERY      There were no vitals filed for this visit.   Subjective Assessment - 10/16/20 1528    Subjective Pt with no new complaints.    Currently in Pain? No/denies                             [facility] Adult PT Treatment/Exercise - 10/16/20 0001      Exercises   Exercises Knee/Hip      Knee/Hip Exercises: Aerobic  Recumbent Bike L2 x 8 min;       Knee/Hip Exercises: Standing   Hip Flexion 20 reps;Knee bent    Hip Abduction 20 reps;Both    Forward Step Up 10 reps;Both;Step Height: 6";Hand Hold: 1    Forward Step Up Limitations With opp hip flex/march/ AIrEx    Stairs up/down 5 steps 1 HR x 4;     Other Standing Knee Exercises AirEx: trunk rotation x 10    Other Standing Knee Exercises Tandem stance 30 sec 3; SLS 10 sec x 3 bil; Walk/march 10 ft x 4;   Rows GTB x 20;      Knee/Hip Exercises: Seated   Long Arc Quad 20 reps    Long Arc Quad Weight 3 lbs.      Knee/Hip Exercises: Supine   Straight Leg Raises 15 reps;Both      Knee/Hip Exercises: Sidelying   Hip ABduction 15 reps;Both      Manual Therapy   Manual Therapy Passive ROM    Passive ROM Manual prone quad stretch.                    PT Short Term Goals - 10/16/20 1529      PT SHORT TERM GOAL #1   Title  Pt to be independent with initial HEP    Time 2    Period Weeks    Status Achieved    Target Date 10/06/20             PT Long Term Goals - 10/01/20 0954      PT LONG TERM GOAL #1   Title Pt to be independent with final HEP    Time 6    Period Weeks    Status New    Target Date 11/03/20      PT LONG TERM GOAL #2   Title Pt to demo improved strength of hips and knees to at least 4+/5 to improve stability and gait    Time 6    Period Weeks    Status New    Target Date 11/04/19      PT LONG TERM GOAL #3   Title Pt to demo dynamic balance to be WNL for pt age, to improve safety and risk for falls.    Time 6    Period Weeks    Status New    Target Date 11/04/19      PT LONG TERM GOAL #4   Title Pt to report ability for walking and exercise up to 30 min without pain or deficit, to improve ability for exercise and weight loss.    Time 6    Period Weeks    Status New    Target Date 11/04/19                 Plan - 10/16/20 1614    Clinical Impression Statement Pt challenged with stability exercises, no LOB. Pt to benefit from continued strengthening for hips, knees, core, as well as stability.    Examination-Activity Limitations Stand;Carry;Squat;Stairs    Examination-Participation Restrictions Cleaning;Yard Work;Community Activity;Shop    Stability/Clinical Decision Making Stable/Uncomplicated    Rehab Potential Good    PT Frequency 2x / week    PT Duration 6 weeks    PT Treatment/Interventions ADLs/Self Care Home Management;Canalith Repostioning;Cryotherapy;Electrical Stimulation;Ultrasound;Traction;Moist Heat;Iontophoresis 4mg /ml Dexamethasone;DME Instruction;Gait training;Stair training;Functional mobility training;Therapeutic activities;Orthotic Fit/Training;Therapeutic exercise;Patient/family education;Neuromuscular re-education;Balance training;Manual techniques;Vasopneumatic Device;Taping;Energy conservation;Dry needling;Passive range of motion;Spinal  Manipulations;Joint Manipulations    Consulted and  Agree with Plan of Care Patient           Patient will benefit from skilled therapeutic intervention in order to improve the following deficits and impairments:  Abnormal gait,Pain,Improper body mechanics,Decreased mobility,Increased muscle spasms,Decreased activity tolerance,Decreased endurance,Decreased range of motion,Decreased strength,Impaired flexibility,Difficulty walking,Decreased balance,Decreased safety awareness,Impaired perceived functional ability  Visit Diagnosis: Other abnormalities of gait and mobility     Problem List Patient Active Problem List   Diagnosis Date Noted   Incisional hernia 07/02/2015   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

## 2020-10-20 ENCOUNTER — Other Ambulatory Visit: Payer: Self-pay

## 2020-10-20 ENCOUNTER — Ambulatory Visit (INDEPENDENT_AMBULATORY_CARE_PROVIDER_SITE_OTHER): Payer: Medicare Other | Admitting: Physical Therapy

## 2020-10-20 ENCOUNTER — Encounter: Payer: Self-pay | Admitting: Physical Therapy

## 2020-10-20 DIAGNOSIS — R2689 Other abnormalities of gait and mobility: Secondary | ICD-10-CM

## 2020-10-20 NOTE — Therapy (Signed)
[facility] [address] [place], [place], [address] Phone: [phone]   Fax:  [phone]  Physical Therapy Treatment  Patient Details  Name: Evelyn Campbell MRN: [id number] Date of Birth: 1956/04/10 Referring Provider (PT): [name]   Encounter Date: 10/20/2020   PT End of Session - 10/20/20 1312    Visit Number 8    Number of Visits 12    Date for PT Re-Evaluation 11/03/20    Authorization Type Medicare    PT Start Time 1308    PT Stop Time 1346    PT Time Calculation (min) 38 min    Activity Tolerance Patient tolerated treatment well    Behavior During Therapy [facility] for tasks assessed/performed           Past Medical History:  Diagnosis Date  . Arthritis    R ankle   . Back pain   . Chronic kidney disease    seen by Nephrology at [facility], due to all her numbers were"off", she  remarks that she was sent to superhydrate & then her labs values corrected.  Pt. will f/u /w nephrology  . Constipation   . Fatty liver   . Gallbladder problem   . GERD (gastroesophageal reflux disease)    occ  . Gout   . Hyperlipidemia   . Hypertension   . Joint pain   . Lower extremity edema   . Obesity   . Peripheral neuropathy   . Pneumonia 1980's   hx- not needed hospitalization   . Vitamin D deficiency     Past Surgical History:  Procedure Laterality Date  . ABDOMINAL HYSTERECTOMY  99  . ANKLE FUSION Right 6/15  . CHOLECYSTECTOMY  10  . DENTAL SURGERY     implant- upper R   . HERNIA REPAIR    . INCISIONAL HERNIA REPAIR N/A 07/02/2015   Procedure: LAPAROSCOPIC INCISIONAL HERNIA;  Surgeon: Coralie Keens, MD;  Location: [place];  Service: General;  Laterality: N/A;  . INGUINAL HERNIA REPAIR Bilateral 08/20/2014   Procedure: LAPAROSCOPIC BILATERAL INGUINAL HERNIA REPAIR WITH MESH;  Surgeon: Coralie Keens, MD;  Location: [place];  Service: General;  Laterality: Bilateral;  . INSERTION OF MESH N/A 08/20/2014   Procedure:  INSERTION OF MESH;  Surgeon: Coralie Keens, MD;  Location: [place];  Service: General;  Laterality: N/A;  . INSERTION OF MESH N/A 07/02/2015   Procedure: INSERTION OF MESH;  Surgeon: Coralie Keens, MD;  Location: [place];  Service: General;  Laterality: N/A;  . PANCREATECTOMY N/A 08/20/2014   Procedure: LAPAROSCOPIC ADRENALECTOMY;  Surgeon: Stark Klein, MD;  Location: [place];  Service: General;  Laterality: N/A;  . TOE SURGERY Bilateral 2012   hammer toe  . TONSILLECTOMY    . UMBILICAL HERNIA REPAIR N/A 08/20/2014   Procedure: HERNIA REPAIR UMBILICAL ADULT;  Surgeon: Coralie Keens, MD;  Location: [place];  Service: General;  Laterality: N/A;  . [place]  . WRIST SURGERY      There were no vitals filed for this visit.   Subjective Assessment - 10/20/20 1311    Subjective Pt with no new complaints.    Currently in Pain? No/denies                             [facility] Adult PT Treatment/Exercise - 10/20/20 1312      Exercises   Exercises Knee/Hip      Knee/Hip Exercises: Stretches  Active Hamstring Stretch 3 reps;30 seconds    Other Knee/Hip Stretches Piriformis, supine 30 sec x 3;      Knee/Hip Exercises: Aerobic   Recumbent Bike L2 x 10 min;      Knee/Hip Exercises: Standing   Hip Flexion 20 reps;Knee bent    Hip Abduction 20 reps;Both    Forward Step Up --    Forward Step Up Limitations --    Stairs up/down 5 steps 1 HR x 4;     Other Standing Knee Exercises Toe taps 6 in step x 25; no UE support    Other Standing Knee Exercises Tandem stance 30 sec 3;      Knee/Hip Exercises: Seated   Long Arc Quad 20 reps    Long Arc Quad Weight 3 lbs.      Knee/Hip Exercises: Supine   Straight Leg Raises 15 reps;Both    Straight Leg Raises Limitations with TA      Knee/Hip Exercises: Sidelying   Hip ABduction 15 reps;Both      Manual Therapy   Manual Therapy Passive ROM    Passive ROM TPR to L glute  ,manual piriformis stretch 30 sec x 3;                     PT Short Term Goals - 10/16/20 1529      PT SHORT TERM GOAL #1   Title Pt to be independent with initial HEP    Time 2    Period Weeks    Status Achieved    Target Date 10/06/20             PT Long Term Goals - 10/01/20 0954      PT LONG TERM GOAL #1   Title Pt to be independent with final HEP    Time 6    Period Weeks    Status New    Target Date 11/03/20      PT LONG TERM GOAL #2   Title Pt to demo improved strength of hips and knees to at least 4+/5 to improve stability and gait    Time 6    Period Weeks    Status New    Target Date 11/04/19      PT LONG TERM GOAL #3   Title Pt to demo dynamic balance to be WNL for pt age, to improve safety and risk for falls.    Time 6    Period Weeks    Status New    Target Date 11/04/19      PT LONG TERM GOAL #4   Title Pt to report ability for walking and exercise up to 30 min without pain or deficit, to improve ability for exercise and weight loss.    Time 6    Period Weeks    Status New    Target Date 11/04/19                 Plan - 10/20/20 1348    Clinical Impression Statement Pt with improving strength. Minimal knee pain today. Some tenderness in L glute, added stretching for HEP and discussed using heating pad. Plan to progress ther ex, likely d/c in next 2 weeks.    Examination-Activity Limitations Stand;Carry;Squat;Stairs    Examination-Participation Restrictions Cleaning;Yard Work;Community Activity;Shop    Stability/Clinical Decision Making Stable/Uncomplicated    Rehab Potential Good    PT Frequency 2x / week    PT Duration 6 weeks    PT Treatment/Interventions  ADLs/Self Care Home Management;Canalith Repostioning;Cryotherapy;Electrical Stimulation;Ultrasound;Traction;Moist Heat;Iontophoresis 4mg /ml Dexamethasone;DME Instruction;Gait training;Stair training;Functional mobility training;Therapeutic activities;Orthotic Fit/Training;Therapeutic exercise;Patient/family  education;Neuromuscular re-education;Balance training;Manual techniques;Vasopneumatic Device;Taping;Energy conservation;Dry needling;Passive range of motion;Spinal Manipulations;Joint Manipulations    Consulted and Agree with Plan of Care Patient           Patient will benefit from skilled therapeutic intervention in order to improve the following deficits and impairments:  Abnormal gait,Pain,Improper body mechanics,Decreased mobility,Increased muscle spasms,Decreased activity tolerance,Decreased endurance,Decreased range of motion,Decreased strength,Impaired flexibility,Difficulty walking,Decreased balance,Decreased safety awareness,Impaired perceived functional ability  Visit Diagnosis: Other abnormalities of gait and mobility     Problem List Patient Active Problem List   Diagnosis Date Noted  . Incisional hernia 07/02/2015  . 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 Essential hypertension, benign 06/14/2014  . Family history of ischemic heart disease 06/14/2014  . Obesity, unspecified 06/14/2014    Lyndee Hensen, PT, DPT 1:51 PM  10/20/20    [facility] [place], [place], [address] Phone: [phone]   Fax:  [phone]  Name: Waunita Sandstrom MRN: [id number] Date of Birth: 07-Dec-1955

## 2020-10-21 ENCOUNTER — Encounter: Payer: BC Managed Care – PPO | Admitting: Physical Therapy

## 2020-10-28 ENCOUNTER — Other Ambulatory Visit: Payer: Self-pay

## 2020-10-28 ENCOUNTER — Ambulatory Visit (INDEPENDENT_AMBULATORY_CARE_PROVIDER_SITE_OTHER): Payer: Medicare Other | Admitting: Physical Therapy

## 2020-10-28 ENCOUNTER — Encounter: Payer: Self-pay | Admitting: Physical Therapy

## 2020-10-28 DIAGNOSIS — R2689 Other abnormalities of gait and mobility: Secondary | ICD-10-CM

## 2020-10-28 NOTE — Therapy (Signed)
[facility]  [facility] [address] [place], [place], [address] Phone: [phone]   Fax:  [phone]  Physical Therapy Treatment  Patient Details  Name: Evelyn Campbell MRN: [id number] Date of Birth: June 10, 1956 Referring Provider (PT): Helane Rima   Encounter Date: 10/28/2020   PT End of Session - 10/28/20 1632    Visit Number 9    Number of Visits 12    Date for PT Re-Evaluation 11/03/20    Authorization Type Medicare    PT Start Time 1435    PT Stop Time 1515    PT Time Calculation (min) 40 min    Activity Tolerance Patient tolerated treatment well    Behavior During Therapy [facility] for tasks assessed/performed           Past Medical History:  Diagnosis Date  . Arthritis    R ankle   . Back pain   . Chronic kidney disease    seen by Nephrology at [facility], due to all her numbers were"off", she  remarks that she was sent to superhydrate & then her labs values corrected.  Pt. will f/u /w nephrology  . Constipation   . Fatty liver   . Gallbladder problem   . GERD (gastroesophageal reflux disease)    occ  . Gout   . Hyperlipidemia   . Hypertension   . Joint pain   . Lower extremity edema   . Obesity   . Peripheral neuropathy   . Pneumonia 1980's   hx- not needed hospitalization   . Vitamin D deficiency     Past Surgical History:  Procedure Laterality Date  . ABDOMINAL HYSTERECTOMY  99  . ANKLE FUSION Right 6/15  . CHOLECYSTECTOMY  10  . DENTAL SURGERY     implant- upper R   . HERNIA REPAIR    . INCISIONAL HERNIA REPAIR N/A 07/02/2015   Procedure: LAPAROSCOPIC INCISIONAL HERNIA;  Surgeon: Abigail Miyamoto, MD;  Location: [facility] OR;  Service: General;  Laterality: N/A;  . INGUINAL HERNIA REPAIR Bilateral 08/20/2014   Procedure: LAPAROSCOPIC BILATERAL INGUINAL HERNIA REPAIR WITH MESH;  Surgeon: Abigail Miyamoto, MD;  Location: [facility] OR;  Service: General;  Laterality: Bilateral;  . INSERTION OF MESH N/A 08/20/2014   Procedure:  INSERTION OF MESH;  Surgeon: Abigail Miyamoto, MD;  Location: [facility] OR;  Service: General;  Laterality: N/A;  . INSERTION OF MESH N/A 07/02/2015   Procedure: INSERTION OF MESH;  Surgeon: Abigail Miyamoto, MD;  Location: [facility] OR;  Service: General;  Laterality: N/A;  . PANCREATECTOMY N/A 08/20/2014   Procedure: LAPAROSCOPIC ADRENALECTOMY;  Surgeon: Almond Lint, MD;  Location: [facility] OR;  Service: General;  Laterality: N/A;  . TOE SURGERY Bilateral 2012   hammer toe  . TONSILLECTOMY    . UMBILICAL HERNIA REPAIR N/A 08/20/2014   Procedure: HERNIA REPAIR UMBILICAL ADULT;  Surgeon: Abigail Miyamoto, MD;  Location: [facility] OR;  Service: General;  Laterality: N/A;  . VAGINAL DELIVERY  1994  . WRIST SURGERY      There were no vitals filed for this visit.   Subjective Assessment - 10/28/20 1631    Subjective Pt states knee and hip feeling bette.r    Currently in Pain? No/denies                             [facility] Adult PT Treatment/Exercise - 10/28/20 1450      Exercises   Exercises Knee/Hip      Knee/Hip Exercises: Stretches  Active Hamstring Stretch 3 reps;30 seconds    Other Knee/Hip Stretches Piriformis, supine 30 sec x 3;      Knee/Hip Exercises: Aerobic   Recumbent Bike L2 x 8  min;      Knee/Hip Exercises: Standing   Hip Flexion 20 reps;Knee bent    Hip Abduction 20 reps;Both    Forward Step Up 10 reps;Both    Forward Step Up Limitations With opp hip flex/march    Stairs up/down 5 steps 1 HR x 4;     Other Standing Knee Exercises March/walk 10 ft x 6;    Other Standing Knee Exercises Tandem stance 30 sec 3; with L/R head turns, nods all x 10 bil;      Knee/Hip Exercises: Seated   Long Arc Quad 20 reps    Long Arc Quad Weight 4 lbs.      Knee/Hip Exercises: Supine   Straight Leg Raises --    Straight Leg Raises Limitations --      Knee/Hip Exercises: Sidelying   Hip ABduction --      Manual Therapy   Manual Therapy Passive ROM    Passive ROM --                     PT Short Term Goals - 10/16/20 1529      PT SHORT TERM GOAL #1   Title Pt to be independent with initial HEP    Time 2    Period Weeks    Status Achieved    Target Date 10/06/20             PT Long Term Goals - 10/01/20 0954      PT LONG TERM GOAL #1   Title Pt to be independent with final HEP    Time 6    Period Weeks    Status New    Target Date 11/03/20      PT LONG TERM GOAL #2   Title Pt to demo improved strength of hips and knees to at least 4+/5 to improve stability and gait    Time 6    Period Weeks    Status New    Target Date 11/04/19      PT LONG TERM GOAL #3   Title Pt to demo dynamic balance to be WNL for pt age, to improve safety and risk for falls.    Time 6    Period Weeks    Status New    Target Date 11/04/19      PT LONG TERM GOAL #4   Title Pt to report ability for walking and exercise up to 30 min without pain or deficit, to improve ability for exercise and weight loss.    Time 6    Period Weeks    Status New    Target Date 11/04/19                 Plan - 10/28/20 1634    Clinical Impression Statement Pt progressing well, improving with stability exercises,  still challenged but performing with less sway. Pt notes that she is still walking the neighborhood using a tri-walker, discussed no need to keep using this. Also discussed continuing to increase activitiy for weight loss. Likely d/c later this week.    Examination-Activity Limitations Stand;Carry;Squat;Stairs    Examination-Participation Restrictions Cleaning;Yard Work;Community Activity;Shop    Stability/Clinical Decision Making Stable/Uncomplicated    Rehab Potential Good    PT Frequency 2x / week  PT Duration 6 weeks    PT Treatment/Interventions ADLs/Self Care Home Management;Canalith Repostioning;Cryotherapy;Electrical Stimulation;Ultrasound;Traction;Moist Heat;Iontophoresis 4mg /ml Dexamethasone;DME Instruction;Gait training;Stair training;Functional  mobility training;Therapeutic activities;Orthotic Fit/Training;Therapeutic exercise;Patient/family education;Neuromuscular re-education;Balance training;Manual techniques;Vasopneumatic Device;Taping;Energy conservation;Dry needling;Passive range of motion;Spinal Manipulations;Joint Manipulations    Consulted and Agree with Plan of Care Patient           Patient will benefit from skilled therapeutic intervention in order to improve the following deficits and impairments:  Abnormal gait,Pain,Improper body mechanics,Decreased mobility,Increased muscle spasms,Decreased activity tolerance,Decreased endurance,Decreased range of motion,Decreased strength,Impaired flexibility,Difficulty walking,Decreased balance,Decreased safety awareness,Impaired perceived functional ability  Visit Diagnosis: Other abnormalities of gait and mobility     Problem List Patient Active Problem List   Diagnosis Date Noted  . Incisional hernia 07/02/2015  . Left adrenal mass ([place]) 08/20/2014  . Essential hypertension, benign 06/14/2014  . Family history of ischemic heart disease 06/14/2014  . Obesity, unspecified 06/14/2014    Lyndee Hensen, PT, DPT 4:36 PM  10/28/20     [place], [place], [address] Phone: [phone]   Fax:  [phone]  Name: Kahlee Vanderwal MRN: [id number] Date of Birth: 06-Nov-1955

## 2020-10-30 ENCOUNTER — Other Ambulatory Visit: Payer: Self-pay

## 2020-10-30 ENCOUNTER — Ambulatory Visit (INDEPENDENT_AMBULATORY_CARE_PROVIDER_SITE_OTHER): Payer: Medicare Other | Admitting: Physical Therapy

## 2020-10-30 ENCOUNTER — Encounter: Payer: Self-pay | Admitting: Physical Therapy

## 2020-10-30 DIAGNOSIS — R2689 Other abnormalities of gait and mobility: Secondary | ICD-10-CM

## 2020-10-30 NOTE — Patient Instructions (Signed)
Access Code: [email or web address] URL: [email or web address] Date: 10/30/2020 Prepared by: Sedalia Muta  Exercises Standing March with Counter Support - 2 x daily - 2 sets - 10 reps Standing Hip Abduction with Counter Support - 1 x daily - 2 sets - 10 reps Heel rises with counter support - 1 x daily - 2 sets - 10 reps Seated Knee Extension AROM - 1-2 x daily - 2 sets - 10 reps Straight Leg Raise - 1 x daily - 1-2 sets - 10 reps Standing Tandem Balance with Counter Support - 2 x daily - 3 reps - 30 hold Runner's Step Up/Down - 1 x daily - 1 sets - 10 reps

## 2020-11-03 ENCOUNTER — Encounter: Payer: Self-pay | Admitting: Physical Therapy

## 2020-11-03 NOTE — Therapy (Signed)
[facility] [address] [place], [place], [address] Phone: [phone]   Fax:  [phone]  Physical Therapy Treatment/Discharge   Patient Details  Name: Evelyn Delagarza MRN: [id number] Date of Birth: [birth year] Referring Provider (PT): [name]   Encounter Date: 10/30/2020   PT End of Session - 11/03/20 1238    Visit Number 10    Number of Visits 12    Date for PT Re-Evaluation 11/03/20    Authorization Type Medicare    PT Start Time 2330    PT Stop Time 1600    PT Time Calculation (min) 44 min    Activity Tolerance Patient tolerated treatment well    Behavior During Therapy [facility] for tasks assessed/performed           Past Medical History:  Diagnosis Date  . Arthritis    R ankle   . Back pain   . Chronic kidney disease    seen by Nephrology at [facility], due to all her numbers were"off", she  remarks that she was sent to superhydrate & then her labs values corrected.  Pt. will f/u /w nephrology  . Constipation   . Fatty liver   . Gallbladder problem   . GERD (gastroesophageal reflux disease)    occ  . Gout   . Hyperlipidemia   . Hypertension   . Joint pain   . Lower extremity edema   . Obesity   . Peripheral neuropathy   . Pneumonia 1980's   hx- not needed hospitalization   . Vitamin D deficiency     Past Surgical History:  Procedure Laterality Date  . ABDOMINAL HYSTERECTOMY  99  . ANKLE FUSION Right 6/15  . CHOLECYSTECTOMY  10  . DENTAL SURGERY     implant- upper R   . HERNIA REPAIR    . INCISIONAL HERNIA REPAIR N/A 07/02/2015   Procedure: LAPAROSCOPIC INCISIONAL HERNIA;  Surgeon: Coralie Keens, MD;  Location: [place];  Service: General;  Laterality: N/A;  . INGUINAL HERNIA REPAIR Bilateral 08/20/2014   Procedure: LAPAROSCOPIC BILATERAL INGUINAL HERNIA REPAIR WITH MESH;  Surgeon: Coralie Keens, MD;  Location: [place];  Service: General;  Laterality: Bilateral;  . INSERTION OF MESH N/A 08/20/2014    Procedure: INSERTION OF MESH;  Surgeon: Coralie Keens, MD;  Location: [place];  Service: General;  Laterality: N/A;  . INSERTION OF MESH N/A 07/02/2015   Procedure: INSERTION OF MESH;  Surgeon: Coralie Keens, MD;  Location: [place];  Service: General;  Laterality: N/A;  . PANCREATECTOMY N/A 08/20/2014   Procedure: LAPAROSCOPIC ADRENALECTOMY;  Surgeon: Stark Klein, MD;  Location: [place];  Service: General;  Laterality: N/A;  . TOE SURGERY Bilateral 2012   hammer toe  . TONSILLECTOMY    . UMBILICAL HERNIA REPAIR N/A 08/20/2014   Procedure: HERNIA REPAIR UMBILICAL ADULT;  Surgeon: Coralie Keens, MD;  Location: [place];  Service: General;  Laterality: N/A;  . [place]  . WRIST SURGERY      There were no vitals filed for this visit.   Subjective Assessment - 11/03/20 1238    Subjective Pt with no new complaints.    Currently in Pain? No/denies                             [facility] Adult PT Treatment/Exercise - 11/03/20 0001      Exercises   Exercises Knee/Hip      Knee/Hip Exercises: Aerobic  Recumbent Bike L2 x 8  min;      Knee/Hip Exercises: Standing   Hip Flexion 20 reps;Knee bent    Hip Abduction 20 reps;Both    Forward Step Up 10 reps;Both    Forward Step Up Limitations With opp hip flex/march    Stairs up/down 5 steps 1 HR x 6;    Other Standing Knee Exercises March/walk 10 ft x 6; bwd walk 10 ft x 4; Toe taps 6 in step x 20; Side stepping 10 ft x 4;    Other Standing Knee Exercises Tandem stance 30 sec 3; with L/R head turns, nods all x 10 bil; tandem walk 10 ft x  4;      Knee/Hip Exercises: Seated   Long Arc Quad 20 reps    Long Arc Quad Weight 4 lbs.      Manual Therapy   Manual Therapy Passive ROM                  PT Education - 11/03/20 1238    Education Details FInal HEP reviewed    Person(s) Educated Patient    Methods Explanation;Demonstration;Verbal cues;Handout    Comprehension Verbalized understanding;Returned  demonstration;Verbal cues required            PT Short Term Goals - 10/16/20 1529      PT SHORT TERM GOAL #1   Title Pt to be independent with initial HEP    Time 2    Period Weeks    Status Achieved    Target Date 10/06/20             PT Long Term Goals - 11/03/20 1240      PT LONG TERM GOAL #1   Title Pt to be independent with final HEP    Time 6    Period Weeks    Status Achieved      PT LONG TERM GOAL #2   Title Pt to demo improved strength of hips and knees to at least 4+/5 to improve stability and gait    Time 6    Period Weeks    Status Achieved      PT LONG TERM GOAL #3   Title Pt to demo dynamic balance to be WNL for pt age, to improve safety and risk for falls.    Time 6    Period Weeks    Status Partially Met      PT LONG TERM GOAL #4   Title Pt to report ability for walking and exercise up to 30 min without pain or deficit, to improve ability for exercise and weight loss.    Time 6    Period Weeks    Status Achieved                 Plan - 11/03/20 1240    Clinical Impression Statement Pt has made good progress, and has met goals at this time. Ready for d/c to HEP. Pt with no signfiicant deficits that require skilled care. She wil continue to work on LE strength and stability with HEP. Pt also doing TaiChi. She has mild instability with higher level balance exercises, but is safe for all home and community activity. Discussed importance of continuing physical activity for weight loss.    Examination-Activity Limitations Stand;Carry;Squat;Stairs    Examination-Participation Restrictions Cleaning;Yard Work;Community Activity;Shop    Stability/Clinical Decision Making Stable/Uncomplicated    Rehab Potential Good    PT Frequency 2x / week    PT Duration  6 weeks    PT Treatment/Interventions ADLs/Self Care Home Management;Canalith Repostioning;Cryotherapy;Electrical Stimulation;Ultrasound;Traction;Moist Heat;Iontophoresis 34m/ml Dexamethasone;DME  Instruction;Gait training;Stair training;Functional mobility training;Therapeutic activities;Orthotic Fit/Training;Therapeutic exercise;Patient/family education;Neuromuscular re-education;Balance training;Manual techniques;Vasopneumatic Device;Taping;Energy conservation;Dry needling;Passive range of motion;Spinal Manipulations;Joint Manipulations    Consulted and Agree with Plan of Care Patient           Patient will benefit from skilled therapeutic intervention in order to improve the following deficits and impairments:  Abnormal gait,Pain,Improper body mechanics,Decreased mobility,Increased muscle spasms,Decreased activity tolerance,Decreased endurance,Decreased range of motion,Decreased strength,Impaired flexibility,Difficulty walking,Decreased balance,Decreased safety awareness,Impaired perceived functional ability  Visit Diagnosis: Other abnormalities of gait and mobility     Problem List Patient Active Problem List   Diagnosis Date Noted  . Incisional hernia 07/02/2015  . Left adrenal mass ([place] 08/20/2014  . Essential hypertension, benign 06/14/2014  . Family history of ischemic heart disease 06/14/2014  . Obesity, unspecified 06/14/2014   LLyndee Hensen PT, DPT 12:45 PM  11/03/20   [place] [address]Phone: [phone]  Fax:  [phone] Name: RAunika KirstenMRN: [id number] of Birth: 2Jun 18, 1957  PHYSICAL THERAPY DISCHARGE SUMMARY Visits:  10  Plan: Patient agrees to discharge.  Patient goals were met. Patient is being discharged due to meeting the stated rehab goals.  ?????     LLyndee Hensen PT, DPT 12:45 PM  11/03/20

## 2020-11-06 ENCOUNTER — Other Ambulatory Visit: Payer: Self-pay

## 2020-11-06 ENCOUNTER — Encounter (INDEPENDENT_AMBULATORY_CARE_PROVIDER_SITE_OTHER): Payer: Self-pay | Admitting: Family Medicine

## 2020-11-06 ENCOUNTER — Ambulatory Visit (INDEPENDENT_AMBULATORY_CARE_PROVIDER_SITE_OTHER): Payer: Medicare Other | Admitting: Family Medicine

## 2020-11-06 VITALS — BP 128/80 | HR 56 | Temp 97.6°F | Ht 65.0 in | Wt 232.0 lb

## 2020-11-06 DIAGNOSIS — E8881 Metabolic syndrome: Secondary | ICD-10-CM

## 2020-11-06 DIAGNOSIS — Z6838 Body mass index (BMI) 38.0-38.9, adult: Secondary | ICD-10-CM | POA: Diagnosis not present

## 2020-11-06 DIAGNOSIS — E7849 Other hyperlipidemia: Secondary | ICD-10-CM

## 2020-11-06 DIAGNOSIS — R7303 Prediabetes: Secondary | ICD-10-CM

## 2020-11-06 DIAGNOSIS — I10 Essential (primary) hypertension: Secondary | ICD-10-CM | POA: Diagnosis not present

## 2020-11-12 NOTE — Progress Notes (Signed)
Chief Complaint:   OBESITY Evelyn Campbell is here to discuss her progress with her obesity treatment plan along with follow-up of her obesity related diagnoses.   Today's visit was #: 8 Starting weight: 246 lbs Starting date: 06/05/2020 Today's weight: 232 lbs Today's date: 11/06/2020 Total lbs lost to date: 14 lbs Body mass index is 38.61 kg/m.  Total weight loss percentage to date: -5.69%  Interim History: Danice says she stayed home during the holidays due to [place].  She continues to exercise.  She says she did some indulging in heavier holiday foods. Nutrition Plan: keeping a food journal and adhering to recommended goals of 1200 calories and 95 grams of protein for 90% of the time.  Activity: Cardio/strength training/walking for 30+ minutes 7 times per week.  Assessment/Plan:   1. Prediabetes She will continue to focus on protein-rich, low simple carbohydrate foods. We reviewed the importance of hydration, regular exercise for stress reduction, and restorative sleep.   Lab Results  Component Value Date   HGBA1C 5.9 (H) 05/22/2020   Lab Results  Component Value Date   INSULIN 22.1 05/22/2020   2. Essential hypertension At goal. Medications: Norvasc 5 mg daily and atenolol 100 mg daily.   Plan: Avoid buying foods that are: processed, frozen, or prepackaged to avoid excess salt. We will continue to monitor symptoms as they relate to her weight loss journey.  BP Readings from Last 3 Encounters:  11/06/20 128/80  10/02/20 132/86  09/04/20 136/73   Lab Results  Component Value Date   CREATININE 0.78 05/22/2020   3. Other hyperlipidemia Lipid-lowering medications: Lovaza.   Plan: Dietary changes: Increase soluble fiber. Decrease simple carbohydrates. Exercise changes: An average 40 minutes of moderate to vigorous-intensity aerobic activity 3 or 4 times per week.   Lab Results  Component Value Date   ALT 33 (H) 05/22/2020   AST 31 05/22/2020   ALKPHOS 116 05/22/2020    BILITOT 0.4 05/22/2020   4. Metabolic syndrome Starting goal: Lose 7-10% of starting weight. She will continue to focus on protein-rich, low simple carbohydrate foods. We reviewed the importance of hydration, regular exercise for stress reduction, and restorative sleep.  We will continue to check lab work every 3 months, with 10% weight loss, or should any other concerns arise.  Visceral fat rating is 17.  5. Class 2 severe obesity with serious comorbidity and body mass index (BMI) of 38.0 to 38.9 in adult, unspecified obesity type ([place])  Course: [name] is currently in the action stage of change. As such, her goal is to continue with weight loss efforts.   Nutrition goals: She has agreed to keeping a food journal and adhering to recommended goals of 1200 calories and 95 grams of protein.   Exercise goals: For substantial health benefits, adults should do at least 150 minutes (2 hours and 30 minutes) a week of moderate-intensity, or 75 minutes (1 hour and 15 minutes) a week of vigorous-intensity aerobic physical activity, or an equivalent combination of moderate- and vigorous-intensity aerobic activity. Aerobic activity should be performed in episodes of at least 10 minutes, and preferably, it should be spread throughout the week.  Behavioral modification strategies: increasing lean protein intake, decreasing simple carbohydrates, increasing vegetables, increasing water intake, decreasing liquid calories and decreasing alcohol intake.  [name] has agreed to follow-up with our clinic in 4 weeks. She was informed of the importance of frequent follow-up visits to maximize her success with intensive lifestyle modifications for her multiple health conditions.  Objective:   Blood pressure 128/80, pulse (!) 56, temperature 97.6 F (36.4 C), temperature source Oral, height 5\' 5"  (1.651 m), weight 232 lb (105.2 kg), SpO2 99 %. Body mass index is 38.61 kg/m.  General: Cooperative, alert, well  developed, in no acute distress. HEENT: Conjunctivae and lids unremarkable. Cardiovascular: Regular rhythm.  Lungs: Normal work of breathing. Neurologic: No focal deficits.   Lab Results  Component Value Date   CREATININE 0.78 05/22/2020   BUN 19 05/22/2020   NA 138 05/22/2020   K 4.6 05/22/2020   CL 98 05/22/2020   CO2 22 05/22/2020   Lab Results  Component Value Date   ALT 33 (H) 05/22/2020   AST 31 05/22/2020   ALKPHOS 116 05/22/2020   BILITOT 0.4 05/22/2020   Lab Results  Component Value Date   HGBA1C 5.9 (H) 05/22/2020   Lab Results  Component Value Date   INSULIN 22.1 05/22/2020   Lab Results  Component Value Date   WBC 6.2 05/22/2020   HGB 14.6 05/22/2020   HCT 44.0 05/22/2020   MCV 85 05/22/2020   PLT 186 05/22/2020   Obesity Behavioral Intervention:   Approximately 15 minutes were spent on the discussion below.  ASK: We discussed the diagnosis of obesity with Evelyn Campbell today and Evelyn Campbell agreed to give [name] permission to discuss obesity behavioral modification therapy today.  ASSESS: Evelyn Campbell has the diagnosis of obesity and her BMI today is 38.6. Evelyn Campbell is in the action stage of change.   ADVISE: Evelyn Campbell was educated on the multiple health risks of obesity as well as the benefit of weight loss to improve her health. She was advised of the need for long term treatment and the importance of lifestyle modifications to improve her current health and to decrease her risk of future health problems.  AGREE: Multiple dietary modification options and treatment options were discussed and Evelyn Campbell agreed to follow the recommendations documented in the above note.  ARRANGE: Evelyn Campbell was educated on the importance of frequent visits to treat obesity as outlined per CMS and USPSTF guidelines and agreed to schedule her next follow up appointment today.  Attestation Statements:   Reviewed by clinician on day of visit: allergies, medications, problem list, medical  history, surgical history, family history, social history, and previous encounter notes.  I, Water quality scientist, CMA, am acting as transcriptionist for Evelyn Deutscher, DO  I have reviewed the above documentation for accuracy and completeness, and I agree with the above. Evelyn Deutscher, DO

## 2020-12-04 ENCOUNTER — Ambulatory Visit (INDEPENDENT_AMBULATORY_CARE_PROVIDER_SITE_OTHER): Payer: Medicare Other | Admitting: Family Medicine

## 2020-12-04 ENCOUNTER — Other Ambulatory Visit: Payer: Self-pay

## 2020-12-04 ENCOUNTER — Encounter (INDEPENDENT_AMBULATORY_CARE_PROVIDER_SITE_OTHER): Payer: Self-pay | Admitting: Family Medicine

## 2020-12-04 VITALS — BP 144/62 | HR 60 | Temp 97.7°F | Ht 65.0 in | Wt 235.0 lb

## 2020-12-04 DIAGNOSIS — I10 Essential (primary) hypertension: Secondary | ICD-10-CM

## 2020-12-04 DIAGNOSIS — E65 Localized adiposity: Secondary | ICD-10-CM | POA: Diagnosis not present

## 2020-12-04 DIAGNOSIS — E7849 Other hyperlipidemia: Secondary | ICD-10-CM

## 2020-12-04 DIAGNOSIS — Z6839 Body mass index (BMI) 39.0-39.9, adult: Secondary | ICD-10-CM | POA: Diagnosis not present

## 2020-12-04 DIAGNOSIS — R7303 Prediabetes: Secondary | ICD-10-CM

## 2020-12-04 MED ORDER — AMLODIPINE BESYLATE 5 MG PO TABS: 5.0000 mg | ORAL_TABLET | Freq: Every day | ORAL | 0 refills | Status: DC

## 2020-12-09 NOTE — Progress Notes (Signed)
Chief Complaint:   OBESITY Evelyn Campbell is here to discuss her progress with her obesity treatment plan along with follow-up of her obesity related diagnoses.   Today's visit was #: 9 Starting weight: 246 lbs Starting date: 06/05/2020 Today's weight: 235 lbs Today's date: 12/04/2020 Total lbs lost to date: 11 lbs Body mass index is 39.11 kg/m.  Total weight loss percentage to date: -4.47%  Challenge:  Skips lunch, eats while cooking dinner.  Needs more accountability.  Nutrition Plan: keeping a food journal and adhering to recommended goals of 1200 calories and 95 grams of protein for 70% of the time. Activity: Cardo / Walking for 40 minutes 7 times per week.  Assessment/Plan:   1. Essential hypertension Elevated today, but she was talking while the cuff was on.  Medications: Norvasc 5 mg daily, atenolol 100 mg at bedtime.   Plan: Avoid buying foods that are: processed, frozen, or prepackaged to avoid excess salt. We will continue to monitor closely alongside her PCP and/or Specialist.  Regular follow up with PCP and specialists was also encouraged.   BP Readings from Last 3 Encounters:  12/04/20 (!) 144/62  11/06/20 128/80  10/02/20 132/86   Lab Results  Component Value Date   CREATININE 0.78 05/22/2020   - Refill amLODipine (NORVASC) 5 MG tablet; Take 1 tablet (5 mg total) by mouth daily.  Dispense: 90 tablet; Refill: 0  2. Other hyperlipidemia Lipid-lowering medications: Omega-3.   Plan: Dietary changes: Increase soluble fiber, decrease simple carbohydrates, decrease saturated fat. Exercise changes: Moderate to vigorous-intensity aerobic activity 150 minutes per week or as tolerated. We will continue to monitor along with PCP/specialists as it pertains to her weight loss journey.  Lab Results  Component Value Date   ALT 33 (H) 05/22/2020   AST 31 05/22/2020   ALKPHOS 116 05/22/2020   BILITOT 0.4 05/22/2020   3. Prediabetes Improving, but not optimized. Goal is  HgbA1c < 5.7.  Medication: None.   Plan:  She will continue to focus on protein-rich, low simple carbohydrate foods. We reviewed the importance of hydration, regular exercise for stress reduction, and restorative sleep.   Lab Results  Component Value Date   HGBA1C 5.9 (H) 05/22/2020   Lab Results  Component Value Date   INSULIN 22.1 05/22/2020   4. Visceral obesity Current visceral fat rating: 17. Visceral fat rating should be < 13. Visceral adipose tissue is a hormonally active component of total body fat. This body composition phenotype is associated with medical disorders such as metabolic syndrome, cardiovascular disease and several malignancies including prostate, breast, and colorectal cancers. Starting goal: Lose 7-10% of starting weight.   5. Class 2 severe obesity with serious comorbidity and body mass index (BMI) of 39.0 to 39.9 in adult, unspecified obesity type ([place])  Course: Hanny is currently in the action stage of change. As such, her goal is to continue with weight loss efforts.   Nutrition goals: She has agreed to keeping a food journal and adhering to recommended goals of 1200 calories and 95 grams of protein.   Exercise goals: For substantial health benefits, adults should do at least 150 minutes (2 hours and 30 minutes) a week of moderate-intensity, or 75 minutes (1 hour and 15 minutes) a week of vigorous-intensity aerobic physical activity, or an equivalent combination of moderate- and vigorous-intensity aerobic activity. Aerobic activity should be performed in episodes of at least 10 minutes, and preferably, it should be spread throughout the week.  Behavioral modification strategies: increasing  lean protein intake and no skipping meals.  Kyrstyn has agreed to follow-up with our clinic in 2 weeks. She was informed of the importance of frequent follow-up visits to maximize her success with intensive lifestyle modifications for her multiple health conditions.    Objective:   Blood pressure (!) 144/62, pulse 60, temperature 97.7 F (36.5 C), temperature source Oral, height 5\' 5"  (1.651 m), weight 235 lb (106.6 kg), SpO2 98 %. Body mass index is 39.11 kg/m.  General: Cooperative, alert, well developed, in no acute distress. HEENT: Conjunctivae and lids unremarkable. Cardiovascular: Regular rhythm.  Lungs: Normal work of breathing. Neurologic: No focal deficits.   Lab Results  Component Value Date   CREATININE 0.78 05/22/2020   BUN 19 05/22/2020   NA 138 05/22/2020   K 4.6 05/22/2020   CL 98 05/22/2020   CO2 22 05/22/2020   Lab Results  Component Value Date   ALT 33 (H) 05/22/2020   AST 31 05/22/2020   ALKPHOS 116 05/22/2020   BILITOT 0.4 05/22/2020   Lab Results  Component Value Date   HGBA1C 5.9 (H) 05/22/2020   Lab Results  Component Value Date   INSULIN 22.1 05/22/2020   Lab Results  Component Value Date   WBC 6.2 05/22/2020   HGB 14.6 05/22/2020   HCT 44.0 05/22/2020   MCV 85 05/22/2020   PLT 186 05/22/2020   Obesity Behavioral Intervention:   Approximately 15 minutes were spent on the discussion below.  ASK: We discussed the diagnosis of obesity with Ayaan today and Ennis agreed to give [name] permission to discuss obesity behavioral modification therapy today.  ASSESS: Leeasia has the diagnosis of obesity and her BMI today is 39.1. Kalei is in the action stage of change.   ADVISE: Bill was educated on the multiple health risks of obesity as well as the benefit of weight loss to improve her health. She was advised of the need for long term treatment and the importance of lifestyle modifications to improve her current health and to decrease her risk of future health problems.  AGREE: Multiple dietary modification options and treatment options were discussed and Londen agreed to follow the recommendations documented in the above note.  ARRANGE: Rainah was educated on the importance of frequent visits  to treat obesity as outlined per CMS and USPSTF guidelines and agreed to schedule her next follow up appointment today.  Attestation Statements:   Reviewed by clinician on day of visit: allergies, medications, problem list, medical history, surgical history, family history, social history, and previous encounter notes.  I, Water quality scientist, CMA, am acting as transcriptionist for [name], DO  I have reviewed the above documentation for accuracy and completeness, and I agree with the above. [name], DO

## 2020-12-25 ENCOUNTER — Ambulatory Visit (INDEPENDENT_AMBULATORY_CARE_PROVIDER_SITE_OTHER): Payer: Medicare Other | Admitting: Adult Health

## 2020-12-25 ENCOUNTER — Encounter (INDEPENDENT_AMBULATORY_CARE_PROVIDER_SITE_OTHER): Payer: Self-pay | Admitting: Adult Health

## 2020-12-25 ENCOUNTER — Other Ambulatory Visit: Payer: Self-pay

## 2020-12-25 VITALS — BP 115/77 | HR 66 | Temp 97.6°F | Ht 65.0 in | Wt 236.0 lb

## 2020-12-25 DIAGNOSIS — Z6839 Body mass index (BMI) 39.0-39.9, adult: Secondary | ICD-10-CM | POA: Diagnosis not present

## 2020-12-25 DIAGNOSIS — E65 Localized adiposity: Secondary | ICD-10-CM | POA: Diagnosis not present

## 2020-12-25 DIAGNOSIS — I10 Essential (primary) hypertension: Secondary | ICD-10-CM | POA: Diagnosis not present

## 2020-12-26 LAB — LIPID PANEL
Cholesterol: 225 — AB (ref 0–200)
HDL: 65 (ref 35–70)
LDL Cholesterol: 124
LDl/HDL Ratio: 3.5
Triglycerides: 207 — AB (ref 40–160)

## 2020-12-29 NOTE — Progress Notes (Signed)
Chief Complaint:   OBESITY Evelyn Campbell is here to discuss her progress with her obesity treatment plan along with follow-up of her obesity related diagnoses. Evelyn Campbell is on keeping a food journal and adhering to recommended goals of 1200 calories and 95 g protein and states she is following her eating plan approximately 60-65% of the time. Evelyn Campbell states she is riding bike 40 minutes 7 times per week.  Today's visit was #: 10 Starting weight: 246 lbs Starting date: 06/05/2020 Today's weight: 236 lbs Today's date: 12/25/2020 Total lbs lost to date: 10 lbs Total lbs lost since last in-office visit: 0  Interim History: Evelyn Campbell recently celebrated her and her husband's birthday. She had cake and ice cream for several days. She has been decreasing her sodium and increasing water intake. She has been keeping an "internal tally" but not writing down intake or using My Fitness Pal. She has continued to snack when she cooks.  Subjective:   1. Essential hypertension Evelyn Campbell's BP and heart rate are excellent at OV today. She is on amlodipine 5 mg daily and atenolol 100 mg at bedtime.  BP Readings from Last 3 Encounters:  12/25/20 115/77  12/04/20 (!) 144/62  11/06/20 128/80    2. Visceral obesity Evelyn Campbell's visceral fat reading today 17, goal <10.  Assessment/Plan:   1. Essential hypertension Evelyn Campbell is working on healthy weight loss and exercise to improve blood pressure control. We will watch for signs of hypotension as she continues her lifestyle modifications. Continue current anti-hypertensive regimen.  2. Visceral obesity Increase protein and continue regular exercise.  3. Class 2 severe obesity with serious comorbidity and body mass index (BMI) of 39.0 to 39.9 in adult, unspecified obesity type ([facility]) Evelyn Campbell is currently in the action stage of change. As such, her goal is to continue with weight loss efforts. She has agreed to keeping a food journal and adhering to recommended goals  of 1200 calories and 95 g protein.   Exercise goals: As is- Increase walking with improved weather.  Behavioral modification strategies: increasing lean protein intake, decreasing simple carbohydrates, meal planning and cooking strategies, better snacking choices, planning for success and keeping a strict food journal.  Evelyn Campbell has agreed to follow-up with our clinic in 2 weeks. She was informed of the importance of frequent follow-up visits to maximize her success with intensive lifestyle modifications for her multiple health conditions.   Objective:   Blood pressure 115/77, pulse 66, temperature 97.6 F (36.4 C), height 5\' 5"  (1.651 m), weight 236 lb (107 kg), SpO2 97 %. Body mass index is 39.27 kg/m.  General: Cooperative, alert, well developed, in no acute distress. HEENT: Conjunctivae and lids unremarkable. Cardiovascular: Regular rhythm.  Lungs: Normal work of breathing. Neurologic: No focal deficits.   Lab Results  Component Value Date   CREATININE 0.78 05/22/2020   BUN 19 05/22/2020   NA 138 05/22/2020   K 4.6 05/22/2020   CL 98 05/22/2020   CO2 22 05/22/2020   Lab Results  Component Value Date   ALT 33 (H) 05/22/2020   AST 31 05/22/2020   ALKPHOS 116 05/22/2020   BILITOT 0.4 05/22/2020   Lab Results  Component Value Date   HGBA1C 5.9 (H) 05/22/2020   Lab Results  Component Value Date   INSULIN 22.1 05/22/2020   No results found for: TSH No results found for: CHOL, HDL, LDLCALC, LDLDIRECT, TRIG, CHOLHDL Lab Results  Component Value Date   WBC 6.2 05/22/2020   HGB 14.6 05/22/2020  HCT 44.0 05/22/2020   MCV 85 05/22/2020   PLT 186 05/22/2020    Attestation Statements:   Reviewed by clinician on day of visit: allergies, medications, problem list, medical history, surgical history, family history, social history, and previous encounter notes.  Time spent on visit including pre-visit chart review and post-visit care and charting was 34 minutes.   Coral Ceo, am acting as Location manager for Mina Marble, NP.  I have reviewed the above documentation for accuracy and completeness, and I agree with the above. -  Mikaylee Arseneau d. Charli Liberatore, NP-C

## 2021-01-08 ENCOUNTER — Ambulatory Visit (INDEPENDENT_AMBULATORY_CARE_PROVIDER_SITE_OTHER): Payer: Medicare Other | Admitting: Family Medicine

## 2021-01-08 ENCOUNTER — Encounter (INDEPENDENT_AMBULATORY_CARE_PROVIDER_SITE_OTHER): Payer: Self-pay | Admitting: Family Medicine

## 2021-01-08 ENCOUNTER — Other Ambulatory Visit: Payer: Self-pay

## 2021-01-08 VITALS — BP 152/82 | HR 60 | Temp 97.9°F | Ht 65.0 in | Wt 237.0 lb

## 2021-01-08 DIAGNOSIS — Z6839 Body mass index (BMI) 39.0-39.9, adult: Secondary | ICD-10-CM

## 2021-01-08 DIAGNOSIS — R7303 Prediabetes: Secondary | ICD-10-CM | POA: Insufficient documentation

## 2021-01-08 DIAGNOSIS — K5730 Diverticulosis of large intestine without perforation or abscess without bleeding: Secondary | ICD-10-CM | POA: Insufficient documentation

## 2021-01-08 DIAGNOSIS — R948 Abnormal results of function studies of other organs and systems: Secondary | ICD-10-CM

## 2021-01-08 DIAGNOSIS — E8881 Metabolic syndrome: Secondary | ICD-10-CM | POA: Diagnosis not present

## 2021-01-08 DIAGNOSIS — Z860100 Personal history of colon polyps, unspecified: Secondary | ICD-10-CM | POA: Insufficient documentation

## 2021-01-08 DIAGNOSIS — E782 Mixed hyperlipidemia: Secondary | ICD-10-CM | POA: Insufficient documentation

## 2021-01-08 DIAGNOSIS — M1A09X0 Idiopathic chronic gout, multiple sites, without tophus (tophi): Secondary | ICD-10-CM

## 2021-01-08 DIAGNOSIS — E559 Vitamin D deficiency, unspecified: Secondary | ICD-10-CM | POA: Insufficient documentation

## 2021-01-08 DIAGNOSIS — I872 Venous insufficiency (chronic) (peripheral): Secondary | ICD-10-CM | POA: Insufficient documentation

## 2021-01-08 DIAGNOSIS — E279 Disorder of adrenal gland, unspecified: Secondary | ICD-10-CM | POA: Insufficient documentation

## 2021-01-08 DIAGNOSIS — G609 Hereditary and idiopathic neuropathy, unspecified: Secondary | ICD-10-CM | POA: Insufficient documentation

## 2021-01-08 DIAGNOSIS — E65 Localized adiposity: Secondary | ICD-10-CM

## 2021-01-08 MED ORDER — METFORMIN HCL 500 MG PO TABS: 500.0000 mg | ORAL_TABLET | Freq: Every day | ORAL | 0 refills | Status: DC

## 2021-01-11 ENCOUNTER — Encounter (INDEPENDENT_AMBULATORY_CARE_PROVIDER_SITE_OTHER): Payer: Self-pay | Admitting: Family Medicine

## 2021-01-11 NOTE — Progress Notes (Signed)
Chief Complaint:   OBESITY Evelyn Campbell is here to discuss her progress with her obesity treatment plan along with follow-up of her obesity related diagnoses.   Today's visit was #: 11 Starting weight: 246 lbs  Starting date: 06/05/2020 Today's weight: 237 lbs Today's date: 01/08/2021 Total lbs lost to date: 9 lbs Body mass index is 39.44 kg/m.  Total weight loss percentage to date: -3.66%  Interim History:  Evelyn Campbell says that she is frustrated with the lack of scale movement. Strengths:  Exercise regimen. Challenges:  Water intake and slow metabolism. Plan:  Increase water intake to 100 ounces per day.  Change to Category 1 Plan.  Current Meal Plan: keeping a food journal and adhering to recommended goals of 1200 calories and 95 grams of protein for 90% of the time.  Current Exercise Plan: Walking for 40+ minutes 7 times per week.  Assessment/Plan:   1. Metabolic syndrome with visceral obesity Starting goal: Lose 7-10% of starting weight. She will continue to focus on protein-rich, low simple carbohydrate foods. We reviewed the importance of hydration, regular exercise for stress reduction, and restorative sleep.  We will continue to check lab work every 3 months, with 10% weight loss, or should any other concerns arise.  Current visceral fat rating: 17. Visceral fat rating should be < 13. Visceral adipose tissue is a hormonally active component of total body fat. This body composition phenotype is associated with medical disorders such as metabolic syndrome, cardiovascular disease and several malignancies including prostate, breast, and colorectal cancers.  2. Prediabetes Not at goal. Goal is HgbA1c < 5.7.  Medication: None.    Plan:  She will continue to focus on protein-rich, low simple carbohydrate foods. We reviewed the importance of hydration, regular exercise for stress reduction, and restorative sleep.  Will start metformin 500 mg daily, as per below.  Lab Results  Component  Value Date   HGBA1C 5.9 (H) 05/22/2020   Lab Results  Component Value Date   INSULIN 22.1 05/22/2020   - Start metFORMIN (GLUCOPHAGE) 500 MG tablet; Take 1 tablet (500 mg total) by mouth daily with breakfast.  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  Followed by PCP. Plan:  Stopped colchicine.  Will continue allopurinol 300 mg daily. We will continue to monitor symptoms as they relate to her weight loss journey.    4. Abnormal metabolism Evelyn Campbell's metabolism is slower than expected. Change to Category 1, with focus on protein. Continue exercise.   5. Class 2 severe obesity with serious comorbidity and body mass index (BMI) of 39.0 to 39.9 in adult, unspecified obesity type (Evelyn Campbell)  Course: Evelyn Campbell is currently in the action stage of change. As such, her goal is to continue with weight loss efforts.   Nutrition goals: She has agreed to the Category 1 Plan and keeping a food journal and adhering to recommended goals of 1000 calories and 85 grams of protein.   Exercise goals: As is.  Behavioral modification strategies: increasing lean protein intake, decreasing simple carbohydrates, increasing water intake, increasing high fiber foods and emotional eating strategies.  Evelyn Campbell has agreed to follow-up with our clinic in 3-4 weeks. She was informed of the importance of frequent follow-up visits to maximize her success with intensive lifestyle modifications for her multiple health conditions.   Objective:   Blood pressure (!) 152/82, pulse 60, temperature 97.9 F (36.6 C), temperature source Oral, height 5\' 5"  (1.651 m), weight 237 lb (  107.5 kg), SpO2 98 %. Body mass index is 39.44 kg/m.  General: Cooperative, alert, well developed, in no acute distress. HEENT: Conjunctivae and lids unremarkable. Cardiovascular: Regular rhythm.  Lungs: Normal work of breathing. Neurologic: No focal deficits.   Lab Results  Component Value  Date   CREATININE 0.78 05/22/2020   BUN 19 05/22/2020   NA 138 05/22/2020   K 4.6 05/22/2020   CL 98 05/22/2020   CO2 22 05/22/2020   Lab Results  Component Value Date   ALT 33 (H) 05/22/2020   AST 31 05/22/2020   ALKPHOS 116 05/22/2020   BILITOT 0.4 05/22/2020   Lab Results  Component Value Date   HGBA1C 5.9 (H) 05/22/2020   Lab Results  Component Value Date   INSULIN 22.1 05/22/2020   Lab Results  Component Value Date   WBC 6.2 05/22/2020   HGB 14.6 05/22/2020   HCT 44.0 05/22/2020   MCV 85 05/22/2020   PLT 186 05/22/2020   Obesity Behavioral Intervention:   Approximately 15 minutes were spent on the discussion below.  ASK: We discussed the diagnosis of obesity with Evelyn Campbell today and Evelyn Campbell agreed to give [name] permission to discuss obesity behavioral modification therapy today.  ASSESS: Evelyn Campbell has the diagnosis of obesity and her BMI today is 39.5. Evelyn Campbell is in the action stage of change.   ADVISE: Evelyn Campbell was educated on the multiple health risks of obesity as well as the benefit of weight loss to improve her health. She was advised of the need for long term treatment and the importance of lifestyle modifications to improve her current health and to decrease her risk of future health problems.  AGREE: Multiple dietary modification options and treatment options were discussed and Evelyn Campbell agreed to follow the recommendations documented in the above note.  ARRANGE: Evelyn Campbell was educated on the importance of frequent visits to treat obesity as outlined per CMS and USPSTF guidelines and agreed to schedule her next follow up appointment today.  Attestation Statements:   Reviewed by clinician on day of visit: allergies, medications, problem list, medical history, surgical history, family history, social history, and previous encounter notes.  I, Water quality scientist, CMA, am acting as transcriptionist for [name], DO  I have reviewed the above documentation for  accuracy and completeness, and I agree with the above. [name], DO

## 2021-01-12 ENCOUNTER — Encounter (INDEPENDENT_AMBULATORY_CARE_PROVIDER_SITE_OTHER): Payer: Self-pay

## 2021-01-22 ENCOUNTER — Other Ambulatory Visit: Payer: Self-pay

## 2021-01-22 ENCOUNTER — Encounter (INDEPENDENT_AMBULATORY_CARE_PROVIDER_SITE_OTHER): Payer: Self-pay | Admitting: Adult Health

## 2021-01-22 ENCOUNTER — Ambulatory Visit (INDEPENDENT_AMBULATORY_CARE_PROVIDER_SITE_OTHER): Payer: Medicare Other | Admitting: Adult Health

## 2021-01-22 VITALS — BP 134/83 | HR 62 | Temp 98.2°F | Ht 65.0 in | Wt 234.0 lb

## 2021-01-22 DIAGNOSIS — Z6839 Body mass index (BMI) 39.0-39.9, adult: Secondary | ICD-10-CM

## 2021-01-22 DIAGNOSIS — I10 Essential (primary) hypertension: Secondary | ICD-10-CM

## 2021-01-22 DIAGNOSIS — R7303 Prediabetes: Secondary | ICD-10-CM

## 2021-01-26 NOTE — Progress Notes (Signed)
Chief Complaint:   OBESITY Evelyn Campbell is here to discuss her progress with her obesity treatment plan along with follow-up of her obesity related diagnoses. Evelyn Campbell is on keeping a food journal and adhering to recommended goals of 1000 calories and 85 g protein and states she is following her eating plan approximately 95% of the time. Evelyn Campbell states she is doing cardio, bike, weights 40-45 minutes 7 times per week.  Today's visit was #: 12 Starting weight: 246 lbs Starting date: 06/05/2020 Today's weight: 234 lbs Today's date: 01/22/2021 Total lbs lost to date: 12 lbs Total lbs lost since last in-office visit: 3 lbs  Interim History: Evelyn Campbell feels more focused on tracking intake and hitting cal/protein goals both 95% of the time. She has increased her water intake to 80-90 oz. She is unable to get 100 oz a day. She has been on Metformin 500 mg QD for approximately 1 week.   Subjective:   1. Pre-diabetes Evelyn Campbell is on Metformin 500 mg QD, which she started the week of 01/12/2021. She experienced a few days of loose stools but now has normal bowel habits.  Lab Results  Component Value Date   HGBA1C 5.9 (H) 05/22/2020   Lab Results  Component Value Date   INSULIN 22.1 05/22/2020    2. Essential hypertension Evelyn Campbell's BP/HR are stable at OV. She is on amlodipine 5 mg QD and atenolol 100 mg QHS. She denies acute cardiac symptoms.  BP Readings from Last 3 Encounters:  01/22/21 134/83  01/08/21 (!) 152/82  12/25/20 115/77    Assessment/Plan:   1. Pre-diabetes Evelyn Campbell will continue to work on weight loss, exercise, and decreasing simple carbohydrates to help decrease the risk of diabetes. Continue daily Metformin. Check labs at next OV.  2. Essential hypertension Evelyn Campbell is working on healthy weight loss and exercise to improve blood pressure control. We will watch for signs of hypotension as she continues her lifestyle modifications. Continue current anti-hypertensive  regimen.  3. Class 2 severe obesity with serious comorbidity and body mass index (BMI) of 39.0 to 39.9 in adult, unspecified obesity type ([facility]) Evelyn Campbell is currently in the action stage of change. As such, her goal is to continue with weight loss efforts. She has agreed to keeping a food journal and adhering to recommended goals of 1000 calories and 85 g protein.   Check fasting labs at next OV.  Exercise goals: As is  Behavioral modification strategies: increasing lean protein intake, decreasing simple carbohydrates, meal planning and cooking strategies and planning for success.  Evelyn Campbell has agreed to follow-up with our clinic in 2 weeks. She was informed of the importance of frequent follow-up visits to maximize her success with intensive lifestyle modifications for her multiple health conditions.   Objective:   Blood pressure 134/83, pulse 62, temperature 98.2 F (36.8 C), height 5\' 5"  (1.651 m), weight 234 lb (106.1 kg), SpO2 99 %. Body mass index is 38.94 kg/m.  General: Cooperative, alert, well developed, in no acute distress. HEENT: Conjunctivae and lids unremarkable. Cardiovascular: Regular rhythm.  Lungs: Normal work of breathing. Neurologic: No focal deficits.   Lab Results  Component Value Date   CREATININE 0.78 05/22/2020   BUN 19 05/22/2020   NA 138 05/22/2020   K 4.6 05/22/2020   CL 98 05/22/2020   CO2 22 05/22/2020   Lab Results  Component Value Date   ALT 33 (H) 05/22/2020   AST 31 05/22/2020   ALKPHOS 116 05/22/2020   BILITOT 0.4 05/22/2020  Lab Results  Component Value Date   HGBA1C 5.9 (H) 05/22/2020   Lab Results  Component Value Date   INSULIN 22.1 05/22/2020   No results found for: TSH No results found for: CHOL, HDL, LDLCALC, LDLDIRECT, TRIG, CHOLHDL Lab Results  Component Value Date   WBC 6.2 05/22/2020   HGB 14.6 05/22/2020   HCT 44.0 05/22/2020   MCV 85 05/22/2020   PLT 186 05/22/2020   No results found for: IRON, TIBC,  FERRITIN   Attestation Statements:   Reviewed by clinician on day of visit: allergies, medications, problem list, medical history, surgical history, family history, social history, and previous encounter notes.  Time spent on visit including pre-visit chart review and post-visit care and charting was 31 minutes.   Coral Ceo, am acting as Location manager for Mina Marble, NP.  I have reviewed the above documentation for accuracy and completeness, and I agree with the above. -  Maryuri Warnke d. Elecia Serafin, NP-C

## 2021-02-05 ENCOUNTER — Other Ambulatory Visit (INDEPENDENT_AMBULATORY_CARE_PROVIDER_SITE_OTHER): Payer: Self-pay | Admitting: Family Medicine

## 2021-02-05 DIAGNOSIS — R7303 Prediabetes: Secondary | ICD-10-CM

## 2021-02-05 NOTE — Telephone Encounter (Signed)
Pt last seen by Katy Danford, FNP.  

## 2021-02-09 NOTE — Telephone Encounter (Signed)
Refill request

## 2021-02-17 ENCOUNTER — Other Ambulatory Visit (HOSPITAL_COMMUNITY): Payer: Self-pay | Admitting: Family Medicine

## 2021-02-17 ENCOUNTER — Other Ambulatory Visit: Payer: Self-pay

## 2021-02-17 ENCOUNTER — Ambulatory Visit (HOSPITAL_COMMUNITY)
Admission: RE | Admit: 2021-02-17 | Discharge: 2021-02-17 | Disposition: A | Discharge status: Home / Self Care | Payer: Medicare Other | Source: Ambulatory Visit | Attending: Family Medicine | Admitting: Family Medicine

## 2021-02-17 DIAGNOSIS — M7989 Other specified soft tissue disorders: Secondary | ICD-10-CM

## 2021-02-17 DIAGNOSIS — M79662 Pain in left lower leg: Secondary | ICD-10-CM

## 2021-02-17 LAB — HEPATIC FUNCTION PANEL
ALT: 104 — AB (ref 7–35)
AST: 25 (ref 13–35)
Alkaline Phosphatase: 104 (ref 25–125)
Bilirubin, Total: 0.8

## 2021-02-17 LAB — COMPREHENSIVE METABOLIC PANEL
Albumin: 4.4 (ref 3.5–5.0)
Calcium: 10 (ref 8.7–10.7)
GFR calc non Af Amer: 96

## 2021-02-17 LAB — CBC AND DIFFERENTIAL
HCT: 42 (ref 36–46)
Hemoglobin: 13.8 (ref 12.0–16.0)
Neutrophils Absolute: 3.9
Platelets: 182 (ref 150–399)
WBC: 6.7

## 2021-02-17 LAB — BASIC METABOLIC PANEL
BUN: 13 (ref 4–21)
CO2: 30 — AB (ref 13–22)
Chloride: 102 (ref 99–108)
Creatinine: 0.7 (ref 0.5–1.1)
Glucose: 88
Potassium: 4.3 (ref 3.4–5.3)
Sodium: 142 (ref 137–147)

## 2021-02-17 LAB — CBC: RBC: 4.84 (ref 3.87–5.11)

## 2021-02-17 NOTE — Progress Notes (Signed)
Lower extremity venous LT study completed.  Preliminary results relayed to voicemail of Addison Lank, MD.   See CV Proc for preliminary results report.   Darlin Coco, RDMS, RVT

## 2021-02-19 ENCOUNTER — Encounter (INDEPENDENT_AMBULATORY_CARE_PROVIDER_SITE_OTHER): Payer: Self-pay | Admitting: Family Medicine

## 2021-02-19 ENCOUNTER — Ambulatory Visit (INDEPENDENT_AMBULATORY_CARE_PROVIDER_SITE_OTHER): Payer: Medicare Other | Admitting: Family Medicine

## 2021-02-19 ENCOUNTER — Other Ambulatory Visit: Payer: Self-pay

## 2021-02-19 VITALS — BP 128/72 | HR 66 | Temp 97.7°F | Ht 65.0 in | Wt 233.0 lb

## 2021-02-19 DIAGNOSIS — E66813 Obesity, class 3: Secondary | ICD-10-CM

## 2021-02-19 DIAGNOSIS — M1A09X0 Idiopathic chronic gout, multiple sites, without tophus (tophi): Secondary | ICD-10-CM | POA: Diagnosis not present

## 2021-02-19 DIAGNOSIS — R7303 Prediabetes: Secondary | ICD-10-CM | POA: Diagnosis not present

## 2021-02-19 DIAGNOSIS — R600 Localized edema: Secondary | ICD-10-CM | POA: Diagnosis not present

## 2021-02-19 DIAGNOSIS — Z6841 Body Mass Index (BMI) 40.0 and over, adult: Secondary | ICD-10-CM

## 2021-02-23 NOTE — Progress Notes (Signed)
Chief Complaint:   OBESITY Evelyn Campbell is here to discuss her progress with her obesity treatment plan along with follow-up of her obesity related diagnoses.   Today's visit was #: 13 Starting weight: 246 lbs Starting date: 06/05/2020 Today's weight: 233 lbs Today's date: 02/19/2021 Total lbs lost to date: 13 lbs Body mass index is 38.77 kg/m.  Total weight loss percentage to date: -5.28%  Interim History:  Evelyn Campbell's left foot is swollen.  She had an ultrasound on 02/17/2021, which showed no DVT but she had swollen lymph nodes in her groin.  She recently had labs with her PCP at [facility].  Current Meal Plan: keeping a food journal and adhering to recommended goals of 1000 calories and 85 grams of protein for 90% of the time.  Current Exercise Plan: Cardio and strength training for 40 minutes 7 times per week.  Assessment/Plan:   1. Idiopathic chronic gout of multiple sites without tophus Evelyn Campbell is taking allopurinol 300 mg daily.  Plan:  Will continue to monitor symptoms as they relate to her weight loss journey.  2. Prediabetes Not at goal. Goal is HgbA1c < 5.7.  Medication: metformin 500 mg daily.    Plan:  She will continue to focus on protein-rich, low simple carbohydrate foods. We reviewed the importance of hydration, regular exercise for stress reduction, and restorative sleep.   Lab Results  Component Value Date   HGBA1C 5.9 (H) 05/22/2020   Lab Results  Component Value Date   INSULIN 22.1 05/22/2020   3. Edema of left foot Recent ultrasound on 02/17/2021, showed no DVT.  Plan:  Follow up with PCP.  Will continue to follow along.  4. Obesity, current BMI 38.8  Course: Evelyn Campbell is currently in the action stage of change. As such, her goal is to continue with weight loss efforts.   Nutrition goals: She has agreed to keeping a food journal and adhering to recommended goals of 1000 calories and 85 grams of protein.   Exercise goals: No exercise has been prescribed  at this time.  Behavioral modification strategies: increasing lean protein intake, decreasing simple carbohydrates, increasing vegetables, increasing water intake and decreasing liquid calories.  Evelyn Campbell has agreed to follow-up with our clinic in 3 weeks. She was informed of the importance of frequent follow-up visits to maximize her success with intensive lifestyle modifications for her multiple health conditions.   Objective:   Blood pressure 128/72, pulse 66, temperature 97.7 F (36.5 C), temperature source Oral, height 5\' 5"  (1.651 m), weight 233 lb (105.7 kg), SpO2 97 %. Body mass index is 38.77 kg/m.  General: Cooperative, alert, well developed, in no acute distress. HEENT: Conjunctivae and lids unremarkable. Cardiovascular: Regular rhythm.  Lungs: Normal work of breathing. Neurologic: No focal deficits.   Lab Results  Component Value Date   CREATININE 0.78 05/22/2020   BUN 19 05/22/2020   NA 138 05/22/2020   K 4.6 05/22/2020   CL 98 05/22/2020   CO2 22 05/22/2020   Lab Results  Component Value Date   ALT 33 (H) 05/22/2020   AST 31 05/22/2020   ALKPHOS 116 05/22/2020   BILITOT 0.4 05/22/2020   Lab Results  Component Value Date   HGBA1C 5.9 (H) 05/22/2020   Lab Results  Component Value Date   INSULIN 22.1 05/22/2020   Lab Results  Component Value Date   WBC 6.2 05/22/2020   HGB 14.6 05/22/2020   HCT 44.0 05/22/2020   MCV 85 05/22/2020   PLT 186 05/22/2020  Obesity Behavioral Intervention:   Approximately 15 minutes were spent on the discussion below.  ASK: We discussed the diagnosis of obesity with Evelyn Campbell today and Evelyn Campbell agreed to give [name] permission to discuss obesity behavioral modification therapy today.  ASSESS: Evelyn Campbell has the diagnosis of obesity and her BMI today is 38.8. Evelyn Campbell is in the action stage of change.   ADVISE: Evelyn Campbell was educated on the multiple health risks of obesity as well as the benefit of weight loss to improve her  health. She was advised of the need for long term treatment and the importance of lifestyle modifications to improve her current health and to decrease her risk of future health problems.  AGREE: Multiple dietary modification options and treatment options were discussed and Evelyn Campbell agreed to follow the recommendations documented in the above note.  ARRANGE: Evelyn Campbell was educated on the importance of frequent visits to treat obesity as outlined per CMS and USPSTF guidelines and agreed to schedule her next follow up appointment today.  Attestation Statements:   Reviewed by clinician on day of visit: allergies, medications, problem list, medical history, surgical history, family history, social history, and previous encounter notes.  I, Water quality scientist, CMA, am acting as transcriptionist for [name], DO  I have reviewed the above documentation for accuracy and completeness, and I agree with the above. [name], DO

## 2021-02-27 ENCOUNTER — Other Ambulatory Visit: Payer: Self-pay | Admitting: Family Medicine

## 2021-02-27 DIAGNOSIS — R590 Localized enlarged lymph nodes: Secondary | ICD-10-CM

## 2021-02-27 DIAGNOSIS — R600 Localized edema: Secondary | ICD-10-CM

## 2021-03-06 ENCOUNTER — Other Ambulatory Visit (INDEPENDENT_AMBULATORY_CARE_PROVIDER_SITE_OTHER): Payer: Self-pay | Admitting: Adult Health

## 2021-03-06 DIAGNOSIS — R7303 Prediabetes: Secondary | ICD-10-CM

## 2021-03-09 ENCOUNTER — Encounter (INDEPENDENT_AMBULATORY_CARE_PROVIDER_SITE_OTHER): Payer: Self-pay | Admitting: Adult Health

## 2021-03-09 ENCOUNTER — Ambulatory Visit (INDEPENDENT_AMBULATORY_CARE_PROVIDER_SITE_OTHER): Payer: Medicare Other | Admitting: Adult Health

## 2021-03-09 ENCOUNTER — Other Ambulatory Visit: Payer: Self-pay

## 2021-03-09 VITALS — BP 148/87 | HR 67 | Temp 98.2°F | Ht 65.0 in | Wt 235.0 lb

## 2021-03-09 DIAGNOSIS — R7303 Prediabetes: Secondary | ICD-10-CM | POA: Diagnosis not present

## 2021-03-09 DIAGNOSIS — M1A09X0 Idiopathic chronic gout, multiple sites, without tophus (tophi): Secondary | ICD-10-CM | POA: Diagnosis not present

## 2021-03-09 DIAGNOSIS — Z6839 Body mass index (BMI) 39.0-39.9, adult: Secondary | ICD-10-CM

## 2021-03-09 NOTE — Telephone Encounter (Signed)
All I saw was a Uric Acid. Were you going to order other labs? Please advise.

## 2021-03-09 NOTE — Progress Notes (Signed)
Chief Complaint:   OBESITY Evelyn Campbell is here to discuss her progress with her obesity treatment plan along with follow-up of her obesity related diagnoses. Evelyn Campbell is on keeping a food journal and adhering to recommended goals of 1100 calories and 85 g protein and states she is following her eating plan approximately 90% of the time. Evelyn Campbell states she is biking 30 minutes 7 times per week.  Today's visit was #: 14 Starting weight: 246 lbs Starting date: 06/05/2020 Today's weight: 235 lbs Today's date: 03/09/2021 Total lbs lost to date: 11 Total lbs lost since last in-office visit: 0  Interim History: Evelyn Campbell continues to experience LLE edema with pain. Natallie is scheduled for CT abd/pelvis with contrast scan of LLE.  02/17/2021 lower extremity US was negative for DVT, demonstrated swollen lymph nodes of the groin.  Today Bioimpedance reflects 7 lbs of water increase.  She denies SOB or cough. She has no hx of CHF.  Subjective:   1. Idiopathic chronic gout of multiple sites without tophus Evelyn Campbell is on allopurinol 300 mg QD and tolerating it well. She was on colchicine- PCP instructed her to stop 2 weeks ago. She would like a Uric Acid Level drawn today.  2. Pre-diabetes Evelyn Campbell took Metformin 500 mg QD for 5 weeks, then d/c'd 2 weeks ago.  Lab Results  Component Value Date   HGBA1C 5.9 (H) 05/22/2020   Lab Results  Component Value Date   INSULIN 22.1 05/22/2020    Assessment/Plan:   1. Idiopathic chronic gout of multiple sites without tophus Check uric acid level. Gout is managed by PCP.  Fax labs to PCP. - Uric acid  2. Pre-diabetes Evelyn Campbell will continue to work on weight loss, exercise, and decreasing simple carbohydrates to help decrease the risk of diabetes. Remain off Metformin. Increase protein intake.  3. Class 2 severe obesity with serious comorbidity and body mass index (BMI) of 39.0 to 39.9 in adult, unspecified obesity type (HCC)  Evelyn Campbell is currently in  the action stage of change. As such, her goal is to continue with weight loss efforts. She has agreed to keeping a food journal and adhering to recommended goals of 1000 calories and 85 g protein.   Fax uric acid level to PCP. Handout: High Protein/Low Cal Food List  Exercise goals: As is  Behavioral modification strategies: increasing lean protein intake, decreasing simple carbohydrates, meal planning and cooking strategies and planning for success.  Evelyn Campbell has agreed to follow-up with our clinic in 3 weeks. She was informed of the importance of frequent follow-up visits to maximize her success with intensive lifestyle modifications for her multiple health conditions.   Objective:   Blood pressure (!) 148/87, pulse 67, temperature 98.2 F (36.8 C), height 5\' 5"  (1.651 m), weight 235 lb (106.6 kg), SpO2 98 %. Body mass index is 39.11 kg/m.  General: Cooperative, alert, well developed, in no acute distress. HEENT: Conjunctivae and lids unremarkable. Cardiovascular: Regular rhythm.  Lungs: Normal work of breathing. Neurologic: No focal deficits.   Lab Results  Component Value Date   CREATININE 0.78 05/22/2020   BUN 19 05/22/2020   NA 138 05/22/2020   K 4.6 05/22/2020   CL 98 05/22/2020   CO2 22 05/22/2020   Lab Results  Component Value Date   ALT 33 (H) 05/22/2020   AST 31 05/22/2020   ALKPHOS 116 05/22/2020   BILITOT 0.4 05/22/2020   Lab Results  Component Value Date   HGBA1C 5.9 (H) 05/22/2020   Lab  Results  Component Value Date   INSULIN 22.1 05/22/2020   No results found for: TSH No results found for: CHOL, HDL, LDLCALC, LDLDIRECT, TRIG, CHOLHDL Lab Results  Component Value Date   WBC 6.2 05/22/2020   HGB 14.6 05/22/2020   HCT 44.0 05/22/2020   MCV 85 05/22/2020   PLT 186 05/22/2020   No results found for: IRON, TIBC, FERRITIN  Obesity Behavioral Intervention:   Approximately 15 minutes were spent on the discussion below.  ASK: We discussed the  diagnosis of obesity with Evelyn Campbell today and Evelyn Campbell agreed to give [name] permission to discuss obesity behavioral modification therapy today.  ASSESS: Evelyn Campbell has the diagnosis of obesity and her BMI today is 39.1. Evelyn Campbell is in the action stage of change.   ADVISE: Glena was educated on the multiple health risks of obesity as well as the benefit of weight loss to improve her health. She was advised of the need for long term treatment and the importance of lifestyle modifications to improve her current health and to decrease her risk of future health problems.  AGREE: Multiple dietary modification options and treatment options were discussed and Averianna agreed to follow the recommendations documented in the above note.  ARRANGE: Lizzett was educated on the importance of frequent visits to treat obesity as outlined per CMS and USPSTF guidelines and agreed to schedule her next follow up appointment today.  Attestation Statements:   Reviewed by clinician on day of visit: allergies, medications, problem list, medical history, surgical history, family history, social history, and previous encounter notes.  Coral Campbell, CMA, am acting as transcriptionist for Mina Marble, NP.  I have reviewed the above documentation for accuracy and completeness, and I agree with the above. -  Gola Bribiesca d. Sayuri Rhames, NP-C

## 2021-03-09 NOTE — Telephone Encounter (Signed)
Dr.Wallace °

## 2021-03-10 DIAGNOSIS — M1A09X0 Idiopathic chronic gout, multiple sites, without tophus (tophi): Secondary | ICD-10-CM | POA: Insufficient documentation

## 2021-03-10 LAB — URIC ACID: Uric Acid: 5.2 mg/dL (ref 3.0–7.2)

## 2021-03-10 NOTE — Progress Notes (Signed)
Faxed

## 2021-03-12 ENCOUNTER — Ambulatory Visit
Admission: RE | Admit: 2021-03-12 | Discharge: 2021-03-12 | Disposition: A | Discharge status: Home / Self Care | Payer: Medicare Other | Source: Ambulatory Visit | Attending: Family Medicine | Admitting: Family Medicine

## 2021-03-12 DIAGNOSIS — R600 Localized edema: Secondary | ICD-10-CM

## 2021-03-12 DIAGNOSIS — R590 Localized enlarged lymph nodes: Secondary | ICD-10-CM

## 2021-03-12 MED ORDER — IOPAMIDOL (ISOVUE-300) INJECTION 61%
100.0000 mL | Freq: Once | INTRAVENOUS | Status: AC | PRN
  Administered 2021-03-12: 100 mL via INTRAVENOUS

## 2021-03-13 ENCOUNTER — Other Ambulatory Visit: Payer: Medicare Other

## 2021-03-23 ENCOUNTER — Other Ambulatory Visit: Payer: Self-pay | Admitting: Neurological Surgery

## 2021-03-23 ENCOUNTER — Other Ambulatory Visit: Payer: Self-pay | Admitting: Family Medicine

## 2021-03-23 DIAGNOSIS — S065X9A Traumatic subdural hemorrhage with loss of consciousness of unspecified duration, initial encounter: Secondary | ICD-10-CM

## 2021-03-23 DIAGNOSIS — S065XAA Traumatic subdural hemorrhage with loss of consciousness status unknown, initial encounter: Secondary | ICD-10-CM

## 2021-03-23 DIAGNOSIS — N2889 Other specified disorders of kidney and ureter: Secondary | ICD-10-CM

## 2021-04-02 ENCOUNTER — Other Ambulatory Visit: Payer: Self-pay

## 2021-04-02 ENCOUNTER — Ambulatory Visit (INDEPENDENT_AMBULATORY_CARE_PROVIDER_SITE_OTHER): Payer: Medicare Other | Admitting: Family Medicine

## 2021-04-02 ENCOUNTER — Encounter (INDEPENDENT_AMBULATORY_CARE_PROVIDER_SITE_OTHER): Payer: Self-pay | Admitting: Family Medicine

## 2021-04-02 VITALS — BP 110/71 | HR 60 | Temp 98.4°F | Ht 65.0 in | Wt 235.0 lb

## 2021-04-02 DIAGNOSIS — Z6841 Body Mass Index (BMI) 40.0 and over, adult: Secondary | ICD-10-CM

## 2021-04-02 DIAGNOSIS — R7303 Prediabetes: Secondary | ICD-10-CM | POA: Diagnosis not present

## 2021-04-02 DIAGNOSIS — R600 Localized edema: Secondary | ICD-10-CM

## 2021-04-07 ENCOUNTER — Other Ambulatory Visit: Payer: Self-pay

## 2021-04-07 ENCOUNTER — Ambulatory Visit
Admission: RE | Admit: 2021-04-07 | Discharge: 2021-04-07 | Disposition: A | Discharge status: Home / Self Care | Payer: Medicare Other | Source: Ambulatory Visit | Attending: Family Medicine | Admitting: Family Medicine

## 2021-04-07 DIAGNOSIS — N2889 Other specified disorders of kidney and ureter: Secondary | ICD-10-CM

## 2021-04-07 MED ORDER — GADOBENATE DIMEGLUMINE 529 MG/ML IV SOLN
20.0000 mL | Freq: Once | INTRAVENOUS | Status: AC | PRN
  Administered 2021-04-07: 20 mL via INTRAVENOUS

## 2021-04-13 NOTE — Progress Notes (Signed)
Chief Complaint:   OBESITY Evelyn Campbell is here to discuss her progress with her obesity treatment plan along with follow-up of her obesity related diagnoses.   Today's visit was #: 15 Starting weight: 246 lbs Starting date: 06/05/2020 Today's weight: 235 lbs Today's date: 04/02/2021 Weight change since last visit: 0 Total lbs lost to date: 11 lbs Body mass index is 39.11 kg/m.  Total weight loss percentage to date: -4.47%  Interim History:  Evelyn Campbell says her left leg is still swollen and she has some discomfort.  She has an upcoming MRI. Current Meal Plan: keeping a food journal and adhering to recommended goals of 1000 calories and 85 grams of protein for 90% of the time.  Current Exercise Plan: Cardio for 40 minutes 7 times per week.  Assessment/Plan:   1. Prediabetes Not at goal. Goal is HgbA1c < 5.7.  Medication: None.    Plan:  She will continue to focus on protein-rich, low simple carbohydrate foods. We reviewed the importance of hydration, regular exercise for stress reduction, and restorative sleep.   Lab Results  Component Value Date   HGBA1C 5.9 (H) 05/22/2020   Lab Results  Component Value Date   INSULIN 22.1 05/22/2020   2. Edema of left lower extremity Evelyn Campbell is scheduled for an MRI next week. We will continue to monitor symptoms as they relate to her weight loss journey.  3. Obesity, current BMI 39.2  Course: Evelyn Campbell is currently in the action stage of change. As such, her goal is to continue with weight loss efforts.   Nutrition goals: She has agreed to keeping a food journal and adhering to recommended goals of 1000 calories and 85 grams of protein.   Exercise goals:  As is.  Behavioral modification strategies: increasing lean protein intake, decreasing simple carbohydrates, and increasing vegetables.  Evelyn Campbell has agreed to follow-up with our clinic in 3-4 weeks. She was informed of the importance of frequent follow-up visits to maximize her success  with intensive lifestyle modifications for her multiple health conditions.   Objective:   Blood pressure 110/71, pulse 60, temperature 98.4 F (36.9 C), temperature source Oral, height 5\' 5"  (1.651 m), weight 235 lb (106.6 kg), SpO2 98 %. Body mass index is 39.11 kg/m.  General: Cooperative, alert, well developed, in no acute distress. HEENT: Conjunctivae and lids unremarkable. Cardiovascular: Regular rhythm.  Lungs: Normal work of breathing. Neurologic: No focal deficits.   Lab Results  Component Value Date   CREATININE 0.78 05/22/2020   BUN 19 05/22/2020   NA 138 05/22/2020   K 4.6 05/22/2020   CL 98 05/22/2020   CO2 22 05/22/2020   Lab Results  Component Value Date   ALT 33 (H) 05/22/2020   AST 31 05/22/2020   ALKPHOS 116 05/22/2020   BILITOT 0.4 05/22/2020   Lab Results  Component Value Date   HGBA1C 5.9 (H) 05/22/2020   Lab Results  Component Value Date   INSULIN 22.1 05/22/2020   Lab Results  Component Value Date   WBC 6.2 05/22/2020   HGB 14.6 05/22/2020   HCT 44.0 05/22/2020   MCV 85 05/22/2020   PLT 186 05/22/2020   Obesity Behavioral Intervention:   Approximately 15 minutes were spent on the discussion below.  ASK: We discussed the diagnosis of obesity with Evelyn Campbell today and Evelyn Campbell agreed to give [name] permission to discuss obesity behavioral modification therapy today.  ASSESS: Evelyn Campbell has the diagnosis of obesity and her BMI today is 39.2. Evelyn Campbell is in  the action stage of change.   ADVISE: Evelyn Campbell was educated on the multiple health risks of obesity as well as the benefit of weight loss to improve her health. She was advised of the need for long term treatment and the importance of lifestyle modifications to improve her current health and to decrease her risk of future health problems.  AGREE: Multiple dietary modification options and treatment options were discussed and Evelyn Campbell agreed to follow the recommendations documented in the above  note.  ARRANGE: Evelyn Campbell was educated on the importance of frequent visits to treat obesity as outlined per CMS and USPSTF guidelines and agreed to schedule her next follow up appointment today.  Attestation Statements:   Reviewed by clinician on day of visit: allergies, medications, problem list, medical history, surgical history, family history, social history, and previous encounter notes.  I, Water quality scientist, CMA, am acting as transcriptionist for [name], DO  I have reviewed the above documentation for accuracy and completeness, and I agree with the above. [name], DO

## 2021-04-16 ENCOUNTER — Ambulatory Visit (INDEPENDENT_AMBULATORY_CARE_PROVIDER_SITE_OTHER): Payer: Medicare Other | Admitting: Adult Health

## 2021-04-16 ENCOUNTER — Other Ambulatory Visit: Payer: Self-pay

## 2021-04-16 ENCOUNTER — Encounter (INDEPENDENT_AMBULATORY_CARE_PROVIDER_SITE_OTHER): Payer: Self-pay | Admitting: Adult Health

## 2021-04-16 ENCOUNTER — Telehealth (INDEPENDENT_AMBULATORY_CARE_PROVIDER_SITE_OTHER): Payer: Self-pay

## 2021-04-16 VITALS — BP 103/70 | HR 59 | Temp 97.5°F | Ht 65.0 in | Wt 237.0 lb

## 2021-04-16 DIAGNOSIS — Z6839 Body mass index (BMI) 39.0-39.9, adult: Secondary | ICD-10-CM

## 2021-04-16 DIAGNOSIS — I10 Essential (primary) hypertension: Secondary | ICD-10-CM | POA: Diagnosis not present

## 2021-04-16 DIAGNOSIS — R600 Localized edema: Secondary | ICD-10-CM

## 2021-04-16 MED ORDER — AMLODIPINE BESYLATE 5 MG PO TABS: 5.0000 mg | ORAL_TABLET | Freq: Every day | ORAL | 0 refills | Status: DC

## 2021-04-20 NOTE — Progress Notes (Addendum)
Chief Complaint:   OBESITY Evelyn Campbell is here to discuss her progress with her obesity treatment plan along with follow-up of her obesity related diagnoses. Evelyn Campbell is on keeping a food journal and adhering to recommended goals of 1000 calories and 85 g protein and states she is following her eating plan approximately 90% of the time. Evelyn Campbell states she is using stationary bike 36-40 minutes 7 times per week.  Today's visit was #: 73 Starting weight: 246 lbs Starting date: 06/05/2020 Today's weight: 237 lbs Today's date: 04/16/2021 Total lbs lost to date: 9 Total lbs lost since last in-office visit: 0  Interim History: Evelyn Campbell felt that she was consistently following journaling plan until 4 days ago.  After Monday, she reports polyphagia and increased snacking.  Interval goal: lose down to under 230 lbs, current weight 237. Of Note: last labs with PCP 01/2021. Will repeat 06/2021 with PCP.  Subjective:   1. Essential hypertension Evelyn Campbell's ambulatory reading are questionable due to wrist cuff. BP/HR stable at OV. LLE edema started 2 months ago. She denies acute cardiac symptoms.   BP Readings from Last 3 Encounters:  04/16/21 103/70  04/02/21 110/71  03/09/21 (!) 148/87   2. Edema of left lower extremity Evelyn Campbell has an upcoming appointment with Vascular Specialist to address continuing LLE edema.  Assessment/Plan:   1. Essential hypertension Shakeia is working on healthy weight loss and exercise to improve blood pressure control. We will watch for signs of hypotension as she continues her lifestyle modifications.  - amLODipine (NORVASC) 5 MG tablet; Take 1 tablet (5 mg total) by mouth daily.  Dispense: 90 tablet; Refill: 0  2. Edema of left lower extremity Elevate lower extremities frequently.  3. Class 2 severe obesity with serious comorbidity and body mass index (BMI) of 39.0 to 39.9 in adult, unspecified obesity type (HCC)  Evelyn Campbell is currently in the action stage of  change. As such, her goal is to continue with weight loss efforts. She has agreed to keeping a food journal and adhering to recommended goals of 1000 calories and 85 g protein.   Use MyFitnessPal  Exercise goals:  As is  Behavioral modification strategies: increasing lean protein intake, decreasing simple carbohydrates, increasing water intake, meal planning and cooking strategies, keeping healthy foods in the home, and planning for success.  Evelyn Campbell has agreed to follow-up with our clinic in 4 weeks. She was informed of the importance of frequent follow-up visits to maximize her success with intensive lifestyle modifications for her multiple health conditions.   Objective:   Blood pressure 103/70, pulse (!) 59, temperature (!) 97.5 F (36.4 C), height 5\' 5"  (1.651 m), weight 237 lb (107.5 kg), SpO2 98 %. Body mass index is 39.44 kg/m.  General: Cooperative, alert, well developed, in no acute distress. HEENT: Conjunctivae and lids unremarkable. Cardiovascular: Regular rhythm.  Lungs: Normal work of breathing. Neurologic: No focal deficits.   Lab Results  Component Value Date   CREATININE 0.78 05/22/2020   BUN 19 05/22/2020   NA 138 05/22/2020   K 4.6 05/22/2020   CL 98 05/22/2020   CO2 22 05/22/2020   Lab Results  Component Value Date   ALT 33 (H) 05/22/2020   AST 31 05/22/2020   ALKPHOS 116 05/22/2020   BILITOT 0.4 05/22/2020   Lab Results  Component Value Date   HGBA1C 5.9 (H) 05/22/2020   Lab Results  Component Value Date   INSULIN 22.1 05/22/2020   No results found for: TSH No results  found for: CHOL, HDL, LDLCALC, LDLDIRECT, TRIG, CHOLHDL Lab Results  Component Value Date   WBC 6.2 05/22/2020   HGB 14.6 05/22/2020   HCT 44.0 05/22/2020   MCV 85 05/22/2020   PLT 186 05/22/2020   No results found for: IRON, TIBC, FERRITIN  Obesity Behavioral Intervention:   Approximately 15 minutes were spent on the discussion below.  ASK: We discussed the diagnosis  of obesity with Evelyn Campbell today and Evelyn Campbell agreed to give [name] permission to discuss obesity behavioral modification therapy today.  ASSESS: Evelyn Campbell has the diagnosis of obesity and her BMI today is 39.4. Evelyn Campbell is in the action stage of change.   ADVISE: Evelyn Campbell was educated on the multiple health risks of obesity as well as the benefit of weight loss to improve her health. She was advised of the need for long term treatment and the importance of lifestyle modifications to improve her current health and to decrease her risk of future health problems.  AGREE: Multiple dietary modification options and treatment options were discussed and Evelyn Campbell agreed to follow the recommendations documented in the above note.  ARRANGE: Evelyn Campbell was educated on the importance of frequent visits to treat obesity as outlined per CMS and USPSTF guidelines and agreed to schedule her next follow up appointment today.  Attestation Statements:   Reviewed by clinician on day of visit: allergies, medications, problem list, medical history, surgical history, family history, social history, and previous encounter notes.  Coral Ceo, CMA, am acting as transcriptionist for Mina Marble, NP.  I have reviewed the above documentation for accuracy and completeness, and I agree with the above. -  Sahily Biddle d. Mehek Grega, NP-C

## 2021-04-21 DIAGNOSIS — R600 Localized edema: Secondary | ICD-10-CM | POA: Insufficient documentation

## 2021-04-21 DIAGNOSIS — I890 Lymphedema, not elsewhere classified: Secondary | ICD-10-CM | POA: Insufficient documentation

## 2021-04-30 ENCOUNTER — Other Ambulatory Visit (INDEPENDENT_AMBULATORY_CARE_PROVIDER_SITE_OTHER): Payer: Self-pay | Admitting: Family Medicine

## 2021-05-14 ENCOUNTER — Ambulatory Visit (INDEPENDENT_AMBULATORY_CARE_PROVIDER_SITE_OTHER): Payer: Medicare Other | Admitting: Family Medicine

## 2021-05-19 ENCOUNTER — Ambulatory Visit (INDEPENDENT_AMBULATORY_CARE_PROVIDER_SITE_OTHER): Payer: Medicare Other | Admitting: Family Medicine

## 2021-06-02 ENCOUNTER — Encounter (INDEPENDENT_AMBULATORY_CARE_PROVIDER_SITE_OTHER): Payer: Self-pay

## 2021-06-04 ENCOUNTER — Ambulatory Visit (INDEPENDENT_AMBULATORY_CARE_PROVIDER_SITE_OTHER): Payer: Medicare Other | Admitting: Adult Health

## 2021-06-12 ENCOUNTER — Telehealth: Payer: Self-pay | Admitting: Cardiology

## 2021-06-12 NOTE — Telephone Encounter (Signed)
To Dr Marlou Porch for recommendations.

## 2021-06-12 NOTE — Telephone Encounter (Signed)
New Message:       Patient said Dr Marlou Porch had recommended years ago that she take Centrum Heart Vitamins. She said it is so hard for her to find it at this time.Marland Kitchen She wants him to please recommend something else that is similar and have some of the same indrgents in it please.

## 2021-06-15 NOTE — Telephone Encounter (Signed)
Lm to cb on pt's VM.

## 2021-06-15 NOTE — Telephone Encounter (Signed)
Spoke with pt and reviewed Dr Marlou Porch recommendation.  She states understanding and had no further questions at the time of the call.

## 2021-06-15 NOTE — Telephone Encounter (Signed)
Pt is returning call.  

## 2021-07-02 ENCOUNTER — Other Ambulatory Visit: Payer: Self-pay

## 2021-07-02 ENCOUNTER — Encounter (INDEPENDENT_AMBULATORY_CARE_PROVIDER_SITE_OTHER): Payer: Self-pay | Admitting: Adult Health

## 2021-07-02 ENCOUNTER — Ambulatory Visit (INDEPENDENT_AMBULATORY_CARE_PROVIDER_SITE_OTHER): Payer: Medicare Other | Admitting: Adult Health

## 2021-07-02 VITALS — BP 134/81 | HR 65 | Temp 97.6°F | Ht 65.0 in | Wt 230.0 lb

## 2021-07-02 DIAGNOSIS — I890 Lymphedema, not elsewhere classified: Secondary | ICD-10-CM | POA: Diagnosis not present

## 2021-07-02 DIAGNOSIS — Z6841 Body Mass Index (BMI) 40.0 and over, adult: Secondary | ICD-10-CM

## 2021-07-02 DIAGNOSIS — I10 Essential (primary) hypertension: Secondary | ICD-10-CM | POA: Diagnosis not present

## 2021-07-07 NOTE — Progress Notes (Signed)
Chief Complaint:   OBESITY Evelyn Campbell is here to discuss her progress with her obesity treatment plan along with follow-up of her obesity related diagnoses. Kadance is on keeping a food journal and adhering to recommended goals of 1000 calories and 85 grams of protein and states she is following her eating plan approximately 100% of the time. Bettejane states she is biking/walking for 30-36 minutes 7 times per week.  Today's visit was #: 26 Starting weight: 246 lbs Starting date: 06/05/2020 Today's weight: 230 lbs Today's date: 07/02/2021 Total lbs lost to date: 16 lbs Total lbs lost since last in-office visit: 7 lbs  Interim History: Kristianna's interval goal is to lose down to 225 lbs, current weight 230 lbs. She worked really hard over the last several weeks with 100% compliance of tracking and hitting calorie/protein goals and daily exercise - stationary bike or walking.  Subjective:   1. Lymphedema of left leg LLE lymphedema - recently seen by [facility] Vascular Specialist, Dr. Haynes Kerns.  2. Essential hypertension BP slightly elevated at office visit.  She denies acute cardiac symptoms.  She is on amlodipine 5 mg daily.  Assessment/Plan:   1. Lymphedema of left leg Follow-up with lymphedema therapist.  Follow-up with vascular specialist.   2. Essential hypertension Continue amlodipine 5 mg daily.  No need for refill.  3. Obesity, current BMI 38.3  Naiya is currently in the action stage of change. As such, her goal is to continue with weight loss efforts. She has agreed to keeping a food journal and adhering to recommended goals of 1000 calories and 85 grams of protein and practicing portion control and making smarter food choices, such as increasing vegetables and decreasing simple carbohydrates.   Exercise goals:  As is.  Behavioral modification strategies: increasing lean protein intake, decreasing simple carbohydrates, meal planning and cooking strategies, keeping healthy foods  in the home, planning for success, and keeping a strict food journal.  Handout:  High Protein/Low Calorie Food List.  Protein Content of Food.  Saprina has agreed to follow-up with our clinic in 3 weeks. She was informed of the importance of frequent follow-up visits to maximize her success with intensive lifestyle modifications for her multiple health conditions.   Objective:   Blood pressure 134/81, pulse 65, temperature 97.6 F (36.4 C), height '5\' 5"'$  (1.651 m), weight 230 lb (104.3 kg), SpO2 97 %. Body mass index is 38.27 kg/m.  General: Cooperative, alert, well developed, in no acute distress. HEENT: Conjunctivae and lids unremarkable. Cardiovascular: Regular rhythm.  Lungs: Normal work of breathing. Neurologic: No focal deficits.   Lab Results  Component Value Date   CREATININE 0.7 02/17/2021   BUN 13 02/17/2021   NA 142 02/17/2021   K 4.3 02/17/2021   CL 102 02/17/2021   CO2 30 (A) 02/17/2021   Lab Results  Component Value Date   ALT 104 (A) 02/17/2021   AST 25 02/17/2021   ALKPHOS 104 02/17/2021   BILITOT 0.4 05/22/2020   Lab Results  Component Value Date   HGBA1C 5.9 (H) 05/22/2020   Lab Results  Component Value Date   INSULIN 22.1 05/22/2020   Lab Results  Component Value Date   CHOL 225 (A) 12/26/2020   HDL 65 12/26/2020   LDLCALC 124 12/26/2020   TRIG 207 (A) 12/26/2020   Lab Results  Component Value Date   VD25OH 29.3 (L) 05/22/2020   Lab Results  Component Value Date   WBC 6.7 02/17/2021   HGB 13.8 02/17/2021  HCT 42 02/17/2021   MCV 85 05/22/2020   PLT 182 02/17/2021   Attestation Statements:   Reviewed by clinician on day of visit: allergies, medications, problem list, medical history, surgical history, family history, social history, and previous encounter notes.  Time spent on visit including pre-visit chart review and post-visit care and charting was 28 minutes.   I, Water quality scientist, CMA, am acting as Location manager for Mina Marble, NP.  I have reviewed the above documentation for accuracy and completeness, and I agree with the above. -  Perlita Forbush d. Tera Pellicane, NP-C

## 2021-07-08 ENCOUNTER — Ambulatory Visit (HOSPITAL_COMMUNITY): Payer: Medicare Other | Admitting: Physical Therapy

## 2021-07-21 NOTE — Telephone Encounter (Signed)
error 

## 2021-07-22 ENCOUNTER — Ambulatory Visit (HOSPITAL_COMMUNITY): Payer: Medicare Other | Attending: Vascular Surgery | Admitting: Physical Therapy

## 2021-07-22 ENCOUNTER — Other Ambulatory Visit: Payer: Self-pay

## 2021-07-22 DIAGNOSIS — I890 Lymphedema, not elsewhere classified: Secondary | ICD-10-CM | POA: Insufficient documentation

## 2021-07-22 NOTE — Therapy (Signed)
[facility] [address] [place], [place], [address] Phone: [phone]   Fax:  [phone]  Physical Therapy Evaluation  Patient Details  Name: Evelyn Campbell MRN: [id number] Date of Birth: [birth year] Referring Provider (PT): Loma Boston   Encounter Date: 07/22/2021   PT End of Session - 07/22/21 1522     Visit Number 1    Number of Visits 12    Date for PT Re-Evaluation 08/21/21    Authorization Type [facility]/ [employer]    Progress Note Due on Visit 10    PT Start Time 1406    PT Stop Time 1520    PT Time Calculation (min) 74 min    Activity Tolerance Patient tolerated treatment well    Behavior During Therapy [facility] for tasks assessed/performed             Past Medical History:  Diagnosis Date   Arthritis    R ankle    Back pain    Chronic kidney disease    seen by Nephrology at [facility], due to all her numbers were"off", she  remarks that she was sent to superhydrate & then her labs values corrected.  Pt. will f/u /w nephrology   Constipation    Fatty liver    Gallbladder problem    GERD (gastroesophageal reflux disease)    occ   Gout    Hyperlipidemia    Hypertension    Joint pain    Lower extremity edema    Obesity    Peripheral neuropathy    Pneumonia 1980's   hx- not needed hospitalization    Vitamin D deficiency     Past Surgical History:  Procedure Laterality Date   ABDOMINAL HYSTERECTOMY  99   ANKLE FUSION Right 6/15   CHOLECYSTECTOMY  10   DENTAL SURGERY     implant- upper R    HERNIA REPAIR     INCISIONAL HERNIA REPAIR N/A 07/02/2015   Procedure: LAPAROSCOPIC INCISIONAL HERNIA;  Surgeon: Coralie Keens, MD;  Location: [facility];  Service: General;  Laterality: N/A;   INGUINAL HERNIA REPAIR Bilateral 08/20/2014   Procedure: LAPAROSCOPIC BILATERAL INGUINAL HERNIA REPAIR WITH MESH;  Surgeon: Coralie Keens, MD;  Location: [place];  Service: General;  Laterality: Bilateral;   INSERTION OF MESH N/A 08/20/2014    Procedure: INSERTION OF MESH;  Surgeon: Coralie Keens, MD;  Location: [place];  Service: General;  Laterality: N/A;   INSERTION OF MESH N/A 07/02/2015   Procedure: INSERTION OF MESH;  Surgeon: Coralie Keens, MD;  Location: [place];  Service: General;  Laterality: N/A;   PANCREATECTOMY N/A 08/20/2014   Procedure: LAPAROSCOPIC ADRENALECTOMY;  Surgeon: Stark Klein, MD;  Location: [place];  Service: General;  Laterality: N/A;   TOE SURGERY Bilateral 2012   hammer toe   TONSILLECTOMY     UMBILICAL HERNIA REPAIR N/A 08/20/2014   Procedure: HERNIA REPAIR UMBILICAL ADULT;  Surgeon: Coralie Keens, MD;  Location: [place];  Service: General;  Laterality: N/A;   VAGINAL DELIVERY  1994   WRIST SURGERY      There were no vitals filed for this visit.    Subjective Assessment - 07/22/21 1412     Subjective Ms. Berling states that around 02/16/2021 she noticed that her Left leg was swelling.  She hs noticed that the leg swelling has gotten worse,  She went to her primary sent her to vein and heart to make sure that it wasn't a blood clot but the doppler was negative.  She was sent to Dr Jones Skene who was a vein specialist, he performed tests and got the pt set up with a lymphapress. She states that she was told to complete three hours a day every day.  The machine is set at 17mmhg, it does seem to help. She was then referred to an MD at [facility] by Dr. Jones Skene.   She is to follow up with this MD in January.    Pertinent History Rt ankle reconstruction, kidney disease, obesity, fatty liver dz    Patient Stated Goals LT leg swelling to be less    Currently in Pain? Yes    Pain Score --   at least a 20   Pain Location Leg    Pain Orientation Left    Pain Descriptors / Indicators Tightness;Constant    Pain Type Chronic pain    Pain Onset More than a month ago    Pain Frequency Constant    Aggravating Factors  not sure    Pain Relieving Factors tylenol    Effect of Pain on Daily Activities limits                 [facility] PT Assessment - 07/22/21 0001       Assessment   Medical Diagnosis LT LE lymphedema    Referring Provider (PT) Loma Boston    Onset Date/Surgical Date 02/16/21    Next MD Visit January    Prior Therapy none      Precautions   Precautions --   cellulitis     Restrictions   Weight Bearing Restrictions No      Balance Screen   Has the patient fallen in the past 6 months No    Has the patient had a decrease in activity level because of a fear of falling?  Yes    Is the patient reluctant to leave their home because of a fear of falling?  No               LYMPHEDEMA/ONCOLOGY QUESTIONNAIRE - 07/22/21 0001       What other symptoms do you have   Are you Having Heaviness or Tightness Yes    Are you having Pain Yes    Are you having pitting edema Yes    Body Site foot , leg    Is it Hard or Difficult finding clothes that fit Yes    Do you have infections --   no   Is there Decreased scar mobility No      Lymphedema Stage   Stage STAGE 2 SPONTANEOUSLY IRREVERSIBLE      Right Lower Extremity Lymphedema   20 cm Proximal to Suprapatella 73.7 cm    10 cm Proximal to Suprapatella 65 cm    At Midpatella/Popliteal Crease 53.3 cm    30 cm Proximal to Floor at Lateral Plantar Foot 43 cm    20 cm Proximal to Floor at Lateral Plantar Foot 30.1 1    10  cm Proximal to Floor at Lateral Malleoli 25.3 cm    Circumference of ankle/heel 35.5 cm.    5 cm Proximal to 1st MTP Joint 23.5 cm    Across MTP Joint 23.7 cm    Other pt has gout in Rt to      Left Lower Extremity Lymphedema   20 cm Proximal to Suprapatella 71.8 cm    10 cm Proximal to Suprapatella 77.8 cm    At Midpatella/Popliteal Crease 59 cm    30 cm Proximal  to Floor at Lateral Plantar Foot 48.8 cm    20 cm Proximal to Floor at Lateral Plantar Foot 36.7 cm    10 cm Proximal to Floor at Lateral Malleoli 30.8 cm    Circumference of ankle/heel 39 cm.    5 cm Proximal to 1st MTP Joint 27.3 cm     Across MTP Joint 25.5 cm    Around Proximal Great Toe 8.3 cm                     Objective measurements completed on examination: See above findings.       [facility] Adult PT Treatment/Exercise - 07/22/21 0001       Manual Therapy   Manual Therapy Manual Lymphatic Drainage (MLD);Compression Bandaging    Manual therapy comments completed seperate from all other aspects of treatment    Compression Bandaging foam cut for LT LE                     PT Education - 07/22/21 1520     Education Details What lymphedema is, that it can not be cured, the four aspects of treatment, the need to wear compression garment everyday following treatment.    Person(s) Educated Patient    Methods Explanation    Comprehension Verbalized understanding              PT Short Term Goals - 07/22/21 1647       PT SHORT TERM GOAL #1   Title Pt to be I in HEP to improve lymphatic circulation    Time 2    Period Weeks    Status New    Target Date 08/05/21      PT SHORT TERM GOAL #2   Title PT to have lost 2 cm measurement from knee down in her Lt LE to reduce pain to no greater than a 6/10    Time 2    Period Weeks    Status New               PT Long Term Goals - 07/22/21 1653       PT LONG TERM GOAL #1   Title PT to be I in self massaging to improve lymphatic flow.    Time 4    Period Weeks    Status New    Target Date 08/19/21      PT LONG TERM GOAL #2   Title PT to have lost between 3-4 cm at measurement from knee down on Lt LE to reduce pain to no greater than a 3/10 to be able to complete her Chi Tia standing without difficulty again.    Time 4    Period Weeks    Status New      PT LONG TERM GOAL #3   Title Pt to have obtained and to be able to don compression garment    Time 4    Period Weeks                    Plan - 07/22/21 1523     Clinical Impression Statement Ms. Ballen is a 65 yo female who has been diagnosed and referred to  this clinic for lymphedema and lymphedem treatment respectively.  Evaluation demonstrates increased pain and swelling most likely a lipolymphedema combination.  The pt will benefit from total decongestive techniques and education on how to control her edema.    Personal Factors and Comorbidities Fitness;Comorbidity 3+  Comorbidities gout, hx of ankle fusion, HTN, peripheral neuropathy    Examination-Activity Limitations Dressing;Locomotion Level;Stand    Examination-Participation Restrictions Cleaning;Laundry;Shop;Community Activity    Stability/Clinical Decision Making Evolving/Moderate complexity    Clinical Decision Making Moderate    Rehab Potential Good    PT Frequency 3x / week    PT Duration 4 weeks    PT Treatment/Interventions Therapeutic exercise;Patient/family education;Compression bandaging;Manual lymph drainage    PT Next Visit Plan begin total decongestive techniques, compression bandaging to Lt LE             Patient will benefit from skilled therapeutic intervention in order to improve the following deficits and impairments:  Decreased activity tolerance, Increased edema, Obesity, Pain  Visit Diagnosis: Lymphedema, not elsewhere classified     Problem List Patient Active Problem List   Diagnosis Date Noted   Lymphedema of left leg 04/21/2021   Idiopathic chronic gout of multiple sites without tophus 03/10/2021   Diverticular disease of colon 01/08/2021   Idiopathic peripheral neuropathy 01/08/2021   Mixed hyperlipidemia 01/08/2021   Peripheral venous insufficiency 01/08/2021   Personal history of colonic polyps 01/08/2021   Vitamin D deficiency 82/99/3716   Metabolic syndrome 96/78/9381   Prediabetes 01/08/2021   Visceral obesity 12/25/2020   Degenerative arthritis of right ankle 10/05/2016   Incisional hernia 07/02/2015   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

## 2021-07-23 NOTE — Addendum Note (Signed)
Addended by: Leeroy Cha on: 07/23/2021 11:31 AM   Modules accepted: Orders

## 2021-07-23 NOTE — Addendum Note (Signed)
Addended by: Leeroy Cha on: 07/23/2021 12:56 PM   Modules accepted: Orders

## 2021-07-23 NOTE — Progress Notes (Signed)
   07/22/21 0001  What other symptoms do you have  Are you Having Heaviness or Tightness Yes  Are you having Pain Yes  Are you having pitting edema Yes  Body Site foot , leg  Is it Hard or Difficult finding clothes that fit Yes  Do you have infections  (no)  Is there Decreased scar mobility No  Lymphedema Stage  Stage STAGE 2 SPONTANEOUSLY IRREVERSIBLE  Right Lower Extremity Lymphedema  20 cm Proximal to Suprapatella 73.7 cm  10 cm Proximal to Suprapatella 65 cm  At Midpatella/Popliteal Crease 53.3 cm  30 cm Proximal to Floor at Lateral Plantar Foot 43 cm  20 cm Proximal to Floor at Lateral Plantar Foot 30.1 1  10  cm Proximal to Floor at Lateral Malleoli 25.3 cm  Circumference of ankle/heel 35.5 cm.  5 cm Proximal to 1st MTP Joint 23.5 cm  Across MTP Joint 23.7 cm  Other pt has gout in Rt to  Left Lower Extremity Lymphedema  20 cm Proximal to Suprapatella 71.8 cm  10 cm Proximal to Suprapatella 77.8 cm  At Midpatella/Popliteal Crease 59 cm  30 cm Proximal to Floor at Lateral Plantar Foot 48.8 cm  20 cm Proximal to Floor at Lateral Plantar Foot 36.7 cm  10 cm Proximal to Floor at Lateral Malleoli 30.8 cm  Circumference of ankle/heel 39 cm.  5 cm Proximal to 1st MTP Joint 27.3 cm  Across MTP Joint 25.5 cm  Around Proximal Great Toe 8.3 cm

## 2021-07-24 ENCOUNTER — Ambulatory Visit (HOSPITAL_COMMUNITY): Payer: Medicare Other | Admitting: Physical Therapy

## 2021-07-24 ENCOUNTER — Other Ambulatory Visit: Payer: Self-pay

## 2021-07-24 DIAGNOSIS — I890 Lymphedema, not elsewhere classified: Secondary | ICD-10-CM

## 2021-07-24 NOTE — Therapy (Signed)
[facility] [place], [place], [address] Phone: [phone]   Fax:  [phone]  Physical Therapy Treatment  Patient Details  Name: Evelyn Campbell MRN: [id number] Date of Birth: 1956/07/31 Referring Provider (PT): Loma Boston   Encounter Date: 07/24/2021   PT End of Session - 07/24/21 1524     Visit Number 2    Number of Visits 12    Date for PT Re-Evaluation 08/21/21    Authorization Type [facility]/ [employer]    Authorization - Visit Number 2    Progress Note Due on Visit 10    PT Start Time 1408   pt late   PT Stop Time 1510    PT Time Calculation (min) 62 min    Activity Tolerance Patient tolerated treatment well    Behavior During Therapy [facility] for tasks assessed/performed             Past Medical History:  Diagnosis Date   Arthritis    R ankle    Back pain    Chronic kidney disease    seen by Nephrology at [facility], due to all her numbers were"off", she  remarks that she was sent to superhydrate & then her labs values corrected.  Pt. will f/u /w nephrology   Constipation    Fatty liver    Gallbladder problem    GERD (gastroesophageal reflux disease)    occ   Gout    Hyperlipidemia    Hypertension    Joint pain    Lower extremity edema    Obesity    Peripheral neuropathy    Pneumonia 1980's   hx- not needed hospitalization    Vitamin D deficiency     Past Surgical History:  Procedure Laterality Date   ABDOMINAL HYSTERECTOMY  99   ANKLE FUSION Right 6/15   CHOLECYSTECTOMY  10   DENTAL SURGERY     implant- upper R    HERNIA REPAIR     INCISIONAL HERNIA REPAIR N/A 07/02/2015   Procedure: LAPAROSCOPIC INCISIONAL HERNIA;  Surgeon: Coralie Keens, MD;  Location: [place];  Service: General;  Laterality: N/A;   INGUINAL HERNIA REPAIR Bilateral 08/20/2014   Procedure: LAPAROSCOPIC BILATERAL INGUINAL HERNIA REPAIR WITH MESH;  Surgeon: Coralie Keens, MD;  Location: [place];  Service: General;  Laterality:  Bilateral;   INSERTION OF MESH N/A 08/20/2014   Procedure: INSERTION OF MESH;  Surgeon: Coralie Keens, MD;  Location: [place];  Service: General;  Laterality: N/A;   INSERTION OF MESH N/A 07/02/2015   Procedure: INSERTION OF MESH;  Surgeon: Coralie Keens, MD;  Location: [place];  Service: General;  Laterality: N/A;   PANCREATECTOMY N/A 08/20/2014   Procedure: LAPAROSCOPIC ADRENALECTOMY;  Surgeon: Stark Klein, MD;  Location: [place];  Service: General;  Laterality: N/A;   TOE SURGERY Bilateral 2012   hammer toe   TONSILLECTOMY     UMBILICAL HERNIA REPAIR N/A 08/20/2014   Procedure: HERNIA REPAIR UMBILICAL ADULT;  Surgeon: Coralie Keens, MD;  Location: [place];  Service: General;  Laterality: N/A;   VAGINAL DELIVERY  1994   WRIST SURGERY      There were no vitals filed for this visit.   Subjective Assessment - 07/24/21 1520     Subjective PT states that she is ready to get this started.    Pertinent History Rt ankle reconstruction, kidney disease, obesity, fatty liver dz    Patient Stated Goals LT leg swelling to be less    Currently in  Pain? Yes    Pain Score 5     Pain Location Leg    Pain Orientation Left    Pain Descriptors / Indicators Pressure;Tightness    Pain Type Chronic pain    Pain Onset More than a month ago    Pain Frequency Constant    Aggravating Factors  not sure    Pain Relieving Factors elevate    Effect of Pain on Daily Activities limits                               [facility] Adult PT Treatment/Exercise - 07/24/21 0001       Manual Therapy   Manual Therapy Manual Lymphatic Drainage (MLD);Compression Bandaging    Manual therapy comments completed seperate from all other aspects of treatment    Manual Lymphatic Drainage (MLD) To include supraclavicular, deep and supeficial abdominal, routing inguinal to axillary Fb anterioar and postrerior legs B with legs done prone.    Compression Bandaging for Lt only from foot to LE using 1/2 inch foam  and multilayer short stretch bandaging.                       PT Short Term Goals - 07/24/21 1526       PT SHORT TERM GOAL #1   Title Pt to be I in HEP to improve lymphatic circulation    Time 2    Period Weeks    Status On-going    Target Date 08/05/21      PT SHORT TERM GOAL #2   Title PT to have lost 2 cm measurement from knee down in her Lt LE to reduce pain to no greater than a 6/10    Time 2    Period Weeks    Status On-going               PT Long Term Goals - 07/24/21 1527       PT LONG TERM GOAL #1   Title PT to be I in self massaging to improve lymphatic flow.    Time 4    Period Weeks    Status On-going      PT LONG TERM GOAL #2   Title PT to have lost between 3-4 cm at measurement from knee down on Lt LE to reduce pain to no greater than a 3/10 to be able to complete her Chi Tia standing without difficulty again.    Time 4    Period Weeks    Status On-going      PT LONG TERM GOAL #3   Title Pt to have obtained and to be able to don compression garment    Time 4    Period Weeks    Status On-going                   Plan - 07/24/21 1525     Clinical Impression Statement Reviewed HEP.  Began total decongestive techniques.  Completed manual to B but bandaging to Lt leg only.  Bandaging to knee level to allow pt to get use to bandaging but will be completed to thigh level next week.    Personal Factors and Comorbidities Fitness;Comorbidity 3+    Comorbidities gout, hx of ankle fusion, HTN, peripheral neuropathy    Examination-Activity Limitations Dressing;Locomotion Level;Stand    Examination-Participation Restrictions Cleaning;Laundry;Shop;Community Activity    Stability/Clinical Decision Making Evolving/Moderate complexity  Clinical Decision Making Moderate    Rehab Potential Good    PT Frequency 3x / week    PT Duration 4 weeks    PT Treatment/Interventions Therapeutic exercise;Patient/family education;Compression  bandaging;Manual lymph drainage    PT Next Visit Plan Begin bandaging on Lt LE to thigh level.  Begin instruction for self massage.             Patient will benefit from skilled therapeutic intervention in order to improve the following deficits and impairments:  Decreased activity tolerance, Increased edema, Obesity, Pain  Visit Diagnosis: Lymphedema, not elsewhere classified     Problem List Patient Active Problem List   Diagnosis Date Noted   Lymphedema of left leg 04/21/2021   Idiopathic chronic gout of multiple sites without tophus 03/10/2021   Diverticular disease of colon 01/08/2021   Idiopathic peripheral neuropathy 01/08/2021   Mixed hyperlipidemia 01/08/2021   Peripheral venous insufficiency 01/08/2021   Personal history of colonic polyps 01/08/2021   Vitamin D deficiency 17/79/3903   Metabolic syndrome 00/92/3300   Prediabetes 01/08/2021   Visceral obesity 12/25/2020   Degenerative arthritis of right ankle 10/05/2016   Incisional hernia 07/02/2015   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

## 2021-07-27 ENCOUNTER — Ambulatory Visit (HOSPITAL_COMMUNITY): Payer: Medicare Other | Admitting: Physical Therapy

## 2021-07-27 ENCOUNTER — Other Ambulatory Visit: Payer: Self-pay

## 2021-07-27 ENCOUNTER — Encounter (HOSPITAL_COMMUNITY): Payer: Self-pay | Admitting: Physical Therapy

## 2021-07-27 DIAGNOSIS — I890 Lymphedema, not elsewhere classified: Secondary | ICD-10-CM | POA: Diagnosis not present

## 2021-07-27 NOTE — Therapy (Signed)
[place], [place], [address] Phone: [phone]   Fax:  [phone]  Physical Therapy Treatment  Patient Details  Name: Evelyn Campbell MRN: [id number] Date of Birth: Jan 17, 1956 Referring Provider (PT): Loma Boston   Encounter Date: 07/27/2021   PT End of Session - 07/27/21 1532     Visit Number 3    Number of Visits 12    Date for PT Re-Evaluation 08/21/21    Authorization Type MC/ [employer]    Authorization - Visit Number 3    Progress Note Due on Visit 10    PT Start Time 1400    PT Stop Time 1510    PT Time Calculation (min) 70 min    Activity Tolerance Patient tolerated treatment well    Behavior During Therapy [facility] for tasks assessed/performed             Past Medical History:  Diagnosis Date   Arthritis    R ankle    Back pain    Chronic kidney disease    seen by Nephrology at [facility], due to all her numbers were"off", she  remarks that she was sent to superhydrate & then her labs values corrected.  Pt. will f/u /w nephrology   Constipation    Fatty liver    Gallbladder problem    GERD (gastroesophageal reflux disease)    occ   Gout    Hyperlipidemia    Hypertension    Joint pain    Lower extremity edema    Obesity    Peripheral neuropathy    Pneumonia 1980's   hx- not needed hospitalization    Vitamin D deficiency     Past Surgical History:  Procedure Laterality Date   ABDOMINAL HYSTERECTOMY  99   ANKLE FUSION Right 6/15   CHOLECYSTECTOMY  10   DENTAL SURGERY     implant- upper R    HERNIA REPAIR     INCISIONAL HERNIA REPAIR N/A 07/02/2015   Procedure: LAPAROSCOPIC INCISIONAL HERNIA;  Surgeon: Coralie Keens, MD;  Location: [place];  Service: General;  Laterality: N/A;   INGUINAL HERNIA REPAIR Bilateral 08/20/2014   Procedure: LAPAROSCOPIC BILATERAL INGUINAL HERNIA REPAIR WITH MESH;  Surgeon: Coralie Keens, MD;  Location: [place];  Service: General;  Laterality: Bilateral;    INSERTION OF MESH N/A 08/20/2014   Procedure: INSERTION OF MESH;  Surgeon: Coralie Keens, MD;  Location: [place];  Service: General;  Laterality: N/A;   INSERTION OF MESH N/A 07/02/2015   Procedure: INSERTION OF MESH;  Surgeon: Coralie Keens, MD;  Location: [place];  Service: General;  Laterality: N/A;   PANCREATECTOMY N/A 08/20/2014   Procedure: LAPAROSCOPIC ADRENALECTOMY;  Surgeon: Stark Klein, MD;  Location: [place];  Service: General;  Laterality: N/A;   TOE SURGERY Bilateral 2012   hammer toe   TONSILLECTOMY     UMBILICAL HERNIA REPAIR N/A 08/20/2014   Procedure: HERNIA REPAIR UMBILICAL ADULT;  Surgeon: Coralie Keens, MD;  Location: [place];  Service: General;  Laterality: N/A;   VAGINAL DELIVERY  1994   WRIST SURGERY      There were no vitals filed for this visit.   Subjective Assessment - 07/27/21 1529     Subjective Pt states that she is doing well.  The bandages stayed up    Pertinent History Rt ankle reconstruction, kidney disease, obesity, fatty liver dz    Patient Stated Goals LT leg swelling to be less    Pain Onset More  than a month ago                               [facility] Adult PT Treatment/Exercise - 07/27/21 0001       Manual Therapy   Manual Therapy Manual Lymphatic Drainage (MLD);Compression Bandaging    Manual therapy comments completed seperate from all other aspects of treatment    Manual Lymphatic Drainage (MLD) To include supraclavicular, deep and supeficial abdominal, routing inguinal to axillary Fb anterioar and postrerior legs B with legs done prone.    Compression Bandaging foam cut for Lt thigh; compression bandage from toes to thigh on Lt LE using 1/2 " foam and multilayer compression bandaging.                       PT Short Term Goals - 07/24/21 1526       PT SHORT TERM GOAL #1   Title Pt to be I in HEP to improve lymphatic circulation    Time 2    Period Weeks    Status On-going    Target Date 08/05/21       PT SHORT TERM GOAL #2   Title PT to have lost 2 cm measurement from knee down in her Lt LE to reduce pain to no greater than a 6/10    Time 2    Period Weeks    Status On-going               PT Long Term Goals - 07/24/21 1527       PT LONG TERM GOAL #1   Title PT to be I in self massaging to improve lymphatic flow.    Time 4    Period Weeks    Status On-going      PT LONG TERM GOAL #2   Title PT to have lost between 3-4 cm at measurement from knee down on Lt LE to reduce pain to no greater than a 3/10 to be able to complete her Chi Tia standing without difficulty again.    Time 4    Period Weeks    Status On-going      PT LONG TERM GOAL #3   Title Pt to have obtained and to be able to don compression garment    Time 4    Period Weeks    Status On-going                   Plan - 07/27/21 1533     Clinical Impression Statement Completed total decongestive techniques to Lt LE ; manual to B noted reduction in Lt LE    Personal Factors and Comorbidities Fitness;Comorbidity 3+    Comorbidities gout, hx of ankle fusion, HTN, peripheral neuropathy    Examination-Activity Limitations Dressing;Locomotion Level;Stand    Examination-Participation Restrictions Cleaning;Laundry;Shop;Community Activity    Stability/Clinical Decision Making Evolving/Moderate complexity    Rehab Potential Good    PT Frequency 3x / week    PT Duration 4 weeks    PT Treatment/Interventions Therapeutic exercise;Patient/family education;Compression bandaging;Manual lymph drainage    PT Next Visit Plan .  Begin instruction for self massage.  Measure on WEd.             Patient will benefit from skilled therapeutic intervention in order to improve the following deficits and impairments:  Decreased activity tolerance, Increased edema, Obesity, Pain  Visit Diagnosis: Lymphedema, not elsewhere classified  Problem List Patient Active Problem List   Diagnosis Date Noted    Lymphedema of left leg 04/21/2021   Idiopathic chronic gout of multiple sites without tophus 03/10/2021   Diverticular disease of colon 01/08/2021   Idiopathic peripheral neuropathy 01/08/2021   Mixed hyperlipidemia 01/08/2021   Peripheral venous insufficiency 01/08/2021   Personal history of colonic polyps 01/08/2021   Vitamin D deficiency 40/98/1191   Metabolic syndrome 47/82/9562   Prediabetes 01/08/2021   Visceral obesity 12/25/2020   Degenerative arthritis of right ankle 10/05/2016   Incisional hernia 07/02/2015   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

## 2021-07-29 ENCOUNTER — Encounter (HOSPITAL_COMMUNITY): Payer: Self-pay

## 2021-07-29 ENCOUNTER — Ambulatory Visit (HOSPITAL_COMMUNITY): Payer: Medicare Other

## 2021-07-29 ENCOUNTER — Other Ambulatory Visit: Payer: Self-pay

## 2021-07-29 DIAGNOSIS — I890 Lymphedema, not elsewhere classified: Secondary | ICD-10-CM

## 2021-07-29 NOTE — Therapy (Signed)
[place], [place], [address] Phone: [phone]   Fax:  [phone]  Physical Therapy Treatment  Patient Details  Name: Evelyn Campbell MRN: [id number] Date of Birth: [birth year] Referring Provider (PT): Loma Boston   Encounter Date: 07/29/2021   PT End of Session - 07/29/21 1556     Visit Number 4    Number of Visits 12    Date for PT Re-Evaluation 08/21/21    Authorization Type [employer]/ [employer]    Authorization - Visit Number 4    Progress Note Due on Visit 10    PT Start Time 1448    PT Stop Time 1556    PT Time Calculation (min) 68 min    Activity Tolerance Patient tolerated treatment well    Behavior During Therapy [facility] for tasks assessed/performed             Past Medical History:  Diagnosis Date   Arthritis    R ankle    Back pain    Chronic kidney disease    seen by Nephrology at [facility], due to all her numbers were"off", she  remarks that she was sent to superhydrate & then her labs values corrected.  Pt. will f/u /w nephrology   Constipation    Fatty liver    Gallbladder problem    GERD (gastroesophageal reflux disease)    occ   Gout    Hyperlipidemia    Hypertension    Joint pain    Lower extremity edema    Obesity    Peripheral neuropathy    Pneumonia 1980's   hx- not needed hospitalization    Vitamin D deficiency     Past Surgical History:  Procedure Laterality Date   ABDOMINAL HYSTERECTOMY  99   ANKLE FUSION Right 6/15   CHOLECYSTECTOMY  10   DENTAL SURGERY     implant- upper R    HERNIA REPAIR     INCISIONAL HERNIA REPAIR N/A 07/02/2015   Procedure: LAPAROSCOPIC INCISIONAL HERNIA;  Surgeon: Coralie Keens, MD;  Location: [place];  Service: General;  Laterality: N/A;   INGUINAL HERNIA REPAIR Bilateral 08/20/2014   Procedure: LAPAROSCOPIC BILATERAL INGUINAL HERNIA REPAIR WITH MESH;  Surgeon: Coralie Keens, MD;  Location: [place];  Service: General;  Laterality: Bilateral;    INSERTION OF MESH N/A 08/20/2014   Procedure: INSERTION OF MESH;  Surgeon: Coralie Keens, MD;  Location: [place];  Service: General;  Laterality: N/A;   INSERTION OF MESH N/A 07/02/2015   Procedure: INSERTION OF MESH;  Surgeon: Coralie Keens, MD;  Location: [place];  Service: General;  Laterality: N/A;   PANCREATECTOMY N/A 08/20/2014   Procedure: LAPAROSCOPIC ADRENALECTOMY;  Surgeon: Stark Klein, MD;  Location: [place];  Service: General;  Laterality: N/A;   TOE SURGERY Bilateral 2012   hammer toe   TONSILLECTOMY     UMBILICAL HERNIA REPAIR N/A 08/20/2014   Procedure: HERNIA REPAIR UMBILICAL ADULT;  Surgeon: Coralie Keens, MD;  Location: [place];  Service: General;  Laterality: N/A;   VAGINAL DELIVERY  1994   WRIST SURGERY      There were no vitals filed for this visit.   Subjective Assessment - 07/29/21 1500     Subjective Reports she notices reduction from ankle to knee, less in thighs.  Reports ankle feels tight    Currently in Pain? Yes    Pain Score 3     Pain Location Ankle    Pain Orientation Left  Pain Descriptors / Indicators Aching;Sore;Tightness    Pain Type Chronic pain    Pain Onset More than a month ago    Pain Frequency Constant                               [facility] Adult PT Treatment/Exercise - 07/29/21 0001       Manual Therapy   Manual Therapy Manual Lymphatic Drainage (MLD);Compression Bandaging;Other (comment)    Manual therapy comments completed seperate from all other aspects of treatment    Manual Lymphatic Drainage (MLD) To include supraclavicular, deep and supeficial abdominal, routing inguinal to axillary Fb anterioar and postrerior legs B with legs done prone.    Compression Bandaging Lt thigh high multilayer short stretch bandages with 1/2in foam    Other Manual Therapy measurements taken, instructed self care techniques.                     PT Education - 07/29/21 1604     Education Details Instructed self  care manual lymphedema    Person(s) Educated Patient    Methods Explanation;Demonstration    Comprehension Verbalized understanding;Need further instruction              PT Short Term Goals - 07/24/21 1526       PT SHORT TERM GOAL #1   Title Pt to be I in HEP to improve lymphatic circulation    Time 2    Period Weeks    Status On-going    Target Date 08/05/21      PT SHORT TERM GOAL #2   Title PT to have lost 2 cm measurement from knee down in her Lt LE to reduce pain to no greater than a 6/10    Time 2    Period Weeks    Status On-going               PT Long Term Goals - 07/24/21 1527       PT LONG TERM GOAL #1   Title PT to be I in self massaging to improve lymphatic flow.    Time 4    Period Weeks    Status On-going      PT LONG TERM GOAL #2   Title PT to have lost between 3-4 cm at measurement from knee down on Lt LE to reduce pain to no greater than a 3/10 to be able to complete her Chi Tia standing without difficulty again.    Time 4    Period Weeks    Status On-going      PT LONG TERM GOAL #3   Title Pt to have obtained and to be able to don compression garment    Time 4    Period Weeks    Status On-going                   Plan - 07/29/21 1722     Clinical Impression Statement Measurements taken.  Manual decongestive techniques complete to Lt LE anterior only due to time restraints.  Educated self manual techniques with printout given, encouraged pt to stretch skin vs increased pressure to musculature.  Review manual technqiues next session.    Personal Factors and Comorbidities Fitness;Comorbidity 3+    Comorbidities gout, hx of ankle fusion, HTN, peripheral neuropathy    Examination-Activity Limitations Dressing;Locomotion Level;Stand    Examination-Participation Restrictions Cleaning;Laundry;Shop;Community Activity    Stability/Clinical Decision Making Evolving/Moderate  complexity    Clinical Decision Making Moderate    Rehab  Potential Good    PT Frequency 3x / week    PT Duration 4 weeks    PT Treatment/Interventions Therapeutic exercise;Patient/family education;Compression bandaging;Manual lymph drainage    PT Next Visit Plan Continue manual and bandages to Lt LE.  Measure on Wednesday.  Review self manual next session.             Patient will benefit from skilled therapeutic intervention in order to improve the following deficits and impairments:  Decreased activity tolerance, Increased edema, Obesity, Pain  Visit Diagnosis: Lymphedema, not elsewhere classified     Problem List Patient Active Problem List   Diagnosis Date Noted   Lymphedema of left leg 04/21/2021   Idiopathic chronic gout of multiple sites without tophus 03/10/2021   Diverticular disease of colon 01/08/2021   Idiopathic peripheral neuropathy 01/08/2021   Mixed hyperlipidemia 01/08/2021   Peripheral venous insufficiency 01/08/2021   Personal history of colonic polyps 01/08/2021   Vitamin D deficiency 72/07/4708   Metabolic syndrome 62/83/6629   Prediabetes 01/08/2021   Visceral obesity 12/25/2020   Degenerative arthritis of right ankle 10/05/2016   Incisional hernia 07/02/2015   Left adrenal mass ([place]) 08/20/2014   Essential hypertension 06/14/2014   Family history of ischemic heart disease 06/14/2014   Class 3 severe obesity with serious comorbidity and body mass index (BMI) of 40.0 to 44.9 in adult [facility]) 06/14/2014   Status post ankle arthrodesis 05/07/2014   Ihor Austin, LPTA/CLT; CBIS [phone]  Aldona Lento, PTA 07/29/2021, 5:27 PM  [place], [place], [address] Phone: [phone]   Fax:  [phone]  Name: Evelyn Campbell MRN: [id number] Date of Birth: June 03, 1956

## 2021-07-31 ENCOUNTER — Encounter (HOSPITAL_COMMUNITY): Payer: Self-pay

## 2021-07-31 ENCOUNTER — Other Ambulatory Visit: Payer: Self-pay

## 2021-07-31 ENCOUNTER — Ambulatory Visit (HOSPITAL_COMMUNITY): Payer: Medicare Other

## 2021-07-31 DIAGNOSIS — I890 Lymphedema, not elsewhere classified: Secondary | ICD-10-CM | POA: Diagnosis not present

## 2021-07-31 NOTE — Therapy (Signed)
[name], [place], [address] Phone: [phone]   Fax:  [phone]  Physical Therapy Treatment  Patient Details  Name: Evelyn Campbell MRN: [id number] Date of Birth: 1956-03-27 Referring Provider (PT): Loma Boston   Encounter Date: 07/31/2021   PT End of Session - 07/31/21 1636     Visit Number 5    Number of Visits 12    Date for PT Re-Evaluation 08/21/21    Authorization Type [facility]/ [employer]    Authorization - Visit Number 5    Progress Note Due on Visit 10    PT Start Time 1453    PT Stop Time 1600    PT Time Calculation (min) 67 min    Activity Tolerance Patient tolerated treatment well    Behavior During Therapy [facility] for tasks assessed/performed             Past Medical History:  Diagnosis Date   Arthritis    R ankle    Back pain    Chronic kidney disease    seen by Nephrology at [facility], due to all her numbers were"off", she  remarks that she was sent to superhydrate & then her labs values corrected.  Pt. will f/u /w nephrology   Constipation    Fatty liver    Gallbladder problem    GERD (gastroesophageal reflux disease)    occ   Gout    Hyperlipidemia    Hypertension    Joint pain    Lower extremity edema    Obesity    Peripheral neuropathy    Pneumonia 1980's   hx- not needed hospitalization    Vitamin D deficiency     Past Surgical History:  Procedure Laterality Date   ABDOMINAL HYSTERECTOMY  99   ANKLE FUSION Right 6/15   CHOLECYSTECTOMY  10   DENTAL SURGERY     implant- upper R    HERNIA REPAIR     INCISIONAL HERNIA REPAIR N/A 07/02/2015   Procedure: LAPAROSCOPIC INCISIONAL HERNIA;  Surgeon: Coralie Keens, MD;  Location: [place];  Service: General;  Laterality: N/A;   INGUINAL HERNIA REPAIR Bilateral 08/20/2014   Procedure: LAPAROSCOPIC BILATERAL INGUINAL HERNIA REPAIR WITH MESH;  Surgeon: Coralie Keens, MD;  Location: [place];  Service: General;  Laterality: Bilateral;    INSERTION OF MESH N/A 08/20/2014   Procedure: INSERTION OF MESH;  Surgeon: Coralie Keens, MD;  Location: [place];  Service: General;  Laterality: N/A;   INSERTION OF MESH N/A 07/02/2015   Procedure: INSERTION OF MESH;  Surgeon: Coralie Keens, MD;  Location: [place];  Service: General;  Laterality: N/A;   PANCREATECTOMY N/A 08/20/2014   Procedure: LAPAROSCOPIC ADRENALECTOMY;  Surgeon: Stark Klein, MD;  Location: [place];  Service: General;  Laterality: N/A;   TOE SURGERY Bilateral 2012   hammer toe   TONSILLECTOMY     UMBILICAL HERNIA REPAIR N/A 08/20/2014   Procedure: HERNIA REPAIR UMBILICAL ADULT;  Surgeon: Coralie Keens, MD;  Location: [place];  Service: General;  Laterality: N/A;   VAGINAL DELIVERY  1994   WRIST SURGERY      There were no vitals filed for this visit.   Subjective Assessment - 07/31/21 1608     Subjective Reports improvements with change in lymph liner ankle less irritation.  Continues to feel tight.    Pertinent History Rt ankle reconstruction, kidney disease, obesity, fatty liver dz    Patient Stated Goals LT leg swelling to be less  Currently in Pain? Yes    Pain Score 3     Pain Location Ankle    Pain Orientation Left    Pain Descriptors / Indicators Aching;Sore;Tightness    Pain Type Chronic pain    Pain Onset More than a month ago    Pain Frequency Constant    Aggravating Factors  not sure    Pain Relieving Factors elevate    Effect of Pain on Daily Activities limits                   LYMPHEDEMA/ONCOLOGY QUESTIONNAIRE - 07/31/21 0001       Right Lower Extremity Lymphedema   20 cm Proximal to Suprapatella --   on eval 9/21 was: 76.7   10 cm Proximal to Suprapatella --   on eval 9/21 was: 65   At Midpatella/Popliteal Crease --   on eval 9/21 was: 53.3   30 cm Proximal to Floor at Lateral Plantar Foot --   on eval 9/21 was: 43   20 cm Proximal to Floor at Lateral Plantar Foot --   on eval 9/21 was: 30.1   10 cm Proximal to Floor at  Lateral Malleoli --   on eval 9/21 was: 25.3   Circumference of ankle/heel --   on eval 9/21 was: 35.5   5 cm Proximal to 1st MTP Joint --   on eval 9/21 was: 23.5   Across MTP Joint --   on eval 9/21 was: 23.7     Left Lower Extremity Lymphedema   20 cm Proximal to Suprapatella 71.4 cm   on eval 9/21 was: 71.8   10 cm Proximal to Suprapatella 67 cm   on eval 9/21 was: 77.8   At Midpatella/Popliteal Crease 56.6 cm   on eval 9/21 was: 59   30 cm Proximal to Floor at Lateral Plantar Foot 44.2 cm   on eval 9/21 was: 48.8   20 cm Proximal to Floor at Lateral Plantar Foot 31.2 cm   on eval 9/21 was: 36.7   10 cm Proximal to Floor at Lateral Malleoli 25.6 cm   on eval 9/21 was: 30.8   Circumference of ankle/heel 36 cm.   on eval 9/21 was: 39   5 cm Proximal to 1st MTP Joint 26.5 cm   on eval 9/21 was: 27.3   Across MTP Joint 23.8 cm   on eval 9/21 was: 25.5   Around Proximal Great Toe 8 cm   on eval 9/21 was: 8.3                       [facility] Adult PT Treatment/Exercise - 07/31/21 0001       Manual Therapy   Manual Therapy Manual Lymphatic Drainage (MLD);Compression Bandaging;Other (comment)    Manual therapy comments completed seperate from all other aspects of treatment    Manual Lymphatic Drainage (MLD) To include supraclavicular, deep and supeficial abdominal, routing inguinal to axillary Fb anterioar and postrerior legs B with legs done prone.    Compression Bandaging Lt thigh high multilayer short stretch bandages with 1/2in foam    Other Manual Therapy measurements taken                       PT Short Term Goals - 07/24/21 1526       PT SHORT TERM GOAL #1   Title Pt to be I in HEP to improve lymphatic circulation    Time 2  Period Weeks    Status On-going    Target Date 08/05/21      PT SHORT TERM GOAL #2   Title PT to have lost 2 cm measurement from knee down in her Lt LE to reduce pain to no greater than a 6/10    Time 2    Period Weeks     Status On-going               PT Long Term Goals - 07/24/21 1527       PT LONG TERM GOAL #1   Title PT to be I in self massaging to improve lymphatic flow.    Time 4    Period Weeks    Status On-going      PT LONG TERM GOAL #2   Title PT to have lost between 3-4 cm at measurement from knee down on Lt LE to reduce pain to no greater than a 3/10 to be able to complete her Chi Tia standing without difficulty again.    Time 4    Period Weeks    Status On-going      PT LONG TERM GOAL #3   Title Pt to have obtained and to be able to don compression garment    Time 4    Period Weeks    Status On-going                   Plan - 07/31/21 1637     Clinical Impression Statement Manual decongestive techniques complete to Lt LE anterior and posterior.  Pt reoprts less irritation on ankle with change to softer fabric liner, continue in future.  Reviewed self manual and encouraged to reduce pressure for stretching skin.  Reports of comfort at EOS.    Personal Factors and Comorbidities Fitness;Comorbidity 3+    Comorbidities gout, hx of ankle fusion, HTN, peripheral neuropathy    Examination-Activity Limitations Dressing;Locomotion Level;Stand    Examination-Participation Restrictions Cleaning;Laundry;Shop;Community Activity    Stability/Clinical Decision Making Evolving/Moderate complexity    Clinical Decision Making Moderate    Rehab Potential Good    PT Frequency 3x / week    PT Duration 4 weeks    PT Treatment/Interventions Therapeutic exercise;Patient/family education;Compression bandaging;Manual lymph drainage    PT Next Visit Plan Continue manual and bandages to Lt LE.  Pt likes the softer fabric liner distal LE, used 12 cm.  Measure on Wednesday.  Review self manual.    Consulted and Agree with Plan of Care Patient             Patient will benefit from skilled therapeutic intervention in order to improve the following deficits and impairments:  Decreased activity  tolerance, Increased edema, Obesity, Pain  Visit Diagnosis: Lymphedema, not elsewhere classified     Problem List Patient Active Problem List   Diagnosis Date Noted   Lymphedema of left leg 04/21/2021   Idiopathic chronic gout of multiple sites without tophus 03/10/2021   Diverticular disease of colon 01/08/2021   Idiopathic peripheral neuropathy 01/08/2021   Mixed hyperlipidemia 01/08/2021   Peripheral venous insufficiency 01/08/2021   Personal history of colonic polyps 01/08/2021   Vitamin D deficiency 16/60/6301   Metabolic syndrome 60/08/9322   Prediabetes 01/08/2021   Visceral obesity 12/25/2020   Degenerative arthritis of right ankle 10/05/2016   Incisional hernia 07/02/2015   Left adrenal mass ([place]) 08/20/2014   Essential hypertension 06/14/2014   Family history of ischemic heart disease 06/14/2014   Class 3 severe obesity with  serious comorbidity and body mass index (BMI) of 40.0 to 44.9 in adult [facility]) 06/14/2014   Status post ankle arthrodesis 05/07/2014   Ihor Austin, LPTA/CLT; CBIS [phone]  Aldona Lento, PTA 07/31/2021, 4:40 PM  [place] [address] [place], [place], [address] Phone: [phone]   Fax:  [phone]  Name: Kimarie Coor MRN: [id number] Date of Birth: 18-Aug-1956

## 2021-08-03 ENCOUNTER — Other Ambulatory Visit: Payer: Self-pay

## 2021-08-03 ENCOUNTER — Ambulatory Visit (INDEPENDENT_AMBULATORY_CARE_PROVIDER_SITE_OTHER): Payer: Medicare Other | Admitting: Adult Health

## 2021-08-03 ENCOUNTER — Ambulatory Visit (HOSPITAL_COMMUNITY): Payer: Medicare Other | Attending: Vascular Surgery | Admitting: Physical Therapy

## 2021-08-03 DIAGNOSIS — I890 Lymphedema, not elsewhere classified: Secondary | ICD-10-CM | POA: Insufficient documentation

## 2021-08-03 NOTE — Therapy (Signed)
[facility] [address] [place], [place], [address] Phone: [phone]   Fax:  [phone]  Physical Therapy Treatment  Patient Details  Name: Evelyn Campbell MRN: [id number] Date of Birth: [birth year] Referring Provider (PT): Loma Boston   Encounter Date: 08/03/2021   PT End of Session - 08/03/21 1614     Visit Number 6    Number of Visits 12    Date for PT Re-Evaluation 08/21/21    Authorization Type MC/ [employer]    Authorization - Visit Number 6    Progress Note Due on Visit 10    PT Start Time 1450    PT Stop Time 1600    PT Time Calculation (min) 70 min    Activity Tolerance Patient tolerated treatment well    Behavior During Therapy [facility] for tasks assessed/performed             Past Medical History:  Diagnosis Date   Arthritis    R ankle    Back pain    Chronic kidney disease    seen by Nephrology at [facility], due to all her numbers were"off", she  remarks that she was sent to superhydrate & then her labs values corrected.  Pt. will f/u /w nephrology   Constipation    Fatty liver    Gallbladder problem    GERD (gastroesophageal reflux disease)    occ   Gout    Hyperlipidemia    Hypertension    Joint pain    Lower extremity edema    Obesity    Peripheral neuropathy    Pneumonia 1980's   hx- not needed hospitalization    Vitamin D deficiency     Past Surgical History:  Procedure Laterality Date   ABDOMINAL HYSTERECTOMY  99   ANKLE FUSION Right 6/15   CHOLECYSTECTOMY  10   DENTAL SURGERY     implant- upper R    HERNIA REPAIR     INCISIONAL HERNIA REPAIR N/A 07/02/2015   Procedure: LAPAROSCOPIC INCISIONAL HERNIA;  Surgeon: Coralie Keens, MD;  Location: [place];  Service: General;  Laterality: N/A;   INGUINAL HERNIA REPAIR Bilateral 08/20/2014   Procedure: LAPAROSCOPIC BILATERAL INGUINAL HERNIA REPAIR WITH MESH;  Surgeon: Coralie Keens, MD;  Location: [place];  Service: General;  Laterality: Bilateral;    INSERTION OF MESH N/A 08/20/2014   Procedure: INSERTION OF MESH;  Surgeon: Coralie Keens, MD;  Location: [place];  Service: General;  Laterality: N/A;   INSERTION OF MESH N/A 07/02/2015   Procedure: INSERTION OF MESH;  Surgeon: Coralie Keens, MD;  Location: [place];  Service: General;  Laterality: N/A;   PANCREATECTOMY N/A 08/20/2014   Procedure: LAPAROSCOPIC ADRENALECTOMY;  Surgeon: Stark Klein, MD;  Location: [place];  Service: General;  Laterality: N/A;   TOE SURGERY Bilateral 2012   hammer toe   TONSILLECTOMY     UMBILICAL HERNIA REPAIR N/A 08/20/2014   Procedure: HERNIA REPAIR UMBILICAL ADULT;  Surgeon: Coralie Keens, MD;  Location: [place];  Service: General;  Laterality: N/A;   VAGINAL DELIVERY  1994   WRIST SURGERY      There were no vitals filed for this visit.   Subjective Assessment - 08/03/21 1614     Subjective pt states she is doing well today.  States it started sliding down the second day.    Currently in Pain? No/denies  [facility] Adult PT Treatment/Exercise - 08/03/21 0001       Manual Therapy   Manual Therapy Manual Lymphatic Drainage (MLD);Compression Bandaging;Other (comment)    Manual therapy comments completed seperate from all other aspects of treatment    Manual Lymphatic Drainage (MLD) To include supraclavicular, deep and supeficial abdominal, routing inguinal to axillary Fb anterioar and postrerior legs B with legs done prone.    Compression Bandaging Lt thigh high multilayer short stretch bandages with 1/2in foam                       PT Short Term Goals - 07/24/21 1526       PT SHORT TERM GOAL #1   Title Pt to be I in HEP to improve lymphatic circulation    Time 2    Period Weeks    Status On-going    Target Date 08/05/21      PT SHORT TERM GOAL #2   Title PT to have lost 2 cm measurement from knee down in her Lt LE to reduce pain to no greater than a 6/10    Time 2    Period  Weeks    Status On-going               PT Long Term Goals - 07/24/21 1527       PT LONG TERM GOAL #1   Title PT to be I in self massaging to improve lymphatic flow.    Time 4    Period Weeks    Status On-going      PT LONG TERM GOAL #2   Title PT to have lost between 3-4 cm at measurement from knee down on Lt LE to reduce pain to no greater than a 3/10 to be able to complete her Chi Tia standing without difficulty again.    Time 4    Period Weeks    Status On-going      PT LONG TERM GOAL #3   Title Pt to have obtained and to be able to don compression garment    Time 4    Period Weeks    Status On-going                   Plan - 08/03/21 1615     Clinical Impression Statement Continued with manual and bandaging to full Lt LE.  Added more cotton to lower leg to help keep upper from sliding down overtop the smaller limb.  PT reported overall comfort with bandaging at end of session.    Personal Factors and Comorbidities Fitness;Comorbidity 3+    Comorbidities gout, hx of ankle fusion, HTN, peripheral neuropathy    Examination-Activity Limitations Dressing;Locomotion Level;Stand    Examination-Participation Restrictions Cleaning;Laundry;Shop;Community Activity    Stability/Clinical Decision Making Evolving/Moderate complexity    Rehab Potential Good    PT Frequency 3x / week    PT Duration 4 weeks    PT Treatment/Interventions Therapeutic exercise;Patient/family education;Compression bandaging;Manual lymph drainage    PT Next Visit Plan Continue manual and bandages to Lt LE.  Pt likes the softer fabric liner distal LE, used 10 cm.  Measure on Wednesday.    Consulted and Agree with Plan of Care Patient             Patient will benefit from skilled therapeutic intervention in order to improve the following deficits and impairments:  Decreased activity tolerance, Increased edema, Obesity, Pain  Visit Diagnosis: Lymphedema, not elsewhere  classified  Problem List Patient Active Problem List   Diagnosis Date Noted   Lymphedema of left leg 04/21/2021   Idiopathic chronic gout of multiple sites without tophus 03/10/2021   Diverticular disease of colon 01/08/2021   Idiopathic peripheral neuropathy 01/08/2021   Mixed hyperlipidemia 01/08/2021   Peripheral venous insufficiency 01/08/2021   Personal history of colonic polyps 01/08/2021   Vitamin D deficiency 37/29/0211   Metabolic syndrome 15/52/0802   Prediabetes 01/08/2021   Visceral obesity 12/25/2020   Degenerative arthritis of right ankle 10/05/2016   Incisional hernia 07/02/2015   Left adrenal mass ([place]) 08/20/2014   Essential hypertension 06/14/2014   Family history of ischemic heart disease 06/14/2014   Class 3 severe obesity with serious comorbidity and body mass index (BMI) of 40.0 to 44.9 in adult [facility]) 06/14/2014   Status post ankle arthrodesis 05/07/2014   Teena Irani, PTA/CLT, WTA [phone]  Teena Irani, PTA 08/03/2021, 4:17 PM  [place], [place], [address] Phone: [phone]   Fax:  [phone]  Name: Evelyn Campbell MRN: [id number] Date of Birth: November 21, 1955

## 2021-08-05 ENCOUNTER — Other Ambulatory Visit: Payer: Self-pay

## 2021-08-05 ENCOUNTER — Ambulatory Visit (HOSPITAL_COMMUNITY): Payer: Medicare Other | Admitting: Physical Therapy

## 2021-08-05 DIAGNOSIS — I890 Lymphedema, not elsewhere classified: Secondary | ICD-10-CM

## 2021-08-05 NOTE — Therapy (Signed)
[place], [place], [address] Phone: [phone]   Fax:  [phone]  Physical Therapy Treatment  Patient Details  Name: Evelyn Campbell MRN: [id number] Date of Birth: 25-Feb-1956 Referring Provider (PT): Loma Boston   Encounter Date: 08/05/2021   PT End of Session - 08/05/21 1524     Visit Number 7    Number of Visits 12    Date for PT Re-Evaluation 08/21/21    Authorization Type MC/ [employer]    Authorization - Visit Number 7    Progress Note Due on Visit 10    PT Start Time 1408    PT Stop Time 1525    PT Time Calculation (min) 77 min    Activity Tolerance Patient tolerated treatment well    Behavior During Therapy [facility] for tasks assessed/performed             Past Medical History:  Diagnosis Date   Arthritis    R ankle    Back pain    Chronic kidney disease    seen by Nephrology at [facility], due to all her numbers were"off", she  remarks that she was sent to superhydrate & then her labs values corrected.  Pt. will f/u /w nephrology   Constipation    Fatty liver    Gallbladder problem    GERD (gastroesophageal reflux disease)    occ   Gout    Hyperlipidemia    Hypertension    Joint pain    Lower extremity edema    Obesity    Peripheral neuropathy    Pneumonia 1980's   hx- not needed hospitalization    Vitamin D deficiency     Past Surgical History:  Procedure Laterality Date   ABDOMINAL HYSTERECTOMY  99   ANKLE FUSION Right 6/15   CHOLECYSTECTOMY  10   DENTAL SURGERY     implant- upper R    HERNIA REPAIR     INCISIONAL HERNIA REPAIR N/A 07/02/2015   Procedure: LAPAROSCOPIC INCISIONAL HERNIA;  Surgeon: Coralie Keens, MD;  Location: [place];  Service: General;  Laterality: N/A;   INGUINAL HERNIA REPAIR Bilateral 08/20/2014   Procedure: LAPAROSCOPIC BILATERAL INGUINAL HERNIA REPAIR WITH MESH;  Surgeon: Coralie Keens, MD;  Location: [place];  Service: General;  Laterality: Bilateral;    INSERTION OF MESH N/A 08/20/2014   Procedure: INSERTION OF MESH;  Surgeon: Coralie Keens, MD;  Location: [place];  Service: General;  Laterality: N/A;   INSERTION OF MESH N/A 07/02/2015   Procedure: INSERTION OF MESH;  Surgeon: Coralie Keens, MD;  Location: [place];  Service: General;  Laterality: N/A;   PANCREATECTOMY N/A 08/20/2014   Procedure: LAPAROSCOPIC ADRENALECTOMY;  Surgeon: Stark Klein, MD;  Location: [place];  Service: General;  Laterality: N/A;   TOE SURGERY Bilateral 2012   hammer toe   TONSILLECTOMY     UMBILICAL HERNIA REPAIR N/A 08/20/2014   Procedure: HERNIA REPAIR UMBILICAL ADULT;  Surgeon: Coralie Keens, MD;  Location: [place];  Service: General;  Laterality: N/A;   VAGINAL DELIVERY  1994   WRIST SURGERY      There were no vitals filed for this visit.   Subjective Assessment - 08/05/21 1521     Subjective pt states she is doing well today.  STates it did not slide down but the bottom of her Lt foot is tender.    Currently in Pain? No/denies  LYMPHEDEMA/ONCOLOGY QUESTIONNAIRE - 08/05/21 1523       Right Lower Extremity Lymphedema   20 cm Proximal to Suprapatella 71.5 cm   on eval 9/21 was: 76.7   10 cm Proximal to Suprapatella 62 cm   on eval 9/21 was: 65   At Midpatella/Popliteal Crease 53 cm   on eval 9/21 was: 53.3   30 cm Proximal to Floor at Lateral Plantar Foot 44.2 cm   on eval 9/21 was: 43   20 cm Proximal to Floor at Lateral Plantar Foot 31.5 1   on eval 9/21 was: 30.1   10 cm Proximal to Floor at Lateral Malleoli 22.5 cm   on eval 9/21 was: 25.3   Circumference of ankle/heel 34.5 cm.   on eval 9/21 was: 35.5   5 cm Proximal to 1st MTP Joint 23.5 cm   on eval 9/21 was: 23.5   Across MTP Joint 24 cm   on eval 9/21 was: 23.7   Around Proximal Great Toe 9.5 cm      Left Lower Extremity Lymphedema   20 cm Proximal to Suprapatella 69.5 cm   on eval 9/21 was: 71.8   10 cm Proximal to Suprapatella 65 cm   on eval 9/21 was:  77.8   At Midpatella/Popliteal Crease 55 cm   on eval 9/21 was: 59   30 cm Proximal to Floor at Lateral Plantar Foot 44.5 cm   on eval 9/21 was: 48.8   20 cm Proximal to Floor at Lateral Plantar Foot 34.4 cm   on eval 9/21 was: 36.7   10 cm Proximal to Floor at Lateral Malleoli 25.6 cm   on eval 9/21 was: 30.8   Circumference of ankle/heel 36 cm.   on eval 9/21 was: 39   5 cm Proximal to 1st MTP Joint 25 cm   on eval 9/21 was: 27.3   Across MTP Joint 24.5 cm   on eval 9/21 was: 25.5   Around Proximal Great Toe 7.8 cm   on eval 9/21 was: 8.3                       [facility] Adult PT Treatment/Exercise - 08/05/21 0001       Manual Therapy   Manual Therapy Manual Lymphatic Drainage (MLD);Compression Bandaging;Other (comment)    Manual therapy comments completed seperate from all other aspects of treatment    Manual Lymphatic Drainage (MLD) To include supraclavicular, deep and supeficial abdominal, routing inguinal to axillary Fb anterioar and postrerior legs B with legs done prone.    Compression Bandaging Lt thigh high multilayer short stretch bandages with 1/2in foam    Other Manual Therapy measurement                       PT Short Term Goals - 07/24/21 1526       PT SHORT TERM GOAL #1   Title Pt to be I in HEP to improve lymphatic circulation    Time 2    Period Weeks    Status On-going    Target Date 08/05/21      PT SHORT TERM GOAL #2   Title PT to have lost 2 cm measurement from knee down in her Lt LE to reduce pain to no greater than a 6/10    Time 2    Period Weeks    Status On-going  PT Long Term Goals - 07/24/21 1527       PT LONG TERM GOAL #1   Title PT to be I in self massaging to improve lymphatic flow.    Time 4    Period Weeks    Status On-going      PT LONG TERM GOAL #2   Title PT to have lost between 3-4 cm at measurement from knee down on Lt LE to reduce pain to no greater than a 3/10 to be able to complete her  Chi Tia standing without difficulty again.    Time 4    Period Weeks    Status On-going      PT LONG TERM GOAL #3   Title Pt to have obtained and to be able to don compression garment    Time 4    Period Weeks    Status On-going                   Plan - 08/05/21 1601     Clinical Impression Statement Pt measured this session with reduction in Lt upper thigh as compared to last week and distal LE maintaining same values as last week.  She has overall reduced nicely since initial evaluation 2 weeks prior in Lt LE.  Pt will continue to benefit from complete lymphedema therapy to progressively reduce Lt LE.  Added orange dense foam to medial and lateral malleoli as this area continues to have most induration.    Personal Factors and Comorbidities Fitness;Comorbidity 3+    Comorbidities gout, hx of ankle fusion, HTN, peripheral neuropathy    Examination-Activity Limitations Dressing;Locomotion Level;Stand    Examination-Participation Restrictions Cleaning;Laundry;Shop;Community Activity    Stability/Clinical Decision Making Evolving/Moderate complexity    Rehab Potential Good    PT Frequency 3x / week    PT Duration 4 weeks    PT Treatment/Interventions Therapeutic exercise;Patient/family education;Compression bandaging;Manual lymph drainage    PT Next Visit Plan Continue manual and bandages to Lt LE.  Pt likes the softer fabric liner distal LE, used 10 cm.  Measure on Wednesdays.    Consulted and Agree with Plan of Care Patient             Patient will benefit from skilled therapeutic intervention in order to improve the following deficits and impairments:  Decreased activity tolerance, Increased edema, Obesity, Pain  Visit Diagnosis: Lymphedema, not elsewhere classified     Problem List Patient Active Problem List   Diagnosis Date Noted   Lymphedema of left leg 04/21/2021   Idiopathic chronic gout of multiple sites without tophus 03/10/2021   Diverticular disease  of colon 01/08/2021   Idiopathic peripheral neuropathy 01/08/2021   Mixed hyperlipidemia 01/08/2021   Peripheral venous insufficiency 01/08/2021   Personal history of colonic polyps 01/08/2021   Vitamin D deficiency 21/19/4174   Metabolic syndrome 06/14/4817   Prediabetes 01/08/2021   Visceral obesity 12/25/2020   Degenerative arthritis of right ankle 10/05/2016   Incisional hernia 07/02/2015   Left adrenal mass ([place]) 08/20/2014   Essential hypertension 06/14/2014   Family history of ischemic heart disease 06/14/2014   Class 3 severe obesity with serious comorbidity and body mass index (BMI) of 40.0 to 44.9 in adult [facility]) 06/14/2014   Status post ankle arthrodesis 05/07/2014   Teena Irani, PTA/CLT, WTA [phone]  Teena Irani, PTA 08/05/2021, 4:02 PM  [place], [place], [address] Phone: [phone]   Fax:  [phone]  Name: Evelyn Campbell MRN: [id number] Date of Birth: 1956-09-06

## 2021-08-07 ENCOUNTER — Ambulatory Visit (HOSPITAL_COMMUNITY): Payer: Medicare Other

## 2021-08-07 ENCOUNTER — Encounter (HOSPITAL_COMMUNITY): Payer: Self-pay

## 2021-08-07 ENCOUNTER — Other Ambulatory Visit: Payer: Self-pay

## 2021-08-07 DIAGNOSIS — I890 Lymphedema, not elsewhere classified: Secondary | ICD-10-CM | POA: Diagnosis not present

## 2021-08-07 NOTE — Therapy (Signed)
[place] [address] [place], [place], [address] Phone: [phone]   Fax:  [phone]  Physical Therapy Treatment  Patient Details  Name: Evelyn Campbell MRN: [id number] Date of Birth: 07-24-1956 Referring Provider (PT): Loma Boston   Encounter Date: 08/07/2021   PT End of Session - 08/07/21 1525     Visit Number 8    Number of Visits 12    Date for PT Re-Evaluation 08/21/21    Authorization Type [facility]/ [employer]    Authorization - Visit Number 8    Progress Note Due on Visit 10    PT Start Time 6073   restroom break   PT Stop Time 1515    PT Time Calculation (min) 67 min    Activity Tolerance Patient tolerated treatment well    Behavior During Therapy [facility] for tasks assessed/performed             Past Medical History:  Diagnosis Date   Arthritis    R ankle    Back pain    Chronic kidney disease    seen by Nephrology at [facility], due to all her numbers were"off", she  remarks that she was sent to superhydrate & then her labs values corrected.  Pt. will f/u /w nephrology   Constipation    Fatty liver    Gallbladder problem    GERD (gastroesophageal reflux disease)    occ   Gout    Hyperlipidemia    Hypertension    Joint pain    Lower extremity edema    Obesity    Peripheral neuropathy    Pneumonia 1980's   hx- not needed hospitalization    Vitamin D deficiency     Past Surgical History:  Procedure Laterality Date   ABDOMINAL HYSTERECTOMY  99   ANKLE FUSION Right 6/15   CHOLECYSTECTOMY  10   DENTAL SURGERY     implant- upper R    HERNIA REPAIR     INCISIONAL HERNIA REPAIR N/A 07/02/2015   Procedure: LAPAROSCOPIC INCISIONAL HERNIA;  Surgeon: Coralie Keens, MD;  Location: [place];  Service: General;  Laterality: N/A;   INGUINAL HERNIA REPAIR Bilateral 08/20/2014   Procedure: LAPAROSCOPIC BILATERAL INGUINAL HERNIA REPAIR WITH MESH;  Surgeon: Coralie Keens, MD;  Location: [place];  Service: General;   Laterality: Bilateral;   INSERTION OF MESH N/A 08/20/2014   Procedure: INSERTION OF MESH;  Surgeon: Coralie Keens, MD;  Location: [place];  Service: General;  Laterality: N/A;   INSERTION OF MESH N/A 07/02/2015   Procedure: INSERTION OF MESH;  Surgeon: Coralie Keens, MD;  Location: [place];  Service: General;  Laterality: N/A;   PANCREATECTOMY N/A 08/20/2014   Procedure: LAPAROSCOPIC ADRENALECTOMY;  Surgeon: Stark Klein, MD;  Location: [place];  Service: General;  Laterality: N/A;   TOE SURGERY Bilateral 2012   hammer toe   TONSILLECTOMY     UMBILICAL HERNIA REPAIR N/A 08/20/2014   Procedure: HERNIA REPAIR UMBILICAL ADULT;  Surgeon: Coralie Keens, MD;  Location: [place];  Service: General;  Laterality: N/A;   VAGINAL DELIVERY  1994   WRIST SURGERY      There were no vitals filed for this visit.   Subjective Assessment - 08/07/21 1524     Subjective Pt stated her ankle bones look smaller, continues to have tenderness in Lt foot/ankle.    Pertinent History Rt ankle reconstruction, kidney disease, obesity, fatty liver dz    Patient Stated Goals LT leg swelling to be less  Currently in Pain? No/denies                               [facility] Adult PT Treatment/Exercise - 08/07/21 0001       Manual Therapy   Manual Therapy Manual Lymphatic Drainage (MLD);Compression Bandaging;Other (comment)    Manual therapy comments completed seperate from all other aspects of treatment    Manual Lymphatic Drainage (MLD) To include supraclavicular, deep and supeficial abdominal, routing inguinal to axillary Fb anterioar and postrerior legs B with legs done prone.    Compression Bandaging Lt thigh high multilayer short stretch bandages with 1/2in foam                       PT Short Term Goals - 07/24/21 1526       PT SHORT TERM GOAL #1   Title Pt to be I in HEP to improve lymphatic circulation    Time 2    Period Weeks    Status On-going    Target Date 08/05/21       PT SHORT TERM GOAL #2   Title PT to have lost 2 cm measurement from knee down in her Lt LE to reduce pain to no greater than a 6/10    Time 2    Period Weeks    Status On-going               PT Long Term Goals - 07/24/21 1527       PT LONG TERM GOAL #1   Title PT to be I in self massaging to improve lymphatic flow.    Time 4    Period Weeks    Status On-going      PT LONG TERM GOAL #2   Title PT to have lost between 3-4 cm at measurement from knee down on Lt LE to reduce pain to no greater than a 3/10 to be able to complete her Chi Tia standing without difficulty again.    Time 4    Period Weeks    Status On-going      PT LONG TERM GOAL #3   Title Pt to have obtained and to be able to don compression garment    Time 4    Period Weeks    Status On-going                   Plan - 08/07/21 1526     Clinical Impression Statement Malleoli appears decreased induration with change to orange dense foam.  Manual decongestive technqiues complete anterior and posterior LEs.  Continued application of multilayer short stretch bandages with 1/2in foam.  Added toe wrap following c/o increased toe edema.  No reports of pain at EOS.    Personal Factors and Comorbidities Fitness;Comorbidity 3+    Comorbidities gout, hx of ankle fusion, HTN, peripheral neuropathy    Examination-Activity Limitations Dressing;Locomotion Level;Stand    Examination-Participation Restrictions Cleaning;Laundry;Shop;Community Activity    Stability/Clinical Decision Making Evolving/Moderate complexity    Clinical Decision Making Moderate    Rehab Potential Good    PT Frequency 3x / week    PT Duration 4 weeks    PT Treatment/Interventions Therapeutic exercise;Patient/family education;Compression bandaging;Manual lymph drainage    PT Next Visit Plan Continue manual and bandages to Lt LE.  Pt likes the softer fabric liner distal LE, used 10 cm.  Measure on Wednesdays.    Consulted and Agree  with  Plan of Care Patient             Patient will benefit from skilled therapeutic intervention in order to improve the following deficits and impairments:  Decreased activity tolerance, Increased edema, Obesity, Pain  Visit Diagnosis: Lymphedema, not elsewhere classified     Problem List Patient Active Problem List   Diagnosis Date Noted   Lymphedema of left leg 04/21/2021   Idiopathic chronic gout of multiple sites without tophus 03/10/2021   Diverticular disease of colon 01/08/2021   Idiopathic peripheral neuropathy 01/08/2021   Mixed hyperlipidemia 01/08/2021   Peripheral venous insufficiency 01/08/2021   Personal history of colonic polyps 01/08/2021   Vitamin D deficiency 06/00/4599   Metabolic syndrome 77/41/4239   Prediabetes 01/08/2021   Visceral obesity 12/25/2020   Degenerative arthritis of right ankle 10/05/2016   Incisional hernia 07/02/2015   Left adrenal mass ([place]) 08/20/2014   Essential hypertension 06/14/2014   Family history of ischemic heart disease 06/14/2014   Class 3 severe obesity with serious comorbidity and body mass index (BMI) of 40.0 to 44.9 in adult [facility]) 06/14/2014   Status post ankle arthrodesis 05/07/2014   Ihor Austin, LPTA/CLT; CBIS [phone]  Aldona Lento, PTA 08/07/2021, 3:30 PM  [place], [place], [address] Phone: [phone]   Fax:  [phone]  Name: Evelyn Campbell MRN: [id number] Date of Birth: 07/10/1956

## 2021-08-10 ENCOUNTER — Ambulatory Visit (HOSPITAL_COMMUNITY): Payer: Medicare Other | Admitting: Physical Therapy

## 2021-08-10 ENCOUNTER — Other Ambulatory Visit: Payer: Self-pay

## 2021-08-10 DIAGNOSIS — I890 Lymphedema, not elsewhere classified: Secondary | ICD-10-CM

## 2021-08-10 NOTE — Therapy (Signed)
[place], [place], [address] Phone: [phone]   Fax:  [phone]  Physical Therapy Treatment  Patient Details  Name: Evelyn Campbell MRN: [id number] Date of Birth: [birth year] Referring Provider (PT): Loma Boston   Encounter Date: 08/10/2021   PT End of Session - 08/10/21 1308     Visit Number 9    Number of Visits 12    Date for PT Re-Evaluation 08/21/21    Authorization Type MC/ [employer]    Authorization Time Period cert 5/10-25/85    Authorization - Visit Number 9    Progress Note Due on Visit 10    PT Start Time 1055    PT Stop Time 1205    PT Time Calculation (min) 70 min    Activity Tolerance Patient tolerated treatment well    Behavior During Therapy [facility] for tasks assessed/performed             Past Medical History:  Diagnosis Date   Arthritis    R ankle    Back pain    Chronic kidney disease    seen by Nephrology at [facility], due to all her numbers were"off", she  remarks that she was sent to superhydrate & then her labs values corrected.  Pt. will f/u /w nephrology   Constipation    Fatty liver    Gallbladder problem    GERD (gastroesophageal reflux disease)    occ   Gout    Hyperlipidemia    Hypertension    Joint pain    Lower extremity edema    Obesity    Peripheral neuropathy    Pneumonia 1980's   hx- not needed hospitalization    Vitamin D deficiency     Past Surgical History:  Procedure Laterality Date   ABDOMINAL HYSTERECTOMY  99   ANKLE FUSION Right 6/15   CHOLECYSTECTOMY  10   DENTAL SURGERY     implant- upper R    HERNIA REPAIR     INCISIONAL HERNIA REPAIR N/A 07/02/2015   Procedure: LAPAROSCOPIC INCISIONAL HERNIA;  Surgeon: Coralie Keens, MD;  Location: [place];  Service: General;  Laterality: N/A;   INGUINAL HERNIA REPAIR Bilateral 08/20/2014   Procedure: LAPAROSCOPIC BILATERAL INGUINAL HERNIA REPAIR WITH MESH;  Surgeon: Coralie Keens, MD;  Location: [place];  Service: General;  Laterality: Bilateral;   INSERTION OF MESH N/A 08/20/2014   Procedure: INSERTION OF MESH;  Surgeon: Coralie Keens, MD;  Location: [place];  Service: General;  Laterality: N/A;   INSERTION OF MESH N/A 07/02/2015   Procedure: INSERTION OF MESH;  Surgeon: Coralie Keens, MD;  Location: [place];  Service: General;  Laterality: N/A;   PANCREATECTOMY N/A 08/20/2014   Procedure: LAPAROSCOPIC ADRENALECTOMY;  Surgeon: Stark Klein, MD;  Location: [place];  Service: General;  Laterality: N/A;   TOE SURGERY Bilateral 2012   hammer toe   TONSILLECTOMY     UMBILICAL HERNIA REPAIR N/A 08/20/2014   Procedure: HERNIA REPAIR UMBILICAL ADULT;  Surgeon: Coralie Keens, MD;  Location: [place];  Service: General;  Laterality: N/A;   VAGINAL DELIVERY  1994   WRIST SURGERY      There were no vitals filed for this visit.   Subjective Assessment - 08/10/21 1307     Subjective pt states her ankles are just deformed.  STate she is not hurting or have any issues today.    Currently in Pain? No/denies  [facility] Adult PT Treatment/Exercise - 08/10/21 0001       Manual Therapy   Manual Therapy Manual Lymphatic Drainage (MLD);Compression Bandaging;Other (comment)    Manual therapy comments completed seperate from all other aspects of treatment    Manual Lymphatic Drainage (MLD) To include supraclavicular, deep and supeficial abdominal, routing inguinal to axillary Fb anterioar and postrerior legs B with legs done prone.    Compression Bandaging Lt thigh high multilayer short stretch bandages with 1/2in foam                       PT Short Term Goals - 07/24/21 1526       PT SHORT TERM GOAL #1   Title Pt to be I in HEP to improve lymphatic circulation    Time 2    Period Weeks    Status On-going    Target Date 08/05/21      PT SHORT TERM GOAL #2   Title PT to have lost 2 cm measurement from knee down in her Lt LE to reduce  pain to no greater than a 6/10    Time 2    Period Weeks    Status On-going               PT Long Term Goals - 07/24/21 1527       PT LONG TERM GOAL #1   Title PT to be I in self massaging to improve lymphatic flow.    Time 4    Period Weeks    Status On-going      PT LONG TERM GOAL #2   Title PT to have lost between 3-4 cm at measurement from knee down on Lt LE to reduce pain to no greater than a 3/10 to be able to complete her Chi Tia standing without difficulty again.    Time 4    Period Weeks    Status On-going      PT LONG TERM GOAL #3   Title Pt to have obtained and to be able to don compression garment    Time 4    Period Weeks    Status On-going                   Plan - 08/10/21 1311     Clinical Impression Statement Continued with manual lymph drainage and use of orange dense foam for ankle area.  Pt encouraged to make appt with orthopedist to look at Lt ankle as states it has been hurting her for years.  Continues to contain hard tissue perimeter and unsure if this is bony structure or actual induration. Pt reported overall comfort with dressing at end of session.    Personal Factors and Comorbidities Fitness;Comorbidity 3+    Comorbidities gout, hx of ankle fusion, HTN, peripheral neuropathy    Examination-Activity Limitations Dressing;Locomotion Level;Stand    Examination-Participation Restrictions Cleaning;Laundry;Shop;Community Activity    Stability/Clinical Decision Making Evolving/Moderate complexity    Rehab Potential Good    PT Frequency 3x / week    PT Duration 4 weeks    PT Treatment/Interventions Therapeutic exercise;Patient/family education;Compression bandaging;Manual lymph drainage    PT Next Visit Plan Continue manual and bandages to Lt LE.  Pt likes the softer fabric liner distal LE, used 10 cm.  Measure on Wednesdays.    Consulted and Agree with Plan of Care Patient             Patient will benefit from skilled therapeutic  intervention in order to improve the following deficits and impairments:  Decreased activity tolerance, Increased edema, Obesity, Pain  Visit Diagnosis: Lymphedema, not elsewhere classified     Problem List Patient Active Problem List   Diagnosis Date Noted   Lymphedema of left leg 04/21/2021   Idiopathic chronic gout of multiple sites without tophus 03/10/2021   Diverticular disease of colon 01/08/2021   Idiopathic peripheral neuropathy 01/08/2021   Mixed hyperlipidemia 01/08/2021   Peripheral venous insufficiency 01/08/2021   Personal history of colonic polyps 01/08/2021   Vitamin D deficiency 40/06/6760   Metabolic syndrome 95/07/3266   Prediabetes 01/08/2021   Visceral obesity 12/25/2020   Degenerative arthritis of right ankle 10/05/2016   Incisional hernia 07/02/2015   Left adrenal mass ([place]) 08/20/2014   Essential hypertension 06/14/2014   Family history of ischemic heart disease 06/14/2014   Class 3 severe obesity with serious comorbidity and body mass index (BMI) of 40.0 to 44.9 in adult [facility]) 06/14/2014   Status post ankle arthrodesis 05/07/2014   Teena Irani, PTA/CLT, WTA [phone]  Teena Irani, PTA 08/10/2021, 1:22 PM  [place], [place], [address] Phone: [phone]   Fax:  [phone]  Name: Evelyn Campbell MRN: [id number] Date of Birth: 1956-01-24

## 2021-08-11 ENCOUNTER — Ambulatory Visit (HOSPITAL_COMMUNITY): Payer: Medicare Other | Admitting: Physical Therapy

## 2021-08-12 ENCOUNTER — Other Ambulatory Visit: Payer: Self-pay

## 2021-08-12 ENCOUNTER — Encounter (HOSPITAL_COMMUNITY): Payer: Self-pay

## 2021-08-12 ENCOUNTER — Ambulatory Visit (HOSPITAL_COMMUNITY): Payer: Medicare Other

## 2021-08-12 DIAGNOSIS — I890 Lymphedema, not elsewhere classified: Secondary | ICD-10-CM

## 2021-08-12 NOTE — Therapy (Addendum)
[place] [address] [place], [place], [address] Phone: [phone]   Fax:  [phone]  Physical Therapy Treatment  Patient Details  Name: Evelyn Campbell MRN: [id number] Date of Birth: 03/06/1956 Referring Provider (PT): Loma Boston  Progress Note Reporting Period 07/22/2021 to 08/12/2021  See note below for Objective Data and Assessment of Progress/Goals.     Encounter Date: 08/12/2021   PT End of Session - 08/12/21 1225     Visit Number 10    Number of Visits 12    Date for PT Re-Evaluation 08/21/21    Authorization Type MC/ [employer]    Authorization Time Period cert 1/76-16/07    Authorization - Visit Number 10    Progress Note Due on Visit 20    PT Start Time 1055    PT Stop Time 1213    PT Time Calculation (min) 78 min    Activity Tolerance Patient tolerated treatment well    Behavior During Therapy [facility] for tasks assessed/performed             Past Medical History:  Diagnosis Date   Arthritis    R ankle    Back pain    Chronic kidney disease    seen by Nephrology at [facility], due to all her numbers were"off", she  remarks that she was sent to superhydrate & then her labs values corrected.  Pt. will f/u /w nephrology   Constipation    Fatty liver    Gallbladder problem    GERD (gastroesophageal reflux disease)    occ   Gout    Hyperlipidemia    Hypertension    Joint pain    Lower extremity edema    Obesity    Peripheral neuropathy    Pneumonia 1980's   hx- not needed hospitalization    Vitamin D deficiency     Past Surgical History:  Procedure Laterality Date   ABDOMINAL HYSTERECTOMY  99   ANKLE FUSION Right 6/15   CHOLECYSTECTOMY  10   DENTAL SURGERY     implant- upper R    HERNIA REPAIR     INCISIONAL HERNIA REPAIR N/A 07/02/2015   Procedure: LAPAROSCOPIC INCISIONAL HERNIA;  Surgeon: Coralie Keens, MD;  Location: [place];  Service: General;  Laterality: N/A;   INGUINAL HERNIA REPAIR  Bilateral 08/20/2014   Procedure: LAPAROSCOPIC BILATERAL INGUINAL HERNIA REPAIR WITH MESH;  Surgeon: Coralie Keens, MD;  Location: [place];  Service: General;  Laterality: Bilateral;   INSERTION OF MESH N/A 08/20/2014   Procedure: INSERTION OF MESH;  Surgeon: Coralie Keens, MD;  Location: [place];  Service: General;  Laterality: N/A;   INSERTION OF MESH N/A 07/02/2015   Procedure: INSERTION OF MESH;  Surgeon: Coralie Keens, MD;  Location: [place];  Service: General;  Laterality: N/A;   PANCREATECTOMY N/A 08/20/2014   Procedure: LAPAROSCOPIC ADRENALECTOMY;  Surgeon: Stark Klein, MD;  Location: [place];  Service: General;  Laterality: N/A;   TOE SURGERY Bilateral 2012   hammer toe   TONSILLECTOMY     UMBILICAL HERNIA REPAIR N/A 08/20/2014   Procedure: HERNIA REPAIR UMBILICAL ADULT;  Surgeon: Coralie Keens, MD;  Location: [place];  Service: General;  Laterality: N/A;   VAGINAL DELIVERY  1994   WRIST SURGERY      There were no vitals filed for this visit.   Subjective Assessment - 08/12/21 1224     Subjective Pt stated her foot and ankles feel deformed, continues to c/o ankle discomfort.  Pertinent History Rt ankle reconstruction, kidney disease, obesity, fatty liver dz    Patient Stated Goals LT leg swelling to be less    Currently in Pain? No/denies                   LYMPHEDEMA/ONCOLOGY QUESTIONNAIRE - 08/12/21 0001       Right Lower Extremity Lymphedema   20 cm Proximal to Suprapatella 72.3 cm   07/22/21 initial eval was: 73.7   10 cm Proximal to Suprapatella 63.6 cm   07/22/21 initial eval was: 65   At Midpatella/Popliteal Crease 51 cm   07/22/21 initial eval was: 53.3   30 cm Proximal to Floor at Lateral Plantar Foot 41.8 cm   07/22/21 initial eval was: 43   20 cm Proximal to Floor at Lateral Plantar Foot 30.1 1   07/22/21 initial eval was: 30.1   10 cm Proximal to Floor at Lateral Malleoli 23.6 cm   07/22/21 initial eval was: 25.3   Circumference of ankle/heel 34.6 cm.    07/22/21 initial eval was:  35.5   5 cm Proximal to 1st MTP Joint 24.5 cm   07/22/21 initial eval was: 23.5   Across MTP Joint 22.6 cm   07/22/21 initial eval was: 23.7   Around Proximal Great Toe 9.4 cm   on 08/05/21 was: 9.5     Left Lower Extremity Lymphedema   20 cm Proximal to Suprapatella 70 cm   07/22/21 initial eval was: 71.8   10 cm Proximal to Suprapatella 65.8 cm   07/22/21 initial eval was: 77.8   At Midpatella/Popliteal Crease 54.6 cm   07/22/21 initial eval was: 59   30 cm Proximal to Floor at Lateral Plantar Foot 43.8 cm   07/22/21 initial eval was: 48.8   20 cm Proximal to Floor at Lateral Plantar Foot 31 cm   07/22/21 initial eval was: 36.7   10 cm Proximal to Floor at Lateral Malleoli 27.3 cm   07/22/21 initial eval was: 30.8   Circumference of ankle/heel 36 cm.   07/22/21 initial eval was: 39   5 cm Proximal to 1st MTP Joint 26.8 cm   07/22/21 initial eval was: 27.3   Across MTP Joint 24 cm   07/22/21 initial eval was: 25.5   Around Proximal Great Toe 8 cm   07/22/21 initial eval was: 8.3                       [facility] Adult PT Treatment/Exercise - 08/12/21 0001       Manual Therapy   Manual Therapy Manual Lymphatic Drainage (MLD);Compression Bandaging;Other (comment)    Manual therapy comments completed seperate from all other aspects of treatment    Manual Lymphatic Drainage (MLD) To include supraclavicular, deep and supeficial abdominal, routing inguinal to axillary Fb anterioar only due to time restraints.    Compression Bandaging Lt thigh high multilayer short stretch bandages with 1/2in foam    Other Manual Therapy measurement                       PT Short Term Goals - 08/12/21 1717       PT SHORT TERM GOAL #1   Title Pt to be I in HEP to improve lymphatic circulation      PT SHORT TERM GOAL #2   Title PT to have lost 2 cm measurement from knee down in her Lt LE to reduce pain to no greater than a  6/10    Status Achieved                PT Long Term Goals - 08/12/21 1719       PT LONG TERM GOAL #1   Title PT to be I in self massaging to improve lymphatic flow.    Status Achieved      PT LONG TERM GOAL #2   Title PT to have lost between 3-4 cm at measurement from knee down on Lt LE to reduce pain to no greater than a 3/10 to be able to complete her Chi Tia standing without difficulty again.    Status On-going      PT LONG TERM GOAL #3   Title Pt to have obtained and to be able to don compression garment                   Plan - 08/12/21 1714     Clinical Impression Statement Measurements taken with overall reduction in volume for Lt LE.  Continued to be limited with edema dorsal aspect and ankle.  Manual decongestive techniques complete wiht increased focus on ankle.  Application of multilayer short stretch bandages with 1/2in foam to Lt thigh.  Pt reports compliance wiht HEP and is performed self manual.    Personal Factors and Comorbidities Fitness;Comorbidity 3+    Comorbidities gout, hx of ankle fusion, HTN, peripheral neuropathy    Examination-Activity Limitations Dressing;Locomotion Level;Stand    Examination-Participation Restrictions Cleaning;Laundry;Shop;Community Activity    Stability/Clinical Decision Making Evolving/Moderate complexity    Clinical Decision Making Moderate    Rehab Potential Good    PT Frequency 3x / week    PT Duration 4 weeks    PT Treatment/Interventions Therapeutic exercise;Patient/family education;Compression bandaging;Manual lymph drainage    PT Next Visit Plan Measure for [employer] next session.  Continue manual and bandages to Lt LE.  Pt likes the softer fabric liner distal LE, used 10 cm.  Measure on Wednesdays.    Consulted and Agree with Plan of Care Patient             Patient will benefit from skilled therapeutic intervention in order to improve the following deficits and impairments:  Decreased activity tolerance, Increased edema, Obesity,  Pain  Visit Diagnosis: Lymphedema, not elsewhere classified     Problem List Patient Active Problem List   Diagnosis Date Noted   Lymphedema of left leg 04/21/2021   Idiopathic chronic gout of multiple sites without tophus 03/10/2021   Diverticular disease of colon 01/08/2021   Idiopathic peripheral neuropathy 01/08/2021   Mixed hyperlipidemia 01/08/2021   Peripheral venous insufficiency 01/08/2021   Personal history of colonic polyps 01/08/2021   Vitamin D deficiency 73/41/9379   Metabolic syndrome 02/40/9735   Prediabetes 01/08/2021   Visceral obesity 12/25/2020   Degenerative arthritis of right ankle 10/05/2016   Incisional hernia 07/02/2015   Left adrenal mass ([place]) 08/20/2014   Essential hypertension 06/14/2014   Family history of ischemic heart disease 06/14/2014   Class 3 severe obesity with serious comorbidity and body mass index (BMI) of 40.0 to 44.9 in adult [facility]) 06/14/2014   Status post ankle arthrodesis 05/07/2014   Ihor Austin, LPTA/CLT; CBIS [place], PT CLT [phone]  08/12/2021, 5:22 PM  [place], [place], [address] Phone: [phone]   Fax:  [phone]  Name: Kamira Mellette MRN: [id number] Date of Birth: 1956/04/08

## 2021-08-13 ENCOUNTER — Encounter (HOSPITAL_COMMUNITY): Payer: Medicare Other | Admitting: Physical Therapy

## 2021-08-14 ENCOUNTER — Encounter (HOSPITAL_COMMUNITY): Payer: Self-pay

## 2021-08-14 ENCOUNTER — Other Ambulatory Visit: Payer: Self-pay

## 2021-08-14 ENCOUNTER — Ambulatory Visit (HOSPITAL_COMMUNITY): Payer: Medicare Other

## 2021-08-14 DIAGNOSIS — I890 Lymphedema, not elsewhere classified: Secondary | ICD-10-CM

## 2021-08-14 NOTE — Therapy (Signed)
[place], [place], [address] Phone: [phone]   Fax:  [phone]  Physical Therapy Treatment  Patient Details  Name: Evelyn Campbell MRN: [id number] Date of Birth: 1956/02/21 Referring Provider (PT): Loma Boston   Encounter Date: 08/14/2021   PT End of Session - 08/14/21 2014     Visit Number 11    Number of Visits 12    Date for PT Re-Evaluation 08/21/21    Authorization Type MC/ [employer]    Authorization Time Period cert 0/86-76/19    Authorization - Visit Number 11    Progress Note Due on Visit 20    PT Start Time 1900    PT Stop Time 2005    PT Time Calculation (min) 65 min    Activity Tolerance Patient tolerated treatment well    Behavior During Therapy [facility] for tasks assessed/performed             Past Medical History:  Diagnosis Date   Arthritis    R ankle    Back pain    Chronic kidney disease    seen by Nephrology at [facility], due to all her numbers were"off", she  remarks that she was sent to superhydrate & then her labs values corrected.  Pt. will f/u /w nephrology   Constipation    Fatty liver    Gallbladder problem    GERD (gastroesophageal reflux disease)    occ   Gout    Hyperlipidemia    Hypertension    Joint pain    Lower extremity edema    Obesity    Peripheral neuropathy    Pneumonia 1980's   hx- not needed hospitalization    Vitamin D deficiency     Past Surgical History:  Procedure Laterality Date   ABDOMINAL HYSTERECTOMY  99   ANKLE FUSION Right 6/15   CHOLECYSTECTOMY  10   DENTAL SURGERY     implant- upper R    HERNIA REPAIR     INCISIONAL HERNIA REPAIR N/A 07/02/2015   Procedure: LAPAROSCOPIC INCISIONAL HERNIA;  Surgeon: Coralie Keens, MD;  Location: [place];  Service: General;  Laterality: N/A;   INGUINAL HERNIA REPAIR Bilateral 08/20/2014   Procedure: LAPAROSCOPIC BILATERAL INGUINAL HERNIA REPAIR WITH MESH;  Surgeon: Coralie Keens, MD;  Location: [place];  Service: General;  Laterality: Bilateral;   INSERTION OF MESH N/A 08/20/2014   Procedure: INSERTION OF MESH;  Surgeon: Coralie Keens, MD;  Location: [place];  Service: General;  Laterality: N/A;   INSERTION OF MESH N/A 07/02/2015   Procedure: INSERTION OF MESH;  Surgeon: Coralie Keens, MD;  Location: [place];  Service: General;  Laterality: N/A;   PANCREATECTOMY N/A 08/20/2014   Procedure: LAPAROSCOPIC ADRENALECTOMY;  Surgeon: Stark Klein, MD;  Location: [place];  Service: General;  Laterality: N/A;   TOE SURGERY Bilateral 2012   hammer toe   TONSILLECTOMY     UMBILICAL HERNIA REPAIR N/A 08/20/2014   Procedure: HERNIA REPAIR UMBILICAL ADULT;  Surgeon: Coralie Keens, MD;  Location: [place];  Service: General;  Laterality: N/A;   VAGINAL DELIVERY  1994   WRIST SURGERY      There were no vitals filed for this visit.   Subjective Assessment - 08/14/21 1855     Subjective Pt late for apt due to ride buying vehicle, called prior session to inform of delay.  Reports she can tell some reduction, was on her feet all day following removal of bandages.  Her left  foot continues to look abnormal.    Pertinent History Rt ankle reconstruction, kidney disease, obesity, fatty liver dz    Patient Stated Goals LT leg swelling to be less    Currently in Pain? Yes    Pain Score 2     Pain Location Ankle    Pain Orientation Left    Pain Descriptors / Indicators Aching;Sore;Tightness;Heaviness    Pain Type Chronic pain                               [facility] Adult PT Treatment/Exercise - 08/14/21 0001       Manual Therapy   Manual Therapy Manual Lymphatic Drainage (MLD);Compression Bandaging;Other (comment)    Manual therapy comments completed seperate from all other aspects of treatment    Manual Lymphatic Drainage (MLD) To include supraclavicular, deep and supeficial abdominal, routing inguinal to axillary Fb anterioar only due to time restraints.    Compression Bandaging Lt  thigh high multilayer short stretch bandages with 1/2in foam    Other Manual Therapy Measured for compression garment [employer] paper work given                       PT Short Term Goals - 08/12/21 1717       PT SHORT TERM GOAL #1   Title Pt to be I in HEP to improve lymphatic circulation      PT SHORT TERM GOAL #2   Title PT to have lost 2 cm measurement from knee down in her Lt LE to reduce pain to no greater than a 6/10    Status Achieved               PT Long Term Goals - 08/12/21 1719       PT LONG TERM GOAL #1   Title PT to be I in self massaging to improve lymphatic flow.    Status Achieved      PT LONG TERM GOAL #2   Title PT to have lost between 3-4 cm at measurement from knee down on Lt LE to reduce pain to no greater than a 3/10 to be able to complete her Chi Tia standing without difficulty again.    Status On-going      PT LONG TERM GOAL #3   Title Pt to have obtained and to be able to don compression garment                   Plan - 08/14/21 2014     Clinical Impression Statement Measurements taken for compression garment and handout given for [place].  Demonstrated use of butler for increased use of donning gament.  Manual complete anterior only due to pt.'s late arrival.  Applicatoin of short stretch bandages with 1/2in foam to Lt thigh.  Overall Lt LE appears close to Rt LE minus the inflammed ankle still present.    Personal Factors and Comorbidities Fitness;Comorbidity 3+    Comorbidities gout, hx of ankle fusion, HTN, peripheral neuropathy    Examination-Activity Limitations Dressing;Locomotion Level;Stand    Examination-Participation Restrictions Cleaning;Laundry;Shop;Community Activity    Stability/Clinical Decision Making Evolving/Moderate complexity    Clinical Decision Making Moderate    Rehab Potential Good    PT Frequency 3x / week    PT Duration 4 weeks    PT Treatment/Interventions Therapeutic  exercise;Patient/family education;Compression bandaging;Manual lymph drainage    PT Next Visit  Plan Assure pt able to donn garment prior DC to HEP.  Continue manual and bandages to Lt LE.  Pt likes the softer fabric liner distal LE, used 10 cm.  Measure on Wednesdays.    Consulted and Agree with Plan of Care Patient;Family member/caregiver    Family Member Consulted husband Richardson Landry             Patient will benefit from skilled therapeutic intervention in order to improve the following deficits and impairments:  Decreased activity tolerance, Increased edema, Obesity, Pain  Visit Diagnosis: Lymphedema, not elsewhere classified     Problem List Patient Active Problem List   Diagnosis Date Noted   Lymphedema of left leg 04/21/2021   Idiopathic chronic gout of multiple sites without tophus 03/10/2021   Diverticular disease of colon 01/08/2021   Idiopathic peripheral neuropathy 01/08/2021   Mixed hyperlipidemia 01/08/2021   Peripheral venous insufficiency 01/08/2021   Personal history of colonic polyps 01/08/2021   Vitamin D deficiency 37/08/6268   Metabolic syndrome 48/54/6270   Prediabetes 01/08/2021   Visceral obesity 12/25/2020   Degenerative arthritis of right ankle 10/05/2016   Incisional hernia 07/02/2015   Left adrenal mass ([place]) 08/20/2014   Essential hypertension 06/14/2014   Family history of ischemic heart disease 06/14/2014   Class 3 severe obesity with serious comorbidity and body mass index (BMI) of 40.0 to 44.9 in adult [facility]) 06/14/2014   Status post ankle arthrodesis 05/07/2014   Ihor Austin, LPTA/CLT; CBIS [phone]  Aldona Lento, PTA 08/14/2021, 8:19 PM  [place], [place], [address] Phone: [phone]   Fax:  [phone]  Name: Evelyn Campbell MRN: [id number] Date of Birth: 1956/05/15

## 2021-08-17 ENCOUNTER — Ambulatory Visit (HOSPITAL_COMMUNITY): Payer: Medicare Other | Admitting: Physical Therapy

## 2021-08-17 ENCOUNTER — Ambulatory Visit (HOSPITAL_COMMUNITY): Payer: Medicare Other

## 2021-08-17 ENCOUNTER — Other Ambulatory Visit: Payer: Self-pay

## 2021-08-17 ENCOUNTER — Encounter (HOSPITAL_COMMUNITY): Payer: Self-pay

## 2021-08-17 DIAGNOSIS — I890 Lymphedema, not elsewhere classified: Secondary | ICD-10-CM

## 2021-08-17 NOTE — Therapy (Addendum)
[place], [place], [address] Phone: [phone]   Fax:  [phone]  Physical Therapy Treatment  Patient Details  Name: Evelyn Campbell MRN: [id number] Date of Birth: [birth year] Referring Provider (PT): Loma Boston   Encounter Date: 08/17/2021   PT End of Session - 08/17/21 1813     Visit Number 12    Number of Visits 15   Date for PT Re-Evaluation 08/21/21    Authorization Type [facility]/ [employer]    Authorization Time Period cert 6/83-41/96    Authorization - Visit Number 12    Progress Note Due on Visit 20    PT Start Time 1655    PT Stop Time 1808    PT Time Calculation (min) 73 min    Activity Tolerance Patient tolerated treatment well    Behavior During Therapy [facility] for tasks assessed/performed             Past Medical History:  Diagnosis Date   Arthritis    R ankle    Back pain    Chronic kidney disease    seen by Nephrology at [facility], due to all her numbers were"off", she  remarks that she was sent to superhydrate & then her labs values corrected.  Pt. will f/u /w nephrology   Constipation    Fatty liver    Gallbladder problem    GERD (gastroesophageal reflux disease)    occ   Gout    Hyperlipidemia    Hypertension    Joint pain    Lower extremity edema    Obesity    Peripheral neuropathy    Pneumonia 1980's   hx- not needed hospitalization    Vitamin D deficiency     Past Surgical History:  Procedure Laterality Date   ABDOMINAL HYSTERECTOMY  99   ANKLE FUSION Right 6/15   CHOLECYSTECTOMY  10   DENTAL SURGERY     implant- upper R    HERNIA REPAIR     INCISIONAL HERNIA REPAIR N/A 07/02/2015   Procedure: LAPAROSCOPIC INCISIONAL HERNIA;  Surgeon: Coralie Keens, MD;  Location: [place];  Service: General;  Laterality: N/A;   INGUINAL HERNIA REPAIR Bilateral 08/20/2014   Procedure: LAPAROSCOPIC BILATERAL INGUINAL HERNIA REPAIR WITH MESH;  Surgeon: Coralie Keens, MD;  Location: [place];  Service: General;  Laterality: Bilateral;   INSERTION OF MESH N/A 08/20/2014   Procedure: INSERTION OF MESH;  Surgeon: Coralie Keens, MD;  Location: [place];  Service: General;  Laterality: N/A;   INSERTION OF MESH N/A 07/02/2015   Procedure: INSERTION OF MESH;  Surgeon: Coralie Keens, MD;  Location: [place];  Service: General;  Laterality: N/A;   PANCREATECTOMY N/A 08/20/2014   Procedure: LAPAROSCOPIC ADRENALECTOMY;  Surgeon: Stark Klein, MD;  Location: [place];  Service: General;  Laterality: N/A;   TOE SURGERY Bilateral 2012   hammer toe   TONSILLECTOMY     UMBILICAL HERNIA REPAIR N/A 08/20/2014   Procedure: HERNIA REPAIR UMBILICAL ADULT;  Surgeon: Coralie Keens, MD;  Location: [place];  Service: General;  Laterality: N/A;   VAGINAL DELIVERY  1994   WRIST SURGERY      There were no vitals filed for this visit.   Subjective Assessment - 08/17/21 1812     Subjective Pt stated she called [facility] with no answer, awaiting message return. Stated her left leg seem same or smaller than Rt LE upon removal of dressings today.    Pertinent History Rt ankle reconstruction, kidney  disease, obesity, fatty liver dz    Patient Stated Goals LT leg swelling to be less    Currently in Pain? No/denies                   LYMPHEDEMA/ONCOLOGY QUESTIONNAIRE - 08/17/21 0001       Right Lower Extremity Lymphedema   20 cm Proximal to Suprapatella 72.3 cm   9/21/022 initial eval was: 73.7   10 cm Proximal to Suprapatella 63.6 cm   9/21/022 initial eval was: 65   At Midpatella/Popliteal Crease 51 cm   9/21/022 initial eval was:  53.3   30 cm Proximal to Floor at Lateral Plantar Foot 42.8 cm   9/21/022 initial eval was: 43   20 cm Proximal to Floor at Lateral Plantar Foot 30.1 1   9/21/022 initial eval was: 30.1   10 cm Proximal to Floor at Lateral Malleoli 23.6 cm   9/21/022 initial eval was: 25.3   Circumference of ankle/heel 35 cm.   9/21/022 initial eval was: 35.5   5 cm Proximal  to 1st MTP Joint 24.5 cm   9/21/022 initial eval was: 23.5   Across MTP Joint 22.6 cm   9/21/022 initial eval was: 23.7   Around Proximal Great Toe 9.4 cm   9/21/022 initial eval was: 9.5     Left Lower Extremity Lymphedema   20 cm Proximal to Suprapatella 68.8 cm   9/21/022 initial eval was: 71.8   10 cm Proximal to Suprapatella 65 cm   9/21/022 initial eval was: 77.8   At Midpatella/Popliteal Crease 53.8 cm   9/21/022 initial eval was: 59   30 cm Proximal to Floor at Lateral Plantar Foot 44 cm   9/21/022 initial eval was: 48.8   20 cm Proximal to Floor at Lateral Plantar Foot 31.6 cm   9/21/022 initial eval was: 36.7   10 cm Proximal to Floor at Lateral Malleoli 26.6 cm   9/21/022 initial eval was: 30.8   Circumference of ankle/heel 36 cm.   9/21/022 initial eval was: 39   5 cm Proximal to 1st MTP Joint 26.6 cm   9/21/022 initial eval was: 27.3   Across MTP Joint 23.8 cm   9/21/022 initial eval was: 25.5   Around Proximal Great Toe 8 cm   9/21/022 initial eval was: 8.3                                  PT Short Term Goals - 08/12/21 1717       PT SHORT TERM GOAL #1   Title Pt to be I in HEP to improve lymphatic circulation      PT SHORT TERM GOAL #2   Title PT to have lost 2 cm measurement from knee down in her Lt LE to reduce pain to no greater than a 6/10    Status Achieved               PT Long Term Goals - 08/12/21 1719       PT LONG TERM GOAL #1   Title PT to be I in self massaging to improve lymphatic flow.    Status Achieved      PT LONG TERM GOAL #2   Title PT to have lost between 3-4 cm at measurement from knee down on Lt LE to reduce pain to no greater than a 3/10 to be able to complete her [place]  standing without difficulty again.    Status On-going      PT LONG TERM GOAL #3   Title Pt to have obtained and to be able to don compression garment                   Plan - 08/17/21 1814     Clinical Impression Statement Pt  educated and able to demonstrate donning compression garment with min cueing.  Reports she attempted to order compression garments, left message and awaiting return call.  Manual decongestive techniques complete anterior and posterior followed by application of multilayer short stretch bandages with 1/2in foam thigh high including toe wrap.  Pt will be ready for DC when received garments and reports ability to donn independenlty, 1-2 session remaining.  Measurements taken per 12th visit with reduction noted.    Personal Factors and Comorbidities Fitness;Comorbidity 3+    Comorbidities gout, hx of ankle fusion, HTN, peripheral neuropathy    Examination-Activity Limitations Dressing;Locomotion Level;Stand    Examination-Participation Restrictions Cleaning;Laundry;Shop;Community Activity    Stability/Clinical Decision Making Evolving/Moderate complexity    Clinical Decision Making Moderate    Rehab Potential Good    PT Frequency 3x / week    PT Duration 4 weeks    PT Treatment/Interventions Therapeutic exercise;Patient/family education;Compression bandaging;Manual lymph drainage    PT Next Visit Plan Explain to pt that we can not keep on case load just until she gets her garments.  She can go to [employer], [employer] or drive down to [place] this week.Continue manual and bandages to Lt LE.    Consulted and Agree with Plan of Care Patient             Patient will benefit from skilled therapeutic intervention in order to improve the following deficits and impairments:  Decreased activity tolerance, Increased edema, Obesity, Pain  Visit Diagnosis: Lymphedema, not elsewhere classified     Problem List Patient Active Problem List   Diagnosis Date Noted   Lymphedema of left leg 04/21/2021   Idiopathic chronic gout of multiple sites without tophus 03/10/2021   Diverticular disease of colon 01/08/2021   Idiopathic peripheral neuropathy 01/08/2021   Mixed hyperlipidemia  01/08/2021   Peripheral venous insufficiency 01/08/2021   Personal history of colonic polyps 01/08/2021   Vitamin D deficiency 08/31/5944   Metabolic syndrome 85/92/9244   Prediabetes 01/08/2021   Visceral obesity 12/25/2020   Degenerative arthritis of right ankle 10/05/2016   Incisional hernia 07/02/2015   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

## 2021-08-18 ENCOUNTER — Encounter (HOSPITAL_COMMUNITY): Payer: Medicare Other

## 2021-08-19 ENCOUNTER — Ambulatory Visit (INDEPENDENT_AMBULATORY_CARE_PROVIDER_SITE_OTHER): Payer: Medicare Other | Admitting: Adult Health

## 2021-08-19 ENCOUNTER — Encounter (HOSPITAL_COMMUNITY): Payer: Medicare Other

## 2021-08-20 ENCOUNTER — Encounter (HOSPITAL_COMMUNITY): Payer: Medicare Other | Admitting: Physical Therapy

## 2021-08-21 ENCOUNTER — Ambulatory Visit (HOSPITAL_COMMUNITY): Payer: Medicare Other

## 2021-08-24 ENCOUNTER — Encounter (HOSPITAL_COMMUNITY): Payer: Medicare Other | Admitting: Physical Therapy

## 2021-08-24 ENCOUNTER — Ambulatory Visit (HOSPITAL_BASED_OUTPATIENT_CLINIC_OR_DEPARTMENT_OTHER): Payer: Medicare Other | Admitting: Cardiology

## 2021-08-25 ENCOUNTER — Encounter (HOSPITAL_COMMUNITY): Payer: Self-pay

## 2021-08-26 ENCOUNTER — Encounter (HOSPITAL_COMMUNITY): Payer: Self-pay | Admitting: Physical Therapy

## 2021-08-28 ENCOUNTER — Ambulatory Visit (HOSPITAL_COMMUNITY): Payer: Medicare Other

## 2021-08-31 ENCOUNTER — Encounter (HOSPITAL_COMMUNITY): Payer: Self-pay | Admitting: Physical Therapy

## 2021-09-02 ENCOUNTER — Encounter (HOSPITAL_COMMUNITY): Payer: Self-pay | Admitting: Physical Therapy

## 2021-09-02 ENCOUNTER — Other Ambulatory Visit (INDEPENDENT_AMBULATORY_CARE_PROVIDER_SITE_OTHER): Payer: Self-pay | Admitting: Adult Health

## 2021-09-02 DIAGNOSIS — I10 Essential (primary) hypertension: Secondary | ICD-10-CM

## 2021-09-02 NOTE — Telephone Encounter (Signed)
LAST APPOINTMENT DATE: 07/02/21 NEXT APPOINTMENT DATE: 09/29/21   [id number] [place], [place] - [address] [place] [address] Phone: [phone] Fax: [phone]  Patient is requesting a refill of the following medications: Pending Prescriptions:                       Disp   Refills   amLODipine (NORVASC) 5 MG tablet [Pharmacy*90 tab*0       Sig: TAKE 1 TABLET (5 MG TOTAL) BY MOUTH DAILY.   Date last filled: 04/16/21  Previously prescribed by [facility]  Lab Results      Component                Value               Date                      HGBA1C                   5.9 (H)             05/22/2020           Lab Results      Component                Value               Date                      LDLCALC                  124                 12/26/2020                CREATININE               0.7                 02/17/2021           Lab Results      Component                Value               Date                      VD25OH                   29.3 (L)            05/22/2020            BP Readings from Last 3 Encounters: 07/02/21 : 134/81 04/16/21 : 103/70 04/02/21 : 110/71

## 2021-09-02 NOTE — Telephone Encounter (Signed)
Katy ?

## 2021-09-04 ENCOUNTER — Encounter (HOSPITAL_COMMUNITY): Payer: Self-pay | Admitting: Physical Therapy

## 2021-09-21 ENCOUNTER — Other Ambulatory Visit (INDEPENDENT_AMBULATORY_CARE_PROVIDER_SITE_OTHER): Payer: Self-pay | Admitting: Adult Health

## 2021-09-21 DIAGNOSIS — I10 Essential (primary) hypertension: Secondary | ICD-10-CM

## 2021-09-21 NOTE — Telephone Encounter (Signed)
Pt last seen by Katy Danford, FNP.  

## 2021-09-21 NOTE — Telephone Encounter (Signed)
LAST APPOINTMENT DATE: 07/02/21 NEXT APPOINTMENT DATE: 09/29/21  Pt cx last appointment   [id number] [place], [place] - [address] [place] [address] Phone: [phone] Fax: [phone]  Patient is requesting a refill of the following medications: Pending Prescriptions:                       Disp   Refills   amLODipine (NORVASC) 5 MG tablet [Pharmacy*90 tab*0       Sig: Take 1 tablet (5 mg total) by mouth daily.   Date last filled: 04/16/21 Previously prescribed by [facility]  Lab Results      Component                Value               Date                      HGBA1C                   5.9 (H)             05/22/2020           Lab Results      Component                Value               Date                      LDLCALC                  124                 12/26/2020                CREATININE               0.7                 02/17/2021           Lab Results      Component                Value               Date                      VD25OH                   29.3 (L)            05/22/2020            BP Readings from Last 3 Encounters: 07/02/21 : 134/81 04/16/21 : 103/70 04/02/21 : 110/71

## 2021-09-29 ENCOUNTER — Ambulatory Visit (INDEPENDENT_AMBULATORY_CARE_PROVIDER_SITE_OTHER): Payer: Medicare Other | Admitting: Family Medicine

## 2021-09-29 ENCOUNTER — Other Ambulatory Visit: Payer: Self-pay

## 2021-09-29 ENCOUNTER — Encounter (INDEPENDENT_AMBULATORY_CARE_PROVIDER_SITE_OTHER): Payer: Self-pay | Admitting: Family Medicine

## 2021-09-29 VITALS — BP 142/74 | HR 56 | Temp 97.5°F | Ht 65.0 in | Wt 234.0 lb

## 2021-09-29 DIAGNOSIS — K760 Fatty (change of) liver, not elsewhere classified: Secondary | ICD-10-CM | POA: Diagnosis not present

## 2021-09-29 DIAGNOSIS — I890 Lymphedema, not elsewhere classified: Secondary | ICD-10-CM | POA: Diagnosis not present

## 2021-09-29 DIAGNOSIS — R635 Abnormal weight gain: Secondary | ICD-10-CM | POA: Diagnosis not present

## 2021-09-29 DIAGNOSIS — E8881 Metabolic syndrome: Secondary | ICD-10-CM | POA: Diagnosis not present

## 2021-09-29 DIAGNOSIS — Z6841 Body Mass Index (BMI) 40.0 and over, adult: Secondary | ICD-10-CM

## 2021-09-30 ENCOUNTER — Ambulatory Visit (INDEPENDENT_AMBULATORY_CARE_PROVIDER_SITE_OTHER): Payer: Medicare Other | Admitting: Cardiology

## 2021-09-30 ENCOUNTER — Encounter: Payer: Self-pay | Admitting: Cardiology

## 2021-09-30 DIAGNOSIS — E782 Mixed hyperlipidemia: Secondary | ICD-10-CM

## 2021-09-30 DIAGNOSIS — I890 Lymphedema, not elsewhere classified: Secondary | ICD-10-CM

## 2021-09-30 DIAGNOSIS — Z8249 Family history of ischemic heart disease and other diseases of the circulatory system: Secondary | ICD-10-CM

## 2021-09-30 DIAGNOSIS — I10 Essential (primary) hypertension: Secondary | ICD-10-CM

## 2021-09-30 NOTE — Progress Notes (Signed)
Cardiology Office Note:    Date:  09/30/2021   ID:  Pegeen, Stiger 05/01/1956, MRN [id number]  PCP:  Cari Caraway, MD  Cardiologist:  Candee Furbish, MD  Electrophysiologist:  None   Referring MD: Cari Caraway, MD    History of Present Illness:    Evelyn Campbell is a 65 y.o. female here for the follow-up of hypertension.  She has been in lymphedema decongestive therapy.  Father died age 76 from MI.  Statin intolerant.  Taking Lovaza and Niaspan  Prior syncope with normal EKG likely vasovagal.  Exercising, stationary bike 5-7 miles. Daily.   Have fallen a few times. Saw husband Richardson Landry as well.   Taught art.   Today: Overall, she states that she "hasn't been great."   Beginning this past Easter, she has been dx with lymphedema in her left LE, and it is very painful. For a month she was in decongestive therapy. She was supposed to complete 2 months of therapy but reports she was dismissed early. Every night she is using the [facility] compression pump.  Additionally she had a flare-up of gout in her left LE since her last visit.  For exercise she was completing 6 miles on her recumbent bike daily. Now she is able to complete 3 miles before her ankles begin hurting.  She denies any palpitations, chest pain, or shortness of breath. No lightheadedness, headaches, syncope, orthopnea, or PND.  Past Medical History:  Diagnosis Date   Arthritis    R ankle    Back pain    Chronic kidney disease    seen by Nephrology at [facility], due to all her numbers were"off", she  remarks that she was sent to superhydrate & then her labs values corrected.  Pt. will f/u /w nephrology   Constipation    Fatty liver    Gallbladder problem    GERD (gastroesophageal reflux disease)    occ   Gout    Hyperlipidemia    Hypertension    Joint pain    Lower extremity edema    Obesity    Peripheral neuropathy    Pneumonia 1980's   hx- not needed hospitalization    Vitamin D  deficiency     Past Surgical History:  Procedure Laterality Date   ABDOMINAL HYSTERECTOMY  99   ANKLE FUSION Right 6/15   CHOLECYSTECTOMY  10   DENTAL SURGERY     implant- upper R    HERNIA REPAIR     INCISIONAL HERNIA REPAIR N/A 07/02/2015   Procedure: LAPAROSCOPIC INCISIONAL HERNIA;  Surgeon: Coralie Keens, MD;  Location: [place];  Service: General;  Laterality: N/A;   INGUINAL HERNIA REPAIR Bilateral 08/20/2014   Procedure: LAPAROSCOPIC BILATERAL INGUINAL HERNIA REPAIR WITH MESH;  Surgeon: Coralie Keens, MD;  Location: [place];  Service: General;  Laterality: Bilateral;   INSERTION OF MESH N/A 08/20/2014   Procedure: INSERTION OF MESH;  Surgeon: Coralie Keens, MD;  Location: [place];  Service: General;  Laterality: N/A;   INSERTION OF MESH N/A 07/02/2015   Procedure: INSERTION OF MESH;  Surgeon: Coralie Keens, MD;  Location: [place];  Service: General;  Laterality: N/A;   PANCREATECTOMY N/A 08/20/2014   Procedure: LAPAROSCOPIC ADRENALECTOMY;  Surgeon: Stark Klein, MD;  Location: [place];  Service: General;  Laterality: N/A;   TOE SURGERY Bilateral 2012   hammer toe   TONSILLECTOMY     UMBILICAL HERNIA REPAIR N/A 08/20/2014   Procedure: HERNIA REPAIR UMBILICAL ADULT;  Surgeon: Coralie Keens, MD;  [facility];  Service: General;  Laterality: N/A;   VAGINAL DELIVERY  1994   WRIST SURGERY      Current Medications: Current Meds  Medication Sig   allopurinol (ZYLOPRIM) 300 MG tablet Take 300 mg by mouth daily.   amLODipine (NORVASC) 5 MG tablet Take 1 tablet (5 mg total) by mouth daily.   aspirin EC 81 MG tablet Take 81 mg by mouth at bedtime.    atenolol (TENORMIN) 100 MG tablet Take 100 mg by mouth at bedtime.   Cholecalciferol (VITAMIN D) 2000 UNITS tablet Take 2,000 Units by mouth daily.   colchicine 0.6 MG tablet 0.12 mg as needed.   docusate sodium (COLACE) 100 MG capsule Take 200 mg by mouth daily.    gabapentin (NEURONTIN) 300 MG capsule Take 1,200 mg by mouth at  bedtime.    Multiple Vitamin (MULTIVITAMIN WITH MINERALS) TABS tablet Take 1 tablet by mouth 2 (two) times daily. Centrum Heart   niacin (NIASPAN) 1000 MG CR tablet Take 2 tablets (2,000 mg total) by mouth at bedtime.   omega-3 acid ethyl esters (LOVAZA) 1 G capsule Take 2 g by mouth 2 (two) times daily.    omeprazole (PRILOSEC) 20 MG capsule Take 20 mg by mouth daily.     Allergies:   Celebrex [celecoxib], Ibuprofen, Meloxicam, Lipitor [atorvastatin], Statins, Amoxicillin, and Penicillins   Social History   Socioeconomic History   Marital status: Married    Spouse name: Not on file   Number of children: Not on file   Years of education: Not on file   Highest education level: Not on file  Occupational History   Occupation: retired  Tobacco Use   Smoking status: Never   Smokeless tobacco: Never  Vaping Use   Vaping Use: Never used  Substance and Sexual Activity   Alcohol use: Yes    Comment: wine for special occasion    Drug use: No   Sexual activity: Not on file  Other Topics Concern   Not on file  Social History Narrative   Not on file   Social Determinants of Health   Financial Resource Strain: Not on file  Food Insecurity: Not on file  Transportation Needs: Not on file  Physical Activity: Not on file  Stress: Not on file  Social Connections: Not on file     Family History: The patient's family history includes Heart failure in her father; Sudden death in her father.  ROS:   Please see the history of present illness.    (+) Left LE lymphedema, painful All other systems reviewed and are negative.  EKGs/Labs/Other Studies Reviewed:    The following studies were reviewed today:  [name] LE Venous DVT 02/17/2021: Summary:  RIGHT:  - No evidence of common femoral vein obstruction.     LEFT:  - There is no evidence of deep vein thrombosis in the lower extremity.     - No cystic structure found in the popliteal fossa.  - Ultrasound characteristics of enlarged  lymph nodes noted in the groin.   ECHO 2020  1. Left ventricular ejection fraction, by visual estimation, is 60 to  65%. The left ventricle has normal function. Normal left ventricular size.  There is mildly increased left ventricular hypertrophy.   2. Global right ventricle has normal systolic function.The right  ventricular size is normal.   3. Left atrial size was normal.   4. Right atrial size was normal.   5. The mitral valve is normal in structure. No  evidence of mitral valve  regurgitation. No evidence of mitral stenosis.   6. The tricuspid valve is normal in structure. Tricuspid valve  regurgitation is trivial.   7. The aortic valve is tricuspid Aortic valve regurgitation was not  visualized by color flow Doppler. Mild aortic valve sclerosis without  stenosis.   8. The pulmonic valve was normal in structure. Pulmonic valve  regurgitation is trivial by color flow Doppler.   9. Mildly elevated pulmonary artery systolic pressure.  10. The inferior vena cava is normal in size with greater than 50%  respiratory variability, suggesting right atrial pressure of 3 mmHg.  11. Normal LV systolic function; grade 2 diastolic dysfunction; mild LVH.   EKG:  EKG is personally reviewed and interpreted. 09/30/2021: Sinus bradycardia. Rate 52 bpm. 02/22/2020: SB 59.   Recent Labs: 02/17/2021: ALT 104; BUN 13; Creatinine 0.7; Hemoglobin 13.8; Platelets 182; Potassium 4.3; Sodium 142   Recent Lipid Panel    Component Value Date/Time   CHOL 225 (A) 12/26/2020 0000   TRIG 207 (A) 12/26/2020 0000   HDL 65 12/26/2020 0000   LDLCALC 124 12/26/2020 0000    Physical Exam:    VS:  BP 130/80   Pulse (!) 52   Ht 5\' 5"  (1.651 m)   Wt 240 lb 3.2 oz (109 kg)   SpO2 98%   BMI 39.97 kg/m     Wt Readings from Last 3 Encounters:  09/30/21 240 lb 3.2 oz (109 kg)  09/29/21 234 lb (106.1 kg)  07/02/21 230 lb (104.3 kg)     GEN:  Well nourished, well developed in no acute distress,  overweight HEENT: Normal NECK: No JVD; No carotid bruits LYMPHATICS: No lymphadenopathy CARDIAC: RRR, 1/6 systolic murmur, no rubs, no gallops RESPIRATORY:  Clear to auscultation without rales, wheezing or rhonchi  ABDOMEN: Soft, non-tender, non-distended MUSCULOSKELETAL: Bilateral lower extremity edema, left markedly greater than right, left-sided lymphedema; No deformity  SKIN: Warm and dry NEUROLOGIC:  Alert and oriented x 3 PSYCHIATRIC:  Normal affect   ASSESSMENT:    1. Essential hypertension   2. Family history of ischemic heart disease   3. Mixed hyperlipidemia   4. Lymphedema of left leg     PLAN:    In order of problems listed above:  Essential hypertension Overall doing fairly well.  Continue with current medication management which includes atenolol 100 mg at bedtime amlodipine 5 mg a day.  Refills as needed.  Family history of ischemic heart disease Father had MI at age 52.  Continue to promote exercise.  Bicycle.  Mixed hyperlipidemia Has trouble with statin intolerance in the past.  Prior LDL 111 triglycerides 160.  On niacin.  Lymphedema of left leg She has gone through physical therapy for this, wraps in [place].  Seen Dr. Hay[facility] K[facility] at [facility].  Still struggling.  Continue with current therapies.   Follow-up: 1 year  Medication Adjustments/Labs and Tests Ordered: Current medicines are reviewed at length with the patient today.  Concerns regarding medicines are outlined above.   Orders Placed This Encounter  Procedures   EKG 12-Lead    No orders of the defined types were placed in this encounter.  Patient Instructions  Medication Instructions:  Your physician recommends that you continue on your current medications as directed. Please refer to the Current Medication list given to you today.  *If you need a refill on your cardiac medications before your next appointment, please call your pharmacy*   Lab Work: None ordered  If you  have labs (blood  work) drawn today and your tests are completely normal, you will receive your results only by: [place] (if you have MyChart) OR A paper copy in the mail If you have any lab test that is abnormal or we need to change your treatment, we will call you to review the results.   Testing/Procedures: None ordered   Follow-Up: At [facility], you and your health needs are our priority.  As part of our continuing mission to provide you with exceptional heart care, we have created designated Provider Care Teams.  These Care Teams include your primary Cardiologist (physician) and Advanced Practice Providers (APPs -  Physician Assistants and Nurse Practitioners) who all work together to provide you with the care you need, when you need it.  We recommend signing up for the patient portal called "MyChart".  Sign up information is provided on this After Visit Summary.  MyChart is used to connect with patients for Virtual Visits (Telemedicine).  Patients are able to view lab/test results, encounter notes, upcoming appointments, etc.  Non-urgent messages can be sent to your provider as well.   To learn more about what you can do with MyChart, go to [email or web address].    Your next appointment:   12 month(s)  The format for your next appointment:   In Person  Provider:   Candee Furbish, MD     Other Instructions    I,Mathew Stumpf,acting as a scribe for Candee Furbish, MD.,have documented all relevant documentation on the behalf of Candee Furbish, MD,as directed by  Candee Furbish, MD while in the presence of Candee Furbish, MD.  I, Candee Furbish, MD, have reviewed all documentation for this visit. The documentation on 09/30/21 for the exam, diagnosis, procedures, and orders are all accurate and complete.   Signed, Candee Furbish, MD  09/30/2021 5:05 PM    [place]

## 2021-09-30 NOTE — Progress Notes (Signed)
Chief Complaint:   OBESITY Evelyn Campbell is here to discuss her progress with her obesity treatment plan along with follow-up of her obesity related diagnoses. See Medical Weight Management Flowsheet for complete bioelectrical impedance results.  Today's visit was #: 45 Starting weight: 246 lbs Starting date: 06/05/2020 Weight change since last visit: +4 lbs Total lbs lost to date: 12 lbs Total weight loss percentage to date: -4.88%  Nutrition Plan: Keeping a food journal and adhering to recommended goals of 1000 calories and 85 grams of protein daily for 75-80% of the time. Activity: None.  Interim History: Evelyn Campbell was diagnosed with lymphedema by Dr. Haynes Kerns in Vascular at [facility].  She says she is frustrated and depressed.  She is unable to exercise.  Assessment/Plan:   1. Metabolic syndrome with visceral obesity Starting goal: Lose 7-10% of starting weight. She will continue to focus on protein-rich, low simple carbohydrate foods. We reviewed the importance of hydration, regular exercise for stress reduction, and restorative sleep.  We will continue to check lab work every 3 months, with 10% weight loss, or should any other concerns arise.  Current visceral fat rating: 17. Visceral fat rating goal is < 13. Visceral adipose tissue is a hormonally active component of total body fat. This body composition phenotype is associated with medical disorders such as metabolic syndrome, cardiovascular disease, and several malignancies including prostate, breast, and colorectal cancers. Starting goal: Lose 7-10% of starting weight.   2. Lymphedema of left leg Recently diagnosed by Dr. Haynes Kerns in Vascular in [place].  Plan:  Will monitor.  Follow-up with Vascular.  3. NAFLD (nonalcoholic fatty liver disease) The mainstay of treatment of NAFLD includes lifestyle modification to achieve weight loss, at least 7% of current body weight. Low carbohydrate diets can be beneficial in improving  NAFLD liver histology. Additionally, exercise, even the absence of weight loss can have beneficial effects on the patient's metabolic profile and liver health. We recommend that their metabolic comorbidities be aggressively managed, as patients with NAFLD are at increased risk of coronary artery disease.  4. Abnormal weight gain Continue to follow meal plan. We discussed medication options, including GLP1RA.  5. Obesity, current BMI 39.0  Course: [name] is currently in the action stage of change. As such, her goal is to continue with weight loss efforts.   Nutrition goals: She has agreed to keeping a food journal and adhering to recommended goals of 1000 calories and 85 grams of protein.   Exercise goals:  As tolerated.  Behavioral modification strategies: increasing lean protein intake, decreasing simple carbohydrates, increasing vegetables, increasing water intake, and decreasing liquid calories.  Maudean has agreed to follow-up with our clinic in 6 weeks. She was informed of the importance of frequent follow-up visits to maximize her success with intensive lifestyle modifications for her multiple health conditions.   Objective:   Blood pressure (!) 142/74, pulse (!) 56, temperature (!) 97.5 F (36.4 C), temperature source Oral, height 5\' 5"  (1.651 m), weight 234 lb (106.1 kg), SpO2 99 %. Body mass index is 38.94 kg/m.  General: Cooperative, alert, well developed, in no acute distress. HEENT: Conjunctivae and lids unremarkable. Cardiovascular: Regular rhythm.  Lungs: Normal work of breathing. Neurologic: No focal deficits.   Lab Results  Component Value Date   CREATININE 0.7 02/17/2021   BUN 13 02/17/2021   NA 142 02/17/2021   K 4.3 02/17/2021   CL 102 02/17/2021   CO2 30 (A) 02/17/2021   Lab Results  Component Value  Date   ALT 104 (A) 02/17/2021   AST 25 02/17/2021   ALKPHOS 104 02/17/2021   BILITOT 0.4 05/22/2020   Lab Results  Component Value Date   HGBA1C 5.9  (H) 05/22/2020   Lab Results  Component Value Date   INSULIN 22.1 05/22/2020   Lab Results  Component Value Date   CHOL 225 (A) 12/26/2020   HDL 65 12/26/2020   LDLCALC 124 12/26/2020   TRIG 207 (A) 12/26/2020   Lab Results  Component Value Date   VD25OH 29.3 (L) 05/22/2020   Lab Results  Component Value Date   WBC 6.7 02/17/2021   HGB 13.8 02/17/2021   HCT 42 02/17/2021   MCV 85 05/22/2020   PLT 182 02/17/2021   Attestation Statements:   Reviewed by clinician on day of visit: allergies, medications, problem list, medical history, surgical history, family history, social history, and previous encounter notes.  Time spent on visit including pre-visit chart review and post-visit care and documentation was 42 minutes. .Time was spent on: Food choices and timing of food intake reviewed today. I discussed a personalized meal plan with the patient that will help her to lose weight and will improve her obesity-related conditions going forward. I performed a medically necessary appropriate examination and/or evaluation. I discussed the assessment and treatment plan with the patient. Motivational interviewing as well as evidence-based interventions for health behavior change were utilized today including the discussion of self monitoring techniques, problem-solving barriers and SMART goal setting techniques.  An exercise prescription was reviewed.  The patient was provided an opportunity to ask questions and all were answered. The patient agreed with the plan and demonstrated an understanding of the instructions. Clinical information was updated and documented in the EMR.  I, Water quality scientist, CMA, am acting as transcriptionist for [name], DO  I have reviewed the above documentation for accuracy and completeness, and I agree with the above. -  [name], DO, MS, FAAFP, DABOM - Family and Bariatric Medicine.

## 2021-09-30 NOTE — Assessment & Plan Note (Signed)
Overall doing fairly well.  Continue with current medication management which includes atenolol 100 mg at bedtime amlodipine 5 mg a day.  Refills as needed.

## 2021-09-30 NOTE — Assessment & Plan Note (Signed)
Father had MI at age 67.  Continue to promote exercise.  Bicycle.

## 2021-09-30 NOTE — Assessment & Plan Note (Signed)
Has trouble with statin intolerance in the past.  Prior LDL 111 triglycerides 160.  On niacin.

## 2021-09-30 NOTE — Patient Instructions (Signed)
Medication Instructions:  Your physician recommends that you continue on your current medications as directed. Please refer to the Current Medication list given to you today.  *If you need a refill on your cardiac medications before your next appointment, please call your pharmacy*   Lab Work: None ordered  If you have labs (blood work) drawn today and your tests are completely normal, you will receive your results only by: [place] (if you have MyChart) OR A paper copy in the mail If you have any lab test that is abnormal or we need to change your treatment, we will call you to review the results.   Testing/Procedures: None ordered   Follow-Up: At [facility], you and your health needs are our priority.  As part of our continuing mission to provide you with exceptional heart care, we have created designated Provider Care Teams.  These Care Teams include your primary Cardiologist (physician) and Advanced Practice Providers (APPs -  Physician Assistants and Nurse Practitioners) who all work together to provide you with the care you need, when you need it.  We recommend signing up for the patient portal called "MyChart".  Sign up information is provided on this After Visit Summary.  MyChart is used to connect with patients for Virtual Visits (Telemedicine).  Patients are able to view lab/test results, encounter notes, upcoming appointments, etc.  Non-urgent messages can be sent to your provider as well.   To learn more about what you can do with MyChart, go to [email or web address].    Your next appointment:   12 month(s)  The format for your next appointment:   In Person  Provider:   Candee Furbish, MD     Other Instructions

## 2021-09-30 NOTE — Assessment & Plan Note (Signed)
She has gone through physical therapy for this, wraps in [place].  Seen Dr. Haynes Kerns at [facility].  Still struggling.  Continue with current therapies.

## 2021-11-10 ENCOUNTER — Other Ambulatory Visit: Payer: Self-pay

## 2021-11-10 ENCOUNTER — Encounter (INDEPENDENT_AMBULATORY_CARE_PROVIDER_SITE_OTHER): Payer: Self-pay | Admitting: Adult Health

## 2021-11-10 ENCOUNTER — Ambulatory Visit (INDEPENDENT_AMBULATORY_CARE_PROVIDER_SITE_OTHER): Payer: Medicare Other | Admitting: Adult Health

## 2021-11-10 VITALS — BP 133/74 | HR 64 | Temp 98.7°F | Ht 65.0 in | Wt 234.0 lb

## 2021-11-10 DIAGNOSIS — I10 Essential (primary) hypertension: Secondary | ICD-10-CM | POA: Diagnosis not present

## 2021-11-10 DIAGNOSIS — R635 Abnormal weight gain: Secondary | ICD-10-CM

## 2021-11-10 DIAGNOSIS — Z6839 Body mass index (BMI) 39.0-39.9, adult: Secondary | ICD-10-CM

## 2021-11-10 DIAGNOSIS — R7303 Prediabetes: Secondary | ICD-10-CM

## 2021-11-10 DIAGNOSIS — I890 Lymphedema, not elsewhere classified: Secondary | ICD-10-CM | POA: Diagnosis not present

## 2021-11-10 MED ORDER — OZEMPIC (0.25 OR 0.5 MG/DOSE) 2 MG/1.5ML ~~LOC~~ SOPN: 0.2500 mg | PEN_INJECTOR | SUBCUTANEOUS | 0 refills | Status: DC

## 2021-11-10 MED ORDER — BD PEN NEEDLE NANO 2ND GEN 32G X 4 MM MISC: 1.0000 | Freq: Two times a day (BID) | 0 refills | Status: DC

## 2021-11-11 NOTE — Progress Notes (Signed)
Chief Complaint:   OBESITY Evelyn Campbell is here to discuss her progress with her obesity treatment plan along with follow-up of her obesity related diagnoses. Kynedi is on keeping a food journal and adhering to recommended goals of 1000 calories and 85 grams of protein and states she is following her eating plan approximately 85% of the time. Jamina states she is doing chair exercises/exercise bike for 15-45 minutes 7 times per week.  Today's visit was #: 10 Starting weight: 246 lbs Starting date: 06/05/2020 Today's weight: 234 lbs Today's date: 11/10/2021 Total lbs lost to date: 12 lbs Total lbs lost since last in-office visit: 0  Interim History:  Evelyn Campbell is pleased to have maintained her weight over the holidays. She continues to be frustrated with LLE lymphedema- she has f/u with Dr. Haynes Kerns at end of this month.  Subjective:   1. Essential hypertension BP/HR stable at office visit. She is on amlodipine 5 mg daily, atenolol 100 mg daily.  2. Lymphedema of left leg Diagnosed by Dr. Marvis Moeller -  next follow-up 11/23/2021. She tried lymphedema decongestive therapy - 12 visits - completed October 2022.   She is unable to wear recommended compression socks of 20 mmHg. She continues to be frustrated with the sx's of lymphedema.  3. Abnormal weight gain She denies family history of MTC or personal history of pancreatitis. She has considered GLP-1RA therapy and is agreeable to starting.  Assessment/Plan:   1. Essential hypertension Continue current antihypertensive regimen.  2. Lymphedema of left leg Try to wear some form of compression and strongly encouraged to keep f/u appt with Dr. Haynes Kerns.  3. Abnormal weight gain Start Ozempic 0.25 mg once weekly, as per below. Will send in pen needles as well.  - Start Semaglutide,0.25 or 0.5MG /DOS, (OZEMPIC, 0.25 OR 0.5 MG/DOSE,) 2 MG/1.5ML SOPN; Inject 0.25 mg into the skin once a week.  Dispense: 2 mL; Refill: 0 -  Insulin Pen Needle (BD PEN NEEDLE NANO 2ND GEN) 32G X 4 MM MISC; 1 Package by Does not apply route in the morning and at bedtime.  Dispense: 100 each; Refill: 0  4. Obesity, current BMI 39.0  Evelyn Campbell is currently in the action stage of change. As such, her goal is to continue with weight loss efforts. She has agreed to keeping a food journal and adhering to recommended goals of 1000 calories and 85 grams of protein.   Exercise goals:  As is.  Behavioral modification strategies: increasing lean protein intake, decreasing simple carbohydrates, meal planning and cooking strategies, keeping healthy foods in the home, planning for success, and keeping a strict food journal.  Evelyn Campbell has agreed to follow-up with our clinic in 4-5 weeks. She was informed of the importance of frequent follow-up visits to maximize her success with intensive lifestyle modifications for her multiple health conditions.   Objective:   Blood pressure 133/74, pulse 64, temperature 98.7 F (37.1 C), height 5\' 5"  (1.651 m), weight 234 lb (106.1 kg), SpO2 99 %. Body mass index is 38.94 kg/m.  General: Cooperative, alert, well developed, in no acute distress. HEENT: Conjunctivae and lids unremarkable. Cardiovascular: Regular rhythm.  Lungs: Normal work of breathing. Neurologic: No focal deficits.   Lab Results  Component Value Date   CREATININE 0.7 02/17/2021   BUN 13 02/17/2021   NA 142 02/17/2021   K 4.3 02/17/2021   CL 102 02/17/2021   CO2 30 (A) 02/17/2021   Lab Results  Component Value Date   ALT 104 (A) 02/17/2021  AST 25 02/17/2021   ALKPHOS 104 02/17/2021   BILITOT 0.4 05/22/2020   Lab Results  Component Value Date   HGBA1C 5.9 (H) 05/22/2020   Lab Results  Component Value Date   INSULIN 22.1 05/22/2020   Lab Results  Component Value Date   CHOL 225 (A) 12/26/2020   HDL 65 12/26/2020   LDLCALC 124 12/26/2020   TRIG 207 (A) 12/26/2020   Lab Results  Component Value Date   VD25OH 29.3  (L) 05/22/2020   Lab Results  Component Value Date   WBC 6.7 02/17/2021   HGB 13.8 02/17/2021   HCT 42 02/17/2021   MCV 85 05/22/2020   PLT 182 02/17/2021   Obesity Behavioral Intervention:   Approximately 15 minutes were spent on the discussion below.  ASK: We discussed the diagnosis of obesity with Toyna today and Bethanie agreed to give [name] permission to discuss obesity behavioral modification therapy today.  ASSESS: Adrienne has the diagnosis of obesity and her BMI today is 39.0. Essence is in the action stage of change.   ADVISE: Marcelina was educated on the multiple health risks of obesity as well as the benefit of weight loss to improve her health. She was advised of the need for long term treatment and the importance of lifestyle modifications to improve her current health and to decrease her risk of future health problems.  AGREE: Multiple dietary modification options and treatment options were discussed and Laela agreed to follow the recommendations documented in the above note.  ARRANGE: Monik was educated on the importance of frequent visits to treat obesity as outlined per CMS and USPSTF guidelines and agreed to schedule her next follow up appointment today.  Attestation Statements:   Reviewed by clinician on day of visit: allergies, medications, problem list, medical history, surgical history, family history, social history, and previous encounter notes.  I, Water quality scientist, CMA, am acting as Location manager for Mina Marble, NP.  I have reviewed the above documentation for accuracy and completeness, and I agree with the above. -  Gurneet Matarese d. Magdiel Bartles, NP-C

## 2021-11-17 ENCOUNTER — Encounter (INDEPENDENT_AMBULATORY_CARE_PROVIDER_SITE_OTHER): Payer: Self-pay | Admitting: Family Medicine

## 2021-12-17 ENCOUNTER — Ambulatory Visit (INDEPENDENT_AMBULATORY_CARE_PROVIDER_SITE_OTHER): Payer: Medicare Other | Admitting: Family Medicine

## 2021-12-23 ENCOUNTER — Ambulatory Visit (INDEPENDENT_AMBULATORY_CARE_PROVIDER_SITE_OTHER): Payer: Medicare Other | Admitting: Family Medicine

## 2021-12-23 ENCOUNTER — Other Ambulatory Visit: Payer: Self-pay

## 2021-12-23 ENCOUNTER — Encounter (INDEPENDENT_AMBULATORY_CARE_PROVIDER_SITE_OTHER): Payer: Self-pay | Admitting: Family Medicine

## 2021-12-23 VITALS — BP 127/65 | HR 62 | Temp 97.5°F | Ht 65.0 in | Wt 238.0 lb

## 2021-12-23 DIAGNOSIS — E669 Obesity, unspecified: Secondary | ICD-10-CM | POA: Diagnosis not present

## 2021-12-23 DIAGNOSIS — Z6839 Body mass index (BMI) 39.0-39.9, adult: Secondary | ICD-10-CM

## 2021-12-23 DIAGNOSIS — R262 Difficulty in walking, not elsewhere classified: Secondary | ICD-10-CM | POA: Diagnosis not present

## 2021-12-23 DIAGNOSIS — Z6841 Body Mass Index (BMI) 40.0 and over, adult: Secondary | ICD-10-CM

## 2021-12-23 DIAGNOSIS — I890 Lymphedema, not elsewhere classified: Secondary | ICD-10-CM

## 2021-12-23 DIAGNOSIS — E8881 Metabolic syndrome: Secondary | ICD-10-CM | POA: Diagnosis not present

## 2021-12-28 NOTE — Progress Notes (Signed)
Chief Complaint:   OBESITY Evelyn Campbell is here to discuss her progress with her obesity treatment plan along with follow-up of her obesity related diagnoses. See Medical Weight Management Flowsheet for complete bioelectrical impedance results.  Today's visit was #: 20 Starting weight: 246 lbs Starting date: 06/05/2020 Weight change since last visit: +4 lbs Total lbs lost to date: 8 lbs Total weight loss percentage to date: -3.25%  Nutrition Plan: Keeping a food journal and adhering to recommended goals of 1000 calories and 85 grams of protein daily for 80% of the time. Activity: Chair exercises for 45 minutes 7 times per week.   Interim History: Reviewed Evelyn Campbell's recent notes from Vascular. She has not started Ozempic.  Assessment/Plan:   1. Metabolic syndrome with visceral obesity Starting goal: Lose 7-10% of starting weight. She will continue to focus on protein-rich, low simple carbohydrate foods. We reviewed the importance of hydration, regular exercise for stress reduction, and restorative sleep.  We will continue to check lab work every 3 months, with 10% weight loss, or should any other concerns arise.  Current visceral fat rating: 18. Visceral fat rating goal is < 13. Visceral adipose tissue is a hormonally active component of total body fat. This body composition phenotype is associated with medical disorders such as metabolic syndrome, cardiovascular disease, and several malignancies including prostate, breast, and colorectal cancers. Starting goal: Lose 7-10% of starting weight.   2. Lymphedema of left leg Kavina saw Dr. Haynes Kerns in Vascular Surgery on 11/23/2021.  I have reviewed her notes from that visit.  She is to return to see Vascular PRN.  3. Trouble walking Evelyn Campbell has difficulty walking due to pain and swelling in her feet.  It was recommended by Vascular that she see Podiatry or Orthopedics for her foot pain.  4. Obesity, current BMI 39.7  Course: Evelyn Campbell is  currently in the action stage of change. As such, her goal is to continue with weight loss efforts.   Nutrition goals: She has agreed to keeping a food journal and adhering to recommended goals of 1000 calories and 85 grams of protein.   Exercise goals:  As is.  Behavioral modification strategies: increasing lean protein intake, decreasing simple carbohydrates, and increasing vegetables.  Jarissa has agreed to follow-up with our clinic in 4 weeks. She was informed of the importance of frequent follow-up visits to maximize her success with intensive lifestyle modifications for her multiple health conditions.   Objective:   Blood pressure 127/65, pulse 62, temperature (!) 97.5 F (36.4 C), temperature source Oral, height 5\' 5"  (1.651 m), weight 238 lb (108 kg), SpO2 99 %. Body mass index is 39.61 kg/m.  General: Cooperative, alert, well developed, in no acute distress. HEENT: Conjunctivae and lids unremarkable. Cardiovascular: Regular rhythm.  Lungs: Normal work of breathing. Neurologic: No focal deficits.   Lab Results  Component Value Date   CREATININE 0.7 02/17/2021   BUN 13 02/17/2021   NA 142 02/17/2021   K 4.3 02/17/2021   CL 102 02/17/2021   CO2 30 (A) 02/17/2021   Lab Results  Component Value Date   ALT 104 (A) 02/17/2021   AST 25 02/17/2021   ALKPHOS 104 02/17/2021   BILITOT 0.4 05/22/2020   Lab Results  Component Value Date   HGBA1C 5.9 (H) 05/22/2020   Lab Results  Component Value Date   INSULIN 22.1 05/22/2020   Lab Results  Component Value Date   CHOL 225 (A) 12/26/2020   HDL 65 12/26/2020  Irene 124 12/26/2020   TRIG 207 (A) 12/26/2020   Lab Results  Component Value Date   VD25OH 29.3 (L) 05/22/2020   Lab Results  Component Value Date   WBC 6.7 02/17/2021   HGB 13.8 02/17/2021   HCT 42 02/17/2021   MCV 85 05/22/2020   PLT 182 02/17/2021   Attestation Statements:   Reviewed by clinician on day of visit: allergies, medications, problem  list, medical history, surgical history, family history, social history, and previous encounter notes.  I, Water quality scientist, CMA, am acting as transcriptionist for [name], DO  I have reviewed the above documentation for accuracy and completeness, and I agree with the above. -  [name], DO, MS, FAAFP, DABOM - Family and Bariatric Medicine.

## 2022-01-13 ENCOUNTER — Ambulatory Visit (INDEPENDENT_AMBULATORY_CARE_PROVIDER_SITE_OTHER): Payer: Medicare Other | Admitting: Adult Health

## 2022-01-20 NOTE — Progress Notes (Addendum)
YAZMINA, PAREJA ([id number]) ?Visit Report for 01/21/2022 ?Allergy List Details ?Patient Name: Date of Service: ?[name], RO [facility] 01/21/2022 8:00 A M ?Medical Record Number: [id number] ?Patient Account Number: [id number] ?Date of Birth/Sex: Treating RN: ?07-Apr-1956 (66 y.o. F) ?Primary Care Dravon Nott: [name] Other Clinician: ?Referring Kenia Teagarden: ?Treating Makaylin Carlo/Extender: [name] ?[name] ?[name]: 0 ?Allergies ?Active Allergies ?celecoxib ?ibuprofen ?meloxicam ?Lipitor ?amoxicillin ?penicillin ?Allergy Notes ?Electronic Signature(s) ?Signed: 01/22/2022 12:01:46 PM By: Dellie Catholic RN ?Previous Signature: 01/20/2022 11:19:03 AM Version By: Valeria Batman EMT ?Entered By: Dellie Catholic on 01/21/2022 08:17:11 ?-------------------------------------------------------------------------------- ?Arrival Information Details ?Patient Name: Date of Service: ?[name], RO [facility] 01/21/2022 8:00 A M ?Medical Record Number: [id number] ?Patient Account Number: [id number] ?Date of Birth/Sex: Treating RN: ?Mar 11, 1956 (66 y.o. F) [name], [name] ?Primary Care Ariannah Arenson: [name] Other Clinician: ?Referring Yamilet Mcfayden: ?Treating Ritamarie Arkin/Extender: [name] ?[name] ?[name]: 0 ?Visit Information ?Patient Arrived: [name] ?Arrival Time: 08:08 ?Accompanied By: self ?Transfer Assistance: None ?Patient Identification Verified: Yes ?Electronic Signature(s) ?Signed: 01/22/2022 12:01:46 PM By: Dellie Catholic RN ?Entered By: Dellie Catholic on 01/21/2022 08:09:23 ?-------------------------------------------------------------------------------- ?Clinic Level of Care Assessment Details ?Patient Name: Date of Service: ?[name], RO [facility] 01/21/2022 8:00 A M ?Medical Record Number: [id number] ?Patient Account Number: [id number] ?Date of Birth/Sex: Treating RN: ?04/15/1956 (66 y.o. F) [name], [name] ?Primary Care Talicia Sui: [name] Other Clinician: ?Referring  Eira Alpert: ?Treating Khalin Royce/Extender: [name] ?[name] ?[name]: 0 ?Clinic Level of Care Assessment Items ?TOOL 1 Quantity Score ?'[]'$  - 0 ?Use when EandM and Procedure is performed on INITIAL visit ?ASSESSMENTS - Nursing Assessment / Reassessment ?X- 1 20 ?General Physical Exam (combine w/ comprehensive assessment (listed just below) when performed on new pt. evals) ?X- 1 25 ?Comprehensive Assessment (HX, ROS, Risk Assessments, Wounds Hx, etc.) ?ASSESSMENTS - Wound and Skin Assessment / Reassessment ?'[]'$  - 0 ?Dermatologic / Skin Assessment (not related to wound area) ?ASSESSMENTS - Ostomy and/or Continence Assessment and Care ?'[]'$  - 0 ?Incontinence Assessment and Management ?'[]'$  - 0 ?Ostomy Care Assessment and Management (repouching, etc.) ?PROCESS - Coordination of Care ?X - Simple Patient / Family Education for ongoing care 1 15 ?'[]'$  - 0 ?Complex (extensive) Patient / Family Education for ongoing care ?X- 1 10 ?Staff obtains Consents, Records, T Results / Process Orders ?est ?X- 1 10 ?Staff telephones HHA, Nursing Homes / Clarify orders / etc ?'[]'$  - 0 ?Routine Transfer to another Facility (non-emergent condition) ?'[]'$  - 0 ?Routine Hospital Admission (non-emergent condition) ?X- 1 15 ?New Admissions / Biomedical engineer / Ordering NPWT Apligraf, etc. ?, ?'[]'$  - 0 ?Emergency Hospital Admission (emergent condition) ?PROCESS - Special Needs ?'[]'$  - 0 ?Pediatric / Minor Patient Management ?'[]'$  - 0 ?Isolation Patient Management ?'[]'$  - 0 ?Hearing / Language / Visual special needs ?'[]'$  - 0 ?Assessment of Community assistance (transportation, D/C planning, etc.) ?'[]'$  - 0 ?Additional assistance / Altered mentation ?'[]'$  - 0 ?Support Surface(s) Assessment (bed, cushion, seat, etc.) ?INTERVENTIONS - Miscellaneous ?'[]'$  - 0 ?External ear exam ?'[]'$  - 0 ?Patient Transfer (multiple staff / Civil Service fast streamer / Similar devices) ?'[]'$  - 0 ?Simple Staple / Suture removal (25 or less) ?'[]'$  - 0 ?Complex Staple / Suture removal  (26 or more) ?'[]'$  - 0 ?Hypo/Hyperglycemic Management (do not check if billed separately) ?X- 1 15 ?Ankle / Brachial Index (ABI) - do not check if billed separately ?Has the patient been seen at the hospital within the last three years: Yes ?Total Score: 110 ?Level Of Care:  New/Established - Level 3 ?Electronic Signature(s) ?Signed: 01/22/2022 12:01:46 PM By: Dellie Catholic RN ?Signed: 01/22/2022 12:01:46 PM By: Dellie Catholic RN ?Entered By: Dellie Catholic on 01/21/2022 18:37:42 ?-------------------------------------------------------------------------------- ?Encounter Discharge Information Details ?Patient Name: ?Date of Service: ?[name], RO [facility] 01/21/2022 8:00 A M ?Medical Record Number: [id number] ?Patient Account Number: [id number] ?Date of Birth/Sex: ?Treating RN: ?1956-09-10 (66 y.o. F) [name], [name] ?Primary Care Torrie Namba: [name] ?Other Clinician: ?Referring Brittania Sudbeck: ?Treating Rakin Lemelle/Extender: [name] ?[name] ?[name]: 0 ?Encounter Discharge Information Items Post Procedure Vitals ?Discharge Condition: Stable ?Temperature (F): 98.1 ?Ambulatory Status: [name] ?Pulse (bpm): 62 ?Discharge Destination: Home ?Respiratory Rate (breaths/min): 18 ?Transportation: Private Auto ?Blood Pressure (mmHg): 175/86 ?Accompanied By: self ?Schedule Follow-up Appointment: Yes ?Clinical Summary of Care: Patient Declined ?Electronic Signature(s) ?Signed: 01/22/2022 12:01:46 PM By: Dellie Catholic RN ?Entered By: Dellie Catholic on 01/21/2022 18:40:08 ?-------------------------------------------------------------------------------- ?Lower Extremity Assessment Details ?Patient Name: ?Date of Service: ?[name], RO [facility] 01/21/2022 8:00 A M ?Medical Record Number: [id number] ?Patient Account Number: [id number] ?Date of Birth/Sex: ?Treating RN: ?[birth year] (66 y.o. F) [name], [name] ?Primary Care Adriane Gabbert: [name] ?Other Clinician: ?Referring Lilton Pare: ?Treating Reece Mcbroom/Extender:  [name] ?[name] ?[name]: 0 ?Edema Assessment ?Assessed: [Left: No] [Right: No] ?E[Left: dema] [Right: :] ?Calf ?Left: Right: ?Point of Measurement: 31 cm From Medial Instep 48 cm ?Ankle ?Left: Right: ?Point of Measurement: 9 cm From Medial Instep 23 cm ?Knee To Floor ?Left: Right: ?From Medial Instep 45 cm 45 cm ?Vascular Assessment ?Pulses: ?Dorsalis Pedis ?Palpable: [Right:Yes] ?Blood Pressure: ?Brachial: [Right:175] ?Ankle: ?[Right:Dorsalis Pedis: 140 0.80] ?Electronic Signature(s) ?Signed: 01/22/2022 12:01:46 PM By: Dellie Catholic RN ?Entered By: Dellie Catholic on 01/21/2022 08:48:48 ?-------------------------------------------------------------------------------- ?Multi Wound Chart Details ?Patient Name: ?Date of Service: ?[name], RO [facility] 01/21/2022 8:00 A M ?Medical Record Number: [id number] ?Patient Account Number: [id number] ?Date of Birth/Sex: ?Treating RN: ?05/26/1956 (66 y.o. F) [name], [name] ?Primary Care Delaina Fetsch: [name] ?Other Clinician: ?Referring Suprina Mandeville: ?Treating Neizan Debruhl/Extender: [name] ?[name] ?[name]: 0 ?Vital Signs ?Height(in): 65 ?Pulse(bpm): 62 ?Weight(lbs): 235 ?Blood Pressure(mmHg): 175/86 ?Body Mass Index(BMI): 39.1 ?Temperature(??F): 98.1 ?Respiratory Rate(breaths/min): 18 ?Photos: [N/A:N/A] ?Right Metatarsal head fifth N/A N/A ?Wound Location: ?Gradually Appeared N/A N/A ?Wounding Event: ?Lymphedema N/A N/A ?Primary Etiology: ?Lymphedema, Hypertension, N/A N/A ?Comorbid History: ?Rheumatoid Arthritis, Neuropathy ?12/26/2021 N/A N/A ?Date Acquired: ?0 N/A N/A ?Weeks of Treatment: ?Open N/A N/A ?Wound Status: ?No N/A N/A ?Wound Recurrence: ?0.3x0.8x0.4 N/A N/A ?Measurements L x W x D (cm) ?0.188 N/A N/A ?A (cm?) : ?rea ?0.075 N/A N/A ?Volume (cm?) : ?0.00% N/A N/A ?% Reduction in A rea: ?0.00% N/A N/A ?% Reduction in Volume: ?12 ?Starting Position 1 (o'clock): ?12 ?Ending Position 1 (o'clock): ?0.2 ?Maximum  Distance 1 (cm): ?Yes N/A N/A ?Undermining: ?Full Thickness Without Exposed N/A N/A ?Classification: ?Support Structures ?Medium N/A N/A ?Exudate Amount: ?Sanguinous N/A N/A ?Exudate Type: ?red N/A N/A ?Exudate Color: ?Thickened N/A

## 2022-01-21 ENCOUNTER — Encounter (HOSPITAL_BASED_OUTPATIENT_CLINIC_OR_DEPARTMENT_OTHER): Payer: Medicare Other | Attending: General Surgery | Admitting: General Surgery

## 2022-01-21 ENCOUNTER — Other Ambulatory Visit: Payer: Self-pay

## 2022-01-21 DIAGNOSIS — I890 Lymphedema, not elsewhere classified: Secondary | ICD-10-CM | POA: Diagnosis not present

## 2022-01-21 DIAGNOSIS — L97512 Non-pressure chronic ulcer of other part of right foot with fat layer exposed: Secondary | ICD-10-CM | POA: Insufficient documentation

## 2022-01-21 DIAGNOSIS — E8881 Metabolic syndrome: Secondary | ICD-10-CM | POA: Insufficient documentation

## 2022-01-21 DIAGNOSIS — G609 Hereditary and idiopathic neuropathy, unspecified: Secondary | ICD-10-CM | POA: Insufficient documentation

## 2022-01-22 NOTE — Progress Notes (Signed)
Evelyn Campbell, Evelyn Campbell ([id number]) ?Visit Report for 01/21/2022 ?Chief Complaint Document Details ?Patient Name: Date of Service: ?[name], RO [facility] 01/21/2022 8:00 A M ?Medical Record Number: [id number] ?Patient Account Number: [id number] ?Date of Birth/Sex: Treating RN: ?[birth year] (66 y.o. F) Campbell, Evelyn Claude ?Primary Care Provider: [name] Other Clinician: ?Referring Provider: ?Treating Provider/Extender: [name] ?[name] ?[name]: 0 ?Information Obtained from: Patient ?Chief Complaint ?Patient seen for complaints of Non-Healing Wound. ?Electronic Signature(s) ?Signed: 01/21/2022 9:54:19 AM By: [name] MD FACS ?Entered By: [name] on 01/21/2022 09:54:19 ?-------------------------------------------------------------------------------- ?Debridement Details ?Patient Name: Date of Service: ?[name], RO [facility] 01/21/2022 8:00 A M ?Medical Record Number: [id number] ?Patient Account Number: [id number] ?Date of Birth/Sex: Treating RN: ?1956-10-06 (66 y.o. F) Campbell, Evelyn Claude ?Primary Care Provider: [name] Other Clinician: ?Referring Provider: ?Treating Provider/Extender: [name] ?[name] ?[name]: 0 ?Debridement Performed for Assessment: Wound #1 Right Metatarsal head fifth ?Performed By: Physician [name], MD ?Debridement Type: Debridement ?Level of Consciousness (Pre-procedure): Awake and Alert ?Pre-procedure Verification/Time Out Yes - 08:50 ?Taken: ?Start Time: 08:50 ?Pain Control: ?Other : Benzocaine ?T Area Debrided (L x W): ?otal 0.3 (cm) x 0.5 (cm) = 0.15 (cm?) ?Tissue and other material debrided: Non-Viable, Callus, [place], [place] ?Level: Non-Viable Tissue ?Debridement Description: Selective/Open Wound ?Instrument: Curette ?Bleeding: Minimum ?Hemostasis Achieved: Pressure ?End Time: 08:59 ?Procedural Pain: 0 ?Post Procedural Pain: 0 ?Response to Treatment: Procedure was tolerated well ?Level of Consciousness (Post- Awake and  Alert ?procedure): ?Post Debridement Measurements of Total Wound ?Length: (cm) 0.3 ?Width: (cm) 0.8 ?Depth: (cm) 0.4 ?Volume: (cm?) 0.075 ?Character of Wound/Ulcer Post Debridement: Improved ?Post Procedure Diagnosis ?Same as Pre-procedure ?Electronic Signature(s) ?Signed: 01/21/2022 12:32:07 PM By: [name] MD FACS ?Signed: 01/22/2022 12:01:46 PM By: Dellie Catholic RN ?Entered By: Dellie Catholic on 01/21/2022 09:06:07 ?-------------------------------------------------------------------------------- ?HPI Details ?Patient Name: Date of Service: ?[name], RO [facility] 01/21/2022 8:00 A M ?Medical Record Number: [id number] ?Patient Account Number: [id number] ?Date of Birth/Sex: Treating RN: ?[birth year] (66 y.o. F) Campbell, Evelyn Claude ?Primary Care Provider: [name] Other Clinician: ?Referring Provider: ?Treating Provider/Extender: [name] ?[name] ?[name]: 0 ?History of Present Illness ?HPI Description: ADMISSION ?01/21/2022 ?This is a 66 year old woman presenting with an ulcer on the right fifth metatarsal head. She says that it has been present for about 6 weeks. She is not certain ?how it developed, but she does have peripheral neuropathy. She also has lymphedema, hypertension, metabolic syndrome/prediabetes, but does not carry an ?actual diagnosis of diabetes nor is she on any medications for this. 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 ABI in ?clinic today was 0.8. She has been treated in the past for lymphedema, but has been unable to tolerate compression. She does have lymphedema pumps but ?does not use these regularly. She had been applying Neosporin and new skin to her wound, but it did not really change much. She was referred here by her ?primary care provider for further evaluation and management. ?Electronic Signature(s) ?Signed: 01/21/2022 9:56:56 AM By: [name] MD FACS ?Entered By: [name] on 01/21/2022  09:56:56 ?-------------------------------------------------------------------------------- ?Chemical Cauterization Details ?Patient Name: Date of Service: ?[name], RO [facility] 01/21/2022 8:00 A M ?Medical Record Number: [id number] ?Patient Account Number: [id number] ?Date of Birth/Sex: Treating RN: ?May 22, 1956 (65 y.o. F) Campbell, Evelyn Claude ?Primary Care Provider: [name] Other Clinician: ?Referring Provider: ?Treating Provider/Extender: [name] ?[name] ?[name]: 0 ?Procedure Performed for: Wound #1 Right Metatarsal head  fifth ?Performed By: Physician [name], MD ?Post Procedure Diagnosis ?Same as Pre-procedure ?Electronic Signature(s) ?Signed: 01/22/2022 7:28:44 AM By: [name] MD FACS ?Signed: 01/22/2022 12:01:46 PM By: Dellie Catholic RN ?Entered By: Dellie Catholic on 01/21/2022 18:34:52 ?-------------------------------------------------------------------------------- ?Physical Exam Details ?Patient Name: Date of Service: ?[name], RO [facility] 01/21/2022 8:00 A M ?Medical Record Number: [id number] ?Patient Account Number: [id number] ?Date of Birth/Sex: Treating RN: ?05-26-1956 (66 y.o. F) Campbell, Evelyn Claude ?Primary Care Provider: [name] Other Clinician: ?Referring Provider: ?Treating Provider/Extender: [name] ?[name] ?[name]: 0 ?Constitutional ?Hypertensive, asymptomatic.. . . . No acute distress. ?Respiratory ?Normal work of breathing on room air.Marland Kitchen ?Cardiovascular ?2+ right dorsalis pedis; unable to palpate posterior tibialis. 3+ nonpitting edema at least to the knees bilaterally. ?Notes ?01/21/2022: Wound examon the plantar surface of her right fifth metatarsal head, there is an open wound with exposed fat layer. There is substantial callus ?buildup surrounding the wound. The wound bed itself appears clean. There is granulation tissue present that is actually a bit hypertrophic. ?Electronic Signature(s) ?Signed: 01/21/2022 10:20:27  AM By: [name] MD FACS ?Entered By: [name] on 01/21/2022 10:20:26 ?-------------------------------------------------------------------------------- ?Physician Orders Details ?Patient Name: Date of Service: ?[name], RO [facility] 01/21/2022 8:00 A M ?Medical Record Number: [id number] ?Patient Account Number: [id number] ?Date of Birth/Sex: Treating RN: ?1956/09/16 (66 y.o. F) Campbell, Evelyn Claude ?Primary Care Provider: [name] Other Clinician: ?Referring Provider: ?Treating Provider/Extender: [name] ?[name] ?[name]: 0 ?Verbal / Phone Orders: No ?Diagnosis Coding ?ICD-10 Coding ?Code Description ?L97.512 Non-pressure chronic ulcer of other part of right foot with fat layer exposed ?G60.9 Hereditary and idiopathic neuropathy, unspecified ?I89.0 Lymphedema, not elsewhere classified ?E66.01 Morbid (severe) obesity due to excess calories ?K02.54 Metabolic syndrome ?R73.03 Prediabetes ?Follow-up Appointments ?ppointment in 1 week. - Dr Celine Ahr ?Return A ?Bathing/ Shower/ Hygiene ?May shower with protection but do not get wound dressing(s) wet. - Please use cast protector (Can purchase cast protector from local ?pharmacy, such as [facility] or [facility], cost is about $18-$20) ?Edema Control - Lymphedema / SCD / Other ?Elevate legs to the level of the heart or above for 30 minutes daily and/or when sitting, a frequency of: ?Avoid standing for long periods of time. ?Patient to wear own compression stockings every day. ?Exercise regularly ?Off-Loading ?Other: - Offloading shoe on Right foot ?Home Health ?Admit to [place] for wound care. May utilize formulary equivalent dressing for wound treatment orders unless otherwise specified. ?Wound Treatment ?Wound #1 - Metatarsal head fifth Wound Laterality: Right ?Cleanser: Soap and Water Every Other Day/15 Days ?Discharge Instructions: May shower and wash wound with dial antibacterial soap and water prior to dressing change. ?Cleanser:  Wound Cleanser (DME) (Generic) Every Other Day/15 Days ?Discharge Instructions: Cleanse the wound with wound cleanser prior to applying a clean dressing using gauze sponges, not tissue or cotton balls. ?Cleanser:

## 2022-01-22 NOTE — Progress Notes (Signed)
Evelyn Campbell, Evelyn Campbell ([id number]) ?Visit Report for 01/21/2022 ?Abuse Risk Screen Details ?Patient Name: Date of Service: ?[name], RO [facility] 01/21/2022 8:00 A M ?Medical Record Number: [id number] ?Patient Account Number: [id number] ?Date of Birth/Sex: Treating RN: ?[birth year] (66 y.o. F) Evelyn Campbell ?Primary Care Evelyn Campbell: Evelyn Campbell Other Clinician: ?Referring Evelyn Campbell: ?Treating Evelyn Campbell/Extender: Evelyn Campbell ?Evelyn Campbell ?[name]: 0 ?Abuse Risk Screen Items ?Answer ?ABUSE RISK SCREEN: ?Has anyone close to you tried to hurt or harm you recentlyo No ?Do you feel uncomfortable with anyone in your familyo No ?Has anyone forced you do things that you didnt want to doo No ?Electronic Signature(s) ?Signed: 01/22/2022 12:01:46 PM By: Dellie Catholic RN ?Entered By: Dellie Catholic on 01/21/2022 08:27:35 ?-------------------------------------------------------------------------------- ?Activities of Daily Living Details ?Patient Name: Date of Service: ?[name], RO [facility] 01/21/2022 8:00 A M ?Medical Record Number: [id number] ?Patient Account Number: [id number] ?Date of Birth/Sex: Treating RN: ?Sep 03, 1956 (66 y.o. F) Evelyn Campbell ?Primary Care Evelyn Campbell: Evelyn Campbell Other Clinician: ?Referring Evelyn Campbell: ?Treating Evelyn Campbell/Extender: Evelyn Campbell ?Evelyn Campbell ?[name]: 0 ?Activities of Daily Living Items ?Answer ?Activities of Daily Living (Please select one for each item) ?[place] ?T Medications ?ake Completely Able ?Use T elephone Completely Able ?Care for Appearance Completely Able ?Use T oilet Completely Able ?Bath / Shower Completely Able ?Dress Self Completely Able ?Feed Self Completely Able ?Walk Completely Able ?Get In / Out Bed Completely Able ?Housework Completely Able ?Prepare Meals Completely Able ?Handle Money Completely Able ?Shop for Self Completely Able ?Electronic Signature(s) ?Signed: 01/22/2022 12:01:46 PM By: Dellie Catholic RN ?Entered  By: Dellie Catholic on 01/21/2022 08:28:15 ?-------------------------------------------------------------------------------- ?Education Screening Details ?Patient Name: ?Date of Service: ?[name], RO [facility] 01/21/2022 8:00 A M ?Medical Record Number: [id number] ?Patient Account Number: [id number] ?Date of Birth/Sex: ?Treating RN: ?12-11-1955 (66 y.o. F) Evelyn Campbell ?Primary Care Evelyn Campbell: Evelyn Campbell ?Other Clinician: ?Referring Evelyn Campbell: ?Treating Evelyn Campbell/Extender: Evelyn Campbell ?Evelyn Campbell ?[name]: 0 ?Learning Preferences/Education Level/Primary Language ?Learning Preference: Explanation, Demonstration, Printed Material ?Highest Education Level: College or Above ?Preferred Language: English ?Cognitive Barrier ?Language Barrier: No ?Translator Needed: No ?Memory Deficit: No ?Emotional Barrier: No ?Cultural/Religious Beliefs Affecting Medical Care: No ?Physical Barrier ?Impaired Vision: No ?Impaired Hearing: No ?Decreased Hand dexterity: No ?Knowledge/Comprehension ?Knowledge Level: High ?Comprehension Level: High ?Ability to understand written instructions: High ?Ability to understand verbal instructions: High ?Motivation ?Anxiety Level: Calm ?Cooperation: Cooperative ?Education Importance: Acknowledges Need ?Interest in Health Problems: Asks Questions ?Perception: Coherent ?Willingness to Engage in Self-Management High ?Activities: ?Readiness to Engage in Self-Management High ?Activities: ?Electronic Signature(s) ?Signed: 01/22/2022 12:01:46 PM By: Dellie Catholic RN ?Entered By: Dellie Catholic on 01/21/2022 08:29:47 ?-------------------------------------------------------------------------------- ?Fall Risk Assessment Details ?Patient Name: ?Date of Service: ?[name], RO [facility] 01/21/2022 8:00 A M ?Medical Record Number: [id number] ?Patient Account Number: [id number] ?Date of Birth/Sex: ?Treating RN: ?Jul 03, 1956 (66 y.o. F) Evelyn Campbell ?Primary Care Evelyn Campbell: Evelyn Campbell ?Other  Clinician: ?Referring Evelyn Campbell: ?Treating Evelyn Campbell/Extender: Evelyn Campbell ?Evelyn Campbell ?[name]: 0 ?Fall Risk Assessment Items ?Have you had 2 or more falls in the last 12 monthso 0 No ?Have you had any fall that resulted in injury in the last 12 monthso 0 No ?FALLS RISK SCREEN ?History of falling - immediate or within 3 months 0 No ?Secondary diagnosis (Do you have 2 or more medical diagnoseso) 0 No ?Ambulatory aid ?None/bed rest/wheelchair/nurse 0 No ?Crutches/cane/walker 15 Yes ?Furniture 30 Yes ?Intravenous therapy Access/Saline/Heparin Lock 0 No ?Gait/Transferring ?Normal/ bed rest/ wheelchair 0  No ?Weak (short steps with or without shuffle, stooped but able to lift head while walking, may seek 0 No ?support from furniture) ?Impaired (short steps with shuffle, may have difficulty arising from chair, head down, impaired 0 No ?balance) ?Mental Status ?Oriented to own ability 0 No ?Electronic Signature(s) ?Signed: 01/22/2022 12:01:46 PM By: Dellie Catholic RN ?Entered By: Dellie Catholic on 01/21/2022 08:30:22 ?-------------------------------------------------------------------------------- ?Foot Assessment Details ?Patient Name: ?Date of Service: ?[name], RO [facility] 01/21/2022 8:00 A M ?Medical Record Number: [id number] ?Patient Account Number: [id number] ?Date of Birth/Sex: ?Treating RN: ?07/06/1956 (66 y.o. F) Evelyn Campbell ?Primary Care Evelyn Campbell: Evelyn Campbell ?Other Clinician: ?Referring Evelyn Campbell: ?Treating Evelyn Campbell/Extender: Evelyn Campbell ?Evelyn Campbell ?[name]: 0 ?Foot Assessment Items ?Site Locations ?+ = Sensation present, - = Sensation absent, C = Callus, U = Ulcer ?R = Redness, W = Warmth, M = Maceration, PU = Pre-ulcerative lesion ?F = Fissure, S = Swelling, D = Dryness ?Assessment ?Right: Left: ?Other Deformity: No No ?Prior Foot Ulcer: No No ?Prior Amputation: No No ?Charcot Joint: No No ?Ambulatory Status: Ambulatory Without Help ?Gait: Steady ?Electronic  Signature(s) ?Signed: 01/22/2022 12:01:46 PM By: Dellie Catholic RN ?Entered By: Dellie Catholic on 01/21/2022 08:33:24 ?-------------------------------------------------------------------------------- ?Nutrition Risk Screening Details ?Patient Name: ?Date of Service: ?[name], RO [facility] 01/21/2022 8:00 A M ?Medical Record Number: [id number] ?Patient Account Number: [id number] ?Date of Birth/Sex: ?Treating RN: ?06/01/1956 (66 y.o. F) Evelyn Campbell ?Primary Care Tobey Schmelzle: Evelyn Campbell ?Other Clinician: ?Referring Aryeh Butterfield: ?Treating Keta [place]/Extender: Evelyn Campbell ?Evelyn Campbell ?[name]: 0 ?Height (in): 65 ?Weight (lbs): 235 ?Body Mass Index (BMI): 39.1 ?Nutrition Risk Screening Items ?Score Screening ?NUTRITION RISK SCREEN: ?I have an illness or condition that made me change the kind and/or amount of food I eat 0 No ?I eat fewer than two meals per day 0 No ?I eat few fruits and vegetables, or milk products 0 No ?I have three or more drinks of beer, liquor or wine almost every day 0 No ?I have tooth or mouth problems that make it hard for me to eat 0 No ?I don't always have enough money to buy the food I need 0 No ?I eat alone most of the time 0 No ?I take three or more different prescribed or over-the-counter drugs a day 1 Yes ?Without wanting to, I have lost or gained 10 pounds in the last six months 0 No ?I am not always physically able to shop, cook and/or feed myself 0 No ?Nutrition Protocols ?Good Risk Protocol 0 No interventions needed ?Moderate Risk Protocol ?High Risk Proctocol ?Risk Level: Good Risk ?Score: 1 ?Electronic Signature(s) ?Signed: 01/22/2022 12:01:46 PM By: Dellie Catholic RN ?Entered By: Dellie Catholic on 01/21/2022 08:31:05 ?

## 2022-01-26 ENCOUNTER — Other Ambulatory Visit (HOSPITAL_COMMUNITY): Payer: Self-pay | Admitting: General Surgery

## 2022-01-26 DIAGNOSIS — L97512 Non-pressure chronic ulcer of other part of right foot with fat layer exposed: Secondary | ICD-10-CM

## 2022-01-27 ENCOUNTER — Ambulatory Visit (HOSPITAL_COMMUNITY)
Admission: RE | Admit: 2022-01-27 | Discharge: 2022-01-27 | Disposition: A | Discharge status: Home / Self Care | Payer: Medicare Other | Source: Ambulatory Visit | Attending: General Surgery | Admitting: General Surgery

## 2022-01-27 DIAGNOSIS — L97512 Non-pressure chronic ulcer of other part of right foot with fat layer exposed: Secondary | ICD-10-CM | POA: Insufficient documentation

## 2022-01-28 ENCOUNTER — Encounter (HOSPITAL_BASED_OUTPATIENT_CLINIC_OR_DEPARTMENT_OTHER): Payer: Medicare Other | Admitting: General Surgery

## 2022-01-28 DIAGNOSIS — L97512 Non-pressure chronic ulcer of other part of right foot with fat layer exposed: Secondary | ICD-10-CM | POA: Diagnosis not present

## 2022-01-28 NOTE — Progress Notes (Signed)
TAYSIA, RIVERE ([id number]) ?Visit Report for 01/28/2022 ?Chief Complaint Document Details ?Patient Name: Date of Service: ?[name], RO [facility] 01/28/2022 3:15 PM ?Medical Record Number: [id number] ?Patient Account Number: [id number] ?Date of Birth/Sex: Treating RN: ?[birth year] (66 y.o. F) [name], [name] ?Primary Care Provider: [name] Other Clinician: ?Referring Provider: ?Treating Provider/Extender: [name] ?[name] ?[name]: 1 ?Information Obtained from: Patient ?Chief Complaint ?Patient seen for complaints of Non-Healing Wound. ?Electronic Signature(s) ?Signed: 01/28/2022 4:00:47 PM By: [name] MD FACS ?Entered By: [name] on 01/28/2022 16:00:47 ?-------------------------------------------------------------------------------- ?Debridement Details ?Patient Name: Date of Service: ?[name], RO [facility] 01/28/2022 3:15 PM ?Medical Record Number: [id number] ?Patient Account Number: [id number] ?Date of Birth/Sex: Treating RN: ?Aug 06, 1956 (66 y.o. F) [name], [name] ?Primary Care Provider: [name] Other Clinician: ?Referring Provider: ?Treating Provider/Extender: [name] ?[name] ?[name]: 1 ?Debridement Performed for Assessment: Wound #1 Right Metatarsal head fifth ?Performed By: Physician [name], MD ?Debridement Type: Debridement ?Level of Consciousness (Pre-procedure): Awake and Alert ?Pre-procedure Verification/Time Out Yes - 15:53 ?Taken: ?Start Time: 15:53 ?Pain Control: Lidocaine 5% topical ointment ?T Area Debrided (L x W): ?otal 0.3 (cm) x 0.6 (cm) = 0.18 (cm?) ?Tissue and other material debrided: Non-Viable, Callus, [place], [place] ?Level: Non-Viable Tissue ?Debridement Description: Selective/Open Wound ?Instrument: Curette ?Bleeding: Minimum ?Hemostasis Achieved: Pressure ?End Time: 15:55 ?Procedural Pain: 0 ?Post Procedural Pain: 0 ?Response to Treatment: Procedure was tolerated well ?Level of Consciousness (Post-  Awake and Alert ?procedure): ?Post Debridement Measurements of Total Wound ?Length: (cm) 0.3 ?Width: (cm) 0.6 ?Depth: (cm) 0.1 ?Volume: (cm?) 0.014 ?Character of Wound/Ulcer Post Debridement: Improved ?Post Procedure Diagnosis ?Same as Pre-procedure ?Electronic Signature(s) ?Signed: 01/28/2022 5:23:58 PM By: Dellie Catholic RN ?Signed: 01/28/2022 5:31:11 PM By: [name] MD FACS ?Entered By: Dellie Catholic on 01/28/2022 15:57:11 ?-------------------------------------------------------------------------------- ?HPI Details ?Patient Name: Date of Service: ?[name], RO [facility] 01/28/2022 3:15 PM ?Medical Record Number: [id number] ?Patient Account Number: [id number] ?Date of Birth/Sex: Treating RN: ?1956-05-13 (66 y.o. F) [name], [name] ?Primary Care Provider: [name] Other Clinician: ?Referring Provider: ?Treating Provider/Extender: [name] ?[name] ?[name]: 1 ?History of Present Illness ?HPI Description: ADMISSION ?01/21/2022 ?This is a 65 year old woman presenting with an ulcer on the right fifth metatarsal head. She says that it has been present for about 6 weeks. She is not certain ?how it developed, but she does have peripheral neuropathy. She also has lymphedema, hypertension, metabolic syndrome/prediabetes, but does not carry an ?actual diagnosis of diabetes nor is she on any medications for this. 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 ABI in ?clinic today was 0.8. She has been treated in the past for lymphedema, but has been unable to tolerate compression. She does have lymphedema pumps but ?does not use these regularly. She had been applying Neosporin and new skin to her wound, but it did not really change much. She was referred here by her ?primary care provider for further evaluation and management. ?01/28/2022: 1 week follow-up. Apparently there was some miscommunication with home health and they did not come out to help her  with her dressing changes. ?The patient and her husband have been doing them themselves. The wound actually looks quite good, with decreased dimensions and a very nice healthy ?surface. ?Electronic Signature(s) ?Signed: 01/28/2022 4:01:39 PM By: [name] MD FACS ?Entered By: [name] on 01/28/2022 16:01:39 ?-------------------------------------------------------------------------------- ?Physical Exam Details ?Patient Name: Date of Service: ?[name], RO [facility] 01/28/2022 3:15 PM ?Medical  Record Number: [id number] ?Patient Account Number: [id number] ?Date of Birth/Sex: Treating RN: ?25-Jul-1956 (66 y.o. F) [name], [name] ?Primary Care Provider: [name] Other Clinician: ?Referring Provider: ?Treating Provider/Extender: [name] ?[name] ?[name]: 1 ?Constitutional ?. . . . No acute distress. ?Respiratory ?Normal work of breathing on room air. ?Notes ?01/28/2022: Wound examon the plantar surface of her right fifth metatarsal head, there is a small wound with minimal surrounding callus. Good granulation ?tissue at the base. No concern for infection. ?Electronic Signature(s) ?Signed: 01/28/2022 4:02:46 PM By: [name] MD FACS ?Signed: 01/28/2022 4:02:46 PM By: [name] MD FACS ?Entered By: [name] on 01/28/2022 16:02:46 ?-------------------------------------------------------------------------------- ?Physician Orders Details ?Patient Name: ?Date of Service: ?[name], RO [facility] 01/28/2022 3:15 PM ?Medical Record Number: [id number] ?Patient Account Number: [id number] ?Date of Birth/Sex: ?Treating RN: ?1956-03-05 (66 y.o. F) [name], [name] ?Primary Care Provider: [name] ?Other Clinician: ?Referring Provider: ?Treating Provider/Extender: [name] ?[name] ?[name]: 1 ?Verbal / Phone Orders: No ?Diagnosis Coding ?ICD-10 Coding ?Code Description ?L97.512 Non-pressure chronic ulcer of other part of right foot with fat layer  exposed ?G60.9 Hereditary and idiopathic neuropathy, unspecified ?I89.0 Lymphedema, not elsewhere classified ?E66.01 Morbid (severe) obesity due to excess calories ?G71.59 Metabolic syndrome ?R73.03 Prediabetes ?Follow-up Appointments ?ppointment in 1 week. - Dr Celine Ahr ?Return A ?Bathing/ Shower/ Hygiene ?May shower with protection but do not get wound dressing(s) wet. - Please use cast protector (Can purchase cast protector from local ?pharmacy, such as [facility] or [facility], cost is about $18-$20) ?Edema Control - Lymphedema / SCD / Other ?Elevate legs to the level of the heart or above for 30 minutes daily and/or when sitting, a frequency of: ?Avoid standing for long periods of time. ?Patient to wear own compression stockings every day. ?Exercise regularly ?Off-Loading ?Other: - Offloading shoe on Right foot ?Home Health ?dmit to [place] for wound care. May utilize formulary equivalent dressing for wound treatment orders unless otherwise specified. - ?A ?[facility] ?Wound Treatment ?Wound #1 - Metatarsal head fifth Wound Laterality: Right ?Cleanser: Soap and Water Every Other Day/15 Days ?Discharge Instructions: May shower and wash wound with dial antibacterial soap and water prior to dressing change. ?Cleanser: Wound Cleanser (Generic) Every Other Day/15 Days ?Discharge Instructions: Cleanse the wound with wound cleanser prior to applying a clean dressing using gauze sponges, not tissue or cotton balls. ?Cleanser: [place] Ancillary Kit - 15 Day Supply (Generic) Every Other Day/15 Days ?Discharge Instructions: Use supplies as instructed; Kit contains: (15) Saline Bullets; (15) 3x3 Gauze; 15 pr Gloves ?Peri-Wound Care: Zinc Oxide Ointment 30g tube Every Other Day/15 Days ?Discharge Instructions: Apply Zinc Oxide to periwound with each dressing change ?Prim Dressing: Promogran Prisma Matrix, 4.34 (sq in) (silver collagen) (Generic) Every Other Day/15 Days ?ary ?Discharge Instructions: Moisten collagen with saline or  hydrogel ?Prim Dressing: Optifoam Non-Adhesive Dressing, 4x4 in (Generic) Every Other Day/15 Days ?ary ?Discharge Instructions: Apply to wound as a "donut" ?Secondary Dressing: Woven Gauze Sponge, Non-St

## 2022-01-28 NOTE — Progress Notes (Signed)
Evelyn Campbell, Evelyn Campbell ([id number]) ?Visit Report for 01/28/2022 ?Arrival Information Details ?Patient Name: Date of Service: ?[name], RO [facility] 01/28/2022 3:15 PM ?Medical Record Number: [id number] ?Patient Account Number: [id number] ?Date of Birth/Sex: Treating RN: ?[birth year] (66 y.o. F) [name], Evelyn Campbell ?Primary Care Tayloranne Lekas: [name] Other Clinician: ?Referring Geovanie Winnett: ?Treating Tamyrah Burbage/Extender: [name] ?[name] ?[name]: 1 ?Visit Information History Since Last Visit ?Added or deleted any medications: No ?Patient Arrived: Evelyn Campbell ?Any new allergies or adverse reactions: No ?Arrival Time: 15:35 ?Had a fall or experienced change in No ?Accompanied By: spouse ?activities of daily living that may affect ?Transfer Assistance: None ?risk of falls: ?Patient Identification Verified: Yes ?Signs or symptoms of abuse/neglect since last visito No ?Patient Has Alerts: Yes ?Hospitalized since last visit: No ?Patient Alerts: ABI R 1.19 ?Implantable device outside of the clinic excluding No ?ABI L 1.12 ?cellular tissue based products placed in the center ?TBI R 0.93 ?since last visit: ?TBI L 0.56 ?Has Dressing in Place as Prescribed: Yes ?Pain Present Now: Yes ?Electronic Signature(s) ?Signed: 01/28/2022 5:23:58 PM By: Dellie Catholic RN ?Entered By: Dellie Catholic on 01/28/2022 15:59:17 ?-------------------------------------------------------------------------------- ?Encounter Discharge Information Details ?Patient Name: Date of Service: ?[name], RO [facility] 01/28/2022 3:15 PM ?Medical Record Number: [id number] ?Patient Account Number: [id number] ?Date of Birth/Sex: Treating RN: ?29-Apr-1956 (66 y.o. F) [name], Evelyn Campbell ?Primary Care Michaelpaul Apo: [name] Other Clinician: ?Referring Jezelle Gullick: ?Treating Andre Swander/Extender: [name] ?[name] ?[name]: 1 ?Encounter Discharge Information Items Post Procedure Vitals ?Discharge Condition: Stable ?Temperature (F): 97.9 ?Ambulatory  Status: Evelyn Campbell ?Pulse (bpm): 65 ?Discharge Destination: Home ?Respiratory Rate (breaths/min): 16 ?Transportation: Private Auto ?Blood Pressure (mmHg): 122/79 ?Accompanied By: spouse ?Schedule Follow-up Appointment: Yes ?Clinical Summary of Care: Patient Declined ?Electronic Signature(s) ?Signed: 01/28/2022 5:23:58 PM By: Dellie Catholic RN ?Entered By: Dellie Catholic on 01/28/2022 17:23:20 ?-------------------------------------------------------------------------------- ?Lower Extremity Assessment Details ?Patient Name: ?Date of Service: ?[name], RO [facility] 01/28/2022 3:15 PM ?Medical Record Number: [id number] ?Patient Account Number: [id number] ?Date of Birth/Sex: ?Treating RN: ?10/03/1956 (66 y.o. F) [name], Evelyn Campbell ?Primary Care Torrence Hammack: [name] ?Other Clinician: ?Referring Micaila Ziemba: ?Treating Flavius Repsher/Extender: [name] ?[name] ?[name]: 1 ?Edema Assessment ?Assessed: [Left: No] [Right: No] ?E[Left: dema] [Right: :] ?Calf ?Left: Right: ?Point of Measurement: 31 cm From Medial Instep 47 cm ?Ankle ?Left: Right: ?Point of Measurement: 9 cm From Medial Instep 24 cm ?Electronic Signature(s) ?Signed: 01/28/2022 5:23:58 PM By: Dellie Catholic RN ?Entered By: Dellie Catholic on 01/28/2022 15:52:56 ?-------------------------------------------------------------------------------- ?Multi Wound Chart Details ?Patient Name: ?Date of Service: ?[name], RO [facility] 01/28/2022 3:15 PM ?Medical Record Number: [id number] ?Patient Account Number: [id number] ?Date of Birth/Sex: ?Treating RN: ?1956-01-21 (66 y.o. F) [name], Evelyn Campbell ?Primary Care Demarie Uhlig: [name] ?Other Clinician: ?Referring Cristal Qadir: ?Treating Hollyann Pablo/Extender: [name] ?[name] ?[name]: 1 ?Vital Signs ?Height(in): 65 ?Pulse(bpm): 65 ?Weight(lbs): 235 ?Blood Pressure(mmHg): 122/79 ?Body Mass Index(BMI): 39.1 ?Temperature(??F): 97.9 ?Respiratory Rate(breaths/min): 16 ?Photos: [N/A:N/A] ?Right  Metatarsal head fifth N/A N/A ?Wound Location: ?Gradually Appeared N/A N/A ?Wounding Event: ?Lymphedema N/A N/A ?Primary Etiology: ?Lymphedema, Hypertension, N/A N/A ?Comorbid History: ?Rheumatoid Arthritis, Neuropathy ?12/26/2021 N/A N/A ?Date Acquired: ?1 N/A N/A ?Weeks of Treatment: ?Open N/A N/A ?Wound Status: ?No N/A N/A ?Wound Recurrence: ?0.3x0.6x0.1 N/A N/A ?Measurements L x W x D (cm) ?0.141 N/A N/A ?A (cm?) : ?rea ?0.014 N/A N/A ?Volume (cm?) : ?25.00% N/A N/A ?% Reduction in Area: ?81.30% N/A N/A ?% Reduction in Volume: ?Full Thickness Without Exposed N/A N/A ?Classification: ?Support  Structures ?Medium N/A N/A ?Exudate A mount: ?Sanguinous N/A N/A ?Exudate Type: ?red N/A N/A ?Exudate Color: ?Thickened N/A N/A ?Wound Margin: ?Small (1-33%) N/A N/A ?Granulation A mount: ?Pink N/A N/A ?Granulation Quality: ?Large (67-100%) N/A N/A ?Necrotic A mount: ?Fat Layer (Subcutaneous Tissue): Yes N/A N/A ?Exposed Structures: ?Fascia: No ?Tendon: No ?Muscle: No ?Joint: No ?Bone: No ?None N/A N/A ?Epithelialization: ?Debridement - Selective/Open Wound N/A N/A ?Debridement: ?Pre-procedure Verification/Time Out 15:53 N/A N/A ?Taken: ?Lidocaine 5% topical ointment N/A N/A ?Pain Control: ?Callus, Slough N/A N/A ?Tissue Debrided: ?Non-Viable Tissue N/A N/A ?Level: ?0.18 N/A N/A ?Debridement A (sq cm): ?rea ?Curette N/A N/A ?Instrument: ?Minimum N/A N/A ?Bleeding: ?Pressure N/A N/A ?Hemostasis A chieved: ?0 N/A N/A ?Procedural Pain: ?0 N/A N/A ?Post Procedural Pain: ?Procedure was tolerated well N/A N/A ?Debridement Treatment Response: ?0.3x0.6x0.1 N/A N/A ?Post Debridement Measurements L x ?W x D (cm) ?0.014 N/A N/A ?Post Debridement Volume: (cm?) ?Debridement N/A N/A ?Procedures Performed: ?Treatment Notes ?Electronic Signature(s) ?Signed: 01/28/2022 4:00:39 PM By: [name] MD FACS ?Signed: 01/28/2022 5:23:58 PM By: Dellie Catholic RN ?Entered By: [name] on 01/28/2022  16:00:39 ?-------------------------------------------------------------------------------- ?Multi-Disciplinary Care Plan Details ?Patient Name: ?Date of Service: ?[name], RO [facility] 01/28/2022 3:15 PM ?Medical Record Number: [id number] ?Patient Account Number: [id number] ?Date of Birth/Sex: ?Treating RN: ?12-Feb-1956 (66 y.o. F) [name], Evelyn Campbell ?Primary Care Tanaiya Kolarik: [name] ?Other Clinician: ?Referring Tiyona Desouza: ?Treating Hudson Majkowski/Extender: [name] ?[name] ?[name]: 1 ?Active Inactive ?Wound/Skin Impairment ?Nursing Diagnoses: ?Impaired tissue integrity ?Knowledge deficit related to ulceration/compromised skin integrity ?Goals: ?Patient/caregiver will verbalize understanding of skin care regimen ?Date Initiated: 01/21/2022 ?Target Resolution Date: 02/22/2022 ?Goal Status: Active ?Interventions: ?Assess patient/caregiver ability to obtain necessary supplies ?Assess patient/caregiver ability to perform ulcer/skin care regimen upon admission and as needed ?Assess ulceration(s) every visit ?Provide education on ulcer and skin care ?Treatment Activities: ?Referred to DME Ziah Leandro for dressing supplies : 01/21/2022 ?Skin care regimen initiated : 01/21/2022 ?Notes: ?Electronic Signature(s) ?Signed: 01/28/2022 5:23:58 PM By: Dellie Catholic RN ?Entered By: Dellie Catholic on 01/28/2022 17:21:30 ?-------------------------------------------------------------------------------- ?Pain Assessment Details ?Patient Name: ?Date of Service: ?[name], RO [facility] 01/28/2022 3:15 PM ?Medical Record Number: [id number] ?Patient Account Number: [id number] ?Date of Birth/Sex: ?Treating RN: ?11/09/1955 (66 y.o. F) [name], Evelyn Campbell ?Primary Care Perle Gibbon: [name] ?Other Clinician: ?Referring Gurnie Duris: ?Treating Franco Duley/Extender: [name] ?[name] ?[name]: 1 ?Active Problems ?Location of Pain Severity and Description of Pain ?Patient Has Paino Yes ?Site Locations ?Pain  Location: ?Generalized Pain ?With Dressing Change: No ?Duration of the Pain. ?Constant / Intermittento Constant ?Rate the pain. ?Current Pain Level: 8 ?Worst Pain Level: 10 ?Least Pain Level: 3 ?Tolerable Pain Level: 3 ?Character of Pain ?Describe the Pain:

## 2022-01-29 ENCOUNTER — Encounter (HOSPITAL_BASED_OUTPATIENT_CLINIC_OR_DEPARTMENT_OTHER): Payer: Medicare Other | Admitting: General Surgery

## 2022-02-03 ENCOUNTER — Ambulatory Visit (INDEPENDENT_AMBULATORY_CARE_PROVIDER_SITE_OTHER): Payer: Medicare Other | Admitting: Family Medicine

## 2022-02-05 ENCOUNTER — Encounter (HOSPITAL_BASED_OUTPATIENT_CLINIC_OR_DEPARTMENT_OTHER): Payer: Medicare Other | Attending: General Surgery | Admitting: General Surgery

## 2022-02-05 DIAGNOSIS — I890 Lymphedema, not elsewhere classified: Secondary | ICD-10-CM | POA: Insufficient documentation

## 2022-02-05 DIAGNOSIS — I10 Essential (primary) hypertension: Secondary | ICD-10-CM | POA: Diagnosis not present

## 2022-02-05 DIAGNOSIS — G609 Hereditary and idiopathic neuropathy, unspecified: Secondary | ICD-10-CM | POA: Diagnosis not present

## 2022-02-05 DIAGNOSIS — M069 Rheumatoid arthritis, unspecified: Secondary | ICD-10-CM | POA: Diagnosis not present

## 2022-02-05 DIAGNOSIS — E8881 Metabolic syndrome: Secondary | ICD-10-CM | POA: Diagnosis not present

## 2022-02-05 DIAGNOSIS — L97512 Non-pressure chronic ulcer of other part of right foot with fat layer exposed: Secondary | ICD-10-CM | POA: Insufficient documentation

## 2022-02-05 NOTE — Progress Notes (Signed)
TALECIA, SHERLIN ([id number]) ?Visit Report for 02/05/2022 ?Arrival Information Details ?Patient Name: Date of Service: ?[name], RO [facility] 02/05/2022 2:45 PM ?Medical Record Number: [id number] ?Patient Account Number: [id number] ?Date of Birth/Sex: Treating RN: ?1956-08-24 (66 y.o. F) Deaton, Bobbi ?Primary Care Iveliz Garay: [name] Other Clinician: ?Referring Emelyn Roen: ?Treating Eulalie Speights/Extender: [name] ?[name] ?[name]: 2 ?Visit Information History Since Last Visit ?Added or deleted any medications: No ?Patient Arrived: Ambulatory ?Any new allergies or adverse reactions: No ?Arrival Time: 15:04 ?Had a fall or experienced change in No ?Accompanied By: self ?activities of daily living that may affect ?Transfer Assistance: None ?risk of falls: ?Patient Identification Verified: Yes ?Signs or symptoms of abuse/neglect since last visito No ?Secondary Verification Process Completed: Yes ?Hospitalized since last visit: No ?Patient Has Alerts: Yes ?Implantable device outside of the clinic excluding No ?Patient Alerts: ABI R 1.19 ?cellular tissue based products placed in the center ?ABI L 1.12 ?since last visit: ?TBI R 0.93 ?Has Dressing in Place as Prescribed: Yes ?TBI L 0.56 ?Has Footwear/Offloading in Place as Prescribed: Yes ?Left: Wedge Shoe ?Pain Present Now: No ?Electronic Signature(s) ?Signed: 02/05/2022 4:11:11 PM By: Deon Pilling RN, BSN ?Entered By: Deon Pilling on 02/05/2022 15:04:36 ?-------------------------------------------------------------------------------- ?Encounter Discharge Information Details ?Patient Name: Date of Service: ?[name], RO [facility] 02/05/2022 2:45 PM ?Medical Record Number: [id number] ?Patient Account Number: [id number] ?Date of Birth/Sex: Treating RN: ?02/02/1956 (66 y.o. F) Deaton, Bobbi ?Primary Care Ramal Eckhardt: [name] Other Clinician: ?Referring Debany Vantol: ?Treating Liahm Grivas/Extender: [name] ?[name] ?[name]: 2 ?Encounter  Discharge Information Items Post Procedure Vitals ?Discharge Condition: Stable ?Temperature (F): 97.9 ?Ambulatory Status: [name] ?Pulse (bpm): 65 ?Discharge Destination: Home ?Respiratory Rate (breaths/min): 18 ?Transportation: Private Auto ?Blood Pressure (mmHg): 156/72 ?Accompanied By: self ?Schedule Follow-up Appointment: Yes ?Clinical Summary of Care: ?Electronic Signature(s) ?Signed: 02/05/2022 4:11:11 PM By: Deon Pilling RN, BSN ?Entered By: Deon Pilling on 02/05/2022 15:43:21 ?-------------------------------------------------------------------------------- ?Lower Extremity Assessment Details ?Patient Name: ?Date of Service: ?[name], RO [facility] 02/05/2022 2:45 PM ?Medical Record Number: [id number] ?Patient Account Number: [id number] ?Date of Birth/Sex: ?Treating RN: ?1956-10-19 (66 y.o. F) Deaton, Bobbi ?Primary Care Manasi Dishon: [name] ?Other Clinician: ?Referring Roczen Waymire: ?Treating Khambrel Amsden/Extender: [name] ?[name] ?[name]: 2 ?Edema Assessment ?Assessed: [Left: No] [Right: Yes] ?Edema: [Left: Ye] [Right: s] ?Calf ?Left: Right: ?Point of Measurement: 31 cm From Medial Instep 46 cm ?Ankle ?Left: Right: ?Point of Measurement: 9 cm From Medial Instep 22 cm ?Vascular Assessment ?Pulses: ?Dorsalis Pedis ?Palpable: [Right:Yes] ?Electronic Signature(s) ?Signed: 02/05/2022 4:11:11 PM By: Deon Pilling RN, BSN ?Entered By: Deon Pilling on 02/05/2022 15:07:20 ?-------------------------------------------------------------------------------- ?Multi Wound Chart Details ?Patient Name: ?Date of Service: ?[name], RO [facility] 02/05/2022 2:45 PM ?Medical Record Number: [id number] ?Patient Account Number: [id number] ?Date of Birth/Sex: ?Treating RN: ?Apr 24, 1956 (66 y.o. F) ?Primary Care Ryot Burrous: [name] ?Other Clinician: ?Referring Jermar Colter: ?Treating Waylon Koffler/Extender: [name] ?[name] ?[name]: 2 ?Vital Signs ?Height(in): 65 ?Pulse(bpm): 65 ?Weight(lbs):  235 ?Blood Pressure(mmHg): 156/82 ?Body Mass Index(BMI): 39.1 ?Temperature(??F): 97.9 ?Respiratory Rate(breaths/min): 20 ?Photos: [1:Right Metatarsal head fifth] [N/A:N/A N/A] ?Wound Location: [1:Gradually Appeared] [N/A:N/A] ?Wounding Event: [1:Lymphedema] [N/A:N/A] ?Primary Etiology: [1:Lymphedema, Hypertension,] [N/A:N/A] ?Comorbid History: [1:Rheumatoid Arthritis, Neuropathy 12/26/2021] [N/A:N/A] ?Date Acquired: [1:2] [N/A:N/A] ?Weeks of Treatment: [1:Open] [N/A:N/A] ?Wound Status: [1:No] [N/A:N/A] ?Wound Recurrence: [1:0.2x0.5x0.1] [N/A:N/A] ?Measurements L x W x D (cm) [1:0.079] [N/A:N/A] ?A (cm?) : ?rea [1:0.008] [N/A:N/A] ?Volume (cm?) : [1:58.00%] [N/A:N/A] ?% Reduction in A [1:rea: 89.30%] [N/A:N/A] ?% Reduction  in Volume: [1:Full Thickness Without Exposed] [N/A:N/A] ?Classification: [1:Support Structures Medium] [N/A:N/A] ?Exudate A mount: [1:Serosanguineous] [N/A:N/A] ?Exudate Type: [1:red, brown] [N/A:N/A] ?Exudate Color: [1:Thickened] [N/A:N/A] ?Wound Margin: [1:Large (67-100%)] [N/A:N/A] ?Granulation A mount: [1:Pink] [N/A:N/A] ?Granulation Quality: [1:None Present (0%)] [N/A:N/A] ?Necrotic A mount: ?[1:Fat Layer (Subcutaneous Tissue): Yes N/A] ?Exposed Structures: ?[1:Fascia: No Tendon: No Muscle: No Joint: No Bone: No Large (67-100%)] [N/A:N/A] ?Epithelialization: [1:Debridement - Excisional] [N/A:N/A] ?Debridement: ?Pre-procedure Verification/Time Out 15:20 [N/A:N/A] ?Taken: [1:Lidocaine 5% topical ointment] [N/A:N/A] ?Pain Control: [1:Callus, Subcutaneous] [N/A:N/A] ?Tissue Debrided: [1:Skin/Subcutaneous Tissue] [N/A:N/A] ?Level: [1:4] [N/A:N/A] ?Debridement A (sq cm): [1:rea Curette] [N/A:N/A] ?Instrument: [1:Minimum] [N/A:N/A] ?Bleeding: [1:0] [N/A:N/A] ?Procedural Pain: [1:0] [N/A:N/A] ?Post Procedural Pain: [1:Procedure was tolerated well] [N/A:N/A] ?Debridement Treatment Response: [1:0.2x0.5x0.1] [N/A:N/A] ?Post Debridement Measurements L x ?W x D (cm) [1:0.008] [N/A:N/A] ?Post Debridement  Volume: (cm?) [1:thin layer of callous to periwound.] [N/A:N/A] ?Assessment Notes: [1:Debridement] [N/A:N/A] ?Treatment Notes ?Wound #1 (Metatarsal head fifth) Wound Laterality: Right ?Cleanser ?Soap and Water ?Discharge Instruction: May shower and wash wound with dial antibacterial soap and water prior to dressing change. ?Wound Cleanser ?Discharge Instruction: Cleanse the wound with wound cleanser prior to applying a clean dressing using gauze sponges, not tissue or cotton balls. ?Byram Ancillary Kit - 15 Day Supply ?Discharge Instruction: Use supplies as instructed; Kit contains: (15) Saline Bullets; (15) 3x3 Gauze; 15 pr Gloves ?Peri-Wound Care ?Topical ?Primary Dressing ?Promogran Prisma Matrix, 4.34 (sq in) (silver collagen) ?Discharge Instruction: Moisten collagen with saline or hydrogel ?Optifoam Non-Adhesive Dressing, 4x4 in ?Discharge Instruction: Apply to wound as a "donut" ?Secondary Dressing ?Woven Gauze Sponges 2x2 in ?Discharge Instruction: Apply over primary dressing as directed. ?Secured With ?Kerlix Roll Sterile, 4.5x3.1 (in/yd) ?Discharge Instruction: Secure with Kerlix as directed. ?42M Medipore H Soft Cloth Surgical T ape, 4 x 10 (in/yd) ?Discharge Instruction: Secure with tape as directed. ?Compression Wrap ?Compression Stockings ?Add-Ons ?Electronic Signature(s) ?Signed: 02/05/2022 3:47:41 PM By: [name] MD FACS ?Entered By: [name] on 02/05/2022 15:47:41 ?-------------------------------------------------------------------------------- ?Multi-Disciplinary Care Plan Details ?Patient Name: ?Date of Service: ?[name], RO [facility] 02/05/2022 2:45 PM ?Medical Record Number: [id number] ?Patient Account Number: [id number] ?Date of Birth/Sex: ?Treating RN: ?1956/03/09 (66 y.o. F) Deaton, Bobbi ?Primary Care Raneen Jaffer: [name] ?Other Clinician: ?Referring Amory Simonetti: ?Treating Chrystina Naff/Extender: [name] ?[name] ?[name]: 2 ?Active Inactive ?Wound/Skin  Impairment ?Nursing Diagnoses: ?Impaired tissue integrity ?Knowledge deficit related to ulceration/compromised skin integrity ?Goals: ?Patient/caregiver will verbalize understanding of skin care regimen ?Date Initiat

## 2022-02-05 NOTE — Progress Notes (Signed)
JENNESS, STEMLER ([id number]) ?Visit Report for 02/05/2022 ?Chief Complaint Document Details ?Patient Name: Date of Service: ?[name], RO [facility] 02/05/2022 2:45 PM ?Medical Record Number: [id number] ?Patient Account Number: [id number] ?Date of Birth/Sex: Treating RN: ?[birth year] (66 y.o. F) ?Primary Care Provider: [name] Other Clinician: ?Referring Provider: ?Treating Provider/Extender: [name] ?[name] ?[name]: 2 ?Information Obtained from: Patient ?Chief Complaint ?Patient seen for complaints of Non-Healing Wound. ?Electronic Signature(s) ?Signed: 02/05/2022 3:47:49 PM By: [name] MD FACS ?Entered By: [name] on 02/05/2022 15:47:49 ?-------------------------------------------------------------------------------- ?Debridement Details ?Patient Name: Date of Service: ?[name], RO [facility] 02/05/2022 2:45 PM ?Medical Record Number: [id number] ?Patient Account Number: [id number] ?Date of Birth/Sex: Treating RN: ?June 22, 1956 (66 y.o. F) Deaton, Bobbi ?Primary Care Provider: [name] Other Clinician: ?Referring Provider: ?Treating Provider/Extender: [name] ?[name] ?[name]: 2 ?Debridement Performed for Assessment: Wound #1 Right Metatarsal head fifth ?Performed By: Physician [name], MD ?Debridement Type: Debridement ?Level of Consciousness (Pre-procedure): Awake and Alert ?Pre-procedure Verification/Time Out Yes - 15:20 ?Taken: ?Start Time: 15:21 ?Pain Control: Lidocaine 5% topical ointment ?T Area Debrided (L x W): ?otal 2 (cm) x 2 (cm) = 4 (cm?) ?Tissue and other material debrided: ?Viable, Non-Viable, Callus, Subcutaneous, Skin: Dermis , Skin: Epidermis ?Level: Skin/Subcutaneous Tissue ?Debridement Description: Excisional ?Instrument: Curette ?Bleeding: Minimum ?End Time: 15:28 ?Procedural Pain: 0 ?Post Procedural Pain: 0 ?Response to Treatment: Procedure was tolerated well ?Level of Consciousness (Post- Awake and  Alert ?procedure): ?Post Debridement Measurements of Total Wound ?Length: (cm) 0.2 ?Width: (cm) 0.5 ?Depth: (cm) 0.1 ?Volume: (cm?) 0.008 ?Character of Wound/Ulcer Post Debridement: Improved ?Post Procedure Diagnosis ?Same as Pre-procedure ?Electronic Signature(s) ?Signed: 02/05/2022 4:11:11 PM By: Deon Pilling RN, BSN ?Signed: 02/05/2022 4:20:58 PM By: [name] MD FACS ?Entered By: Deon Pilling on 02/05/2022 15:30:42 ?-------------------------------------------------------------------------------- ?HPI Details ?Patient Name: Date of Service: ?[name], RO [facility] 02/05/2022 2:45 PM ?Medical Record Number: [id number] ?Patient Account Number: [id number] ?Date of Birth/Sex: Treating RN: ?[birth year] (66 y.o. F) ?Primary Care Provider: [name] Other Clinician: ?Referring Provider: ?Treating Provider/Extender: [name] ?[name] ?[name]: 2 ?History of Present Illness ?HPI Description: ADMISSION ?01/21/2022 ?This is a 66 year old woman presenting with an ulcer on the right fifth metatarsal head. She says that it has been present for about 6 weeks. She is not certain ?how it developed, but she does have peripheral neuropathy. She also has lymphedema, hypertension, metabolic syndrome/prediabetes, but does not carry an ?actual diagnosis of diabetes nor is she on any medications for this. 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 ABI in ?clinic today was 0.8. She has been treated in the past for lymphedema, but has been unable to tolerate compression. She does have lymphedema pumps but ?does not use these regularly. She had been applying Neosporin and new skin to her wound, but it did not really change much. She was referred here by her ?primary care provider for further evaluation and management. ?01/28/2022: 1 week follow-up. Apparently there was some miscommunication with home health and they did not come out to help her with her dressing  changes. ?The patient and her husband have been doing them themselves. The wound actually looks quite good, with decreased dimensions and a very nice healthy ?surface. ?02/05/2022: The home health nurse came out during the week and the patient was happy with the care. The wound is smaller today with a clean base. ?Electronic Signature(s) ?Signed: 02/05/2022 3:48:24 PM By: [name] MD  FACS ?Entered By: [name] on 02/05/2022 15:48:23 ?-------------------------------------------------------------------------------- ?Physical Exam Details ?Patient Name: Date of Service: ?[name], RO [facility] 02/05/2022 2:45 PM ?Medical Record Number: [id number] ?Patient Account Number: [id number] ?Date of Birth/Sex: Treating RN: ?[birth year] (66 y.o. F) ?Primary Care Provider: [name] Other Clinician: ?Referring Provider: ?Treating Provider/Extender: [name] ?[name] ?[name]: 2 ?Constitutional ?Slightly hypertensive. . . . No acute distress. ?Respiratory ?Normal work of breathing on room air. ?Notes ?02/05/2022: The wound on the plantar surface of her right fifth metatarsal head is smaller with some surrounding callus. The base is clean. ?Electronic Signature(s) ?Signed: 02/05/2022 3:57:55 PM By: [name] MD FACS ?Entered By: [name] on 02/05/2022 15:57:55 ?-------------------------------------------------------------------------------- ?Physician Orders Details ?Patient Name: ?Date of Service: ?[name], RO [facility] 02/05/2022 2:45 PM ?Medical Record Number: [id number] ?Patient Account Number: [id number] ?Date of Birth/Sex: ?Treating RN: ?1955/11/04 (66 y.o. F) Deaton, Bobbi ?Primary Care Provider: [name] ?Other Clinician: ?Referring Provider: ?Treating Provider/Extender: [name] ?[name] ?[name]: 2 ?Verbal / Phone Orders: No ?Diagnosis Coding ?ICD-10 Coding ?Code Description ?L97.512 Non-pressure chronic ulcer of other part of right foot with fat  layer exposed ?G60.9 Hereditary and idiopathic neuropathy, unspecified ?I89.0 Lymphedema, not elsewhere classified ?E66.01 Morbid (severe) obesity due to excess calories ?J03.15 Metabolic syndrome ?R73.03 Prediabetes ?Follow-up Appointments ?ppointment in 1 week. - Dr Celine Ahr Friday 02/12/2022 2pm ?Return A ?Dr. Celine Ahr following Friday 02/19/2022 230pm ?Bathing/ Shower/ Hygiene ?May shower with protection but do not get wound dressing(s) wet. - Please use cast protector (Can purchase cast protector from local ?pharmacy, such as [facility] or [facility], cost is about $18-$20) ?Edema Control - Lymphedema / SCD / Other ?Elevate legs to the level of the heart or above for 30 minutes daily and/or when sitting, a frequency of: ?Avoid standing for long periods of time. ?Patient to wear own compression stockings every day. ?Exercise regularly ?Off-Loading ?Other: - Offloading shoe on Right foot ?Home Health ?No change in wound care orders this week; continue Home Health for wound care. May utilize formulary equivalent dressing for wound ?treatment orders unless otherwise specified. - [facility] home health twice a week. once a week wound center. ?Wound Treatment ?Wound #1 - Metatarsal head fifth Wound Laterality: Right ?Cleanser: Soap and Water [facility]) Every Other Day/15 Days ?Discharge Instructions: May shower and wash wound with dial antibacterial soap and water prior to dressing change. ?Cleanser: Wound Cleanser (Home Health) (Generic) Every Other Day/15 Days ?Discharge Instructions: Cleanse the wound with wound cleanser prior to applying a clean dressing using gauze sponges, not tissue or cotton balls. ?Cleanser: Byram Ancillary Kit - 15 Day Supply (Generic) Every Other Day/15 Days ?Discharge Instructions: Use supplies as instructed; Kit contains: (15) Saline Bullets; (15) 3x3 Gauze; 15 pr Gloves ?Prim Dressing: Promogran Prisma Matrix, 4.34 (sq in) (silver collagen) (Home Health) (Generic) Every Other Day/15  Days ?ary ?Discharge Instructions: Moisten collagen with saline or hydrogel ?Prim Dressing: Optifoam Non-Adhesive Dressing, 4x4 in [facility]) (Generic) Every Other Day/15 Days ?ary ?Discharge Instructions: Apply to wound as a "donut" ?Second

## 2022-02-12 ENCOUNTER — Encounter (HOSPITAL_BASED_OUTPATIENT_CLINIC_OR_DEPARTMENT_OTHER): Payer: Medicare Other | Admitting: General Surgery

## 2022-02-12 DIAGNOSIS — L97512 Non-pressure chronic ulcer of other part of right foot with fat layer exposed: Secondary | ICD-10-CM | POA: Diagnosis not present

## 2022-02-12 NOTE — Progress Notes (Signed)
Evelyn Campbell, Evelyn Campbell ([id number]) ?Visit Report for 02/12/2022 ?Chief Complaint Document Details ?Patient Name: Date of Service: ?[name], RO [facility] 02/12/2022 3:15 PM ?Medical Record Number: [id number] ?Patient Account Number: [id number] ?Date of Birth/Sex: Treating RN: ?1956/04/12 (66 y.o. F) ?Primary Care Provider: [name] Other Clinician: ?Referring Provider: ?Treating Provider/Extender: [name] ?[name] ?[name]: 3 ?Information Obtained from: Patient ?Chief Complaint ?Patient seen for complaints of Non-Healing Wound. ?Electronic Signature(s) ?Signed: 02/12/2022 4:31:34 PM By: [name] MD FACS ?Entered By: [name] on 02/12/2022 16:31:34 ?-------------------------------------------------------------------------------- ?Debridement Details ?Patient Name: Date of Service: ?[name], RO [facility] 02/12/2022 3:15 PM ?Medical Record Number: [id number] ?Patient Account Number: [id number] ?Date of Birth/Sex: Treating RN: ?[birth year] (66 y.o. F) Evelyn Campbell, Evelyn Campbell ?Primary Care Provider: [name] Other Clinician: ?Referring Provider: ?Treating Provider/Extender: [name] ?[name] ?[name]: 3 ?Debridement Performed for Assessment: Wound #1 Right Metatarsal head fifth ?Performed By: Physician [name], MD ?Debridement Type: Debridement ?Level of Consciousness (Pre-procedure): Awake and Alert ?Pre-procedure Verification/Time Out Yes - 16:01 ?Taken: ?Start Time: 16:01 ?Pain Control: Lidocaine 4% T opical Solution ?T Area Debrided (L x W): ?otal 0.1 (cm) x 0.3 (cm) = 0.03 (cm?) ?Tissue and other material debrided: Non-Viable, Callus ?Level: Non-Viable Tissue ?Debridement Description: Selective/Open Wound ?Instrument: Curette ?Bleeding: Minimum ?Hemostasis Achieved: Pressure ?End Time: 16:03 ?Procedural Pain: 0 ?Post Procedural Pain: 0 ?Response to Treatment: Procedure was tolerated well ?Level of Consciousness (Post- Awake and  Alert ?procedure): ?Post Debridement Measurements of Total Wound ?Length: (cm) 0.1 ?Width: (cm) 0.3 ?Depth: (cm) 0.1 ?Volume: (cm?) 0.002 ?Character of Wound/Ulcer Post Debridement: Improved ?Post Procedure Diagnosis ?Same as Pre-procedure ?Electronic Signature(s) ?Signed: 02/12/2022 5:31:06 PM By: [name] MD FACS ?Signed: 02/12/2022 5:41:49 PM By: Dellie Catholic RN ?Entered By: Dellie Catholic on 02/12/2022 16:06:33 ?-------------------------------------------------------------------------------- ?HPI Details ?Patient Name: Date of Service: ?[name], RO [facility] 02/12/2022 3:15 PM ?Medical Record Number: [id number] ?Patient Account Number: [id number] ?Date of Birth/Sex: Treating RN: ?May 29, 1956 (66 y.o. F) ?Primary Care Provider: [name] Other Clinician: ?Referring Provider: ?Treating Provider/Extender: [name] ?[name] ?[name]: 3 ?History of Present Illness ?HPI Description: ADMISSION ?01/21/2022 ?This is a 66 year old woman presenting with an ulcer on the right fifth metatarsal head. She says that it has been present for about 6 weeks. She is not certain ?how it developed, but she does have peripheral neuropathy. She also has lymphedema, hypertension, metabolic syndrome/prediabetes, but does not carry an ?actual diagnosis of diabetes nor is she on any medications for this. 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 ABI in ?clinic today was 0.8. She has been treated in the past for lymphedema, but has been unable to tolerate compression. She does have lymphedema pumps but ?does not use these regularly. She had been applying Neosporin and new skin to her wound, but it did not really change much. She was referred here by her ?primary care provider for further evaluation and management. ?01/28/2022: 1 week follow-up. Apparently there was some miscommunication with home health and they did not come out to help her with her dressing  changes. ?The patient and her husband have been doing them themselves. The wound actually looks quite good, with decreased dimensions and a very nice healthy ?surface. ?02/05/2022: The home health nurse came out during the week and the patient was happy with the care. The wound is smaller today with a clean base. ?02/12/2022: The wound continues to contract and is quite small today. There is  a bit of callus buildup, but the wound surface itself is very clean. ?Electronic Signature(s) ?Signed: 02/12/2022 4:32:07 PM By: [name] MD FACS ?Entered By: [name] on 02/12/2022 16:32:07 ?-------------------------------------------------------------------------------- ?Physical Exam Details ?Patient Name: Date of Service: ?[name], RO [facility] 02/12/2022 3:15 PM ?Medical Record Number: [id number] ?Patient Account Number: [id number] ?Date of Birth/Sex: Treating RN: ?[birth year] (66 y.o. F) ?Primary Care Provider: [name] Other Clinician: ?Referring Provider: ?Treating Provider/Extender: [name] ?[name] ?[name]: 3 ?Constitutional ?. . . . No acute distress. ?Respiratory ?Normal work of breathing on room air. ?Notes ?02/12/2022: The wound continues to contract and is quite small today. There is just a bit of callus buildup. The base is clean. ?Electronic Signature(s) ?Signed: 02/12/2022 4:34:19 PM By: [name] MD FACS ?Entered By: [name] on 02/12/2022 16:34:19 ?-------------------------------------------------------------------------------- ?Physician Orders Details ?Patient Name: ?Date of Service: ?[name], RO [facility] 02/12/2022 3:15 PM ?Medical Record Number: [id number] ?Patient Account Number: [id number] ?Date of Birth/Sex: ?Treating RN: ?1956/04/04 (66 y.o. F) Evelyn Campbell, Evelyn Campbell ?Primary Care Provider: [name] ?Other Clinician: ?Referring Provider: ?Treating Provider/Extender: [name] ?[name] ?[name]: 3 ?Verbal / Phone Orders:  No ?Diagnosis Coding ?ICD-10 Coding ?Code Description ?L97.512 Non-pressure chronic ulcer of other part of right foot with fat layer exposed ?G60.9 Hereditary and idiopathic neuropathy, unspecified ?I89.0 Lymphedema, not elsewhere classified ?E66.01 Morbid (severe) obesity due to excess calories ?F41.42 Metabolic syndrome ?R73.03 Prediabetes ?Follow-up Appointments ?ppointment in 1 week. - ****Dr. Celine Ahr Friday 02/19/2022 3pm***** ?Return A ?Bathing/ Shower/ Hygiene ?May shower with protection but do not get wound dressing(s) wet. - Please use cast protector (Can purchase cast protector from local ?pharmacy, such as [facility] or [facility], cost is about $18-$20) ?Edema Control - Lymphedema / SCD / Other ?Elevate legs to the level of the heart or above for 30 minutes daily and/or when sitting, a frequency of: ?Avoid standing for long periods of time. ?Patient to wear own compression stockings every day. ?Exercise regularly ?Off-Loading ?Other: - Offloading shoe on Right foot ?Home Health ?No change in wound care orders this week; continue Home Health for wound care. May utilize formulary equivalent dressing for wound ?treatment orders unless otherwise specified. - [facility] home health twice a week. once a week wound center. ?Wound Treatment ?Wound #1 - Metatarsal head fifth Wound Laterality: Right ?Cleanser: Soap and Water [facility]) Every Other Day/15 Days ?Discharge Instructions: May shower and wash wound with dial antibacterial soap and water prior to dressing change. ?Cleanser: Wound Cleanser (Home Health) (Generic) Every Other Day/15 Days ?Discharge Instructions: Cleanse the wound with wound cleanser prior to applying a clean dressing using gauze sponges, not tissue or cotton balls. ?Cleanser: Byram Ancillary Kit - 15 Day Supply (Generic) Every Other Day/15 Days ?Discharge Instructions: Use supplies as instructed; Kit contains: (15) Saline Bullets; (15) 3x3 Gauze; 15 pr Gloves ?Prim Dressing: Promogran Prisma Matrix,  4.34 (sq in) (silver collagen) (Home Health) (Generic) Every Other Day/15 Days ?ary ?Discharge Instructions: Moisten collagen with saline or hydrogel ?Prim Dressing: Optifoam Non-Adhesive Dressing, 4x4 in [facility]) (Generic) Every O

## 2022-02-12 NOTE — Progress Notes (Signed)
NAILA, ELIZONDO ([id number]) ?Visit Report for 02/12/2022 ?Arrival Information Details ?Patient Name: Date of Service: ?[name], RO [facility] 02/12/2022 3:15 PM ?Medical Record Number: [id number] ?Patient Account Number: [id number] ?Date of Birth/Sex: Treating RN: ?06-Jun-1956 (66 y.o. F) [name], [name] ?Primary Care Keagon Glascoe: [name] Other Clinician: ?Referring Nasri Boakye: ?Treating Teri Diltz/Extender: [name] ?[name] ?[name]: 3 ?Visit Information History Since Last Visit ?Added or deleted any medications: No ?Patient Arrived: [name] ?Any new allergies or adverse reactions: No ?Arrival Time: 15:33 ?Had a fall or experienced change in No ?Accompanied By: family ?activities of daily living that may affect ?Transfer Assistance: None ?risk of falls: ?Patient Identification Verified: Yes ?Signs or symptoms of abuse/neglect since last visito No ?Patient Has Alerts: Yes ?Hospitalized since last visit: No ?Patient Alerts: ABI R 1.19 ?Implantable device outside of the clinic excluding No ?ABI L 1.12 ?cellular tissue based products placed in the center ?TBI R 0.93 ?since last visit: ?TBI L 0.56 ?Pain Present Now: No ?Electronic Signature(s) ?Signed: 02/12/2022 5:41:49 PM By: Dellie Catholic RN ?Entered By: Dellie Catholic on 02/12/2022 15:35:51 ?-------------------------------------------------------------------------------- ?Encounter Discharge Information Details ?Patient Name: Date of Service: ?[name], RO [facility] 02/12/2022 3:15 PM ?Medical Record Number: [id number] ?Patient Account Number: [id number] ?Date of Birth/Sex: Treating RN: ?[birth year] (66 y.o. F) [name], [name] ?Primary Care Kierria Feigenbaum: [name] Other Clinician: ?Referring Sameul Tagle: ?Treating Ammiel Guiney/Extender: [name] ?[name] ?[name]: 3 ?Encounter Discharge Information Items Post Procedure Vitals ?Discharge Condition: Stable ?Temperature (F): 97.7 ?Ambulatory Status: [name] ?Pulse (bpm): 60 ?Discharge  Destination: Home ?Respiratory Rate (breaths/min): 16 ?Transportation: Private Auto ?Blood Pressure (mmHg): 138/85 ?Accompanied By: self ?Schedule Follow-up Appointment: Yes ?Clinical Summary of Care: Patient Declined ?Electronic Signature(s) ?Signed: 02/12/2022 5:41:49 PM By: Dellie Catholic RN ?Entered By: Dellie Catholic on 02/12/2022 17:41:24 ?-------------------------------------------------------------------------------- ?Lower Extremity Assessment Details ?Patient Name: ?Date of Service: ?[name], RO [facility] 02/12/2022 3:15 PM ?Medical Record Number: [id number] ?Patient Account Number: [id number] ?Date of Birth/Sex: ?Treating RN: ?01-05-1956 (66 y.o. F) [name], [name] ?Primary Care Neri Vieyra: [name] ?Other Clinician: ?Referring Nohemi Nicklaus: ?Treating Lindbergh Winkles/Extender: [name] ?[name] ?[name]: 3 ?Edema Assessment ?Assessed: [Left: No] [Right: No] ?Edema: [Left: Ye] [Right: s] ?Calf ?Left: Right: ?Point of Measurement: 31 cm From Medial Instep 46 cm ?Ankle ?Left: Right: ?Point of Measurement: 9 cm From Medial Instep 22 cm ?Electronic Signature(s) ?Signed: 02/12/2022 5:41:49 PM By: Dellie Catholic RN ?Entered By: Dellie Catholic on 02/12/2022 15:47:34 ?-------------------------------------------------------------------------------- ?Multi Wound Chart Details ?Patient Name: ?Date of Service: ?[name], RO [facility] 02/12/2022 3:15 PM ?Medical Record Number: [id number] ?Patient Account Number: [id number] ?Date of Birth/Sex: ?Treating RN: ?Jan 26, 1956 (66 y.o. F) ?Primary Care Tasheem Elms: [name] ?Other Clinician: ?Referring Koran Seabrook: ?Treating Preslea Rhodus/Extender: [name] ?[name] ?[name]: 3 ?Vital Signs ?Height(in): 65 ?Pulse(bpm): 60 ?Weight(lbs): 235 ?Blood Pressure(mmHg): 138/85 ?Body Mass Index(BMI): 39.1 ?Temperature(??F): 97.7 ?Respiratory Rate(breaths/min): 16 ?Photos: [N/A:N/A] ?Right Metatarsal head fifth N/A N/A ?Wound Location: ?Gradually  Appeared N/A N/A ?Wounding Event: ?Lymphedema N/A N/A ?Primary Etiology: ?Lymphedema, Hypertension, N/A N/A ?Comorbid History: ?Rheumatoid Arthritis, Neuropathy ?12/26/2021 N/A N/A ?Date Acquired: ?3 N/A N/A ?Weeks of Treatment: ?Open N/A N/A ?Wound Status: ?No N/A N/A ?Wound Recurrence: ?0.1x0.3x0.1 N/A N/A ?Measurements L x W x D (cm) ?0.024 N/A N/A ?A (cm?) : ?rea ?0.002 N/A N/A ?Volume (cm?) : ?87.20% N/A N/A ?% Reduction in Area: ?97.30% N/A N/A ?% Reduction in Volume: ?Full Thickness Without Exposed N/A N/A ?Classification: ?Support Structures ?Medium N/A N/A ?Exudate A mount: ?Serosanguineous  N/A N/A ?Exudate Type: ?red, brown N/A N/A ?Exudate Color: ?Thickened N/A N/A ?Wound Margin: ?Large (67-100%) N/A N/A ?Granulation A mount: ?Pink N/A N/A ?Granulation Quality: ?None Present (0%) N/A N/A ?Necrotic A mount: ?Fat Layer (Subcutaneous Tissue): Yes N/A N/A ?Exposed Structures: ?Fascia: No ?Tendon: No ?Muscle: No ?Joint: No ?Bone: No ?Large (67-100%) N/A N/A ?Epithelialization: ?Debridement - Selective/Open Wound N/A N/A ?Debridement: ?Pre-procedure Verification/Time Out 16:01 N/A N/A ?Taken: ?Lidocaine 4% Topical Solution N/A N/A ?Pain Control: ?Callus N/A N/A ?Tissue Debrided: ?Non-Viable Tissue N/A N/A ?Level: ?0.03 N/A N/A ?Debridement A (sq cm): ?rea ?Curette N/A N/A ?Instrument: ?Minimum N/A N/A ?Bleeding: ?Pressure N/A N/A ?Hemostasis A chieved: ?0 N/A N/A ?Procedural Pain: ?0 N/A N/A ?Post Procedural Pain: ?Procedure was tolerated well N/A N/A ?Debridement Treatment Response: ?0.1x0.3x0.1 N/A N/A ?Post Debridement Measurements L x ?W x D (cm) ?0.002 N/A N/A ?Post Debridement Volume: (cm?) ?Debridement N/A N/A ?Procedures Performed: ?Treatment Notes ?Electronic Signature(s) ?Signed: 02/12/2022 4:30:02 PM By: [name] MD FACS ?Entered By: [name] on 02/12/2022 16:30:02 ?-------------------------------------------------------------------------------- ?Multi-Disciplinary Care Plan  Details ?Patient Name: ?Date of Service: ?[name], RO [facility] 02/12/2022 3:15 PM ?Medical Record Number: [id number] ?Patient Account Number: [id number] ?Date of Birth/Sex: ?Treating RN: ?[birth year] (66 y.o. F) [name], [name] ?Primary Care Momin Misko: [name] ?Other Clinician: ?Referring Jhene Westmoreland: ?Treating Koral Thaden/Extender: [name] ?[name] ?[name]: 3 ?Active Inactive ?Wound/Skin Impairment ?Nursing Diagnoses: ?Impaired tissue integrity ?Knowledge deficit related to ulceration/compromised skin integrity ?Goals: ?Patient/caregiver will verbalize understanding of skin care regimen ?Date Initiated: 01/21/2022 ?Target Resolution Date: 02/22/2022 ?Goal Status: Active ?Interventions: ?Assess patient/caregiver ability to obtain necessary supplies ?Assess patient/caregiver ability to perform ulcer/skin care regimen upon admission and as needed ?Assess ulceration(s) every visit ?Provide education on ulcer and skin care ?Treatment Activities: ?Referred to DME Mariateresa Batra for dressing supplies : 01/21/2022 ?Skin care regimen initiated : 01/21/2022 ?Notes: ?Electronic Signature(s) ?Signed: 02/12/2022 5:41:49 PM By: Dellie Catholic RN ?Entered By: Dellie Catholic on 02/12/2022 17:40:08 ?-------------------------------------------------------------------------------- ?Pain Assessment Details ?Patient Name: ?Date of Service: ?[name], RO [facility] 02/12/2022 3:15 PM ?Medical Record Number: [id number] ?Patient Account Number: [id number] ?Date of Birth/Sex: ?Treating RN: ?[birth year] (66 y.o. F) [name], [name] ?Primary Care Aniayah Alaniz: [name] ?Other Clinician: ?Referring Neesa Knapik: ?Treating Etsuko Dierolf/Extender: [name] ?[name] ?[name]: 3 ?Active Problems ?Location of Pain Severity and Description of Pain ?Patient Has Paino No ?Site Locations ?Pain Management and Medication ?Current Pain Management: ?Electronic Signature(s) ?Signed: 02/12/2022 5:41:49 PM By: Dellie Catholic  RN ?Entered By: Dellie Catholic on 02/12/2022 15:40:23 ?-------------------------------------------------------------------------------- ?Patient/Caregiver Education Details ?Patient Name: ?Date of Service: ?[name], RO [facility]

## 2022-02-19 ENCOUNTER — Encounter (HOSPITAL_BASED_OUTPATIENT_CLINIC_OR_DEPARTMENT_OTHER): Payer: Medicare Other | Admitting: General Surgery

## 2022-02-19 DIAGNOSIS — L97512 Non-pressure chronic ulcer of other part of right foot with fat layer exposed: Secondary | ICD-10-CM | POA: Diagnosis not present

## 2022-02-19 NOTE — Progress Notes (Signed)
Evelyn Campbell, Evelyn Campbell ([id number]) ?Visit Report for 02/19/2022 ?Chief Complaint Document Details ?Patient Name: Date of Service: ?[name], RO [facility] 02/19/2022 3:00 PM ?Medical Record Number: [id number] ?Patient Account Number: [id number] ?Date of Birth/Sex: Treating RN: ?04/14/1956 (66 y.o. F) ?Primary Care Provider: [name] Other Clinician: ?Referring Provider: ?Treating Provider/Extender: [name] ?[name] ?[name]: 4 ?Information Obtained from: Patient ?Chief Complaint ?Patient seen for complaints of Non-Healing Wound. ?Electronic Signature(s) ?Signed: 02/19/2022 4:03:30 PM By: [name] MD FACS ?Entered By: [name] on 02/19/2022 16:03:30 ?-------------------------------------------------------------------------------- ?Debridement Details ?Patient Name: Date of Service: ?[name], RO [facility] 02/19/2022 3:00 PM ?Medical Record Number: [id number] ?Patient Account Number: [id number] ?Date of Birth/Sex: Treating RN: ?May 18, 1956 (66 y.o. [name], [name] ?Primary Care Provider: [name] Other Clinician: ?Referring Provider: ?Treating Provider/Extender: [name] ?[name] ?[name]: 4 ?Debridement Performed for Assessment: Wound #1 Right Metatarsal head fifth ?Performed By: Physician [name], MD ?Debridement Type: Debridement ?Level of Consciousness (Pre-procedure): Awake and Alert ?Pre-procedure Verification/Time Out Yes - 15:38 ?Taken: ?Start Time: 15:38 ?T Area Debrided (L x W): ?otal 0.2 (cm) x 0.2 (cm) = 0.04 (cm?) ?Tissue and other material debrided: Non-Viable, Callus ?Level: Non-Viable Tissue ?Debridement Description: Selective/Open Wound ?Instrument: Curette ?Bleeding: Minimum ?Hemostasis Achieved: Pressure ?End Time: 15:40 ?Procedural Pain: 0 ?Post Procedural Pain: 0 ?Response to Treatment: Procedure was tolerated well ?Level of Consciousness (Post- Awake and Alert ?procedure): ?Post Debridement Measurements of Total  Wound ?Length: (cm) 0.2 ?Width: (cm) 0.2 ?Depth: (cm) 0.2 ?Volume: (cm?) 0.006 ?Character of Wound/Ulcer Post Debridement: Improved ?Post Procedure Diagnosis ?Same as Pre-procedure ?Electronic Signature(s) ?Signed: 02/19/2022 4:58:30 PM By: [name] MD FACS ?Signed: 02/19/2022 6:10:38 PM By: Levan Hurst RN, BSN ?Entered By: Levan Hurst on 02/19/2022 15:39:23 ?-------------------------------------------------------------------------------- ?HPI Details ?Patient Name: Date of Service: ?[name], RO [facility] 02/19/2022 3:00 PM ?Medical Record Number: [id number] ?Patient Account Number: [id number] ?Date of Birth/Sex: Treating RN: ?[birth year] (66 y.o. F) ?Primary Care Provider: [name] Other Clinician: ?Referring Provider: ?Treating Provider/Extender: [name] ?[name] ?[name]: 4 ?History of Present Illness ?HPI Description: ADMISSION ?01/21/2022 ?This is a 66 year old woman presenting with an ulcer on the right fifth metatarsal head. She says that it has been present for about 6 weeks. She is not certain ?how it developed, but she does have peripheral neuropathy. She also has lymphedema, hypertension, metabolic syndrome/prediabetes, but does not carry an ?actual diagnosis of diabetes nor is she on any medications for this. 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 ABI in ?clinic today was 0.8. She has been treated in the past for lymphedema, but has been unable to tolerate compression. She does have lymphedema pumps but ?does not use these regularly. She had been applying Neosporin and new skin to her wound, but it did not really change much. She was referred here by her ?primary care provider for further evaluation and management. ?01/28/2022: 1 week follow-up. Apparently there was some miscommunication with home health and they did not come out to help her with her dressing changes. ?The patient and her husband have been doing them  themselves. The wound actually looks quite good, with decreased dimensions and a very nice healthy ?surface. ?02/05/2022: The home health nurse came out during the week and the patient was happy with the care. The wound is smaller today with a clean base. ?02/12/2022: The wound continues to contract and is quite small today. There is a bit of callus buildup, but  the wound surface itself is very clean. ?02/19/2022: The wound is nearly closed. There is a small amount of callus buildup. No slough or drainage. ?Electronic Signature(s) ?Signed: 02/19/2022 4:04:00 PM By: [name] MD FACS ?Entered By: [name] on 02/19/2022 16:03:59 ?-------------------------------------------------------------------------------- ?Physical Exam Details ?Patient Name: Date of Service: ?[name], RO [facility] 02/19/2022 3:00 PM ?Medical Record Number: [id number] ?Patient Account Number: [id number] ?Date of Birth/Sex: Treating RN: ?Dec 18, 1955 (66 y.o. F) ?Primary Care Provider: [name] Other Clinician: ?Referring Provider: ?Treating Provider/Extender: [name] ?[name] ?[name]: 4 ?Constitutional ?. . . . No acute distress. ?Respiratory ?Normal work of breathing on room air. ?Notes ?02/19/2022: The wound is nearly closed. There is some callus buildup but the wound surface is clean. ?Electronic Signature(s) ?Signed: 02/19/2022 4:05:11 PM By: [name] MD FACS ?Entered By: [name] on 02/19/2022 16:05:11 ?-------------------------------------------------------------------------------- ?Physician Orders Details ?Patient Name: ?Date of Service: ?[name], RO [facility] 02/19/2022 3:00 PM ?Medical Record Number: [id number] ?Patient Account Number: [id number] ?Date of Birth/Sex: ?Treating RN: ?01-Jul-1956 (66 y.o. [name], [name] ?Primary Care Provider: [name] ?Other Clinician: ?Referring Provider: ?Treating Provider/Extender: [name] ?[name] ?[name]: 4 ?Verbal /  Phone Orders: No ?Diagnosis Coding ?ICD-10 Coding ?Code Description ?L97.512 Non-pressure chronic ulcer of other part of right foot with fat layer exposed ?G60.9 Hereditary and idiopathic neuropathy, unspecified ?I89.0 Lymphedema, not elsewhere classified ?E66.01 Morbid (severe) obesity due to excess calories ?W92.95 Metabolic syndrome ?R73.03 Prediabetes ?Follow-up Appointments ?ppointment in 1 week. - Dr. Celine Ahr - Thursday 4/27 at 2:30 - Room 3 ?Return A ?Bathing/ Shower/ Hygiene ?May shower with protection but do not get wound dressing(s) wet. - Please use cast protector (Can purchase cast protector from local ?pharmacy, such as [facility] or [facility], cost is about $18-$20) ?Edema Control - Lymphedema / SCD / Other ?Elevate legs to the level of the heart or above for 30 minutes daily and/or when sitting, a frequency of: ?Avoid standing for long periods of time. ?Patient to wear own compression stockings every day. ?Exercise regularly ?Off-Loading ?Other: - Offloading shoe on Right foot ?Home Health ?No change in wound care orders this week; continue Home Health for wound care. May utilize formulary equivalent dressing for wound ?treatment orders unless otherwise specified. ?Other Home Health Orders/Instructions: - [facility] twice a week ?Wound Treatment ?Wound #1 - Metatarsal head fifth Wound Laterality: Right ?Cleanser: Soap and Water [facility]) Every Other Day/15 Days ?Discharge Instructions: May shower and wash wound with dial antibacterial soap and water prior to dressing change. ?Cleanser: Wound Cleanser (Home Health) (Generic) Every Other Day/15 Days ?Discharge Instructions: Cleanse the wound with wound cleanser prior to applying a clean dressing using gauze sponges, not tissue or cotton balls. ?Cleanser: [employer] Ancillary Kit - 15 Day Supply (Generic) Every Other Day/15 Days ?Discharge Instructions: Use supplies as instructed; Kit contains: (15) Saline Bullets; (15) 3x3 Gauze; 15 pr Gloves ?Prim Dressing:  Promogran Prisma Matrix, 4.34 (sq in) (silver collagen) (Home Health) (Generic) Every Other Day/15 Days ?ary ?Discharge Instructions: Moisten collagen with saline or hydrogel ?Secondary Dressing: Optifoam Non-Adhesive Dress

## 2022-02-22 NOTE — Progress Notes (Signed)
Evelyn Campbell, FORT ([id number]) ?Visit Report for 02/19/2022 ?Arrival Information Details ?Patient Name: Date of Service: ?[name], RO [facility] 02/19/2022 3:00 PM ?Medical Record Number: [id number] ?Patient Account Number: [id number] ?Date of Birth/Sex: Treating RN: ?09/12/1956 (66 y.o. [name], [name] ?Primary Care Yaire Kreher: [name] Other Clinician: ?Referring Vrishank Moster: ?Treating Manjot Hinks/Extender: [name] ?[name] ?[name]: 4 ?Visit Information History Since Last Visit ?Added or deleted any medications: No ?Patient Arrived: Evelyn Campbell ?Any new allergies or adverse reactions: No ?Arrival Time: 15:18 ?Had a fall or experienced change in No ?Accompanied By: alone ?activities of daily living that may affect ?Transfer Assistance: None ?risk of falls: ?Patient Identification Verified: Yes ?Signs or symptoms of abuse/neglect since last visito No ?Secondary Verification Process Completed: Yes ?Hospitalized since last visit: No ?Patient Has Alerts: Yes ?Implantable device outside of the clinic excluding No ?Patient Alerts: ABI R 1.19 ?cellular tissue based products placed in the center ?ABI L 1.12 ?since last visit: ?TBI R 0.93 ?Has Dressing in Place as Prescribed: Yes ?TBI L 0.56 ?Has Footwear/Offloading in Place as Prescribed: Yes ?Right: Wedge Shoe ?Pain Present Now: No ?Electronic Signature(s) ?Signed: 02/19/2022 6:10:38 PM By: Levan Hurst RN, BSN ?Entered By: Levan Hurst on 02/19/2022 15:22:15 ?-------------------------------------------------------------------------------- ?Encounter Discharge Information Details ?Patient Name: Date of Service: ?[name], RO [facility] 02/19/2022 3:00 PM ?Medical Record Number: [id number] ?Patient Account Number: [id number] ?Date of Birth/Sex: Treating RN: ?1956/06/19 (66 y.o. [name], [name] ?Primary Care [name]: [name] Other Clinician: ?Referring Codey Burling: ?Treating Yehonatan Grandison/Extender: [name] ?[name] ?[name]:  4 ?Encounter Discharge Information Items Post Procedure Vitals ?Discharge Condition: Stable ?Temperature (F): 97.9 ?Ambulatory Status: Evelyn Campbell ?Pulse (bpm): 80 ?Discharge Destination: Home ?Respiratory Rate (breaths/min): 18 ?Transportation: Private Auto ?Blood Pressure (mmHg): 138/87 ?Accompanied By: alone ?Schedule Follow-up Appointment: Yes ?Clinical Summary of Care: Patient Declined ?Electronic Signature(s) ?Signed: 02/19/2022 6:10:38 PM By: Levan Hurst RN, BSN ?Entered By: Levan Hurst on 02/19/2022 18:01:45 ?-------------------------------------------------------------------------------- ?Lower Extremity Assessment Details ?Patient Name: ?Date of Service: ?[name], RO [facility] 02/19/2022 3:00 PM ?Medical Record Number: [id number] ?Patient Account Number: [id number] ?Date of Birth/Sex: ?Treating RN: ?Jun 15, 1956 (66 y.o. [name], [name] ?Primary Care Tatelyn Vanhecke: [name] ?Other Clinician: ?Referring Shaylene Paganelli: ?Treating Haytham Maher/Extender: [name] ?[name] ?[name]: 4 ?Edema Assessment ?Assessed: [Left: No] [Right: No] ?Edema: [Left: Ye] [Right: s] ?Calf ?Left: Right: ?Point of Measurement: 31 cm From Medial Instep 46 cm ?Ankle ?Left: Right: ?Point of Measurement: 9 cm From Medial Instep 22 cm ?Vascular Assessment ?Pulses: ?Dorsalis Pedis ?Palpable: [Right:Yes] ?Electronic Signature(s) ?Signed: 02/19/2022 6:10:38 PM By: Levan Hurst RN, BSN ?Entered By: Levan Hurst on 02/19/2022 15:28:04 ?-------------------------------------------------------------------------------- ?Multi Wound Chart Details ?Patient Name: ?Date of Service: ?[name], RO [facility] 02/19/2022 3:00 PM ?Medical Record Number: [id number] ?Patient Account Number: [id number] ?Date of Birth/Sex: ?Treating RN: ?[birth year] (66 y.o. F) ?Primary Care Hoyt Leanos: [name] ?Other Clinician: ?Referring Boleslaw Borghi: ?Treating Erman Thum/Extender: [name] ?[name] ?[name]: 4 ?Vital Signs ?Height(in):  65 ?Pulse(bpm): 80 ?Weight(lbs): 235 ?Blood Pressure(mmHg): 138/87 ?Body Mass Index(BMI): 39.1 ?Temperature(??F): 97.9 ?Respiratory Rate(breaths/min): 18 ?Photos: [1:Right Metatarsal head fifth] [N/A:N/A N/A] ?Wound Location: [1:Gradually Appeared] [N/A:N/A] ?Wounding Event: [1:Lymphedema] [N/A:N/A] ?Primary Etiology: [1:Lymphedema, Hypertension,] [N/A:N/A] ?Comorbid History: [1:Rheumatoid Arthritis, Neuropathy 12/26/2021] [N/A:N/A] ?Date Acquired: [1:4] [N/A:N/A] ?Weeks of Treatment: [1:Open] [N/A:N/A] ?Wound Status: [1:No] [N/A:N/A] ?Wound Recurrence: [1:0.2x0.2x0.2] [N/A:N/A] ?Measurements L x W x D (cm) [1:0.031] [N/A:N/A] ?A (cm?) : ?rea [1:0.006] [N/A:N/A] ?Volume (cm?) : [1:83.50%] [N/A:N/A] ?% Reduction in A [1:rea: 92.00%] [N/A:N/A] ?%  Reduction in Volume: [1:12] ?Starting Position 1 (o'clock): [1:12] ?Ending Position 1 (o'clock): [1:0.2] ?Maximum Distance 1 (cm): [1:Yes] [N/A:N/A] ?Undermining: [1:Full Thickness Without Exposed] [N/A:N/A] ?Classification: [1:Support Structures Medium] [N/A:N/A] ?Exudate A mount: [1:Serosanguineous] [N/A:N/A] ?Exudate Type: [1:red, brown] [N/A:N/A] ?Exudate Color: [1:Thickened] [N/A:N/A] ?Wound Margin: [1:Large (67-100%)] [N/A:N/A] ?Granulation A mount: [1:Red, Pink] [N/A:N/A] ?Granulation Quality: [1:None Present (0%)] [N/A:N/A] ?Necrotic A mount: ?[1:Fat Layer (Subcutaneous Tissue): Yes N/A] ?Exposed Structures: ?[1:Fascia: No Tendon: No Muscle: No Joint: No Bone: No Large (67-100%)] [N/A:N/A] ?Epithelialization: [1:Debridement - Selective/Open Wound N/A] ?Debridement: ?Pre-procedure Verification/Time Out 15:38 [N/A:N/A] ?Taken: [1:Callus] [N/A:N/A] ?Tissue Debrided: [1:Non-Viable Tissue] [N/A:N/A] ?Level: [1:0.04] [N/A:N/A] ?Debridement A (sq cm): [1:rea Curette] [N/A:N/A] ?Instrument: [1:Minimum] [N/A:N/A] ?Bleeding: [1:Pressure] [N/A:N/A] ?Hemostasis A chieved: [1:0] [N/A:N/A] ?Procedural Pain: [1:0] [N/A:N/A] ?Post Procedural Pain: [1:Procedure was tolerated well]  [N/A:N/A] ?Debridement Treatment Response: [1:0.2x0.2x0.2] [N/A:N/A] ?Post Debridement Measurements L x ?W x D (cm) [1:0.006] [N/A:N/A] ?Post Debridement Volume: (cm?) [1:Debridement] [N/A:N/A] ?Treatment Notes ?Electronic Signature(s) ?Signed: 02/19/2022 4:03:21 PM By: [name] MD FACS ?Entered By: [name] on 02/19/2022 16:03:20 ?-------------------------------------------------------------------------------- ?Multi-Disciplinary Care Plan Details ?Patient Name: ?Date of Service: ?[name], RO [facility] 02/19/2022 3:00 PM ?Medical Record Number: [id number] ?Patient Account Number: [id number] ?Date of Birth/Sex: ?Treating RN: ?10/27/1956 (66 y.o. [name], [name] ?Primary Care Amarii Amy: [name] ?Other Clinician: ?Referring Donyae Kohn: ?Treating Orly Quimby/Extender: [name] ?[name] ?[name]: 4 ?Active Inactive ?Wound/Skin Impairment ?Nursing Diagnoses: ?Impaired tissue integrity ?Knowledge deficit related to ulceration/compromised skin integrity ?Goals: ?Patient/caregiver will verbalize understanding of skin care regimen ?Date Initiated: 01/21/2022 ?Target Resolution Date: 02/22/2022 ?Goal Status: Active ?Interventions: ?Assess patient/caregiver ability to obtain necessary supplies ?Assess patient/caregiver ability to perform ulcer/skin care regimen upon admission and as needed ?Assess ulceration(s) every visit ?Provide education on ulcer and skin care ?Treatment Activities: ?Referred to DME Tilden Broz for dressing supplies : 01/21/2022 ?Skin care regimen initiated : 01/21/2022 ?Notes: ?Electronic Signature(s) ?Signed: 02/19/2022 6:10:38 PM By: Levan Hurst RN, BSN ?Entered By: Levan Hurst on 02/19/2022 15:31:15 ?-------------------------------------------------------------------------------- ?Pain Assessment Details ?Patient Name: ?Date of Service: ?[name], RO [facility] 02/19/2022 3:00 PM ?Medical Record Number: [id number] ?Patient Account Number: [id number] ?Date of  Birth/Sex: ?Treating RN: ?Jun 01, 1956 (66 y.o. [name], [name] ?Primary Care Tristin Gladman: [name] ?Other Clinician: ?Referring Azariyah Luhrs: ?Treating Ashford Clouse/Extender: [name] ?[name] ?[name]

## 2022-02-25 ENCOUNTER — Encounter (HOSPITAL_BASED_OUTPATIENT_CLINIC_OR_DEPARTMENT_OTHER): Payer: Medicare Other | Admitting: General Surgery

## 2022-02-25 DIAGNOSIS — L97512 Non-pressure chronic ulcer of other part of right foot with fat layer exposed: Secondary | ICD-10-CM | POA: Diagnosis not present

## 2022-03-01 NOTE — Progress Notes (Signed)
Evelyn Campbell, Evelyn Campbell ([id number]) ?Visit Report for 02/25/2022 ?Arrival Information Details ?Patient Name: Date of Service: ?Evelyn Campbell, Evelyn Campbell [facility] 02/25/2022 2:30 PM ?Medical Record Number: [id number] ?Patient Account Number: [id number] ?Date of Birth/Sex: Treating RN: ?12-Nov-1955 (66 y.o. [name], [name] ?Primary Care Charish Schroepfer: [name] Other Clinician: ?Referring Britani Beattie: ?Treating Jarrod Mcenery/Extender: [name] ?[name] ?[name]: 5 ?Visit Information History Since Last Visit ?Added or deleted any medications: No ?Patient Arrived: Ambulatory ?Any new allergies or adverse reactions: No ?Arrival Time: 14:55 ?Had a fall or experienced change in No ?Accompanied By: self ?activities of daily living that may affect ?Transfer Assistance: None ?risk of falls: ?Patient Identification Verified: Yes ?Signs or symptoms of abuse/neglect since last visito No ?Secondary Verification Process Completed: Yes ?Hospitalized since last visit: No ?Patient Has Alerts: Yes ?Implantable device outside of the clinic excluding No ?Patient Alerts: ABI R 1.19 ?cellular tissue based products placed in the center ?ABI L 1.12 ?since last visit: ?TBI R 0.93 ?Has Dressing in Place as Prescribed: Yes ?TBI L 0.56 ?Pain Present Now: Yes ?Electronic Signature(s) ?Signed: 02/26/2022 9:12:08 AM By: Sandre Kitty ?Entered By: Sandre Kitty on 02/25/2022 14:55:44 ?-------------------------------------------------------------------------------- ?Encounter Discharge Information Details ?Patient Name: Date of Service: ?Evelyn Campbell, Evelyn Campbell [facility] 02/25/2022 2:30 PM ?Medical Record Number: [id number] ?Patient Account Number: [id number] ?Date of Birth/Sex: Treating RN: ?1956-05-28 (66 y.o. F) [name], [name] ?Primary Care Autumm Hattery: [name] Other Clinician: ?Referring Jailin Manocchio: ?Treating Zeya Balles/Extender: [name] ?[name] ?[name]: 5 ?Encounter Discharge Information Items Post Procedure Vitals ?Discharge  Condition: Stable ?Temperature (F): 98.1 ?Ambulatory Status: [name] ?Pulse (bpm): 56 ?Discharge Destination: Home ?Respiratory Rate (breaths/min): 18 ?Transportation: Private Auto ?Blood Pressure (mmHg): 140/81 ?Accompanied By: self ?Schedule Follow-up Appointment: Yes ?Clinical Summary of Care: Patient Declined ?Electronic Signature(s) ?Signed: 02/25/2022 5:37:51 PM By: Dellie Catholic RN ?Entered By: Dellie Catholic on 02/25/2022 17:37:20 ?-------------------------------------------------------------------------------- ?Lower Extremity Assessment Details ?Patient Name: ?Date of Service: ?Evelyn Campbell, Evelyn Campbell [facility] 02/25/2022 2:30 PM ?Medical Record Number: [id number] ?Patient Account Number: [id number] ?Date of Birth/Sex: ?Treating RN: ?03/12/1956 (66 y.o. F) [name], [name] ?Primary Care Vitoria Conyer: [name] ?Other Clinician: ?Referring Rhyleigh Grassel: ?Treating Rogelio Winbush/Extender: [name] ?[name] ?[name]: 5 ?Edema Assessment ?Assessed: [Left: No] [Right: No] ?Edema: [Left: Ye] [Right: s] ?Calf ?Left: Right: ?Point of Measurement: 31 cm From Medial Instep 46 cm ?Ankle ?Left: Right: ?Point of Measurement: 9 cm From Medial Instep 22.2 cm ?Electronic Signature(s) ?Signed: 02/25/2022 5:37:51 PM By: Dellie Catholic RN ?Entered By: Dellie Catholic on 02/25/2022 15:08:52 ?-------------------------------------------------------------------------------- ?Multi Wound Chart Details ?Patient Name: ?Date of Service: ?Evelyn Campbell, Evelyn Campbell [facility] 02/25/2022 2:30 PM ?Medical Record Number: [id number] ?Patient Account Number: [id number] ?Date of Birth/Sex: ?Treating RN: ?[birth year] (66 y.o. [name], [name] ?Primary Care Aiyanna Awtrey: [name] ?Other Clinician: ?Referring Darnell Stimson: ?Treating Adryana Mogensen/Extender: [name] ?[name] ?[name]: 5 ?Vital Signs ?Height(in): 65 ?Pulse(bpm): 56 ?Weight(lbs): 235 ?Blood Pressure(mmHg): 140/81 ?Body Mass Index(BMI): 39.1 ?Temperature(??F): 98.1 ?Respiratory  Rate(breaths/min): 18 ?Photos: [N/A:N/A] ?Right Metatarsal head fifth N/A N/A ?Wound Location: ?Gradually Appeared N/A N/A ?Wounding Event: ?Lymphedema N/A N/A ?Primary Etiology: ?Lymphedema, Hypertension, N/A N/A ?Comorbid History: ?Rheumatoid Arthritis, Neuropathy ?12/26/2021 N/A N/A ?Date Acquired: ?5 N/A N/A ?Weeks of Treatment: ?Open N/A N/A ?Wound Status: ?No N/A N/A ?Wound Recurrence: ?0.2x0.2x0.2 N/A N/A ?Measurements L x W x D (cm) ?0.031 N/A N/A ?A (cm?) : ?rea ?0.006 N/A N/A ?Volume (cm?) : ?83.50% N/A N/A ?% Reduction in Area: ?92.00% N/A N/A ?% Reduction in Volume: ?Full Thickness Without  Exposed N/A N/A ?Classification: ?Support Structures ?Medium N/A N/A ?Exudate A mount: ?Serosanguineous N/A N/A ?Exudate Type: ?red, brown N/A N/A ?Exudate Color: ?Thickened N/A N/A ?Wound Margin: ?Small (1-33%) N/A N/A ?Granulation A mount: ?Red N/A N/A ?Granulation Quality: ?Large (67-100%) N/A N/A ?Necrotic A mount: ?Eschar N/A N/A ?Necrotic Tissue: ?Fat Layer (Subcutaneous Tissue): Yes N/A N/A ?Exposed Structures: ?Fascia: No ?Tendon: No ?Muscle: No ?Joint: No ?Bone: No ?Large (67-100%) N/A N/A ?Epithelialization: ?Debridement - Selective/Open Wound N/A N/A ?Debridement: ?Pre-procedure Verification/Time Out 15:16 N/A N/A ?Taken: ?Lidocaine 4% Topical Solution N/A N/A ?Pain Control: ?Necrotic/Eschar, Callus N/A N/A ?Tissue Debrided: ?Non-Viable Tissue N/A N/A ?Level: ?0.04 N/A N/A ?Debridement A (sq cm): ?rea ?Curette N/A N/A ?Instrument: ?Minimum N/A N/A ?Bleeding: ?Pressure N/A N/A ?Hemostasis A chieved: ?0 N/A N/A ?Procedural Pain: ?0 N/A N/A ?Post Procedural Pain: ?Procedure was tolerated well N/A N/A ?Debridement Treatment Response: ?0.2x0.2x0.2 N/A N/A ?Post Debridement Measurements L x ?W x D (cm) ?0.006 N/A N/A ?Post Debridement Volume: (cm?) ?Debridement N/A N/A ?Procedures Performed: ?Treatment Notes ?Electronic Signature(s) ?Signed: 02/25/2022 3:32:18 PM By: [name] MD FACS ?Signed: 03/01/2022  5:50:11 PM By: Levan Hurst RN, BSN ?Entered By: [name] on 02/25/2022 15:32:17 ?-------------------------------------------------------------------------------- ?Multi-Disciplinary Care Plan Details ?Patient Name: ?Date of Service: ?Evelyn Campbell, Evelyn Campbell [facility] 02/25/2022 2:30 PM ?Medical Record Number: [id number] ?Patient Account Number: [id number] ?Date of Birth/Sex: ?Treating RN: ?1956-05-06 (66 y.o. F) [name], [name] ?Primary Care Apollo Timothy: [name] ?Other Clinician: ?Referring Keaghan Staton: ?Treating Kailana Benninger/Extender: [name] ?[name] ?[name]: 5 ?Active Inactive ?Wound/Skin Impairment ?Nursing Diagnoses: ?Impaired tissue integrity ?Knowledge deficit related to ulceration/compromised skin integrity ?Goals: ?Patient/caregiver will verbalize understanding of skin care regimen ?Date Initiated: 01/21/2022 ?Target Resolution Date: 03/26/2022 ?Goal Status: Active ?Interventions: ?Assess patient/caregiver ability to obtain necessary supplies ?Assess patient/caregiver ability to perform ulcer/skin care regimen upon admission and as needed ?Assess ulceration(s) every visit ?Provide education on ulcer and skin care ?Treatment Activities: ?Referred to DME Shayla Heming for dressing supplies : 01/21/2022 ?Skin care regimen initiated : 01/21/2022 ?Notes: ?Electronic Signature(s) ?Signed: 02/25/2022 5:37:51 PM By: Dellie Catholic RN ?Entered By: Dellie Catholic on 02/25/2022 17:36:08 ?-------------------------------------------------------------------------------- ?Pain Assessment Details ?Patient Name: ?Date of Service: ?Evelyn Campbell, Evelyn Campbell [facility] 02/25/2022 2:30 PM ?Medical Record Number: [id number] ?Patient Account Number: [id number] ?Date of Birth/Sex: ?Treating RN: ?[birth year] (66 y.o. [name], [name] ?Primary Care Lekeith Wulf: [name] ?Other Clinician: ?Referring Lyrica Mcclarty: ?Treating Dannah Ryles/Extender: [name] ?[name] ?[name]: 5 ?Active Problems ?Location of Pain  Severity and Description of Pain ?Patient Has Paino Yes ?Site Locations ?Rate the pain. ?Current Pain Level: 5 ?Pain Management and Medication ?Current Pain Management: ?Electronic Signature(s) ?Signed: 02/26/2022 9:12

## 2022-03-01 NOTE — Progress Notes (Signed)
Evelyn Campbell, Evelyn Campbell ([id number]) ?Visit Report for 02/25/2022 ?Chief Complaint Document Details ?Patient Name: Date of Service: ?[name], RO [facility] 02/25/2022 2:30 PM ?Medical Record Number: [id number] ?Patient Account Number: [id number] ?Date of Birth/Sex: Treating RN: ?01-02-1956 (66 y.o. Evelyn Campbell, Evelyn Campbell ?Primary Care Provider: [name] Other Clinician: ?Referring Provider: ?Treating Provider/Extender: [name] ?[name] ?[name]: 5 ?Information Obtained from: Patient ?Chief Complaint ?Patient seen for complaints of Non-Healing Wound. ?Electronic Signature(s) ?Signed: 02/25/2022 3:32:24 PM By: [name] MD FACS ?Entered By: [name] on 02/25/2022 15:32:24 ?-------------------------------------------------------------------------------- ?Debridement Details ?Patient Name: Date of Service: ?[name], RO [facility] 02/25/2022 2:30 PM ?Medical Record Number: [id number] ?Patient Account Number: [id number] ?Date of Birth/Sex: Treating RN: ?Oct 01, 1956 (66 y.o. F) Evelyn Campbell, Evelyn Campbell ?Primary Care Provider: [name] Other Clinician: ?Referring Provider: ?Treating Provider/Extender: [name] ?[name] ?[name]: 5 ?Debridement Performed for Assessment: Wound #1 Right Metatarsal head fifth ?Performed By: Physician [name], MD ?Debridement Type: Debridement ?Level of Consciousness (Pre-procedure): Awake and Alert ?Pre-procedure Verification/Time Out Yes - 15:16 ?Taken: ?Start Time: 15:16 ?Pain Control: Lidocaine 4% T opical Solution ?T Area Debrided (L x W): ?otal 0.2 (cm) x 0.2 (cm) = 0.04 (cm?) ?Tissue and other material debrided: Non-Viable, Callus, Eschar ?Level: Non-Viable Tissue ?Debridement Description: Selective/Open Wound ?Instrument: Curette ?Bleeding: Minimum ?Hemostasis Achieved: Pressure ?End Time: 15:17 ?Procedural Pain: 0 ?Post Procedural Pain: 0 ?Response to Treatment: Procedure was tolerated well ?Level of Consciousness (Post- Awake and  Alert ?procedure): ?Post Debridement Measurements of Total Wound ?Length: (cm) 0.2 ?Width: (cm) 0.2 ?Depth: (cm) 0.2 ?Volume: (cm?) 0.006 ?Character of Wound/Ulcer Post Debridement: Improved ?Post Procedure Diagnosis ?Same as Pre-procedure ?Electronic Signature(s) ?Signed: 02/25/2022 4:56:31 PM By: [name] MD FACS ?Signed: 02/25/2022 5:37:51 PM By: Dellie Catholic RN ?Entered By: Dellie Catholic on 02/25/2022 15:20:55 ?-------------------------------------------------------------------------------- ?HPI Details ?Patient Name: Date of Service: ?[name], RO [facility] 02/25/2022 2:30 PM ?Medical Record Number: [id number] ?Patient Account Number: [id number] ?Date of Birth/Sex: Treating RN: ?04/21/1956 (66 y.o. Evelyn Campbell, Evelyn Campbell ?Primary Care Provider: [name] Other Clinician: ?Referring Provider: ?Treating Provider/Extender: [name] ?[name] ?[name]: 5 ?History of Present Illness ?HPI Description: ADMISSION ?01/21/2022 ?This is a 66 year old woman presenting with an ulcer on the right fifth metatarsal head. She says that it has been present for about 6 weeks. She is not certain ?how it developed, but she does have peripheral neuropathy. She also has lymphedema, hypertension, metabolic syndrome/prediabetes, but does not carry an ?actual diagnosis of diabetes nor is she on any medications for this. 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 ABI in ?clinic today was 0.8. She has been treated in the past for lymphedema, but has been unable to tolerate compression. She does have lymphedema pumps but ?does not use these regularly. She had been applying Neosporin and new skin to her wound, but it did not really change much. She was referred here by her ?primary care provider for further evaluation and management. ?01/28/2022: 1 week follow-up. Apparently there was some miscommunication with home health and they did not come out to help her with her  dressing changes. ?The patient and her husband have been doing them themselves. The wound actually looks quite good, with decreased dimensions and a very nice healthy ?surface. ?02/05/2022: The home health nurse came out during the week and the patient was happy with the care. The wound is smaller today with a clean base. ?02/12/2022: The wound continues to contract and is  quite small today. There is a bit of callus buildup, but the wound surface itself is very clean. ?02/19/2022: The wound is nearly closed. There is a small amount of callus buildup. No slough or drainage. ?02/25/2022: The wound is down to just a sliver. She continues to build up callus. No concern for infection. ?Electronic Signature(s) ?Signed: 02/25/2022 3:32:46 PM By: [name] MD FACS ?Entered By: [name] on 02/25/2022 15:32:46 ?-------------------------------------------------------------------------------- ?Physical Exam Details ?Patient Name: Date of Service: ?[name], RO [facility] 02/25/2022 2:30 PM ?Medical Record Number: [id number] ?Patient Account Number: [id number] ?Date of Birth/Sex: Treating RN: ?Jan 26, 1956 (66 y.o. Evelyn Campbell, Evelyn Campbell ?Primary Care Provider: [name] Other Clinician: ?Referring Provider: ?Treating Provider/Extender: [name] ?[name] ?[name]: 5 ?Constitutional ?. Bradycardic, asymptomatic.. . . No acute distress. ?Respiratory ?Normal work of breathing on room air. ?Notes ?02/25/2022: The wound is down to just a sliver. She continues to accumulate callus. No concern for infection. ?Electronic Signature(s) ?Signed: 02/25/2022 3:34:55 PM By: [name] MD FACS ?Entered By: [name] on 02/25/2022 15:34:55 ?-------------------------------------------------------------------------------- ?Physician Orders Details ?Patient Name: ?Date of Service: ?[name], RO [facility] 02/25/2022 2:30 PM ?Medical Record Number: [id number] ?Patient Account Number: [id number] ?Date of  Birth/Sex: ?Treating RN: ?19-Oct-1956 (66 y.o. F) Evelyn Campbell, Evelyn Campbell ?Primary Care Provider: [name] ?Other Clinician: ?Referring Provider: ?Treating Provider/Extender: [name] ?[name] ?[name]: 5 ?Verbal / Phone Orders: No ?Diagnosis Coding ?ICD-10 Coding ?Code Description ?L97.512 Non-pressure chronic ulcer of other part of right foot with fat layer exposed ?G60.9 Hereditary and idiopathic neuropathy, unspecified ?I89.0 Lymphedema, not elsewhere classified ?E66.01 Morbid (severe) obesity due to excess calories ?F63.84 Metabolic syndrome ?R73.03 Prediabetes ?Follow-up Appointments ?ppointment in 1 week. - Dr. Celine Ahr Room 3 ?Return A ?Bathing/ Shower/ Hygiene ?May shower with protection but do not get wound dressing(s) wet. - Please use cast protector (Can purchase cast protector from local ?pharmacy, such as [facility] or [facility], cost is about $18-$20) ?Edema Control - Lymphedema / SCD / Other ?Elevate legs to the level of the heart or above for 30 minutes daily and/or when sitting, a frequency of: ?Avoid standing for long periods of time. ?Patient to wear own compression stockings every day. ?Exercise regularly ?Off-Loading ?Other: - Offloading shoe on Right foot ?Home Health ?No change in wound care orders this week; continue Home Health for wound care. May utilize formulary equivalent dressing for wound ?treatment orders unless otherwise specified. ?Other Home Health Orders/Instructions: - [facility] twice a week ?Wound Treatment ?Wound #1 - Metatarsal head fifth Wound Laterality: Right ?Cleanser: Soap and Water [facility]) Every Other Day/15 Days ?Discharge Instructions: May shower and wash wound with dial antibacterial soap and water prior to dressing change. ?Cleanser: Wound Cleanser (Home Health) (Generic) Every Other Day/15 Days ?Discharge Instructions: Cleanse the wound with wound cleanser prior to applying a clean dressing using gauze sponges, not tissue or cotton  balls. ?Cleanser: [employer] Ancillary Kit - 15 Day Supply (Generic) Every Other Day/15 Days ?Discharge Instructions: Use supplies as instructed; Kit contains: (15) Saline Bullets; (15) 3x3 Gauze; 15 pr Gloves ?Prim Dressing: Promogra

## 2022-03-04 ENCOUNTER — Encounter (HOSPITAL_BASED_OUTPATIENT_CLINIC_OR_DEPARTMENT_OTHER): Payer: Medicare Other | Attending: General Surgery | Admitting: General Surgery

## 2022-03-04 DIAGNOSIS — R7303 Prediabetes: Secondary | ICD-10-CM | POA: Diagnosis not present

## 2022-03-04 DIAGNOSIS — G609 Hereditary and idiopathic neuropathy, unspecified: Secondary | ICD-10-CM | POA: Diagnosis not present

## 2022-03-04 DIAGNOSIS — L97512 Non-pressure chronic ulcer of other part of right foot with fat layer exposed: Secondary | ICD-10-CM | POA: Insufficient documentation

## 2022-03-04 DIAGNOSIS — G629 Polyneuropathy, unspecified: Secondary | ICD-10-CM | POA: Diagnosis not present

## 2022-03-04 DIAGNOSIS — Z6839 Body mass index (BMI) 39.0-39.9, adult: Secondary | ICD-10-CM | POA: Diagnosis not present

## 2022-03-04 DIAGNOSIS — I10 Essential (primary) hypertension: Secondary | ICD-10-CM | POA: Insufficient documentation

## 2022-03-04 DIAGNOSIS — I890 Lymphedema, not elsewhere classified: Secondary | ICD-10-CM | POA: Diagnosis not present

## 2022-03-04 NOTE — Progress Notes (Signed)
Evelyn, Campbell ([id number]) ?Visit Report for 03/04/2022 ?Arrival Information Details ?Patient Name: Date of Service: ?[name], RO [facility] 03/04/2022 11:30 A M ?Medical Record Number: [id number] ?Patient Account Number: [id number] ?Date of Birth/Sex: Treating RN: ?04-Apr-1956 (66 y.o. Evelyn Campbell, [name] ?Primary Care Nel Stoneking: Evelyn Campbell Other Clinician: ?Referring Newton Frutiger: ?Treating Myra Weng/Extender: [name] ?Evelyn Campbell ?[name]: 6 ?Visit Information History Since Last Visit ?Added or deleted any medications: No ?Patient Arrived: Evelyn Campbell ?Any new allergies or adverse reactions: No ?Arrival Time: 11:55 ?Had a fall or experienced change in No ?Accompanied By: alone ?activities of daily living that may affect ?Transfer Assistance: None ?risk of falls: ?Patient Identification Verified: Yes ?Signs or symptoms of abuse/neglect since last visito No ?Secondary Verification Process Completed: Yes ?Hospitalized since last visit: No ?Patient Has Alerts: Yes ?Implantable device outside of the clinic excluding No ?Patient Alerts: ABI R 1.19 ?cellular tissue based products placed in the center ?ABI L 1.12 ?since last visit: ?TBI R 0.93 ?Has Dressing in Place as Prescribed: Yes ?TBI L 0.56 ?Has Footwear/Offloading in Place as Prescribed: Yes ?Right: Wedge Shoe ?Pain Present Now: No ?Electronic Signature(s) ?Signed: 03/04/2022 6:01:57 PM By: Levan Hurst RN, BSN ?Entered By: Levan Hurst on 03/04/2022 12:02:04 ?-------------------------------------------------------------------------------- ?Clinic Level of Care Assessment Details ?Patient Name: Date of Service: ?[name], RO [facility] 03/04/2022 11:30 A M ?Medical Record Number: [id number] ?Patient Account Number: [id number] ?Date of Birth/Sex: Treating RN: ?12-18-1955 (66 y.o. Evelyn Campbell, [name] ?Primary Care Evelyn Campbell: Evelyn Campbell Other Clinician: ?Referring Evelyn Campbell: ?Treating Evelyn Campbell/Extender: [name] ?Evelyn Campbell ?[name]:  6 ?Clinic Level of Care Assessment Items ?TOOL 4 Quantity Score ?X- 1 0 ?Use when only an EandM is performed on FOLLOW-UP visit ?ASSESSMENTS - Nursing Assessment / Reassessment ?X- 1 10 ?Reassessment of Co-morbidities (includes updates in patient status) ?X- 1 5 ?Reassessment of Adherence to Treatment Plan ?ASSESSMENTS - Wound and Skin A ssessment / Reassessment ?X - Simple Wound Assessment / Reassessment - one wound 1 5 ?'[]'$  - 0 ?Complex Wound Assessment / Reassessment - multiple wounds ?'[]'$  - 0 ?Dermatologic / Skin Assessment (not related to wound area) ?ASSESSMENTS - Focused Assessment ?'[]'$  - 0 ?Circumferential Edema Measurements - multi extremities ?'[]'$  - 0 ?Nutritional Assessment / Counseling / Intervention ?X- 1 5 ?Lower Extremity Assessment (monofilament, tuning fork, pulses) ?'[]'$  - 0 ?Peripheral Arterial Disease Assessment (using hand held doppler) ?ASSESSMENTS - Ostomy and/or Continence Assessment and Care ?'[]'$  - 0 ?Incontinence Assessment and Management ?'[]'$  - 0 ?Ostomy Care Assessment and Management (repouching, etc.) ?PROCESS - Coordination of Care ?X - Simple Patient / Family Education for ongoing care 1 15 ?'[]'$  - 0 ?Complex (extensive) Patient / Family Education for ongoing care ?X- 1 10 ?Staff obtains Consents, Records, T Results / Process Orders ?est ?X- 1 10 ?Staff telephones HHA, Nursing Homes / Clarify orders / etc ?'[]'$  - 0 ?Routine Transfer to another Facility (non-emergent condition) ?'[]'$  - 0 ?Routine Hospital Admission (non-emergent condition) ?'[]'$  - 0 ?New Admissions / Biomedical engineer / Ordering NPWT Apligraf, etc. ?, ?'[]'$  - 0 ?Emergency Hospital Admission (emergent condition) ?X- 1 10 ?Simple Discharge Coordination ?'[]'$  - 0 ?Complex (extensive) Discharge Coordination ?PROCESS - Special Needs ?'[]'$  - 0 ?Pediatric / Minor Patient Management ?'[]'$  - 0 ?Isolation Patient Management ?'[]'$  - 0 ?Hearing / Language / Visual special needs ?'[]'$  - 0 ?Assessment of Community assistance (transportation, D/C  planning, etc.) ?'[]'$  - 0 ?Additional assistance / Altered mentation ?'[]'$  - 0 ?Support Surface(s) Assessment (bed, cushion, seat, etc.) ?INTERVENTIONS -  Wound Cleansing / Measurement ?X - Simple Wound Cleansing - one wound 1 5 ?'[]'$  - 0 ?Complex Wound Cleansing - multiple wounds ?X- 1 5 ?Wound Imaging (photographs - any number of wounds) ?'[]'$  - 0 ?Wound Tracing (instead of photographs) ?X- 1 5 ?Simple Wound Measurement - one wound ?'[]'$  - 0 ?Complex Wound Measurement - multiple wounds ?INTERVENTIONS - Wound Dressings ?'[]'$  - 0 ?Small Wound Dressing one or multiple wounds ?'[]'$  - 0 ?Medium Wound Dressing one or multiple wounds ?'[]'$  - 0 ?Large Wound Dressing one or multiple wounds ?'[]'$  - 0 ?Application of Medications - topical ?'[]'$  - 0 ?Application of Medications - injection ?INTERVENTIONS - Miscellaneous ?'[]'$  - 0 ?External ear exam ?'[]'$  - 0 ?Specimen Collection (cultures, biopsies, blood, body fluids, etc.) ?'[]'$  - 0 ?Specimen(s) / Culture(s) sent or taken to Lab for analysis ?'[]'$  - 0 ?Patient Transfer (multiple staff / Civil Service fast streamer / Similar devices) ?'[]'$  - 0 ?Simple Staple / Suture removal (25 or less) ?'[]'$  - 0 ?Complex Staple / Suture removal (26 or more) ?'[]'$  - 0 ?Hypo / Hyperglycemic Management (close monitor of Blood Glucose) ?'[]'$  - 0 ?Ankle / Brachial Index (ABI) - do not check if billed separately ?X- 1 5 ?Vital Signs ?Has the patient been seen at the hospital within the last three years: Yes ?Total Score: 90 ?Level Of Care: New/Established - Level 3 ?Electronic Signature(s) ?Signed: 03/04/2022 6:01:57 PM By: Levan Hurst RN, BSN ?Entered By: Levan Hurst on 03/04/2022 16:54:19 ?-------------------------------------------------------------------------------- ?Encounter Discharge Information Details ?Patient Name: Date of Service: ?[name], RO [facility] 03/04/2022 11:30 A M ?Medical Record Number: [id number] ?Patient Account Number: [id number] ?Date of Birth/Sex: Treating RN: ?[birth year] (66 y.o. Evelyn Campbell, [name] ?Primary Care Quadir Muns:  Evelyn Campbell Other Clinician: ?Referring Elora Wolter: ?Treating Destyne Goodreau/Extender: [name] ?Evelyn Campbell ?[name]: 6 ?Encounter Discharge Information Items ?Discharge Condition: Stable ?Ambulatory Status: Evelyn Campbell ?Discharge Destination: Home ?Transportation: Private Auto ?Accompanied By: alone ?Schedule Follow-up Appointment: Yes ?Clinical Summary of Care: Patient Declined ?Electronic Signature(s) ?Signed: 03/04/2022 6:01:57 PM By: Levan Hurst RN, BSN ?Entered By: Levan Hurst on 03/04/2022 16:54:44 ?-------------------------------------------------------------------------------- ?Lower Extremity Assessment Details ?Patient Name: Date of Service: ?[name], RO [facility] 03/04/2022 11:30 A M ?Medical Record Number: [id number] ?Patient Account Number: [id number] ?Date of Birth/Sex: Treating RN: ?[birth year] (66 y.o. Evelyn Campbell, [name] ?Primary Care Ibn Stief: Evelyn Campbell Other Clinician: ?Referring Dontavion Noxon: ?Treating Myldred Raju/Extender: [name] ?Evelyn Campbell ?[name]: 6 ?Edema Assessment ?Assessed: [Left: No] [Right: No] ?Edema: [Left: Ye] [Right: s] ?Calf ?Left: Right: ?Point of Measurement: 31 cm From Medial Instep 46 cm ?Ankle ?Left: Right: ?Point of Measurement: 9 cm From Medial Instep 22 cm ?Vascular Assessment ?Pulses: ?Dorsalis Pedis ?Palpable: [Right:Yes] ?Electronic Signature(s) ?Signed: 03/04/2022 6:01:57 PM By: Levan Hurst RN, BSN ?Entered By: Levan Hurst on 03/04/2022 12:02:43 ?-------------------------------------------------------------------------------- ?Multi Wound Chart Details ?Patient Name: ?Date of Service: ?[name], RO [facility] 03/04/2022 11:30 A M ?Medical Record Number: [id number] ?Patient Account Number: [id number] ?Date of Birth/Sex: ?Treating RN: ?Jan 10, 1956 (66 y.o. F) [name], [name] ?Primary Care Abbygail [place]: Evelyn Campbell ?Other Clinician: ?Referring Marcea Rojek: ?Treating Doralee Kocak/Extender: [name] ?Evelyn Campbell ?[name]:  6 ?Vital Signs ?Height(in): 65 ?Pulse(bpm): 71 ?Weight(lbs): 235 ?Blood Pressure(mmHg): 120/75 ?Body Mass Index(BMI): 39.1 ?Temperature(??F): 97.8 ?Respiratory Rate(breaths/min): 18 ?Photos: [N/A:N/A] ?Right Metatarsal head

## 2022-03-05 NOTE — Progress Notes (Signed)
DYANNA, SEITER ([id number]) ?Visit Report for 03/04/2022 ?Chief Complaint Document Details ?Patient Name: Date of Service: ?[name], RO [facility] 03/04/2022 11:30 A M ?Medical Record Number: [id number] ?Patient Account Number: [id number] ?Date of Birth/Sex: Treating RN: ?17-Aug-1956 (66 y.o. F) [name], [name] ?Primary Care Provider: [name] Other Clinician: ?Referring Provider: ?Treating Provider/Extender: [name] ?[name] ?[name]: 6 ?Information Obtained from: Patient ?Chief Complaint ?Patient seen for complaints of Non-Healing Wound. ?Electronic Signature(s) ?Signed: 03/04/2022 12:16:10 PM By: [name] MD FACS ?Entered By: [name] on 03/04/2022 12:16:09 ?-------------------------------------------------------------------------------- ?HPI Details ?Patient Name: Date of Service: ?[name], RO [facility] 03/04/2022 11:30 A M ?Medical Record Number: [id number] ?Patient Account Number: [id number] ?Date of Birth/Sex: Treating RN: ?11-Oct-1956 (66 y.o. F) [name], [name] ?Primary Care Provider: [name] Other Clinician: ?Referring Provider: ?Treating Provider/Extender: [name] ?[name] ?[name]: 6 ?History of Present Illness ?HPI Description: ADMISSION ?01/21/2022 ?This is a 66 year old woman presenting with an ulcer on the right fifth metatarsal head. She says that it has been present for about 6 weeks. She is not certain ?how it developed, but she does have peripheral neuropathy. She also has lymphedema, hypertension, metabolic syndrome/prediabetes, but does not carry an ?actual diagnosis of diabetes nor is she on any medications for this. 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 ABI in ?clinic today was 0.8. She has been treated in the past for lymphedema, but has been unable to tolerate compression. She does have lymphedema pumps but ?does not use these regularly. She had been applying Neosporin and new  skin to her wound, but it did not really change much. She was referred here by her ?primary care provider for further evaluation and management. ?01/28/2022: 1 week follow-up. Apparently there was some miscommunication with home health and they did not come out to help her with her dressing changes. ?The patient and her husband have been doing them themselves. The wound actually looks quite good, with decreased dimensions and a very nice healthy ?surface. ?02/05/2022: The home health nurse came out during the week and the patient was happy with the care. The wound is smaller today with a clean base. ?02/12/2022: The wound continues to contract and is quite small today. There is a bit of callus buildup, but the wound surface itself is very clean. ?02/19/2022: The wound is nearly closed. There is a small amount of callus buildup. No slough or drainage. ?02/25/2022: The wound is down to just a sliver. She continues to build up callus. No concern for infection. ?03/04/2022: The wound is closed. ?Electronic Signature(s) ?Signed: 03/04/2022 12:16:29 PM By: [name] MD FACS ?Entered By: [name] on 03/04/2022 12:16:29 ?-------------------------------------------------------------------------------- ?Physical Exam Details ?Patient Name: Date of Service: ?[name], RO [facility] 03/04/2022 11:30 A M ?Medical Record Number: [id number] ?Patient Account Number: [id number] ?Date of Birth/Sex: Treating RN: ?[birth year] (66 y.o. F) [name], [name] ?Primary Care Provider: [name] Other Clinician: ?Referring Provider: ?Treating Provider/Extender: [name] ?[name] ?[name]: 6 ?Constitutional ?. . . . No acute distress. ?Respiratory ?Normal work of breathing on room air. ?Notes ?03/04/2022: The wound is closed. ?Electronic Signature(s) ?Signed: 03/04/2022 12:17:00 PM By: [name] MD FACS ?Entered By: [name] on 03/04/2022  12:17:00 ?-------------------------------------------------------------------------------- ?Physician Orders Details ?Patient Name: Date of Service: ?[name], RO [facility] 03/04/2022 11:30 A M ?Medical Record Number: [id number] ?Patient Account Number: [id number] ?Date of Birth/Sex: Treating RN: ?01-08-1956 (66 y.o. [name], [name] ?Primary Care Provider:  [name] Other Clinician: ?Referring Provider: ?Treating Provider/Extender: [name] ?[name] ?[name]: 6 ?Verbal / Phone Orders: No ?Diagnosis Coding ?ICD-10 Coding ?Code Description ?L97.512 Non-pressure chronic ulcer of other part of right foot with fat layer exposed ?G60.9 Hereditary and idiopathic neuropathy, unspecified ?I89.0 Lymphedema, not elsewhere classified ?E66.01 Morbid (severe) obesity due to excess calories ?X03.83 Metabolic syndrome ?R73.03 Prediabetes ?Discharge From [facility] ?Discharge from [place] healed, congratulations!! ?Edema Control - Lymphedema / SCD / Other ?Elevate legs to the level of the heart or above for 30 minutes daily and/or when sitting, a frequency of: - throughout the day ?Avoid standing for long periods of time. ?Patient to wear own compression stockings every day. ?Exercise regularly ?Off-Loading ?Other: - Continue to wear offloading shoe for another week ?Non Wound Condition ?Protect area with: - Continue to keep right foot padded with foam for protection ?Home Health ?Discontinue home health for wound care. - Wound healed ?Other Home Health Orders/Instructions: - [facility] ?Electronic Signature(s) ?Signed: 03/04/2022 12:52:35 PM By: [name] MD FACS ?Entered By: [name] on 03/04/2022 12:18:07 ?-------------------------------------------------------------------------------- ?Problem List Details ?Patient Name: ?Date of Service: ?[name], RO [facility] 03/04/2022 11:30 A M ?Medical Record Number: [id number] ?Patient Account Number: [id number] ?Date of  Birth/Sex: ?Treating RN: ?06/25/1956 (67 y.o. [name], [name] ?Primary Care Provider: [name] ?Other Clinician: ?Referring Provider: ?Treating Provider/Extender: [name] ?[name] ?[name]: 6 ?Active Problems ?ICD-10 ?Encounter ?Code Description Active Date MDM ?Diagnosis ?Y60.600 Non-pressure chronic ulcer of other part of right foot with fat layer exposed 01/21/2022 No Yes ?G60.9 Hereditary and idiopathic neuropathy, unspecified 01/21/2022 No Yes ?I89.0 Lymphedema, not elsewhere classified 01/21/2022 No Yes ?E66.01 Morbid (severe) obesity due to excess calories 01/21/2022 No Yes ?K59.97 Metabolic syndrome 7/41/4239 No Yes ?R73.03 Prediabetes 01/21/2022 No Yes ?Inactive Problems ?Resolved Problems ?Electronic Signature(s) ?Signed: 03/04/2022 12:15:57 PM By: [name] MD FACS ?Entered By: [name] on 03/04/2022 12:15:57 ?-------------------------------------------------------------------------------- ?Progress Note Details ?Patient Name: Date of Service: ?[name], RO [facility] 03/04/2022 11:30 A M ?Medical Record Number: [id number] ?Patient Account Number: [id number] ?Date of Birth/Sex: Treating RN: ?Jun 27, 1956 (66 y.o. F) [name], [name] ?Primary Care Provider: [name] Other Clinician: ?Referring Provider: ?Treating Provider/Extender: [name] ?[name] ?[name]: 6 ?Subjective ?Chief Complaint ?Information obtained from Patient ?Patient seen for complaints of Non-Healing Wound. ?History of Present Illness (HPI) ?ADMISSION ?01/21/2022 ?This is a 66 year old woman presenting with an ulcer on the right fifth metatarsal head. She says that it has been present for about 6 weeks. She is not certain ?how it developed, but she does have peripheral neuropathy. She also has lymphedema, hypertension, metabolic syndrome/prediabetes, but does not carry an ?actual diagnosis of diabetes nor is she on any medications for this. She does have idiopathic  peripheral neuropathy and limited sensation in her feet. ABI in ?clinic today was 0.8. She has been treated in the past for lymphedema, but has been unable to tolerate compression. She does have lymphedema pumps but ?does not use these regularly. She had been applying Neosporin and new skin to h

## 2022-03-18 ENCOUNTER — Ambulatory Visit
Admission: RE | Admit: 2022-03-18 | Discharge: 2022-03-18 | Disposition: A | Discharge status: Home / Self Care | Payer: Medicare Other | Source: Ambulatory Visit | Attending: Sports Medicine | Admitting: Sports Medicine

## 2022-03-18 ENCOUNTER — Other Ambulatory Visit: Payer: Self-pay | Admitting: Sports Medicine

## 2022-03-18 DIAGNOSIS — M25572 Pain in left ankle and joints of left foot: Secondary | ICD-10-CM

## 2022-03-18 DIAGNOSIS — M79672 Pain in left foot: Secondary | ICD-10-CM

## 2022-04-12 ENCOUNTER — Encounter (HOSPITAL_BASED_OUTPATIENT_CLINIC_OR_DEPARTMENT_OTHER): Payer: Medicare Other | Attending: General Surgery | Admitting: General Surgery

## 2022-04-12 DIAGNOSIS — G609 Hereditary and idiopathic neuropathy, unspecified: Secondary | ICD-10-CM | POA: Insufficient documentation

## 2022-04-12 DIAGNOSIS — R7303 Prediabetes: Secondary | ICD-10-CM | POA: Insufficient documentation

## 2022-04-12 DIAGNOSIS — L97512 Non-pressure chronic ulcer of other part of right foot with fat layer exposed: Secondary | ICD-10-CM | POA: Diagnosis present

## 2022-04-12 DIAGNOSIS — I872 Venous insufficiency (chronic) (peripheral): Secondary | ICD-10-CM | POA: Insufficient documentation

## 2022-04-12 DIAGNOSIS — M069 Rheumatoid arthritis, unspecified: Secondary | ICD-10-CM | POA: Insufficient documentation

## 2022-04-12 NOTE — Progress Notes (Signed)
STELA, IWASAKI ([id number]) Visit Report for 04/12/2022 Abuse Risk Screen Details Patient Name: Date of Service: Evelyn Campbell [facility] 04/12/2022 1:30 PM Medical Record Number: [id number] Patient Account Number: [id number] Date of Birth/Sex: Treating RN: 08/10/1956 (66 y.o. [name] Primary Care Jerzie Bieri: [name] Other Clinician: Referring Raye Wiens: Treating Laycee Fitzsimmons/Extender: [name] [name]: 0 Abuse Risk Screen Items Answer ABUSE RISK SCREEN: Has anyone close to you tried to hurt or harm you recentlyo No Do you feel uncomfortable with anyone in your familyo No Has anyone forced you do things that you didnt want to doo No Electronic Signature(s) Signed: 04/12/2022 5:17:06 PM By: Adline Peals Entered By: Adline Peals on 04/12/2022 14:02:27 -------------------------------------------------------------------------------- Activities of Daily Living Details Patient Name: Date of Service: Evelyn Campbell [facility] 04/12/2022 1:30 PM Medical Record Number: [id number] Patient Account Number: [id number] Date of Birth/Sex: Treating RN: 03/30/1956 (66 y.o. [name] Primary Care Lissete Maestas: [name] Other Clinician: Referring Varie Machamer: Treating Maya Arcand/Extender: [name] [name]: 0 Activities of Daily Living Items Answer Activities of Daily Living (Please select one for each item) Drive Automobile Completely Able T Medications ake Completely Able Use T elephone Completely Able Care for Appearance Completely Able Use T oilet Completely Able Bath / Shower Completely Able Dress Self Completely Able Feed Self Completely Able Walk Completely Able Get In / Out Bed Completely Able Housework Completely Able Prepare Meals Completely [place] for Self Completely Able Electronic Signature(s) Signed: 04/12/2022 5:17:06 PM By: Adline Peals Entered By: Adline Peals on 04/12/2022 14:03:18 -------------------------------------------------------------------------------- Education Screening Details Patient Name: Date of Service: Evelyn Campbell [facility] 04/12/2022 1:30 PM Medical Record Number: [id number] Patient Account Number: [id number] Date of Birth/Sex: Treating RN: 1956/07/29 (67 y.o. [name] Primary Care Avedis Bevis: [name] Other Clinician: Referring Manilla Strieter: Treating Evelyn Campbell/Extender: [name] [name]: 0 Primary Learner Assessed: Patient Learning Preferences/Education Level/Primary Language Learning Preference: Explanation, Demonstration, Video, Printed Material Highest Education Level: College or Above Preferred Language: English Cognitive Barrier Language Barrier: No Translator Needed: No Memory Deficit: No Emotional Barrier: No Cultural/Religious Beliefs Affecting Medical Care: No Physical Barrier Impaired Vision: Yes Glasses Impaired Hearing: No Decreased Hand dexterity: No Knowledge/Comprehension Knowledge Level: Medium Comprehension Level: Medium Ability to understand written instructions: Medium Ability to understand verbal instructions: Medium Motivation Anxiety Level: Calm Cooperation: Cooperative Education Importance: Acknowledges Need Interest in Health Problems: Asks Questions Perception: Coherent Willingness to Engage in Self-Management Medium Activities: Readiness to Engage in Self-Management Medium Activities: Electronic Signature(s) Signed: 04/12/2022 5:17:06 PM By: Adline Peals Entered By: Adline Peals on 04/12/2022 14:04:31 -------------------------------------------------------------------------------- Fall Risk Assessment Details Patient Name: Date of Service: Evelyn Campbell [facility] 04/12/2022 1:30 PM Medical Record Number: [id number] Patient Account Number: [id number] Date of Birth/Sex: Treating  RN: 1956-09-04 (66 y.o. [name] Primary Care Evelyn Campbell: [name] Other Clinician: Referring Richard Holz: Treating Evelyn Campbell/Extender: [name] [name]: 0 Fall Risk Assessment Items Have you had 2 or more falls in the last 12 monthso 0 No Have you had any fall that resulted in injury in the last 12 monthso 0 No FALLS RISK SCREEN History of falling - immediate or within 3 months 0 No Secondary diagnosis (Do you have 2 or more medical diagnoseso) 15 Yes Ambulatory aid None/bed rest/wheelchair/nurse 0 No Crutches/cane/walker 15 Yes Furniture 0 No Intravenous therapy Access/Saline/Heparin Lock 0 No Gait/Transferring Normal/ bed rest/ wheelchair 0 No  Weak (short steps with or without shuffle, stooped but able to lift head while walking, may seek 0 No support from furniture) Impaired (short steps with shuffle, may have difficulty arising from chair, head down, impaired 0 No balance) Mental Status Oriented to own ability 0 Yes Electronic Signature(s) Signed: 04/12/2022 5:17:06 PM By: Adline Peals Entered By: Adline Peals on 04/12/2022 14:06:31 -------------------------------------------------------------------------------- Foot Assessment Details Patient Name: Date of Service: Evelyn Campbell [facility] 04/12/2022 1:30 PM Medical Record Number: [id number] Patient Account Number: [id number] Date of Birth/Sex: Treating RN: Jan 05, 1956 (66 y.o. [name] Primary Care Evelyn Campbell: [name] Other Clinician: Referring Evelyn Campbell: Treating Evelyn Campbell/Extender: [name] [name]: 0 Foot Assessment Items Site Locations + = Sensation present, - = Sensation absent, C = Callus, U = Ulcer R = Redness, W = Warmth, M = Maceration, PU = Pre-ulcerative lesion F = Fissure, S = Swelling, D = Dryness Assessment Right: Left: Other Deformity: No No Prior Foot Ulcer: No No Prior Amputation: No  No Charcot Joint: No No Ambulatory Status: Ambulatory With Help Assistance Device: Cane Gait: Steady Electronic Signature(s) Signed: 04/12/2022 5:17:06 PM By: Adline Peals Entered By: Adline Peals on 04/12/2022 14:17:47 -------------------------------------------------------------------------------- Nutrition Risk Screening Details Patient Name: Date of Service: Evelyn Campbell [facility] 04/12/2022 1:30 PM Medical Record Number: [id number] Patient Account Number: [id number] Date of Birth/Sex: Treating RN: [birth year] (66 y.o. [name] Primary Care Yarelli Decelles: [name] Other Clinician: Referring Adams Hinch: Treating Geselle Cardosa/Extender: [name] [name]: 0 Height (in): 65 Weight (lbs): 235 Body Mass Index (BMI): 39.1 Nutrition Risk Screening Items Score Screening NUTRITION RISK SCREEN: I have an illness or condition that made me change the kind and/or amount of food I eat 0 No I eat fewer than two meals per day 0 No I eat few fruits and vegetables, or milk products 0 No I have three or more drinks of beer, liquor or wine almost every day 0 No I have tooth or mouth problems that make it hard for me to eat 0 No I don't always have enough money to buy the food I need 0 No I eat alone most of the time 0 No I take three or more different prescribed or over-the-counter drugs a day 1 Yes Without wanting to, I have lost or gained 10 pounds in the last six months 0 No I am not always physically able to shop, cook and/or feed myself 0 No Nutrition Protocols Good Risk Protocol 0 No interventions needed Moderate Risk Protocol High Risk Proctocol Risk Level: Good Risk Score: 1 Electronic Signature(s) Signed: 04/12/2022 5:17:06 PM By: Adline Peals Entered By: Adline Peals on 04/12/2022 14:07:02

## 2022-04-12 NOTE — Progress Notes (Signed)
Evelyn Campbell, Evelyn Campbell ([id number]) Visit Report for 04/12/2022 Allergy List Details Patient Name: Date of Service: [name], [place] 04/12/2022 1:30 PM Medical Record Number: [id number] Patient Account Number: [id number] Date of Birth/Sex: Treating RN: Oct 05, 1956 (66 y.o. Evelyn Campbell Primary Care Raine Blodgett: Evelyn Campbell Other Clinician: Referring Kaelob Persky: Treating Terrance Usery/Extender: [name] [name]: 0 Allergies Active Allergies celecoxib ibuprofen meloxicam Lipitor amoxicillin penicillin Statins-HMG-CoA Reductase Inhibitors Allergy Notes Electronic Signature(s) Signed: 04/12/2022 5:17:06 PM By: Adline Peals Entered By: Adline Peals on 04/12/2022 12:43:23 -------------------------------------------------------------------------------- Arrival Information Details Patient Name: Date of Service: [name], RO [facility] 04/12/2022 1:30 PM Medical Record Number: [id number] Patient Account Number: [id number] Date of Birth/Sex: Treating RN: 12-14-1955 (66 y.o. Evelyn Campbell Primary Care Obryan Radu: Evelyn Campbell Other Clinician: Referring Evelyn Campbell: Treating Heiley Shaikh/Extender: [name] [name]: 0 Visit Information Patient Arrived: [name] Time: 13:38 Accompanied By: spouse Transfer Assistance: None Patient Identification Verified: Yes Secondary Verification Process Completed: Yes Patient Requires Transmission-Based Precautions: No Patient Has Alerts: Yes Patient Alerts: Patient on Blood Thinner History Since Last Visit Electronic Signature(s) Signed: 04/12/2022 5:17:06 PM By: Adline Peals Signed: 04/12/2022 5:17:06 PM By: Adline Peals Entered By: Adline Peals on 04/12/2022 13:42:34 -------------------------------------------------------------------------------- Clinic Level of Care Assessment Details Patient Name: Date of Service: [name], RO [facility] 04/12/2022  1:30 PM Medical Record Number: [id number] Patient Account Number: [id number] Date of Birth/Sex: Treating RN: 08-22-1956 (66 y.o. Evelyn Campbell Primary Care Evelyn Campbell: Evelyn Campbell Other Clinician: Referring Evelyn Campbell: Treating Markiesha Delia/Extender: [name] [name]: 0 Clinic Level of Care Assessment Items TOOL 1 Quantity Score X- 1 0 Use when EandM and Procedure is performed on INITIAL visit ASSESSMENTS - Nursing Assessment / Reassessment X- 1 20 General Physical Exam (combine w/ comprehensive assessment (listed just below) when performed on new pt. evals) X- 1 25 Comprehensive Assessment (HX, ROS, Risk Assessments, Wounds Hx, etc.) ASSESSMENTS - Wound and Skin Assessment / Reassessment []  - 0 Dermatologic / Skin Assessment (not related to wound area) ASSESSMENTS - Ostomy and/or Continence Assessment and Care []  - 0 Incontinence Assessment and Management []  - 0 Ostomy Care Assessment and Management (repouching, etc.) PROCESS - Coordination of Care X - Simple Patient / Family Education for ongoing care 1 15 []  - 0 Complex (extensive) Patient / Family Education for ongoing care X- 1 10 Staff obtains Programmer, systems, Records, T Results / Process Orders est []  - 0 Staff telephones HHA, Nursing Homes / Clarify orders / etc []  - 0 Routine Transfer to another Facility (non-emergent condition) []  - 0 Routine Hospital Admission (non-emergent condition) X- 1 15 New Admissions / Biomedical engineer / Ordering NPWT Apligraf, etc. , []  - 0 Emergency Hospital Admission (emergent condition) PROCESS - Special Needs []  - 0 Pediatric / Minor Patient Management []  - 0 Isolation Patient Management []  - 0 Hearing / Language / Visual special needs []  - 0 Assessment of Community assistance (transportation, D/C planning, etc.) []  - 0 Additional assistance / Altered mentation []  - 0 Support Surface(s) Assessment (bed, cushion, seat,  etc.) INTERVENTIONS - Miscellaneous []  - 0 External ear exam []  - 0 Patient Transfer (multiple staff / Civil Service fast streamer / Similar devices) []  - 0 Simple Staple / Suture removal (25 or less) []  - 0 Complex Staple / Suture removal (26 or more) []  - 0 Hypo/Hyperglycemic Management (do not check if billed separately) X- 1 15 Ankle / Brachial Index (ABI) - do not check if billed  separately Has the patient been seen at the hospital within the last three years: Yes Total Score: 100 Level Of Care: New/Established - Level 3 Electronic Signature(s) Signed: 04/12/2022 5:17:06 PM By: Adline Peals Entered By: Adline Peals on 04/12/2022 16:53:02 -------------------------------------------------------------------------------- Encounter Discharge Information Details Patient Name: Date of Service: [name], RO [facility] 04/12/2022 1:30 PM Medical Record Number: [id number] Patient Account Number: [id number] Date of Birth/Sex: Treating RN: [birth year] (66 y.o. Evelyn Campbell Primary Care Evelyn Campbell: Evelyn Campbell Other Clinician: Referring Evelyn Campbell: Treating Evelyn Campbell/Extender: [name] [name]: 0 Encounter Discharge Information Items Post Procedure Vitals Discharge Condition: Stable Temperature (F): 98 Ambulatory Status: Cane Pulse (bpm): 60 Discharge Destination: Home Respiratory Rate (breaths/min): 18 Transportation: Private Auto Blood Pressure (mmHg): 117/80 Accompanied By: self Schedule Follow-up Appointment: Yes Clinical Summary of Care: Patient Declined Electronic Signature(s) Signed: 04/12/2022 5:17:06 PM By: Adline Peals Entered By: Adline Peals on 04/12/2022 16:53:43 -------------------------------------------------------------------------------- Lower Extremity Assessment Details Patient Name: Date of Service: [name], RO [facility] 04/12/2022 1:30 PM Medical Record Number: [id number] Patient Account Number: [id number] Date  of Birth/Sex: Treating RN: 07-20-1956 (66 y.o. Evelyn Campbell Primary Care Evelyn Campbell: Evelyn Campbell Other Clinician: Referring Maki Hege: Treating Evelyn Campbell/Extender: [name] [name]: 0 Edema Assessment Assessed: [Left: No] [Right: No] E[Left: dema] [Right: :] Calf Left: Right: Point of Measurement: From Medial Instep 47.1 cm Ankle Left: Right: Point of Measurement: From Medial Instep 22.1 cm Vascular Assessment Pulses: Dorsalis Pedis Palpable: [Right:Yes] Notes R 1.19 Electronic Signature(s) Signed: 04/12/2022 5:17:06 PM By: Adline Peals Entered By: Adline Peals on 04/12/2022 14:19:02 -------------------------------------------------------------------------------- Multi Wound Chart Details Patient Name: Date of Service: [name], RO [facility] 04/12/2022 1:30 PM Medical Record Number: [id number] Patient Account Number: [id number] Date of Birth/Sex: Treating RN: Dec 22, 1955 (66 y.o. Evelyn Campbell Primary Care Janes Colegrove: Evelyn Campbell Other Clinician: Referring Kahmari Koller: Treating Sevyn Markham/Extender: [name] [name]: 0 Vital Signs Height(in): 65 Pulse(bpm): 60 Weight(lbs): 235 Blood Pressure(mmHg): 117/80 Body Mass Index(BMI): 39.1 Temperature(F): 98 Respiratory Rate(breaths/min): 18 Photos: [1:No Photos Right Metatarsal head fifth] [2:No Photos Right, Lateral Foot] [N/A:N/A N/A] Wound Location: [1:Gradually Appeared] [2:Blister] [N/A:N/A] Wounding Event: [1:Lymphedema] [2:Venous Leg Ulcer] [N/A:N/A] Primary Etiology: [1:Lymphedema, Hypertension,] [2:Lymphedema, Hypertension,] [N/A:N/A] Comorbid History: [1:Peripheral Venous Disease, Gout, Rheumatoid Arthritis, Neuropathy 12/26/2021] [2:Peripheral Venous Disease, Gout, Rheumatoid Arthritis, Neuropathy 04/09/2022] [N/A:N/A] Date Acquired: [1:11] [2:0] [N/A:N/A] Weeks of Treatment: [1:Open] [2:Open] [N/A:N/A] Wound Status:  [1:No] [2:No] [N/A:N/A] Wound Recurrence: [1:1x0.1x0.1] [2:2.2x2.1x0.1] [N/A:N/A] Measurements L x W x D (cm) [1:0.079] [2:3.629] [N/A:N/A] A (cm) : rea [1:0.008] [2:0.363] [N/A:N/A] Volume (cm) : [1:58.00%] [2:N/A] [N/A:N/A] % Reduction in A [1:rea: 89.30%] [2:N/A] [N/A:N/A] % Reduction in Volume: [1:Full Thickness Without Exposed] [2:Full Thickness Without Exposed] [N/A:N/A] Classification: [1:Support Structures Medium] [2:Support Structures Medium] [N/A:N/A] Exudate A mount: [1:Serosanguineous] [2:Serosanguineous] [N/A:N/A] Exudate Type: [1:red, brown] [2:red, brown] [N/A:N/A] Exudate Color: [1:Distinct, outline attached] [2:Distinct, outline attached] [N/A:N/A] Wound Margin: [1:Large (67-100%)] [2:Small (1-33%)] [N/A:N/A] Granulation A mount: [1:Red] [2:Red] [N/A:N/A] Granulation Quality: [1:Small (1-33%)] [2:Large (67-100%)] [N/A:N/A] Necrotic A mount: [1:Eschar] [2:Adherent Slough] [N/A:N/A] Necrotic Tissue: [1:Fat Layer (Subcutaneous Tissue): Yes Fat Layer (Subcutaneous Tissue): Yes N/A] Exposed Structures: [1:Fascia: No Tendon: No Muscle: No Joint: No Bone: No Small (1-33%)] [2:Fascia: No Tendon: No Muscle: No Joint: No Bone: No Small (1-33%)] [N/A:N/A] Epithelialization: [1:Debridement - Excisional] [2:Debridement - Excisional] [N/A:N/A] Debridement: Pre-procedure Verification/Time Out 14:25 [2:14:25] [N/A:N/A] Taken: [1:Other] [2:Other] [N/A:N/A] Pain Control: [1:Callus, Subcutaneous] [2:Subcutaneous, Slough] [N/A:N/A] Tissue Debrided: [1:Skin/Subcutaneous Tissue] [  2:Skin/Subcutaneous Tissue] [N/A:N/A] Level: [1:0.1] [2:4.62] [N/A:N/A] Debridement A (sq cm): [1:rea Curette] [2:Curette] [N/A:N/A] Instrument: [1:Minimum] [2:Minimum] [N/A:N/A] Bleeding: [1:Pressure] [2:Pressure] [N/A:N/A] Hemostasis A chieved: [1:0] [2:0] [N/A:N/A] Procedural Pain: [1:0] [2:0] [N/A:N/A] Post Procedural Pain: [1:Procedure was tolerated well] [2:Procedure was tolerated well]  [N/A:N/A] Debridement Treatment Response: [1:1x0.1x0.1] [2:2.2x2.1x0.1] [N/A:N/A] Post Debridement Measurements L x W x D (cm) [1:0.008] [2:0.363] [N/A:N/A] Post Debridement Volume: (cm) [1:Debridement] [2:Debridement] [N/A:N/A] Treatment Notes Electronic Signature(s) Signed: 04/12/2022 2:49:23 PM By: [name] MD FACS Signed: 04/12/2022 5:17:06 PM By: Adline Peals Entered By: [name] on 04/12/2022 14:49:22 -------------------------------------------------------------------------------- Multi-Disciplinary Care Plan Details Patient Name: Date of Service: [name], RO [facility] 04/12/2022 1:30 PM Medical Record Number: [id number] Patient Account Number: [id number] Date of Birth/Sex: Treating RN: Dec 18, 1955 (66 y.o. Evelyn Campbell Primary Care Channon Brougher: Evelyn Campbell Other Clinician: Referring Halston Fairclough: Treating Sahand Gosch/Extender: [name] [name]: 0 Active Inactive Wound/Skin Impairment Nursing Diagnoses: Impaired tissue integrity Knowledge deficit related to ulceration/compromised skin integrity Goals: Patient/caregiver will verbalize understanding of skin care regimen Date Initiated: 04/12/2022 Target Resolution Date: 05/07/2022 Goal Status: Active Ulcer/skin breakdown will have a volume reduction of 30% by week 4 Date Initiated: 04/12/2022 Target Resolution Date: 05/07/2022 Goal Status: Active Interventions: Assess patient/caregiver ability to obtain necessary supplies Assess patient/caregiver ability to perform ulcer/skin care regimen upon admission and as needed Assess ulceration(s) every visit Treatment Activities: Skin care regimen initiated : 04/12/2022 Topical wound management initiated : 04/12/2022 Notes: Electronic Signature(s) Signed: 04/12/2022 5:17:06 PM By: Adline Peals Entered By: Adline Peals on 04/12/2022  13:47:14 -------------------------------------------------------------------------------- Pain Assessment Details Patient Name: Date of Service: [name], RO [facility] 04/12/2022 1:30 PM Medical Record Number: [id number] Patient Account Number: [id number] Date of Birth/Sex: Treating RN: [birth year] (66 y.o. Evelyn Campbell Primary Care Alisia Vanengen: Evelyn Campbell Other Clinician: Referring Jarnell Cordaro: Treating Zyliah Schier/Extender: [name] [name]: 0 Active Problems Location of Pain Severity and Description of Pain Patient Has Paino Yes Site Locations Pain Location: Pain in Ulcers Rate the pain. Current Pain Level: 2 Character of Pain Describe the Pain: Tender Pain Management and Medication Current Pain Management: Electronic Signature(s) Signed: 04/12/2022 5:17:06 PM By: Adline Peals Entered By: Adline Peals on 04/12/2022 14:20:58 -------------------------------------------------------------------------------- Patient/Caregiver Education Details Patient Name: Date of Service: [name], RO [facility] 6/12/2023andnbsp1:30 PM Medical Record Number: [id number] Patient Account Number: [id number] Date of Birth/Gender: Treating RN: December 24, 1955 (66 y.o. Evelyn Campbell Primary Care Physician: Evelyn Campbell Other Clinician: Referring Physician: Treating Physician/Extender: [name] [name]: 0 Education Assessment Education Provided To: Patient Education Topics Provided Wound Debridement: Methods: Explain/Verbal Responses: Reinforcements needed, State content correctly Electronic Signature(s) Signed: 04/12/2022 5:17:06 PM By: Adline Peals Entered By: Adline Peals on 04/12/2022 13:59:22 -------------------------------------------------------------------------------- Wound Assessment Details Patient Name: Date of Service: [name], RO [facility] 04/12/2022 1:30 PM Medical Record Number:  [id number] Patient Account Number: [id number] Date of Birth/Sex: Treating RN: 1956-08-16 (66 y.o. Evelyn Campbell Primary Care Lovette Merta: Evelyn Campbell Other Clinician: Referring Adal Sereno: Treating Jaiya Mooradian/Extender: [name] [name]: 0 Wound Status Wound Number: 1 Primary Lymphedema Etiology: Wound Location: Right Metatarsal head fifth Wound Open Wounding Event: Gradually Appeared Status: Date Acquired: 12/26/2021 Comorbid Lymphedema, Hypertension, Peripheral Venous Disease, Gout, Weeks Of Treatment: 11 History: Rheumatoid Arthritis, Neuropathy Clustered Wound: No Wound Measurements Length: (cm) 1 Width: (cm) 0.1 Depth: (cm) 0.1 Area: (cm) 0.079 Volume: (cm) 0.008 % Reduction in Area: 58% % Reduction in Volume: 89.3%  Epithelialization: Small (1-33%) Tunneling: No Undermining: No Wound Description Classification: Full Thickness Without Exposed Support Structures Wound Margin: Distinct, outline attached Exudate Amount: Medium Exudate Type: Serosanguineous Exudate Color: red, brown Foul Odor After Cleansing: No Slough/Fibrino No Wound Bed Granulation Amount: Large (67-100%) Exposed Structure Granulation Quality: Red Fascia Exposed: No Necrotic Amount: Small (1-33%) Fat Layer (Subcutaneous Tissue) Exposed: Yes Necrotic Quality: Eschar Tendon Exposed: No Muscle Exposed: No Joint Exposed: No Bone Exposed: No Treatment Notes Wound #1 (Metatarsal head fifth) Wound Laterality: Right Cleanser Soap and Water Discharge Instruction: May shower and wash wound with dial antibacterial soap and water prior to dressing change. Wound Cleanser Discharge Instruction: Cleanse the wound with wound cleanser prior to applying a clean dressing using gauze sponges, not tissue or cotton balls. [place] Ancillary Kit - 15 Day Supply Discharge Instruction: Use supplies as instructed; Kit contains: (15) Saline Bullets; (15) 3x3 Gauze; 15 pr  Gloves Peri-Wound Care Topical Primary Dressing KerraCel Ag Gelling Fiber Dressing, 2x2 in (silver alginate) Discharge Instruction: Apply silver alginate to wound bed as instructed Secondary Dressing Optifoam Non-Adhesive Dressing, 4x4 in Discharge Instruction: Apply over primary dressing as directed. Woven Gauze Sponges 2x2 in Discharge Instruction: Apply over primary dressing as directed. Secured With Elastic Bandage 4 inch (ACE bandage) Discharge Instruction: Secure with ACE bandage as directed. Kerlix Roll Sterile, 4.5x3.1 (in/yd) Discharge Instruction: Secure with Kerlix as directed. 33M Medipore H Soft Cloth Surgical T ape, 4 x 10 (in/yd) Discharge Instruction: Secure with tape as directed. Compression Wrap Compression Stockings Add-Ons Electronic Signature(s) Signed: 04/12/2022 5:17:06 PM By: Adline Peals Entered By: Adline Peals on 04/12/2022 14:14:32 -------------------------------------------------------------------------------- Wound Assessment Details Patient Name: Date of Service: [name], [place] 04/12/2022 1:30 PM Medical Record Number: [id number] Patient Account Number: [id number] Date of Birth/Sex: Treating RN: Nov 26, 1955 (66 y.o. Evelyn Campbell Primary Care Daymeon Fischman: Evelyn Campbell Other Clinician: Referring Nelsy Madonna: Treating Zeidy Tayag/Extender: [name] [name]: 0 Wound Status Wound Number: 2 Primary Venous Leg Ulcer Etiology: Wound Location: Right, Lateral Foot Wound Open Wounding Event: Blister Status: Date Acquired: 04/09/2022 Comorbid Lymphedema, Hypertension, Peripheral Venous Disease, Gout, Weeks Of Treatment: 0 History: Rheumatoid Arthritis, Neuropathy Clustered Wound: No Wound Measurements Length: (cm) 2.2 Width: (cm) 2.1 Depth: (cm) 0.1 Area: (cm) 3.629 Volume: (cm) 0.363 % Reduction in Area: % Reduction in Volume: Epithelialization: Small (1-33%) Tunneling: No Undermining:  No Wound Description Classification: Full Thickness Without Exposed Support Structures Wound Margin: Distinct, outline attached Exudate Amount: Medium Exudate Type: Serosanguineous Exudate Color: red, brown Foul Odor After Cleansing: No Slough/Fibrino Yes Wound Bed Granulation Amount: Small (1-33%) Exposed Structure Granulation Quality: Red Fascia Exposed: No Necrotic Amount: Large (67-100%) Fat Layer (Subcutaneous Tissue) Exposed: Yes Necrotic Quality: Adherent Slough Tendon Exposed: No Muscle Exposed: No Joint Exposed: No Bone Exposed: No Treatment Notes Wound #2 (Foot) Wound Laterality: Right, Lateral Cleanser Soap and Water Discharge Instruction: May shower and wash wound with dial antibacterial soap and water prior to dressing change. Wound Cleanser Discharge Instruction: Cleanse the wound with wound cleanser prior to applying a clean dressing using gauze sponges, not tissue or cotton balls. Peri-Wound Care Topical Gentamicin Discharge Instruction: As directed by physician Primary Dressing KerraCel Ag Gelling Fiber Dressing, 2x2 in (silver alginate) Discharge Instruction: Apply silver alginate to wound bed as instructed Secondary Dressing Woven Gauze Sponge, Non-Sterile 4x4 in Discharge Instruction: Apply over primary dressing as directed. Secured With Elastic Bandage 4 inch (ACE bandage) Discharge Instruction: Secure with ACE bandage as directed. Kerlix Roll Sterile, 4.5x3.1 (  in/yd) Discharge Instruction: Secure with Kerlix as directed. 60M Medipore H Soft Cloth Surgical T ape, 4 x 10 (in/yd) Discharge Instruction: Secure with tape as directed. Compression Wrap Compression Stockings Add-Ons Electronic Signature(s) Signed: 04/12/2022 5:17:06 PM By: Adline Peals Entered By: Adline Peals on 04/12/2022 14:12:53 -------------------------------------------------------------------------------- Vitals Details Patient Name: Date of Service: [name], [place] 04/12/2022 1:30 PM Medical Record Number: [id number] Patient Account Number: [id number] Date of Birth/Sex: Treating RN: 01-09-1956 (66 y.o. Evelyn Campbell Primary Care Mouhamed Glassco: Evelyn Campbell Other Clinician: Referring Lailana Shira: Treating Tyrena Gohr/Extender: [name] [name]: 0 Vital Signs Time Taken: 13:44 Temperature (F): 98 Height (in): 65 Pulse (bpm): 60 Source: Stated Respiratory Rate (breaths/min): 18 Weight (lbs): 235 Blood Pressure (mmHg): 117/80 Source: Stated Reference Range: 80 - 120 mg / dl Body Mass Index (BMI): 39.1 Electronic Signature(s) Signed: 04/12/2022 5:17:06 PM By: Adline Peals Entered By: Adline Peals on 04/12/2022 13:44:45

## 2022-04-13 NOTE — Progress Notes (Signed)
ELLORA, VARNUM ([id number]) Visit Report for 04/12/2022 Chief Complaint Document Details Patient Name: Date of Service: [name] [facility] 04/12/2022 1:30 PM Medical Record Number: [id number] Patient Account Number: [id number] Date of Birth/Sex: Treating RN: July 09, 1956 (66 y.o. [name] Primary Care Provider: [name] Other Clinician: Referring Provider: Treating Provider/Extender: [name] [name]: 0 Information Obtained from: Patient Chief Complaint Patient seen for complaints of Non-Healing Wound. Electronic Signature(s) Signed: 04/12/2022 2:49:32 PM By: [name] MD FACS Entered By: [name] on 04/12/2022 14:49:32 -------------------------------------------------------------------------------- Debridement Details Patient Name: Date of Service: [name], [place] 04/12/2022 1:30 PM Medical Record Number: [id number] Patient Account Number: [id number] Date of Birth/Sex: Treating RN: January 19, 1956 (66 y.o. [name] Primary Care Provider: [name] Other Clinician: Referring Provider: Treating Provider/Extender: [name] [name]: 0 Debridement Performed for Assessment: Wound #1 Right Metatarsal head fifth Performed By: Physician [name], MD Debridement Type: Debridement Level of Consciousness (Pre-procedure): Awake and Alert Pre-procedure Verification/Time Out Yes - 14:25 Taken: Start Time: 14:28 Pain Control: Other : benzocaine 20 spray T Area Debrided (L x W): otal 1 (cm) x 0.1 (cm) = 0.1 (cm) Tissue and other material debrided: Viable, Non-Viable, Callus, Subcutaneous, Skin: Epidermis Level: Skin/Subcutaneous Tissue Debridement Description: Excisional Instrument: Curette Bleeding: Minimum Hemostasis Achieved: Pressure Procedural Pain: 0 Post Procedural Pain: 0 Response to Treatment: Procedure was tolerated well Level of Consciousness  (Post- Awake and Alert procedure): Post Debridement Measurements of Total Wound Length: (cm) 1 Width: (cm) 0.1 Depth: (cm) 0.1 Volume: (cm) 0.008 Character of Wound/Ulcer Post Debridement: Improved Post Procedure Diagnosis Same as Pre-procedure Electronic Signature(s) Signed: 04/12/2022 3:22:33 PM By: [name] MD FACS Signed: 04/12/2022 5:17:06 PM By: Adline Peals Entered By: Adline Peals on 04/12/2022 14:29:10 -------------------------------------------------------------------------------- Debridement Details Patient Name: Date of Service: [name], RO [facility] 04/12/2022 1:30 PM Medical Record Number: [id number] Patient Account Number: [id number] Date of Birth/Sex: Treating RN: November 18, 1955 (66 y.o. [name] Primary Care Provider: [name] Other Clinician: Referring Provider: Treating Provider/Extender: [name] [name]: 0 Debridement Performed for Assessment: Wound #2 Right,Lateral Foot Performed By: Physician [name], MD Debridement Type: Debridement Severity of Tissue Pre Debridement: Fat layer exposed Level of Consciousness (Pre-procedure): Awake and Alert Pre-procedure Verification/Time Out Yes - 14:25 Taken: Start Time: 14:28 Pain Control: Other : benzocaine 20 spray T Area Debrided (L x W): otal 2.2 (cm) x 2.1 (cm) = 4.62 (cm) Tissue and other material debrided: Viable, Non-Viable, Slough, Subcutaneous, Skin: Epidermis, Slough Level: Skin/Subcutaneous Tissue Debridement Description: Excisional Instrument: Curette Bleeding: Minimum Hemostasis Achieved: Pressure Procedural Pain: 0 Post Procedural Pain: 0 Response to Treatment: Procedure was tolerated well Level of Consciousness (Post- Awake and Alert procedure): Post Debridement Measurements of Total Wound Length: (cm) 2.2 Width: (cm) 2.1 Depth: (cm) 0.1 Volume: (cm) 0.363 Character of Wound/Ulcer Post Debridement:  Improved Severity of Tissue Post Debridement: Fat layer exposed Post Procedure Diagnosis Same as Pre-procedure Electronic Signature(s) Signed: 04/12/2022 3:22:33 PM By: [name] MD FACS Signed: 04/12/2022 5:17:06 PM By: Adline Peals Entered By: Adline Peals on 04/12/2022 14:33:04 -------------------------------------------------------------------------------- HPI Details Patient Name: Date of Service: [name], RO [facility] 04/12/2022 1:30 PM Medical Record Number: [id number] Patient Account Number: [id number] Date of Birth/Sex: Treating RN: 09-Mar-1956 (66 y.o. [name] Primary Care Provider: [name] Other Clinician: Referring Provider: Treating Provider/Extender: [name] [name]: 0 History of Present Illness HPI Description:  ADMISSION 01/21/2022 This is a 66 year old woman presenting with an ulcer on the right fifth metatarsal head. She says that it has been present for about 6 weeks. She is not certain how it developed, but she does have peripheral neuropathy. She also has lymphedema, hypertension, metabolic syndrome/prediabetes, but does not carry an actual diagnosis of diabetes nor is she on any medications for this. 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 ABI in clinic today was 0.8. She has been treated in the past for lymphedema, but has been unable to tolerate compression. She does have lymphedema pumps but does not use these regularly. She had been applying Neosporin and new skin to her wound, but it did not really change much. She was referred here by her primary care provider for further evaluation and management. 01/28/2022: 1 week follow-up. Apparently there was some miscommunication with home health and they did not come out to help her with her dressing changes. The patient and her husband have been doing them themselves. The wound actually looks quite good,  with decreased dimensions and a very nice healthy surface. 02/05/2022: The home health nurse came out during the week and the patient was happy with the care. The wound is smaller today with a clean base. 02/12/2022: The wound continues to contract and is quite small today. There is a bit of callus buildup, but the wound surface itself is very clean. 02/19/2022: The wound is nearly closed. There is a small amount of callus buildup. No slough or drainage. 02/25/2022: The wound is down to just a sliver. She continues to build up callus. No concern for infection. 03/04/2022: The wound is closed. READMISSION 04/12/2022 Last week, the patient noticed a spot of blood on her stocking where her wound had been. She noted that the wound was open. She began applying the Prisma silver collagen that we had used during her last admission. She wrapped it up and on Friday when she changed the dressing, she noticed that there was a new wound on the dorsal part of her right foot overlying the fifth metatarsal. Today it is quite red and tender. There is slough on the surface. The wound on the plantar surface has periwound callus accumulation but otherwise appears fairly clean. Electronic Signature(s) Signed: 04/12/2022 2:55:05 PM By: [name] MD FACS Entered By: [name] on 04/12/2022 14:55:05 -------------------------------------------------------------------------------- Physical Exam Details Patient Name: Date of Service: [name], [place] 04/12/2022 1:30 PM Medical Record Number: [id number] Patient Account Number: [id number] Date of Birth/Sex: Treating RN: May 10, 1956 (66 y.o. [name] Primary Care Provider: [name] Other Clinician: Referring Provider: Treating Provider/Extender: [name] [name]: 0 Constitutional . . . . No acute distress. Respiratory Normal work of breathing on supplemental oxygen. Notes 04/12/2022: On the plantar  surface of the right fifth metatarsal head, there is accumulated callus with a narrow slit in it, exposing the fat layer on her foot. On the dorsal surface of her foot at the fifth metatarsal, there is denuding of the skin as well as exposure of the fat layer. The surrounding periwound tissues are red and inflamed-appearing. There is slough accumulation on the wound surface. Electronic Signature(s) Signed: 04/12/2022 2:57:09 PM By: [name] MD FACS Entered By: [name] on 04/12/2022 14:57:09 -------------------------------------------------------------------------------- Physician Orders Details Patient Name: Date of Service: [name], RO [facility] 04/12/2022 1:30 PM Medical Record Number: [id number] Patient Account Number: [id number] Date of Birth/Sex: Treating RN: 1956-07-27 (66 y.o.  [name] Primary Care Provider: [name] Other Clinician: Referring Provider: Treating Provider/Extender: [name] [name]: 0 Verbal / Phone Orders: No Diagnosis Coding ICD-10 Coding Code Description [phone] Non-pressure chronic ulcer of other part of right foot with fat layer exposed I87.2 Venous insufficiency (chronic) (peripheral) G60.9 Hereditary and idiopathic neuropathy, unspecified R73.03 Prediabetes Follow-up Appointments ppointment in 1 week. - Dr. Celine Ahr - Room 2 - 6/19 at 1:30 PM Return A Bathing/ Shower/ Hygiene May shower with protection but do not get wound dressing(s) wet. - Please use cast protector (Can purchase cast protector from local pharmacy, such as [facility] or [facility]) Edema Control - Lymphedema / SCD / Other Elevate legs to the level of the heart or above for 30 minutes daily and/or when sitting, a frequency of: Avoid standing for long periods of time. Patient to wear own compression stockings every day. - L left Exercise regularly Off-Loading Other: - Offloading shoe on Right foot Wound Treatment Wound #1  - Metatarsal head fifth Wound Laterality: Right Cleanser: Soap and Water Every Other Day/10 Days Discharge Instructions: May shower and wash wound with dial antibacterial soap and water prior to dressing change. Cleanser: Wound Cleanser (Generic) Every Other Day/10 Days Discharge Instructions: Cleanse the wound with wound cleanser prior to applying a clean dressing using gauze sponges, not tissue or cotton balls. Cleanser: [place] Ancillary Kit - 15 Day Supply (DME) (Generic) Every Other Day/10 Days Discharge Instructions: Use supplies as instructed; Kit contains: (15) Saline Bullets; (15) 3x3 Gauze; 15 pr Gloves Topical: Gentamicin Every Other Day/10 Days Discharge Instructions: As directed by physician Prim Dressing: KerraCel Ag Gelling Fiber Dressing, 2x2 in (silver alginate) (DME) (Generic) Every Other Day/10 Days ary Discharge Instructions: Apply silver alginate to wound bed as instructed Secondary Dressing: Optifoam Non-Adhesive Dressing, 4x4 in (DME) (Generic) Every Other Day/10 Days Discharge Instructions: Apply over primary dressing as directed. Secondary Dressing: Woven Gauze Sponges 2x2 in (DME) (Generic) Every Other Day/10 Days Discharge Instructions: Apply over primary dressing as directed. Secured With: Elastic Bandage 4 inch (ACE bandage) (DME) (Generic) Every Other Day/10 Days Discharge Instructions: Secure with ACE bandage as directed. Secured With: [employer], 4.5x3.1 (in/yd) (DME) (Generic) Every Other Day/10 Days Discharge Instructions: Secure with Kerlix as directed. Secured With: 76M Medipore H Soft Cloth Surgical T ape, 4 x 10 (in/yd) (DME) (Generic) Every Other Day/10 Days Discharge Instructions: Secure with tape as directed. Wound #2 - Foot Wound Laterality: Right, Lateral Cleanser: Soap and Water Every Other Day/10 Days Discharge Instructions: May shower and wash wound with dial antibacterial soap and water prior to dressing change. Cleanser: Wound Cleanser  Every Other Day/10 Days Discharge Instructions: Cleanse the wound with wound cleanser prior to applying a clean dressing using gauze sponges, not tissue or cotton balls. Topical: Gentamicin Every Other Day/10 Days Discharge Instructions: As directed by physician Prim Dressing: KerraCel Ag Gelling Fiber Dressing, 2x2 in (silver alginate) (DME) (Generic) Every Other Day/10 Days ary Discharge Instructions: Apply silver alginate to wound bed as instructed Secondary Dressing: Woven Gauze Sponge, Non-Sterile 4x4 in (DME) (Generic) Every Other Day/10 Days Discharge Instructions: Apply over primary dressing as directed. Secured With: Elastic Bandage 4 inch (ACE bandage) (DME) (Generic) Every Other Day/10 Days Discharge Instructions: Secure with ACE bandage as directed. Secured With: [employer], 4.5x3.1 (in/yd) (DME) (Generic) Every Other Day/10 Days Discharge Instructions: Secure with Kerlix as directed. Secured With: 76M Medipore H Soft Cloth Surgical T ape, 4 x 10 (in/yd) (DME) (Generic)  Every Other Day/10 Days Discharge Instructions: Secure with tape as directed. Laboratory naerobe culture (MICRO) - PCR of R lateral foot wound - (ICD10 L97.512 - Non-pressure chronic Bacteria identified in Unspecified specimen by A ulcer of other part of right foot with fat layer exposed) LOINC Code: 818-5 Convenience Name: Anaerobic culture Patient Medications llergies: celecoxib, ibuprofen, meloxicam, Lipitor, amoxicillin, penicillin, Statins-HMG-CoA Reductase Inhibitors A Notifications Medication Indication Start End 04/12/2022 gentamicin DOSE topical 0.1 % cream - apply to wound surface with dressing changes as instructed Electronic Signature(s) Signed: 04/12/2022 5:17:06 PM By: Adline Peals Signed: 04/13/2022 7:33:34 AM By: [name] MD FACS Previous Signature: 04/12/2022 2:58:28 PM Version By: [name] MD FACS Entered By: Adline Peals on 04/12/2022  16:57:45 -------------------------------------------------------------------------------- Problem List Details Patient Name: Date of Service: [name], [place] 04/12/2022 1:30 PM Medical Record Number: [id number] Patient Account Number: [id number] Date of Birth/Sex: Treating RN: Dec 27, 1955 (66 y.o. [name] Primary Care Provider: [name] Other Clinician: Referring Provider: Treating Provider/Extender: [name] [name]: 0 Active Problems ICD-10 Encounter Code Description Active Date MDM Diagnosis [id number] Non-pressure chronic ulcer of other part of right foot with fat layer exposed 04/12/2022 No Yes I87.2 Venous insufficiency (chronic) (peripheral) 04/12/2022 No Yes G60.9 Hereditary and idiopathic neuropathy, unspecified 04/12/2022 No Yes R73.03 Prediabetes 04/12/2022 No Yes Inactive Problems Resolved Problems Electronic Signature(s) Signed: 04/12/2022 2:49:11 PM By: [name] MD FACS Entered By: [name] on 04/12/2022 14:49:11 -------------------------------------------------------------------------------- Progress Note Details Patient Name: Date of Service: [name], RO [facility] 04/12/2022 1:30 PM Medical Record Number: [id number] Patient Account Number: [id number] Date of Birth/Sex: Treating RN: [birth year] (66 y.o. [name] Primary Care Provider: [name] Other Clinician: Referring Provider: Treating Provider/Extender: [name] [name]: 0 Subjective Chief Complaint Information obtained from Patient Patient seen for complaints of Non-Healing Wound. History of Present Illness (HPI) ADMISSION 01/21/2022 This is a 66 year old woman presenting with an ulcer on the right fifth metatarsal head. She says that it has been present for about 6 weeks. She is not certain how it developed, but she does have peripheral neuropathy. She also has lymphedema,  hypertension, metabolic syndrome/prediabetes, but does not carry an actual diagnosis of diabetes nor is she on any medications for this. 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 ABI in clinic today was 0.8. She has been treated in the past for lymphedema, but has been unable to tolerate compression. She does have lymphedema pumps but does not use these regularly. She had been applying Neosporin and new skin to her wound, but it did not really change much. She was referred here by her primary care provider for further evaluation and management. 01/28/2022: 1 week follow-up. Apparently there was some miscommunication with home health and they did not come out to help her with her dressing changes. The patient and her husband have been doing them themselves. The wound actually looks quite good, with decreased dimensions and a very nice healthy surface. 02/05/2022: The home health nurse came out during the week and the patient was happy with the care. The wound is smaller today with a clean base. 02/12/2022: The wound continues to contract and is quite small today. There is a bit of callus buildup, but the wound surface itself is very clean. 02/19/2022: The wound is nearly closed. There is a small amount of callus buildup. No slough or drainage. 02/25/2022: The wound is down to just a sliver.  She continues to build up callus. No concern for infection. 03/04/2022: The wound is closed. READMISSION 04/12/2022 Last week, the patient noticed a spot of blood on her stocking where her wound had been. She noted that the wound was open. She began applying the Prisma silver collagen that we had used during her last admission. She wrapped it up and on Friday when she changed the dressing, she noticed that there was a new wound on the dorsal part of her right foot overlying the fifth metatarsal. Today it is quite red and tender. There is slough on the surface. The wound on  the plantar surface has periwound callus accumulation but otherwise appears fairly clean. Patient History Information obtained from Patient. Allergies celecoxib, ibuprofen, meloxicam, Lipitor, amoxicillin, penicillin, Statins-HMG-CoA Reductase Inhibitors Family History Heart Disease - Father, No family history of Cancer, Diabetes, Hereditary Spherocytosis, Hypertension, Kidney Disease, Lung Disease, Seizures, Stroke, Thyroid Problems, Tuberculosis. Social History Never smoker, Marital Status - Married, Alcohol Use - Never, Drug Use - No History, Caffeine Use - Never. Medical History Hematologic/Lymphatic Patient has history of Lymphedema Cardiovascular Patient has history of Hypertension, Peripheral Venous Disease Musculoskeletal Patient has history of Gout, Rheumatoid Arthritis - ankle Neurologic Patient has history of Neuropathy - Bilateral feet Hospitalization/Surgery History - Hernia repair (2016);Right ankle reconstruction. - insertion of mesh. - pancreatectomy. - toe surgery bilateral. - cholecystectomy. - abdominal hysterectomy. - tonsillectomy. - wrist surgery. Medical A Surgical History Notes nd Constitutional Symptoms (General Health) obesity Hematologic/Lymphatic Gout, vitamin D deficiency Cardiovascular PEriperal venous insufficiency Gastrointestinal Diverticular disease of colon: GERD, fatty liver Endocrine Prediabetes Neurologic Idiopathic peripheral neuropathy Review of Systems (ROS) Eyes Complains or has symptoms of Glasses / Contacts - glasses. Ear/Nose/Mouth/Throat Denies complaints or symptoms of Chronic sinus problems or rhinitis. Respiratory Denies complaints or symptoms of Chronic or frequent coughs, Shortness of Breath. Gastrointestinal Denies complaints or symptoms of Frequent diarrhea, Nausea, Vomiting. Endocrine Denies complaints or symptoms of Heat/cold intolerance. Genitourinary Denies complaints or symptoms of Frequent  urination. Integumentary (Skin) Complains or has symptoms of Wounds. Psychiatric Denies complaints or symptoms of Claustrophobia, Suicidal. Objective Constitutional No acute distress. Vitals Time Taken: 1:44 PM, Height: 65 in, Source: Stated, Weight: 235 lbs, Source: Stated, BMI: 39.1, Temperature: 98 F, Pulse: 60 bpm, Respiratory Rate: 18 breaths/min, Blood Pressure: 117/80 mmHg. Respiratory Normal work of breathing on supplemental oxygen. General Notes: 04/12/2022: On the plantar surface of the right fifth metatarsal head, there is accumulated callus with a narrow slit in it, exposing the fat layer on her foot. On the dorsal surface of her foot at the fifth metatarsal, there is denuding of the skin as well as exposure of the fat layer. The surrounding periwound tissues are red and inflamed-appearing. There is slough accumulation on the wound surface. Integumentary (Hair, Skin) Wound #1 status is Open. Original cause of wound was Gradually Appeared. The date acquired was: 12/26/2021. The wound has been in treatment 11 weeks. The wound is located on the Right Metatarsal head fifth. The wound measures 1cm length x 0.1cm width x 0.1cm depth; 0.079cm^2 area and 0.008cm^3 volume. There is Fat Layer (Subcutaneous Tissue) exposed. There is no tunneling or undermining noted. There is a medium amount of serosanguineous drainage noted. The wound margin is distinct with the outline attached to the wound base. There is large (67-100%) red granulation within the wound bed. There is a small (1-33%) amount of necrotic tissue within the wound bed including Eschar. Wound #2 status is Open. Original cause of wound was Blister. The  date acquired was: 04/09/2022. The wound is located on the Right,Lateral Foot. The wound measures 2.2cm length x 2.1cm width x 0.1cm depth; 3.629cm^2 area and 0.363cm^3 volume. There is Fat Layer (Subcutaneous Tissue) exposed. There is no tunneling or undermining noted. There is a  medium amount of serosanguineous drainage noted. The wound margin is distinct with the outline attached to the wound base. There is small (1-33%) red granulation within the wound bed. There is a large (67-100%) amount of necrotic tissue within the wound bed including Adherent Slough. Assessment Active Problems ICD-10 Non-pressure chronic ulcer of other part of right foot with fat layer exposed Venous insufficiency (chronic) (peripheral) Hereditary and idiopathic neuropathy, unspecified Prediabetes Procedures Wound #1 Pre-procedure diagnosis of Wound #1 is a Lymphedema located on the Right Metatarsal head fifth . There was a Excisional Skin/Subcutaneous Tissue Debridement with a total area of 0.1 sq cm performed by [name], MD. With the following instrument(s): Curette to remove Viable and Non-Viable tissue/material. Material removed includes Callus, Subcutaneous Tissue, and Skin: Epidermis after achieving pain control using Other (benzocaine 20 spray). No specimens were taken. A time out was conducted at 14:25, prior to the start of the procedure. A Minimum amount of bleeding was controlled with Pressure. The procedure was tolerated well with a pain level of 0 throughout and a pain level of 0 following the procedure. Post Debridement Measurements: 1cm length x 0.1cm width x 0.1cm depth; 0.008cm^3 volume. Character of Wound/Ulcer Post Debridement is improved. Post procedure Diagnosis Wound #1: Same as Pre-Procedure Wound #2 Pre-procedure diagnosis of Wound #2 is a Venous Leg Ulcer located on the Right,Lateral Foot .Severity of Tissue Pre Debridement is: Fat layer exposed. There was a Excisional Skin/Subcutaneous Tissue Debridement with a total area of 4.62 sq cm performed by [name], MD. With the following instrument(s): Curette to remove Viable and Non-Viable tissue/material. Material removed includes Subcutaneous Tissue, Slough, and Skin: Epidermis after achieving pain  control using Other (benzocaine 20 spray). No specimens were taken. A time out was conducted at 14:25, prior to the start of the procedure. A Minimum amount of bleeding was controlled with Pressure. The procedure was tolerated well with a pain level of 0 throughout and a pain level of 0 following the procedure. Post Debridement Measurements: 2.2cm length x 2.1cm width x 0.1cm depth; 0.363cm^3 volume. Character of Wound/Ulcer Post Debridement is improved. Severity of Tissue Post Debridement is: Fat layer exposed. Post procedure Diagnosis Wound #2: Same as Pre-Procedure Plan Follow-up Appointments: Return Appointment in 1 week. - Dr. Celine Ahr - Room 2 - 6/19 at 1:30 PM Bathing/ Shower/ Hygiene: May shower with protection but do not get wound dressing(s) wet. - Please use cast protector (Can purchase cast protector from local pharmacy, such as [facility] or [facility]) Edema Control - Lymphedema / SCD / Other: Elevate legs to the level of the heart or above for 30 minutes daily and/or when sitting, a frequency of: Avoid standing for long periods of time. Patient to wear own compression stockings every day. - L left Exercise regularly Off-Loading: Other: - Offloading shoe on Right foot The following medication(s) was prescribed: gentamicin topical 0.1 % cream apply to wound surface with dressing changes as instructed starting 04/12/2022 WOUND #1: - Metatarsal head fifth Wound Laterality: Right Cleanser: Soap and Water Every Other Day/10 Days Discharge Instructions: May shower and wash wound with dial antibacterial soap and water prior to dressing change. Cleanser: Wound Cleanser (Generic) Every Other Day/10 Days Discharge Instructions: Cleanse the wound with wound cleanser  prior to applying a clean dressing using gauze sponges, not tissue or cotton balls. Cleanser: [place] Ancillary Kit - 15 Day Supply (DME) (Generic) Every Other Day/10 Days Discharge Instructions: Use supplies as instructed; Kit contains:  (15) Saline Bullets; (15) 3x3 Gauze; 15 pr Gloves Prim Dressing: KerraCel Ag Gelling Fiber Dressing, 2x2 in (silver alginate) (DME) (Generic) Every Other Day/10 Days ary Discharge Instructions: Apply silver alginate to wound bed as instructed Secondary Dressing: Optifoam Non-Adhesive Dressing, 4x4 in (DME) (Generic) Every Other Day/10 Days Discharge Instructions: Apply over primary dressing as directed. Secondary Dressing: Woven Gauze Sponges 2x2 in (DME) (Generic) Every Other Day/10 Days Discharge Instructions: Apply over primary dressing as directed. Secured With: Elastic Bandage 4 inch (ACE bandage) (DME) (Generic) Every Other Day/10 Days Discharge Instructions: Secure with ACE bandage as directed. Secured With: [employer], 4.5x3.1 (in/yd) (DME) (Generic) Every Other Day/10 Days Discharge Instructions: Secure with Kerlix as directed. Secured With: 61M Medipore H Soft Cloth Surgical T ape, 4 x 10 (in/yd) (DME) (Generic) Every Other Day/10 Days Discharge Instructions: Secure with tape as directed. WOUND #2: - Foot Wound Laterality: Right, Lateral Cleanser: Soap and Water Every Other Day/10 Days Discharge Instructions: May shower and wash wound with dial antibacterial soap and water prior to dressing change. Cleanser: Wound Cleanser Every Other Day/10 Days Discharge Instructions: Cleanse the wound with wound cleanser prior to applying a clean dressing using gauze sponges, not tissue or cotton balls. Topical: Gentamicin Every Other Day/10 Days Discharge Instructions: As directed by physician Prim Dressing: KerraCel Ag Gelling Fiber Dressing, 2x2 in (silver alginate) (DME) (Generic) Every Other Day/10 Days ary Discharge Instructions: Apply silver alginate to wound bed as instructed Secondary Dressing: Woven Gauze Sponge, Non-Sterile 4x4 in (DME) (Generic) Every Other Day/10 Days Discharge Instructions: Apply over primary dressing as directed. Secured With: Elastic Bandage 4 inch (ACE  bandage) (DME) (Generic) Every Other Day/10 Days Discharge Instructions: Secure with ACE bandage as directed. Secured With: [employer], 4.5x3.1 (in/yd) (DME) (Generic) Every Other Day/10 Days Discharge Instructions: Secure with Kerlix as directed. Secured With: 61M Medipore H Soft Cloth Surgical T ape, 4 x 10 (in/yd) (DME) (Generic) Every Other Day/10 Days Discharge Instructions: Secure with tape as directed. 04/12/2022: On the plantar surface of the right fifth metatarsal head, there is accumulated callus with a narrow slit in it, exposing the fat layer on her foot. On the dorsal surface of her foot at the fifth metatarsal, there is denuding of the skin as well as exposure of the fat layer. The surrounding periwound tissues are red and inflamed-appearing. There is slough accumulation on the wound surface. I used a curette to debride slough, callus, and subcutaneous tissue from the plantar wound and slough and nonviable skin from the dorsal foot wound. I also took a PCR culture. For now, we will apply topical gentamicin to the wound under silver alginate with Kerlix and Ace bandaging. The patient was instructed to be sure that her foot is completely dry before applying her dressings. In my opinion, it looks like her skin became macerated and then from friction, the new wound was created. Once I have PCR culture data back, we will give consideration to other antibiotic therapy. Follow-up in 1 week. Electronic Signature(s) Signed: 04/12/2022 3:00:09 PM By: [name] MD FACS Entered By: [name] on 04/12/2022 15:00:09 -------------------------------------------------------------------------------- HxROS Details Patient Name: Date of Service: [name], [place] 04/12/2022 1:30 PM Medical Record Number: [id number] Patient Account Number: [id number] Date of Birth/Sex: Treating RN: 1955/11/21 (66  y.o. [name] Primary Care Provider: [name] Other  Clinician: Referring Provider: Treating Provider/Extender: [name] [name]: 0 Information Obtained From Patient Eyes Complaints and Symptoms: Positive for: Glasses / Contacts - glasses Ear/Nose/Mouth/Throat Complaints and Symptoms: Negative for: Chronic sinus problems or rhinitis Respiratory Complaints and Symptoms: Negative for: Chronic or frequent coughs; Shortness of Breath Gastrointestinal Complaints and Symptoms: Negative for: Frequent diarrhea; Nausea; Vomiting Medical History: Past Medical History Notes: Diverticular disease of colon: GERD, fatty liver Endocrine Complaints and Symptoms: Negative for: Heat/cold intolerance Medical History: Past Medical History Notes: Prediabetes Genitourinary Complaints and Symptoms: Negative for: Frequent urination Integumentary (Skin) Complaints and Symptoms: Positive for: Wounds Psychiatric Complaints and Symptoms: Negative for: Claustrophobia; Suicidal Constitutional Symptoms (General Health) Medical History: Past Medical History Notes: obesity Hematologic/Lymphatic Medical History: Positive for: Lymphedema Past Medical History Notes: Gout, vitamin D deficiency Cardiovascular Medical History: Positive for: Hypertension; Peripheral Venous Disease Past Medical History Notes: PEriperal venous insufficiency Immunological Musculoskeletal Medical History: Positive for: Gout; Rheumatoid Arthritis - ankle Neurologic Medical History: Positive for: Neuropathy - Bilateral feet Past Medical History Notes: Idiopathic peripheral neuropathy Oncologic Immunizations Pneumococcal Vaccine: Received Pneumococcal Vaccination: Yes Received Pneumococcal Vaccination On or After 60th Birthday: Yes Implantable Devices None Hospitalization / Surgery History Type of Hospitalization/Surgery Hernia repair (2016);Right ankle reconstruction insertion of mesh pancreatectomy toe surgery  bilateral cholecystectomy abdominal hysterectomy tonsillectomy wrist surgery Family and Social History Cancer: No; Diabetes: No; Heart Disease: Yes - Father; Hereditary Spherocytosis: No; Hypertension: No; Kidney Disease: No; Lung Disease: No; Seizures: No; Stroke: No; Thyroid Problems: No; Tuberculosis: No; Never smoker; Marital Status - Married; Alcohol Use: Never; Drug Use: No History; Caffeine Use: Never; Financial Concerns: No; Food, Clothing or Shelter Needs: No; Support System Lacking: No; Transportation Concerns: No Electronic Signature(s) Signed: 04/12/2022 3:22:33 PM By: [name] MD FACS Signed: 04/12/2022 5:17:06 PM By: Adline Peals Entered By: Adline Peals on 04/12/2022 14:02:11 -------------------------------------------------------------------------------- SuperBill Details Patient Name: Date of Service: [name], [place] 04/12/2022 Medical Record Number: [id number] Patient Account Number: [id number] Date of Birth/Sex: Treating RN: 1956-03-30 (66 y.o. [name] Primary Care Provider: [name] Other Clinician: Referring Provider: Treating Provider/Extender: [name] [name]: 0 Diagnosis Coding ICD-10 Codes Code Description [phone] Non-pressure chronic ulcer of other part of right foot with fat layer exposed I87.2 Venous insufficiency (chronic) (peripheral) G60.9 Hereditary and idiopathic neuropathy, unspecified R73.03 Prediabetes Facility Procedures CPT4 Code: [id number] Description: [facility] VISIT-LEV 3 EST PT Modifier: 25 Quantity: 1 CPT4 Code: [id number] Description: [place] - DEB SUBQ TISSUE 20 SQ CM/< ICD-10 Diagnosis Description L97.512 Non-pressure chronic ulcer of other part of right foot with fat layer exposed Modifier: Quantity: 1 Physician Procedures : CPT4 Code Description Modifier [id number] - WC PHYS LEVEL 4 - EST PT 25 ICD-10 Diagnosis Description L97.512  Non-pressure chronic ulcer of other part of right foot with fat layer exposed I87.2 Venous insufficiency (chronic) (peripheral) G60.9  Hereditary and idiopathic neuropathy, unspecified R73.03 Prediabetes Quantity: 1 : [id number] - WC PHYS SUBQ TISS 20 SQ CM ICD-10 Diagnosis Description L97.512 Non-pressure chronic ulcer of other part of right foot with fat layer exposed Quantity: 1 Electronic Signature(s) Signed: 04/12/2022 5:17:06 PM By: Adline Peals Signed: 04/13/2022 7:33:34 AM By: [name] MD FACS Previous Signature: 04/12/2022 3:00:26 PM Version By: [name] MD FACS Entered By: Adline Peals on 04/12/2022 16:53:10

## 2022-04-15 ENCOUNTER — Encounter (HOSPITAL_BASED_OUTPATIENT_CLINIC_OR_DEPARTMENT_OTHER): Payer: Medicare Other | Admitting: General Surgery

## 2022-04-19 ENCOUNTER — Encounter (HOSPITAL_BASED_OUTPATIENT_CLINIC_OR_DEPARTMENT_OTHER): Payer: Medicare Other | Admitting: General Surgery

## 2022-04-19 DIAGNOSIS — L97512 Non-pressure chronic ulcer of other part of right foot with fat layer exposed: Secondary | ICD-10-CM | POA: Diagnosis not present

## 2022-04-19 NOTE — Progress Notes (Signed)
JUSTENE, JENSEN ([id number]) Visit Report for 04/19/2022 Arrival Information Details Patient Name: Date of Service: Latanya Maudlin ND, [facility] 04/19/2022 1:30 PM Medical Record Number: [id number] Patient Account Number: [id number] Date of Birth/Sex: Treating RN: 1955/11/09 (66 y.o. [name] Primary Care Princella Jaskiewicz: [name] Other Clinician: Referring Kaleb Sek: Treating Jency Schnieders/Extender: [name] [name]: 1 Visit Information History Since Last Visit Added or deleted any medications: No Patient Arrived: [name] Any new allergies or adverse reactions: No Arrival Time: 13:51 Had a fall or experienced change in No Accompanied By: self activities of daily living that may affect Transfer Assistance: None risk of falls: Patient Identification Verified: Yes Signs or symptoms of abuse/neglect since last visito No Secondary Verification Process Completed: Yes Hospitalized since last visit: No Patient Requires Transmission-Based Precautions: No Implantable device outside of the clinic excluding No Patient Has Alerts: Yes cellular tissue based products placed in the center Patient Alerts: Patient on Blood Thinner since last visit: Has Dressing in Place as Prescribed: Yes Pain Present Now: Yes Electronic Signature(s) Signed: 04/19/2022 5:25:20 PM By: Adline Peals Entered By: Adline Peals on 04/19/2022 13:54:29 -------------------------------------------------------------------------------- Encounter Discharge Information Details Patient Name: Date of Service: [name], RO [facility] 04/19/2022 1:30 PM Medical Record Number: [id number] Patient Account Number: [id number] Date of Birth/Sex: Treating RN: 1956/10/16 (66 y.o. [name] Primary Care Bridie Colquhoun: [name] Other Clinician: Referring Bartholomew Ramesh: Treating Taziah Difatta/Extender: [name] [name]: 1 Encounter Discharge Information  Items Discharge Condition: Stable Ambulatory Status: Cane Discharge Destination: Home Transportation: Private Auto Accompanied By: self Schedule Follow-up Appointment: Yes Clinical Summary of Care: Patient Declined Electronic Signature(s) Signed: 04/19/2022 5:25:20 PM By: Adline Peals Entered By: Adline Peals on 04/19/2022 17:21:54 -------------------------------------------------------------------------------- Lower Extremity Assessment Details Patient Name: Date of Service: [name], RO [facility] 04/19/2022 1:30 PM Medical Record Number: [id number] Patient Account Number: [id number] Date of Birth/Sex: Treating RN: Sep 11, 1956 (66 y.o. [name] Primary Care Revia Nghiem: [name] Other Clinician: Referring Gay Moncivais: Treating Eliz Nigg/Extender: [name] [name]: 1 Edema Assessment Assessed: [Left: No] [Right: No] E[Left: dema] [Right: :] Calf Left: Right: Point of Measurement: From Medial Instep 44.4 cm Ankle Left: Right: Point of Measurement: From Medial Instep 22 cm Vascular Assessment Pulses: Dorsalis Pedis Palpable: [Right:Yes] Electronic Signature(s) Signed: 04/19/2022 5:25:20 PM By: Adline Peals Entered By: Adline Peals on 04/19/2022 13:59:23 -------------------------------------------------------------------------------- Multi Wound Chart Details Patient Name: Date of Service: [name], RO [facility] 04/19/2022 1:30 PM Medical Record Number: [id number] Patient Account Number: [id number] Date of Birth/Sex: Treating RN: 1956-07-28 (67 y.o. [name] Primary Care Emy Angevine: [name] Other Clinician: Referring Everlina Gotts: Treating Cheron Coryell/Extender: [name] [name]: 1 Vital Signs Height(in): 65 Pulse(bpm): 5 Weight(lbs): 235 Blood Pressure(mmHg): 115/76 Body Mass Index(BMI): 39.1 Temperature(F): 97.7 Respiratory Rate(breaths/min):  18 Photos: [1:Right Metatarsal head fifth] [2:Right, Lateral Foot] [N/A:N/A N/A] Wound Location: [1:Gradually Appeared] [2:Blister] [N/A:N/A] Wounding Event: [1:Lymphedema] [2:Venous Leg Ulcer] [N/A:N/A] Primary Etiology: [1:Lymphedema, Hypertension,] [2:Lymphedema, Hypertension,] [N/A:N/A] Comorbid History: [1:Peripheral Venous Disease, Gout, Rheumatoid Arthritis, Neuropathy 12/26/2021] [2:Peripheral Venous Disease, Gout, Rheumatoid Arthritis, Neuropathy 04/09/2022] [N/A:N/A] Date Acquired: [1:12] [2:1] [N/A:N/A] Weeks of Treatment: [1:Open] [2:Open] [N/A:N/A] Wound Status: [1:No] [2:No] [N/A:N/A] Wound Recurrence: [1:0.5x0.4x0.1] [2:0.6x2x0.1] [N/A:N/A] Measurements L x W x D (cm) [1:0.157] [2:0.942] [N/A:N/A] A (cm) : rea [1:0.016] [2:0.094] [N/A:N/A] Volume (cm) : [1:16.50%] [2:74.00%] [N/A:N/A] % Reduction in Area: [1:78.70%] [2:74.10%] [N/A:N/A] % Reduction in Volume: [1:Full Thickness Without Exposed] [2:Full Thickness Without  Exposed] [N/A:N/A] Classification: [1:Support Structures Medium] [2:Support Structures Medium] [N/A:N/A] Exudate Amount: [1:Serosanguineous] [2:Serosanguineous] [N/A:N/A] Exudate Type: [1:red, brown] [2:red, brown] [N/A:N/A] Exudate Color: [1:Distinct, outline attached] [2:Distinct, outline attached] [N/A:N/A] Wound Margin: [1:Large (67-100%)] [2:Large (67-100%)] [N/A:N/A] Granulation Amount: [1:Red] [2:Red] [N/A:N/A] Granulation Quality: [1:Small (1-33%)] [2:Small (1-33%)] [N/A:N/A] Necrotic Amount: [1:Fat Layer (Subcutaneous Tissue): Yes Fat Layer (Subcutaneous Tissue): Yes N/A] Exposed Structures: [1:Fascia: No Tendon: No Muscle: No Joint: No Bone: No Small (1-33%)] [2:Fascia: No Tendon: No Muscle: No Joint: No Bone: No Small (1-33%)] [N/A:N/A] Epithelialization: [1:T Contact Cast otal] [2:N/A] [N/A:N/A] Treatment Notes Electronic Signature(s) Signed: 04/19/2022 2:34:55 PM By: [name] MD FACS Signed: 04/19/2022 5:25:20 PM By: Adline Peals Entered By: [name] on 04/19/2022 14:34:55 -------------------------------------------------------------------------------- Multi-Disciplinary Care Plan Details Patient Name: Date of Service: [name], RO [facility] 04/19/2022 1:30 PM Medical Record Number: [id number] Patient Account Number: [id number] Date of Birth/Sex: Treating RN: April 12, 1956 (66 y.o. [name] Primary Care Briar Sword: [name] Other Clinician: Referring Macauley Mossberg: Treating Tomiko Schoon/Extender: [name] [name]: 1 Active Inactive Wound/Skin Impairment Nursing Diagnoses: Impaired tissue integrity Knowledge deficit related to ulceration/compromised skin integrity Goals: Patient/caregiver will verbalize understanding of skin care regimen Date Initiated: 04/12/2022 Target Resolution Date: 05/07/2022 Goal Status: Active Ulcer/skin breakdown will have a volume reduction of 30% by week 4 Date Initiated: 04/12/2022 Target Resolution Date: 05/07/2022 Goal Status: Active Interventions: Assess patient/caregiver ability to obtain necessary supplies Assess patient/caregiver ability to perform ulcer/skin care regimen upon admission and as needed Assess ulceration(s) every visit Treatment Activities: Skin care regimen initiated : 04/12/2022 Topical wound management initiated : 04/12/2022 Notes: Electronic Signature(s) Signed: 04/19/2022 5:25:20 PM By: Adline Peals Entered By: Adline Peals on 04/19/2022 14:01:58 -------------------------------------------------------------------------------- Pain Assessment Details Patient Name: Date of Service: [name], RO [facility] 04/19/2022 1:30 PM Medical Record Number: [id number] Patient Account Number: [id number] Date of Birth/Sex: Treating RN: 18-Jan-1956 (66 y.o. [name] Primary Care Gladine Plude: [name] Other Clinician: Referring Allissa Albright: Treating Bitania Shankland/Extender: [name] [name]: 1 Active Problems Location of Pain Severity and Description of Pain Patient Has Paino Yes Site Locations Pain Location: Pain in Ulcers Duration of the Pain. Constant / Intermittento Intermittent Rate the pain. Current Pain Level: 4 Pain Management and Medication Current Pain Management: Electronic Signature(s) Signed: 04/19/2022 5:25:20 PM By: Adline Peals Entered By: Adline Peals on 04/19/2022 13:56:36 -------------------------------------------------------------------------------- Patient/Caregiver Education Details Patient Name: Date of Service: Modena Morrow, RO [facility] 6/19/2023andnbsp1:30 PM Medical Record Number: [id number] Patient Account Number: [id number] Date of Birth/Gender: Treating RN: [birth year] (66 y.o. [name] Primary Care Physician: [name] Other Clinician: Referring Physician: Treating Physician/Extender: [name] [name]: 1 Education Assessment Education Provided To: Patient Education Topics Provided Wound/Skin Impairment: Methods: Explain/Verbal Responses: Reinforcements needed, State content correctly Electronic Signature(s) Signed: 04/19/2022 5:25:20 PM By: Adline Peals Entered By: Adline Peals on 04/19/2022 14:02:08 -------------------------------------------------------------------------------- Wound Assessment Details Patient Name: Date of Service: [name], RO [facility] 04/19/2022 1:30 PM Medical Record Number: [id number] Patient Account Number: [id number] Date of Birth/Sex: Treating RN: July 03, 1956 (66 y.o. [name] Primary Care Teasha Murrillo: [name] Other Clinician: Referring Loudon Krakow: Treating Laurelai Lepp/Extender: [name] [name]: 1 Wound Status Wound Number: 1 Primary Lymphedema Etiology: Wound Location: Right Metatarsal head fifth Wound Open Wounding Event: Gradually  Appeared Status: Date Acquired: 12/26/2021 Comorbid Lymphedema, Hypertension, Peripheral Venous Disease, Gout, Weeks Of Treatment: 12 History: Rheumatoid Arthritis, Neuropathy Clustered Wound: No  Photos Wound Measurements Length: (cm) 0.5 Width: (cm) 0.4 Depth: (cm) 0.1 Area: (cm) 0.157 Volume: (cm) 0.016 % Reduction in Area: 16.5% % Reduction in Volume: 78.7% Epithelialization: Small (1-33%) Tunneling: No Undermining: No Wound Description Classification: Full Thickness Without Exposed Support Structures Wound Margin: Distinct, outline attached Exudate Amount: Medium Exudate Type: Serosanguineous Exudate Color: red, brown Foul Odor After Cleansing: No Slough/Fibrino Yes Wound Bed Granulation Amount: Large (67-100%) Exposed Structure Granulation Quality: Red Fascia Exposed: No Necrotic Amount: Small (1-33%) Fat Layer (Subcutaneous Tissue) Exposed: Yes Necrotic Quality: Adherent Slough Tendon Exposed: No Muscle Exposed: No Joint Exposed: No Bone Exposed: No Treatment Notes Wound #1 (Metatarsal head fifth) Wound Laterality: Right Cleanser Soap and Water Discharge Instruction: May shower and wash wound with dial antibacterial soap and water prior to dressing change. Wound Cleanser Discharge Instruction: Cleanse the wound with wound cleanser prior to applying a clean dressing using gauze sponges, not tissue or cotton balls. Byram Ancillary Kit - 15 Day Supply Discharge Instruction: Use supplies as instructed; Kit contains: (15) Saline Bullets; (15) 3x3 Gauze; 15 pr Gloves Peri-Wound Care Topical Gentamicin Discharge Instruction: apply thin layer to wound bed Primary Dressing KerraCel Ag Gelling Fiber Dressing, 2x2 in (silver alginate) Discharge Instruction: Apply silver alginate to wound bed as instructed Secondary Dressing Optifoam Non-Adhesive Dressing, 4x4 in Discharge Instruction: Apply over primary dressing cut to make foam donut Woven Gauze Sponges 2x2  in Discharge Instruction: Apply over primary dressing as directed. Secured With Elastic Bandage 4 inch (ACE bandage) Discharge Instruction: Secure with ACE bandage as directed. Kerlix Roll Sterile, 4.5x3.1 (in/yd) Discharge Instruction: Secure with Kerlix as directed. 80M Medipore H Soft Cloth Surgical T ape, 4 x 10 (in/yd) Discharge Instruction: Secure with tape as directed. Compression Wrap Compression Stockings Add-Ons Electronic Signature(s) Signed: 04/19/2022 5:25:20 PM By: Adline Peals Entered By: Adline Peals on 04/19/2022 14:04:22 -------------------------------------------------------------------------------- Wound Assessment Details Patient Name: Date of Service: [name], [facility] 04/19/2022 1:30 PM Medical Record Number: [id number] Patient Account Number: [id number] Date of Birth/Sex: Treating RN: [birth year] (66 y.o. [name] Primary Care Torrence Branagan: [name] Other Clinician: Referring Aryeh Butterfield: Treating Cheri Ayotte/Extender: [name] [name]: 1 Wound Status Wound Number: 2 Primary Venous Leg Ulcer Etiology: Etiology: Wound Location: Right, Lateral Foot Wound Open Wounding Event: Blister Status: Date Acquired: 04/09/2022 Comorbid Lymphedema, Hypertension, Peripheral Venous Disease, Gout, Weeks Of Treatment: 1 History: Rheumatoid Arthritis, Neuropathy Clustered Wound: No Photos Wound Measurements Length: (cm) 0.6 Width: (cm) 2 Depth: (cm) 0.1 Area: (cm) 0.942 Volume: (cm) 0.094 % Reduction in Area: 74% % Reduction in Volume: 74.1% Epithelialization: Small (1-33%) Tunneling: No Undermining: No Wound Description Classification: Full Thickness Without Exposed Support Structures Wound Margin: Distinct, outline attached Exudate Amount: Medium Exudate Type: Serosanguineous Exudate Color: red, brown Foul Odor After Cleansing: No Slough/Fibrino Yes Wound Bed Granulation Amount: Large  (67-100%) Exposed Structure Granulation Quality: Red Fascia Exposed: No Necrotic Amount: Small (1-33%) Fat Layer (Subcutaneous Tissue) Exposed: Yes Necrotic Quality: Adherent Slough Tendon Exposed: No Muscle Exposed: No Joint Exposed: No Bone Exposed: No Treatment Notes Wound #2 (Foot) Wound Laterality: Right, Lateral Cleanser Soap and Water Discharge Instruction: May shower and wash wound with dial antibacterial soap and water prior to dressing change. Wound Cleanser Discharge Instruction: Cleanse the wound with wound cleanser prior to applying a clean dressing using gauze sponges, not tissue or cotton balls. Peri-Wound Care Topical Gentamicin Discharge Instruction: thin layer to wound bed Primary Dressing KerraCel Ag Gelling Fiber Dressing, 2x2 in (silver alginate)  Discharge Instruction: Apply silver alginate to wound bed as instructed Secondary Dressing Woven Gauze Sponge, Non-Sterile 4x4 in Discharge Instruction: Apply over primary dressing as directed. Secured With Elastic Bandage 4 inch (ACE bandage) Discharge Instruction: Secure with ACE bandage as directed. Kerlix Roll Sterile, 4.5x3.1 (in/yd) Discharge Instruction: Secure with Kerlix as directed. 33M Medipore H Soft Cloth Surgical T ape, 4 x 10 (in/yd) Discharge Instruction: Secure with tape as directed. Compression Wrap Compression Stockings Add-Ons Electronic Signature(s) Signed: 04/19/2022 5:25:20 PM By: Adline Peals Entered By: Adline Peals on 04/19/2022 14:04:48 -------------------------------------------------------------------------------- Vitals Details Patient Name: Date of Service: [name], RO [facility] 04/19/2022 1:30 PM Medical Record Number: [id number] Patient Account Number: [id number] Date of Birth/Sex: Treating RN: Apr 29, 1956 (66 y.o. [name] Primary Care Elianys Conry: [name] Other Clinician: Referring Sedona Wenk: Treating Montia Haslip/Extender: [name] [name]: 1 Vital Signs Time Taken: 13:55 Temperature (F): 97.7 Height (in): 65 Pulse (bpm): 61 Weight (lbs): 235 Respiratory Rate (breaths/min): 18 Body Mass Index (BMI): 39.1 Blood Pressure (mmHg): 115/76 Reference Range: 80 - 120 mg / dl Electronic Signature(s) Signed: 04/19/2022 5:25:20 PM By: Adline Peals Entered By: Adline Peals on 04/19/2022 02:71:42

## 2022-04-21 NOTE — Progress Notes (Signed)
[place], DOST ([id number]) Visit Report for 04/19/2022 Chief Complaint Document Details Patient Name: Date of Service: [name], Carolyn Stare [facility] 04/19/2022 1:30 PM Medical Record Number: [id number] Patient Account Number: [id number] Date of Birth/Sex: Treating RN: 09-02-1956 (67 y.o. [name] Primary Care Provider: [name] Other Clinician: Referring Provider: Treating Provider/Extender: [name] [name]: 1 Information Obtained from: Patient Chief Complaint Patient seen for complaints of Non-Healing Wound. Electronic Signature(s) Signed: 04/19/2022 2:35:03 PM By: [name] MD FACS Entered By: [name] on 04/19/2022 14:35:02 -------------------------------------------------------------------------------- HPI Details Patient Name: Date of Service: [name], RO [facility] 04/19/2022 1:30 PM Medical Record Number: [id number] Patient Account Number: [id number] Date of Birth/Sex: Treating RN: Jul 14, 1956 (66 y.o. [name] Primary Care Provider: [name] Other Clinician: Referring Provider: Treating Provider/Extender: [name] [name]: 1 History of Present Illness HPI Description: ADMISSION 01/21/2022 This is a 66 year old woman presenting with an ulcer on the right fifth metatarsal head. She says that it has been present for about 6 weeks. She is not certain how it developed, but she does have peripheral neuropathy. She also has lymphedema, hypertension, metabolic syndrome/prediabetes, but does not carry an actual diagnosis of diabetes nor is she on any medications for this. 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 ABI in clinic today was 0.8. She has been treated in the past for lymphedema, but has been unable to tolerate compression. She does have lymphedema pumps but does not use these regularly. She had been applying Neosporin and  new skin to her wound, but it did not really change much. She was referred here by her primary care provider for further evaluation and management. 01/28/2022: 1 week follow-up. Apparently there was some miscommunication with home health and they did not come out to help her with her dressing changes. The patient and her husband have been doing them themselves. The wound actually looks quite good, with decreased dimensions and a very nice healthy surface. 02/05/2022: The home health nurse came out during the week and the patient was happy with the care. The wound is smaller today with a clean base. 02/12/2022: The wound continues to contract and is quite small today. There is a bit of callus buildup, but the wound surface itself is very clean. 02/19/2022: The wound is nearly closed. There is a small amount of callus buildup. No slough or drainage. 02/25/2022: The wound is down to just a sliver. She continues to build up callus. No concern for infection. 03/04/2022: The wound is closed. READMISSION 04/12/2022 Last week, the patient noticed a spot of blood on her stocking where her wound had been. She noted that the wound was open. She began applying the Prisma silver collagen that we had used during her last admission. She wrapped it up and on Friday when she changed the dressing, she noticed that there was a new wound on the dorsal part of her right foot overlying the fifth metatarsal. Today it is quite red and tender. There is slough on the surface. The wound on the plantar surface has periwound callus accumulation but otherwise appears fairly clean. 04/19/2022: The culture that we took last week somehow has been lost and we have no results. The patient has been applying topical gentamicin and silver alginate to her wound, however and both of them looks smaller. The redness and tenderness has improved significantly. Her foot remains macerated in appearance, however. She had some bleeding from  the plantar  wound. On inspection, it looks like secondary to moisture, the surrounding skin and callus have lifted. When I probed with a thin cotton applicator, this created some minor bleeding and I think this is likely what happened. The tissue is just quite friable. Electronic Signature(s) Signed: 04/19/2022 2:37:03 PM By: [name] MD FACS Entered By: [name] on 04/19/2022 14:37:02 -------------------------------------------------------------------------------- Physical Exam Details Patient Name: Date of Service: [name], [place] 04/19/2022 1:30 PM Medical Record Number: [id number] Patient Account Number: [id number] Date of Birth/Sex: Treating RN: 1956/07/05 (66 y.o. [name] Primary Care Provider: [name] Other Clinician: Referring Provider: Treating Provider/Extender: [name] [name]: 1 Constitutional . . . . No acute distress.Marland Kitchen Respiratory Normal work of breathing on room air.. Notes 04/19/2022: Both wounds look better this week. The redness and tenderness has improved significantly. Her foot remains macerated in appearance, however. She had some bleeding from the plantar wound. On inspection, it looks like secondary to moisture, the surrounding skin and callus have lifted. The tissue is just quite friable. Electronic Signature(s) Signed: 04/19/2022 2:38:24 PM By: [name] MD FACS Entered By: [name] on 04/19/2022 14:38:24 -------------------------------------------------------------------------------- Physician Orders Details Patient Name: Date of Service: [name], [place] 04/19/2022 1:30 PM Medical Record Number: [id number] Patient Account Number: [id number] Date of Birth/Sex: Treating RN: [birth year] (66 y.o. [name], [name] Primary Care Provider: [name] Other Clinician: Referring Provider: Treating Provider/Extender: [name] [name]: 1 Verbal /  Phone Orders: No Diagnosis Coding ICD-10 Coding Code Description [phone] Non-pressure chronic ulcer of other part of right foot with fat layer exposed I87.2 Venous insufficiency (chronic) (peripheral) G60.9 Hereditary and idiopathic neuropathy, unspecified R73.03 Prediabetes Follow-up Appointments ppointment in 1 week. - Dr. Celine Ahr - 6/26 at 3:30pm Return A ppointment in: - Thurs 6/22 at 2:45pm Return A Bathing/ Shower/ Hygiene May shower with protection but do not get wound dressing(s) wet. - Please use cast protector (Can purchase cast protector from local pharmacy, such as [facility] or [facility]) Edema Control - Lymphedema / SCD / Other Elevate legs to the level of the heart or above for 30 minutes daily and/or when sitting, a frequency of: Avoid standing for long periods of time. Patient to wear own compression stockings every day. - L left Exercise regularly Off-Loading Wound #1 Right Metatarsal head fifth Total Contact Cast to Right Lower Extremity Wound Treatment Wound #1 - Metatarsal head fifth Wound Laterality: Right Cleanser: Soap and Water 1 x Per Week/10 Days Discharge Instructions: May shower and wash wound with dial antibacterial soap and water prior to dressing change. Cleanser: Wound Cleanser (Generic) 1 x Per Week/10 Days Discharge Instructions: Cleanse the wound with wound cleanser prior to applying a clean dressing using gauze sponges, not tissue or cotton balls. Cleanser: Byram Ancillary Kit - 15 Day Supply (Generic) 1 x Per Week/10 Days Discharge Instructions: Use supplies as instructed; Kit contains: (15) Saline Bullets; (15) 3x3 Gauze; 15 pr Gloves Topical: Gentamicin 1 x Per Week/10 Days Discharge Instructions: apply thin layer to wound bed Prim Dressing: KerraCel Ag Gelling Fiber Dressing, 2x2 in (silver alginate) (Generic) 1 x Per Week/10 Days ary Discharge Instructions: Apply silver alginate to wound bed as instructed Secondary Dressing: Optifoam Non-Adhesive  Dressing, 4x4 in (Generic) 1 x Per Week/10 Days Discharge Instructions: Apply over primary dressing cut to make foam donut Secondary Dressing: Woven Gauze Sponges 2x2 in (Generic) 1 x Per Week/10 Days Discharge Instructions: Apply  over primary dressing as directed. Secured With: Elastic Bandage 4 inch (ACE bandage) (Generic) 1 x Per Week/10 Days Discharge Instructions: Secure with ACE bandage as directed. Secured With: [employer], 4.5x3.1 (in/yd) (Generic) 1 x Per Week/10 Days Discharge Instructions: Secure with Kerlix as directed. Secured With: 9M Medipore H Soft Cloth Surgical T ape, 4 x 10 (in/yd) (Generic) 1 x Per Week/10 Days Discharge Instructions: Secure with tape as directed. Wound #2 - Foot Wound Laterality: Right, Lateral Cleanser: Soap and Water 1 x Per Week/10 Days Discharge Instructions: May shower and wash wound with dial antibacterial soap and water prior to dressing change. Cleanser: Wound Cleanser 1 x Per Week/10 Days Discharge Instructions: Cleanse the wound with wound cleanser prior to applying a clean dressing using gauze sponges, not tissue or cotton balls. Topical: Gentamicin 1 x Per Week/10 Days Discharge Instructions: thin layer to wound bed Prim Dressing: KerraCel Ag Gelling Fiber Dressing, 2x2 in (silver alginate) (Generic) 1 x Per Week/10 Days ary Discharge Instructions: Apply silver alginate to wound bed as instructed Secondary Dressing: Woven Gauze Sponge, Non-Sterile 4x4 in (Generic) 1 x Per Week/10 Days Discharge Instructions: Apply over primary dressing as directed. Secured With: Elastic Bandage 4 inch (ACE bandage) (Generic) 1 x Per Week/10 Days Discharge Instructions: Secure with ACE bandage as directed. Secured With: [employer], 4.5x3.1 (in/yd) (Generic) 1 x Per Week/10 Days Discharge Instructions: Secure with Kerlix as directed. Secured With: 9M Medipore H Soft Cloth Surgical T ape, 4 x 10 (in/yd) (Generic) 1 x Per Week/10  Days Discharge Instructions: Secure with tape as directed. Patient Medications llergies: celecoxib, ibuprofen, meloxicam, Lipitor, amoxicillin, penicillin, Statins-HMG-CoA Reductase Inhibitors A Notifications Medication Indication Start End prior to debridement 04/19/2022 benzocaine DOSE topical 20 % aerosol - aerosol topical Electronic Signature(s) Signed: 04/19/2022 5:02:10 PM By: [name] MD FACS Signed: 04/19/2022 5:28:34 PM By: Dellie Catholic RN Entered By: Dellie Catholic on 04/19/2022 15:07:22 -------------------------------------------------------------------------------- Problem List Details Patient Name: Date of Service: [name], [place] 04/19/2022 1:30 PM Medical Record Number: [id number] Patient Account Number: [id number] Date of Birth/Sex: Treating RN: Jul 31, 1956 (66 y.o. [name] Primary Care Provider: [name] Other Clinician: Referring Provider: Treating Provider/Extender: [name] [name]: 1 Active Problems ICD-10 Encounter Code Description Active Date MDM Diagnosis [phone] Non-pressure chronic ulcer of other part of right foot with fat layer exposed 04/12/2022 No Yes I87.2 Venous insufficiency (chronic) (peripheral) 04/12/2022 No Yes G60.9 Hereditary and idiopathic neuropathy, unspecified 04/12/2022 No Yes R73.03 Prediabetes 04/12/2022 No Yes Inactive Problems Resolved Problems Electronic Signature(s) Signed: 04/19/2022 2:34:47 PM By: [name] MD FACS Entered By: [name] on 04/19/2022 14:34:46 -------------------------------------------------------------------------------- Progress Note Details Patient Name: Date of Service: [name], RO [facility] 04/19/2022 1:30 PM Medical Record Number: [id number] Patient Account Number: [id number] Date of Birth/Sex: Treating RN: [birth year] (66 y.o. [name] Primary Care Provider: [name] Other Clinician: Referring  Provider: Treating Provider/Extender: [name] [name]: 1 Subjective Chief Complaint Information obtained from Patient Patient seen for complaints of Non-Healing Wound. History of Present Illness (HPI) ADMISSION 01/21/2022 This is a 66 year old woman presenting with an ulcer on the right fifth metatarsal head. She says that it has been present for about 6 weeks. She is not certain how it developed, but she does have peripheral neuropathy. She also has lymphedema, hypertension, metabolic syndrome/prediabetes, but does not carry an actual diagnosis of diabetes nor is she on any medications for this. She does have  idiopathic peripheral neuropathy and limited sensation in her feet. ABI in clinic today was 0.8. She has been treated in the past for lymphedema, but has been unable to tolerate compression. She does have lymphedema pumps but does not use these regularly. She had been applying Neosporin and new skin to her wound, but it did not really change much. She was referred here by her primary care provider for further evaluation and management. 01/28/2022: 1 week follow-up. Apparently there was some miscommunication with home health and they did not come out to help her with her dressing changes. The patient and her husband have been doing them themselves. The wound actually looks quite good, with decreased dimensions and a very nice healthy surface. 02/05/2022: The home health nurse came out during the week and the patient was happy with the care. The wound is smaller today with a clean base. 02/12/2022: The wound continues to contract and is quite small today. There is a bit of callus buildup, but the wound surface itself is very clean. 02/19/2022: The wound is nearly closed. There is a small amount of callus buildup. No slough or drainage. 02/25/2022: The wound is down to just a sliver. She continues to build up callus. No concern for infection. 03/04/2022: The  wound is closed. READMISSION 04/12/2022 Last week, the patient noticed a spot of blood on her stocking where her wound had been. She noted that the wound was open. She began applying the Prisma silver collagen that we had used during her last admission. She wrapped it up and on Friday when she changed the dressing, she noticed that there was a new wound on the dorsal part of her right foot overlying the fifth metatarsal. Today it is quite red and tender. There is slough on the surface. The wound on the plantar surface has periwound callus accumulation but otherwise appears fairly clean. 04/19/2022: The culture that we took last week somehow has been lost and we have no results. The patient has been applying topical gentamicin and silver alginate to her wound, however and both of them looks smaller. The redness and tenderness has improved significantly. Her foot remains macerated in appearance, however. She had some bleeding from the plantar wound. On inspection, it looks like secondary to moisture, the surrounding skin and callus have lifted. When I probed with a thin cotton applicator, this created some minor bleeding and I think this is likely what happened. The tissue is just quite friable. Patient History Information obtained from Patient. Family History Heart Disease - Father, No family history of Cancer, Diabetes, Hereditary Spherocytosis, Hypertension, Kidney Disease, Lung Disease, Seizures, Stroke, Thyroid Problems, Tuberculosis. Social History Never smoker, Marital Status - Married, Alcohol Use - Never, Drug Use - No History, Caffeine Use - Never. Medical History Hematologic/Lymphatic Patient has history of Lymphedema Cardiovascular Patient has history of Hypertension, Peripheral Venous Disease Musculoskeletal Patient has history of Gout, Rheumatoid Arthritis - ankle Neurologic Patient has history of Neuropathy - Bilateral feet Hospitalization/Surgery History - Hernia repair  (2016);Right ankle reconstruction. - insertion of mesh. - pancreatectomy. - toe surgery bilateral. - cholecystectomy. - abdominal hysterectomy. - tonsillectomy. - wrist surgery. Medical A Surgical History Notes nd Constitutional Symptoms (General Health) obesity Hematologic/Lymphatic Gout, vitamin D deficiency Cardiovascular PEriperal venous insufficiency Gastrointestinal Diverticular disease of colon: GERD, fatty liver Endocrine Prediabetes Neurologic Idiopathic peripheral neuropathy Objective Constitutional No acute distress.. Vitals Time Taken: 1:55 PM, Height: 65 in, Weight: 235 lbs, BMI: 39.1, Temperature: 97.7 F, Pulse: 61 bpm, Respiratory Rate: 18 breaths/min,  Blood Pressure: 115/76 mmHg. Respiratory Normal work of breathing on room air.. General Notes: 04/19/2022: Both wounds look better this week. The redness and tenderness has improved significantly. Her foot remains macerated in appearance, however. She had some bleeding from the plantar wound. On inspection, it looks like secondary to moisture, the surrounding skin and callus have lifted. The tissue is just quite friable. Integumentary (Hair, Skin) Wound #1 status is Open. Original cause of wound was Gradually Appeared. The date acquired was: 12/26/2021. The wound has been in treatment 12 weeks. The wound is located on the Right Metatarsal head fifth. The wound measures 0.5cm length x 0.4cm width x 0.1cm depth; 0.157cm^2 area and 0.016cm^3 volume. There is Fat Layer (Subcutaneous Tissue) exposed. There is no tunneling or undermining noted. There is a medium amount of serosanguineous drainage noted. The wound margin is distinct with the outline attached to the wound base. There is large (67-100%) red granulation within the wound bed. There is a small (1-33%) amount of necrotic tissue within the wound bed including Adherent Slough. Wound #2 status is Open. Original cause of wound was Blister. The date acquired was: 04/09/2022.  The wound has been in treatment 1 weeks. The wound is located on the Right,Lateral Foot. The wound measures 0.6cm length x 2cm width x 0.1cm depth; 0.942cm^2 area and 0.094cm^3 volume. There is Fat Layer (Subcutaneous Tissue) exposed. There is no tunneling or undermining noted. There is a medium amount of serosanguineous drainage noted. The wound margin is distinct with the outline attached to the wound base. There is large (67-100%) red granulation within the wound bed. There is a small (1-33%) amount of necrotic tissue within the wound bed including Adherent Slough. Assessment Active Problems ICD-10 Non-pressure chronic ulcer of other part of right foot with fat layer exposed Venous insufficiency (chronic) (peripheral) Hereditary and idiopathic neuropathy, unspecified Prediabetes Procedures Wound #1 Pre-procedure diagnosis of Wound #1 is a Lymphedema located on the Right Metatarsal head fifth . There was a T Contact Cast Procedure by Alyson Ingles, MD. Post procedure Diagnosis Wound #1: Same as Pre-Procedure Plan Follow-up Appointments: Return Appointment in 1 week. - Dr. Celine Ahr - 6/26 Return Appointment in: - Wed 6/28 for initial cast change Bathing/ Shower/ Hygiene: May shower with protection but do not get wound dressing(s) wet. - Please use cast protector (Can purchase cast protector from local pharmacy, such as [facility] or [facility]) Edema Control - Lymphedema / SCD / Other: Elevate legs to the level of the heart or above for 30 minutes daily and/or when sitting, a frequency of: Avoid standing for long periods of time. Patient to wear own compression stockings every day. - L left Exercise regularly Off-Loading: Wound #1 Right Metatarsal head fifth: T Contact Cast to Right Lower Extremity otal The following medication(s) was prescribed: benzocaine topical 20 % aerosol aerosol topical for prior to debridement was prescribed at facility WOUND #1: - Metatarsal head fifth  Wound Laterality: Right Cleanser: Soap and Water 1 x Per Week/10 Days Discharge Instructions: May shower and wash wound with dial antibacterial soap and water prior to dressing change. Cleanser: Wound Cleanser (Generic) 1 x Per Week/10 Days Discharge Instructions: Cleanse the wound with wound cleanser prior to applying a clean dressing using gauze sponges, not tissue or cotton balls. Cleanser: Byram Ancillary Kit - 15 Day Supply (Generic) 1 x Per Week/10 Days Discharge Instructions: Use supplies as instructed; Kit contains: (15) Saline Bullets; (15) 3x3 Gauze; 15 pr Gloves Topical: Gentamicin 1 x Per Week/10 Days Discharge  Instructions: apply thin layer to wound bed Prim Dressing: KerraCel Ag Gelling Fiber Dressing, 2x2 in (silver alginate) (Generic) 1 x Per Week/10 Days ary Discharge Instructions: Apply silver alginate to wound bed as instructed Secondary Dressing: Optifoam Non-Adhesive Dressing, 4x4 in (Generic) 1 x Per Week/10 Days Discharge Instructions: Apply over primary dressing cut to make foam donut Secondary Dressing: Woven Gauze Sponges 2x2 in (Generic) 1 x Per Week/10 Days Discharge Instructions: Apply over primary dressing as directed. Secured With: Elastic Bandage 4 inch (ACE bandage) (Generic) 1 x Per Week/10 Days Discharge Instructions: Secure with ACE bandage as directed. Secured With: [employer], 4.5x3.1 (in/yd) (Generic) 1 x Per Week/10 Days Discharge Instructions: Secure with Kerlix as directed. Secured With: 40M Medipore H Soft Cloth Surgical T ape, 4 x 10 (in/yd) (Generic) 1 x Per Week/10 Days Discharge Instructions: Secure with tape as directed. WOUND #2: - Foot Wound Laterality: Right, Lateral Cleanser: Soap and Water 1 x Per Week/10 Days Discharge Instructions: May shower and wash wound with dial antibacterial soap and water prior to dressing change. Cleanser: Wound Cleanser 1 x Per Week/10 Days Discharge Instructions: Cleanse the wound with wound cleanser  prior to applying a clean dressing using gauze sponges, not tissue or cotton balls. Topical: Gentamicin 1 x Per Week/10 Days Discharge Instructions: thin layer to wound bed Prim Dressing: KerraCel Ag Gelling Fiber Dressing, 2x2 in (silver alginate) (Generic) 1 x Per Week/10 Days ary Discharge Instructions: Apply silver alginate to wound bed as instructed Secondary Dressing: Woven Gauze Sponge, Non-Sterile 4x4 in (Generic) 1 x Per Week/10 Days Discharge Instructions: Apply over primary dressing as directed. Secured With: Elastic Bandage 4 inch (ACE bandage) (Generic) 1 x Per Week/10 Days Discharge Instructions: Secure with ACE bandage as directed. Secured With: [employer], 4.5x3.1 (in/yd) (Generic) 1 x Per Week/10 Days Discharge Instructions: Secure with Kerlix as directed. Secured With: 40M Medipore H Soft Cloth Surgical T ape, 4 x 10 (in/yd) (Generic) 1 x Per Week/10 Days Discharge Instructions: Secure with tape as directed. 04/19/2022: Both wounds look better this week. The redness and tenderness has improved significantly. Her foot remains macerated in appearance, however. She had some bleeding from the plantar wound. On inspection, it looks like secondary to moisture, the surrounding skin and callus have lifted. The tissue is just quite friable. The patient is frustrated that she has been reopening the same site. She is interested in what options she might have to try and get the wound to heal more quickly. Although she is "prediabetic," she does have significant peripheral neuropathy. I offered the possibility of a total contact cast and after some discussion, she agreed that she would like to try this. We will continue using topical gentamicin with silver alginate to the wound sites. We will apply zinc oxide to the periwound skin secondary to the chronic maceration that has been occurring and then apply a total contact cast. She will return in 2 days for her first cast check and  change. Electronic Signature(s) Signed: 04/19/2022 2:40:10 PM By: [name] MD FACS Entered By: [name] on 04/19/2022 14:40:10 -------------------------------------------------------------------------------- HxROS Details Patient Name: Date of Service: [name], RO [facility] 04/19/2022 1:30 PM Medical Record Number: [id number] Patient Account Number: [id number] Date of Birth/Sex: Treating RN: November 12, 1955 (66 y.o. [name] Primary Care Provider: [name] Other Clinician: Referring Provider: Treating Provider/Extender: [name] [name]: 1 Information Obtained From Patient Constitutional Symptoms (General Health) Medical History: Past Medical History Notes: obesity  Hematologic/Lymphatic Medical History: Positive for: Lymphedema Past Medical History Notes: Gout, vitamin D deficiency Cardiovascular Medical History: Positive for: Hypertension; Peripheral Venous Disease Past Medical History Notes: PEriperal venous insufficiency Gastrointestinal Medical History: Past Medical History Notes: Diverticular disease of colon: GERD, fatty liver Endocrine Medical History: Past Medical History Notes: Prediabetes Musculoskeletal Medical History: Positive for: Gout; Rheumatoid Arthritis - ankle Neurologic Medical History: Positive for: Neuropathy - Bilateral feet Past Medical History Notes: Idiopathic peripheral neuropathy Immunizations Pneumococcal Vaccine: Received Pneumococcal Vaccination: Yes Received Pneumococcal Vaccination On or After 60th Birthday: Yes Implantable Devices None Hospitalization / Surgery History Type of Hospitalization/Surgery Hernia repair (2016);Right ankle reconstruction insertion of mesh pancreatectomy toe surgery bilateral cholecystectomy abdominal hysterectomy tonsillectomy wrist surgery Family and Social History Cancer: No; Diabetes: No; Heart Disease: Yes - Father; Hereditary  Spherocytosis: No; Hypertension: No; Kidney Disease: No; Lung Disease: No; Seizures: No; Stroke: No; Thyroid Problems: No; Tuberculosis: No; Never smoker; Marital Status - Married; Alcohol Use: Never; Drug Use: No History; Caffeine Use: Never; Financial Concerns: No; Food, Clothing or Shelter Needs: No; Support System Lacking: No; Transportation Concerns: No Electronic Signature(s) Signed: 04/19/2022 5:02:10 PM By: [name] MD FACS Signed: 04/19/2022 5:25:20 PM By: Adline Peals Entered By: [name] on 04/19/2022 14:37:31 -------------------------------------------------------------------------------- Total Contact Cast Details Patient Name: Date of Service: [name], RO [facility] 04/19/2022 1:30 PM Medical Record Number: [id number] Patient Account Number: [id number] Date of Birth/Sex: Treating RN: 15-Jul-1956 (66 y.o. [name] Primary Care Provider: [name] Other Clinician: Referring Provider: Treating Provider/Extender: [name] [name]: 1 T Contact Cast Applied for Wound Assessment: otal Wound #1 Right Metatarsal head fifth Performed By: Physician [name], MD Post Procedure Diagnosis Same as Pre-procedure Electronic Signature(s) Signed: 04/19/2022 5:02:10 PM By: [name] MD FACS Signed: 04/21/2022 6:21:16 PM By: Baruch Gouty RN, BSN Entered By: Baruch Gouty on 04/19/2022 14:33:33 -------------------------------------------------------------------------------- SuperBill Details Patient Name: Date of Service: [name], RO [facility] 04/19/2022 Medical Record Number: [id number] Patient Account Number: [id number] Date of Birth/Sex: Treating RN: 1956/04/24 (66 y.o. [name] Primary Care Provider: [name] Other Clinician: Referring Provider: Treating Provider/Extender: [name] [name]: 1 Diagnosis Coding ICD-10 Codes Code  Description [phone] Non-pressure chronic ulcer of other part of right foot with fat layer exposed I87.2 Venous insufficiency (chronic) (peripheral) G60.9 Hereditary and idiopathic neuropathy, unspecified R73.03 Prediabetes Facility Procedures CPT4 Code: [id number] Description: [phone] - APPLY TOTAL CONTACT LEG CAST ICD-10 Diagnosis Description L97.512 Non-pressure chronic ulcer of other part of right foot with fat layer exposed Modifier: Quantity: 1 Physician Procedures : CPT4 Code Description Modifier [id number] - WC PHYS LEVEL 3 - EST PT ICD-10 Diagnosis Description L97.512 Non-pressure chronic ulcer of other part of right foot with fat layer exposed G60.9 Hereditary and idiopathic neuropathy, unspecified R73.03  Prediabetes I87.2 Venous insufficiency (chronic) (peripheral) Quantity: 1 : [phone] - WC PHYS APPLY TOTAL CONTACT CAST ICD-10 Diagnosis Description L97.512 Non-pressure chronic ulcer of other part of right foot with fat layer exposed Quantity: 1 Electronic Signature(s) Signed: 04/19/2022 2:40:25 PM By: [name] MD FACS Entered By: [name] on 04/19/2022 14:40:25

## 2022-04-22 ENCOUNTER — Encounter (HOSPITAL_BASED_OUTPATIENT_CLINIC_OR_DEPARTMENT_OTHER): Payer: Medicare Other | Admitting: General Surgery

## 2022-04-22 DIAGNOSIS — L97512 Non-pressure chronic ulcer of other part of right foot with fat layer exposed: Secondary | ICD-10-CM | POA: Diagnosis not present

## 2022-04-22 NOTE — Progress Notes (Signed)
Evelyn Campbell, Evelyn Campbell ([id number]) Visit Report for 04/22/2022 Arrival Information Details Patient Name: Date of Service: Evelyn Campbell [facility] 04/22/2022 2:45 PM Medical Record Number: [id number] Patient Account Number: [id number] Date of Birth/Sex: Treating RN: Jul 08, 1956 (66 y.o. Evelyn Campbell Primary Care Evelyn Campbell: Leitha Campbell Other Clinician: Referring [name]: Treating [name]/Extender: [name] [name]: 1 Visit Information History Since Last Visit Added or deleted any medications: No Patient Arrived: [name] Any new allergies or adverse reactions: No Arrival Time: 15:13 Had a fall or experienced change in No Accompanied By: spouse activities of daily living that may affect Transfer Assistance: None risk of falls: Patient Identification Verified: Yes Signs or symptoms of abuse/neglect since last visito No Secondary Verification Process Completed: Yes Hospitalized since last visit: No Patient Requires Transmission-Based Precautions: No Implantable device outside of the clinic excluding No Patient Has Alerts: Yes cellular tissue based products placed in the center Patient Alerts: Patient on Blood Thinner since last visit: Has Dressing in Place as Prescribed: Yes Has Footwear/Offloading in Place as Prescribed: Yes Right: T Contact Cast otal Pain Present Now: No Electronic Signature(s) Signed: 04/22/2022 5:01:25 PM By: Baruch Gouty RN, BSN Entered By: Baruch Gouty on 04/22/2022 15:22:26 -------------------------------------------------------------------------------- Encounter Discharge Information Details Patient Name: Date of Service: [name], RO [facility] 04/22/2022 2:45 PM Medical Record Number: [id number] Patient Account Number: [id number] Date of Birth/Sex: Treating RN: November 22, 1955 (66 y.o. Evelyn Campbell Primary Care Evelyn Campbell: Leitha Campbell Other Clinician: Referring Evelyn Campbell: Treating [name]/Extender: [name] [name]: 1 Encounter Discharge Information Items Discharge Condition: Stable Ambulatory Status: Cane Discharge Destination: Home Transportation: Private Auto Accompanied By: spouse Schedule Follow-up Appointment: Yes Clinical Summary of Care: Patient Declined Electronic Signature(s) Signed: 04/22/2022 5:01:25 PM By: Baruch Gouty RN, BSN Entered By: Baruch Gouty on 04/22/2022 15:55:48 -------------------------------------------------------------------------------- Lower Extremity Assessment Details Patient Name: Date of Service: [name], RO [facility] 04/22/2022 2:45 PM Medical Record Number: [id number] Patient Account Number: [id number] Date of Birth/Sex: Treating RN: 24-Sep-1956 (66 y.o. Evelyn Campbell Primary Care Evelyn Campbell: Leitha Campbell Other Clinician: Referring Evelyn Campbell: Treating [name]/Extender: [name] [name]: 1 Edema Assessment Assessed: [Left: No] [Right: No] E[Left: dema] [Right: :] Calf Left: Right: Point of Measurement: From Medial Instep 44 cm Ankle Left: Right: Point of Measurement: From Medial Instep 22.5 cm Vascular Assessment Pulses: Dorsalis Pedis Palpable: [Right:Yes] Electronic Signature(s) Signed: 04/22/2022 5:01:25 PM By: Baruch Gouty RN, BSN Entered By: Baruch Gouty on 04/22/2022 15:32:05 -------------------------------------------------------------------------------- Multi Wound Chart Details Patient Name: Date of Service: [name], RO [facility] 04/22/2022 2:45 PM Medical Record Number: [id number] Patient Account Number: [id number] Date of Birth/Sex: Treating RN: 11-10-1955 (66 y.o. Evelyn Campbell, Evelyn Campbell Primary Care Evelyn Campbell: Leitha Campbell Other Clinician: Referring Evelyn Campbell: Treating [name]/Extender: [name] [name]: 1 Vital Signs Height(in): 65 Pulse(bpm): 37 Weight(lbs): 235 Blood Pressure(mmHg): 129/70 Body  Mass Index(BMI): 39.1 Temperature(F): 98.2 Respiratory Rate(breaths/min): 18 Photos: [1:No Photos Right Metatarsal head fifth] [2:No Photos Right, Lateral Foot] [N/A:N/A N/A] Wound Location: [1:Gradually Appeared] [2:Blister] [N/A:N/A] Wounding Event: [1:Lymphedema] [2:Venous Leg Ulcer] [N/A:N/A] Primary Etiology: [1:Lymphedema, Hypertension,] [2:N/A] [N/A:N/A] Comorbid History: [1:Peripheral Venous Disease, Gout, Rheumatoid Arthritis, Neuropathy 12/26/2021] [2:04/09/2022] [N/A:N/A] Date Acquired: [1:13] [2:1] [N/A:N/A] Weeks of Treatment: [1:Open] [2:Open] [N/A:N/A] Wound Status: [1:No] [2:No] [N/A:N/A] Wound Recurrence: [1:0.5x0.4x0.1] [2:0.5x0.7x0.1] [N/A:N/A] Measurements L x W x D (cm) [1:0.157] [2:0.275] [N/A:N/A] A (cm) : rea [1:0.016] [2:0.027] [N/A:N/A] Volume (cm) : [1:16.50%] [2:92.40%] [N/A:N/A] % Reduction in Area: [  1:78.70%] [2:92.60%] [N/A:N/A] % Reduction in Volume: [1:Full Thickness Without Exposed] [2:Full Thickness Without Exposed] [N/A:N/A] Classification: [1:Support Structures Small] [2:Support Structures Medium] [N/A:N/A] Exudate Amount: [1:Serosanguineous] [2:Serosanguineous] [N/A:N/A] Exudate Type: [1:red, brown] [2:red, brown] [N/A:N/A] Exudate Color: [1:Distinct, outline attached] [2:N/A] [N/A:N/A] Wound Margin: [1:Large (67-100%)] [2:N/A] [N/A:N/A] Granulation Amount: [1:Red] [2:N/A] [N/A:N/A] Granulation Quality: [1:None Present (0%)] [2:N/A] [N/A:N/A] Necrotic Amount: [1:Fat Layer (Subcutaneous Tissue): Yes N/A] [N/A:N/A] Exposed Structures: [1:Fascia: No Tendon: No Muscle: No Joint: No Bone: No Large (67-100%)] [2:N/A] [N/A:N/A] Treatment Notes Electronic Signature(s) Signed: 04/22/2022 3:40:01 PM By: [name] MD FACS Signed: 04/22/2022 5:01:25 PM By: Baruch Gouty RN, BSN Entered By: [name] on 04/22/2022 15:40:00 -------------------------------------------------------------------------------- Multi-Disciplinary Care Plan  Details Patient Name: Date of Service: [name], RO [facility] 04/22/2022 2:45 PM Medical Record Number: [id number] Patient Account Number: [id number] Date of Birth/Sex: Treating RN: [birth year] (66 y.o. Evelyn Campbell Primary Care Moana Munford: Leitha Campbell Other Clinician: Referring Cashlyn Huguley: Treating Claudeen Leason/Extender: [name] [name]: 1 Multidisciplinary Care Plan reviewed with physician Active Inactive Wound/Skin Impairment Nursing Diagnoses: Impaired tissue integrity Knowledge deficit related to ulceration/compromised skin integrity Goals: Patient/caregiver will verbalize understanding of skin care regimen Date Initiated: 04/12/2022 Target Resolution Date: 05/07/2022 Goal Status: Active Ulcer/skin breakdown will have a volume reduction of 30% by week 4 Date Initiated: 04/12/2022 Target Resolution Date: 05/07/2022 Goal Status: Active Interventions: Assess patient/caregiver ability to obtain necessary supplies Assess patient/caregiver ability to perform ulcer/skin care regimen upon admission and as needed Assess ulceration(s) every visit Treatment Activities: Skin care regimen initiated : 04/12/2022 Topical wound management initiated : 04/12/2022 Notes: Electronic Signature(s) Signed: 04/22/2022 5:01:25 PM By: Baruch Gouty RN, BSN Entered By: Baruch Gouty on 04/22/2022 15:54:14 -------------------------------------------------------------------------------- Pain Assessment Details Patient Name: Date of Service: [name], RO [facility] 04/22/2022 2:45 PM Medical Record Number: [id number] Patient Account Number: [id number] Date of Birth/Sex: Treating RN: 09/16/1956 (66 y.o. Evelyn Campbell Primary Care Masiah Woody: Leitha Campbell Other Clinician: Referring Geraldin Habermehl: Treating Karlin Binion/Extender: [name] [name]: 1 Active Problems Location of Pain Severity and Description of Pain Patient Has Paino  No Site Locations Rate the pain. Current Pain Level: 0 Pain Management and Medication Current Pain Management: Electronic Signature(s) Signed: 04/22/2022 5:01:25 PM By: Baruch Gouty RN, BSN Entered By: Baruch Gouty on 04/22/2022 15:22:56 -------------------------------------------------------------------------------- Patient/Caregiver Education Details Patient Name: Date of Service: Modena Morrow, RO [facility] 6/22/2023andnbsp2:45 PM Medical Record Number: [id number] Patient Account Number: [id number] Date of Birth/Gender: Treating RN: June 15, 1956 (66 y.o. Evelyn Campbell Primary Care Physician: Leitha Campbell Other Clinician: Referring Physician: Treating Physician/Extender: [name] [name]: 1 Education Assessment Education Provided To: Patient Education Topics Provided Offloading: Methods: Explain/Verbal Responses: Reinforcements needed, State content correctly Wound/Skin Impairment: Methods: Explain/Verbal Responses: Reinforcements needed, State content correctly Electronic Signature(s) Signed: 04/22/2022 5:01:25 PM By: Baruch Gouty RN, BSN Entered By: Baruch Gouty on 04/22/2022 15:54:34 -------------------------------------------------------------------------------- Wound Assessment Details Patient Name: Date of Service: [name], RO [facility] 04/22/2022 2:45 PM Medical Record Number: [id number] Patient Account Number: [id number] Date of Birth/Sex: Treating RN: 06-27-1956 (66 y.o. Evelyn Campbell Primary Care Saige Busby: Leitha Campbell Other Clinician: Referring Brok Stocking: Treating Shaquoia Miers/Extender: [name] [name]: 1 Wound Status Wound Number: 1 Primary Lymphedema Etiology: Wound Location: Right Metatarsal head fifth Wound Open Wounding Event: Gradually Appeared Status: Date Acquired: 12/26/2021 Comorbid Lymphedema, Hypertension, Peripheral Venous Disease, Gout, Weeks Of  Treatment: 13 History: Rheumatoid Arthritis, Neuropathy Clustered Wound: No Wound  Measurements Length: (cm) 0.5 Width: (cm) 0.4 Depth: (cm) 0.1 Area: (cm) 0.157 Volume: (cm) 0.016 % Reduction in Area: 16.5% % Reduction in Volume: 78.7% Epithelialization: Large (67-100%) Tunneling: No Undermining: No Wound Description Classification: Full Thickness Without Exposed Support Structures Wound Margin: Distinct, outline attached Exudate Amount: Small Exudate Type: Serosanguineous Exudate Color: red, brown Foul Odor After Cleansing: No Slough/Fibrino Yes Wound Bed Granulation Amount: Large (67-100%) Exposed Structure Granulation Quality: Red Fascia Exposed: No Necrotic Amount: None Present (0%) Fat Layer (Subcutaneous Tissue) Exposed: Yes Tendon Exposed: No Muscle Exposed: No Joint Exposed: No Bone Exposed: No Treatment Notes Wound #1 (Metatarsal head fifth) Wound Laterality: Right Cleanser Soap and Water Discharge Instruction: May shower and wash wound with dial antibacterial soap and water prior to dressing change. Wound Cleanser Discharge Instruction: Cleanse the wound with wound cleanser prior to applying a clean dressing using gauze sponges, not tissue or cotton balls. [employer] Ancillary Kit - 15 Day Supply Discharge Instruction: Use supplies as instructed; Kit contains: (15) Saline Bullets; (15) 3x3 Gauze; 15 pr Gloves Peri-Wound Care Topical Gentamicin Discharge Instruction: apply thin layer to wound bed Primary Dressing KerraCel Ag Gelling Fiber Dressing, 2x2 in (silver alginate) Discharge Instruction: Apply silver alginate to wound bed as instructed Secondary Dressing Optifoam Non-Adhesive Dressing, 4x4 in Discharge Instruction: Apply over primary dressing cut to make foam donut Woven Gauze Sponges 2x2 in Discharge Instruction: Apply over primary dressing as directed. Secured With Elastic Bandage 4 inch (ACE bandage) Discharge Instruction: Secure with ACE  bandage as directed. Kerlix Roll Sterile, 4.5x3.1 (in/yd) Discharge Instruction: Secure with Kerlix as directed. 74M Medipore H Soft Cloth Surgical T ape, 4 x 10 (in/yd) Discharge Instruction: Secure with tape as directed. Compression Wrap Compression Stockings Add-Ons Electronic Signature(s) Signed: 04/22/2022 5:01:25 PM By: Baruch Gouty RN, BSN Entered By: Baruch Gouty on 04/22/2022 15:34:44 -------------------------------------------------------------------------------- Wound Assessment Details Patient Name: Date of Service: [name], RO [facility] 04/22/2022 2:45 PM Medical Record Number: [id number] Patient Account Number: [id number] Date of Birth/Sex: Treating RN: [birth year] (66 y.o. Evelyn Campbell, Evelyn Campbell Primary Care Osiah Haring: Leitha Campbell Other Clinician: Referring Ameliya Nicotra: Treating Tatum Massman/Extender: [name] [name]: 1 Wound Status Wound Number: 2 Primary Etiology: Venous Leg Ulcer Wound Location: Right, Lateral Foot Wound Status: Open Wounding Event: Blister Date Acquired: 04/09/2022 Weeks Of Treatment: 1 Clustered Wound: No Wound Measurements Length: (cm) 0.5 Width: (cm) 0.7 Depth: (cm) 0.1 Area: (cm) 0.275 Volume: (cm) 0.027 % Reduction in Area: 92.4% % Reduction in Volume: 92.6% Wound Description Classification: Full Thickness Without Exposed Support Structu Exudate Amount: Medium Exudate Type: Serosanguineous Exudate Color: red, brown res Treatment Notes Wound #2 (Foot) Wound Laterality: Right, Lateral Cleanser Soap and Water Discharge Instruction: May shower and wash wound with dial antibacterial soap and water prior to dressing change. Wound Cleanser Discharge Instruction: Cleanse the wound with wound cleanser prior to applying a clean dressing using gauze sponges, not tissue or cotton balls. Peri-Wound Care Topical Gentamicin Discharge Instruction: thin layer to wound bed Primary Dressing KerraCel Ag  Gelling Fiber Dressing, 2x2 in (silver alginate) Discharge Instruction: Apply silver alginate to wound bed as instructed Secondary Dressing Woven Gauze Sponge, Non-Sterile 4x4 in Discharge Instruction: Apply over primary dressing as directed. Secured With Elastic Bandage 4 inch (ACE bandage) Discharge Instruction: Secure with ACE bandage as directed. Kerlix Roll Sterile, 4.5x3.1 (in/yd) Discharge Instruction: Secure with Kerlix as directed. 74M Medipore H Soft Cloth Surgical T ape, 4 x 10 (in/yd) Discharge Instruction: Secure with tape as directed. Compression  Wrap Compression Stockings Environmental education officer) Signed: 04/22/2022 5:01:25 PM By: Baruch Gouty RN, BSN Entered By: Baruch Gouty on 04/22/2022 15:34:06 -------------------------------------------------------------------------------- Vitals Details Patient Name: Date of Service: [name], RO [facility] 04/22/2022 2:45 PM Medical Record Number: [id number] Patient Account Number: [id number] Date of Birth/Sex: Treating RN: 1956/07/06 (66 y.o. Evelyn Campbell Primary Care Kendy Haston: Leitha Campbell Other Clinician: Referring Meryem Haertel: Treating Keiran Gaffey/Extender: [name] [name]: 1 Vital Signs Time Taken: 15:22 Temperature (F): 98.2 Height (in): 65 Pulse (bpm): 58 Weight (lbs): 235 Respiratory Rate (breaths/min): 18 Body Mass Index (BMI): 39.1 Blood Pressure (mmHg): 129/70 Reference Range: 80 - 120 mg / dl Electronic Signature(s) Signed: 04/22/2022 5:01:25 PM By: Baruch Gouty RN, BSN Entered By: Baruch Gouty on 04/22/2022 15:22:49

## 2022-04-22 NOTE — Progress Notes (Addendum)
Evelyn, Campbell ([id number]) Visit Report for 04/22/2022 Chief Complaint Document Details Patient Name: Date of Service: Evelyn Campbell [facility] 04/22/2022 2:45 PM Medical Record Number: [id number] Patient Account Number: [id number] Date of Birth/Sex: Treating RN: 03-Jun-1956 (66 y.o. Evelyn Campbell Primary Care Provider: Leitha Campbell Other Clinician: Referring Provider: Treating Provider/Extender: Evelyn Campbell [name]: 1 Information Obtained from: Patient Chief Complaint Patient seen for complaints of Non-Healing Wound. Electronic Signature(s) Signed: 04/22/2022 3:40:05 PM By: Evelyn Maudlin MD FACS Entered By: Evelyn Campbell on 04/22/2022 15:40:05 -------------------------------------------------------------------------------- HPI Details Patient Name: Date of Service: Evelyn Campbell, Evelyn Campbell 04/22/2022 2:45 PM Medical Record Number: [id number] Patient Account Number: [id number] Date of Birth/Sex: Treating RN: 1956/09/04 (66 y.o. Evelyn Campbell Primary Care Provider: Leitha Campbell Other Clinician: Referring Provider: Treating Provider/Extender: Evelyn Campbell [name]: 1 History of Present Illness HPI Description: ADMISSION 01/21/2022 This is a 66 year old woman presenting with an ulcer on the right fifth metatarsal head. She says that it has been present for about 6 weeks. She is not certain how it developed, but she does have peripheral neuropathy. She also has lymphedema, hypertension, metabolic syndrome/prediabetes, but does not carry an actual diagnosis of diabetes nor is she on any medications for this. 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 ABI in clinic today was 0.8. She has been treated in the past for lymphedema, but has been unable to tolerate compression. She does have lymphedema pumps but does not use these regularly. She had been applying Neosporin and new skin  to her wound, but it did not really change much. She was referred here by her primary care provider for further evaluation and management. 01/28/2022: 1 week follow-up. Apparently there was some miscommunication with home health and they did not come out to help her with her dressing changes. The patient and her husband have been doing them themselves. The wound actually looks quite good, with decreased dimensions and a very nice healthy surface. 02/05/2022: The home health nurse came out during the week and the patient was happy with the care. The wound is smaller today with a clean base. 02/12/2022: The wound continues to contract and is quite small today. There is a bit of callus buildup, but the wound surface itself is very clean. 02/19/2022: The wound is nearly closed. There is a small amount of callus buildup. No slough or drainage. 02/25/2022: The wound is down to just a sliver. She continues to build up callus. No concern for infection. 03/04/2022: The wound is closed. READMISSION 04/12/2022 Last week, the patient noticed a spot of blood on her stocking where her wound had been. She noted that the wound was open. She began applying the Prisma silver collagen that we had used during her last admission. She wrapped it up and on Friday when she changed the dressing, she noticed that there was a new wound on the dorsal part of her right foot overlying the fifth metatarsal. Today it is quite red and tender. There is slough on the surface. The wound on the plantar surface has periwound callus accumulation but otherwise appears fairly clean. 04/19/2022: The culture that we took last week somehow has been lost and we have no results. The patient has been applying topical gentamicin and silver alginate to her wound, however and both of them looks smaller. The redness and tenderness has improved significantly. Her foot remains macerated in appearance, however. She had some bleeding from  the plantar wound. On  inspection, it looks like secondary to moisture, the surrounding skin and callus have lifted. When I probed with a thin cotton applicator, this created some minor bleeding and I think this is likely what happened. The tissue is just quite friable. 04/22/2022: The culture data were finally found and she did grow Proteus mirabilis from her wound. I prescribed Bactrim for her. She is here today for her first cast change. Both wound sites look cleaner and smaller today even after just a few days of casting. Electronic Signature(s) Signed: 04/22/2022 3:41:16 PM By: Evelyn Maudlin MD FACS Entered By: Evelyn Campbell on 04/22/2022 15:41:15 -------------------------------------------------------------------------------- Physical Exam Details Patient Name: Date of Service: Evelyn Campbell, Evelyn Campbell [facility] 04/22/2022 2:45 PM Medical Record Number: [id number] Patient Account Number: [id number] Date of Birth/Sex: Treating RN: 1956-02-25 (66 y.o. Evelyn Campbell Primary Care Provider: Leitha Campbell Other Clinician: Referring Provider: Treating Provider/Extender: Evelyn Campbell [name]: 1 Constitutional . Slightly bradycardic, asymptomatic.. . . No acute distress.Marland Kitchen Respiratory Normal work of breathing on room air.. Notes 04/22/2022: Both wound sites look cleaner and smaller today even after just a few days of casting. Electronic Signature(s) Signed: 04/22/2022 3:41:56 PM By: Evelyn Maudlin MD FACS Entered By: Evelyn Campbell on 04/22/2022 15:41:56 -------------------------------------------------------------------------------- Physician Orders Details Patient Name: Date of Service: Evelyn Campbell, Evelyn Campbell [facility] 04/22/2022 2:45 PM Medical Record Number: [id number] Patient Account Number: [id number] Date of Birth/Sex: Treating RN: 03-19-1956 (66 y.o. Evelyn Campbell, Evelyn Campbell Primary Care Provider: Leitha Campbell Other Clinician: Referring Provider: Treating Provider/Extender: Evelyn Campbell [name]: 1 Verbal / Phone Orders: No Diagnosis Coding ICD-10 Coding Code Description [phone] Non-pressure chronic ulcer of other part of right foot with fat layer exposed I87.2 Venous insufficiency (chronic) (peripheral) G60.9 Hereditary and idiopathic neuropathy, unspecified R73.03 Prediabetes Follow-up Appointments ppointment in 1 week. - Dr. Celine Ahr - 6/26 at 3:30pm Return A ppointment in: - Thurs 6/22 at 2:45pm Return A Bathing/ Shower/ Hygiene May shower with protection but do not get wound dressing(s) wet. - Please use cast protector (Can purchase cast protector from local pharmacy, such as [facility] or [facility]) Edema Control - Lymphedema / SCD / Other Elevate legs to the level of the heart or above for 30 minutes daily and/or when sitting, a frequency of: Avoid standing for long periods of time. Patient to wear own compression stockings every day. - L left Exercise regularly Off-Loading Wound #1 Right Metatarsal head fifth Total Contact Cast to Right Lower Extremity Wound Treatment Wound #1 - Metatarsal head fifth Wound Laterality: Right Cleanser: Soap and Water 1 x Per Week/10 Days Discharge Instructions: May shower and wash wound with dial antibacterial soap and water prior to dressing change. Cleanser: Wound Cleanser (Generic) 1 x Per Week/10 Days Discharge Instructions: Cleanse the wound with wound cleanser prior to applying a clean dressing using gauze sponges, not tissue or cotton balls. Cleanser: [place] Ancillary Kit - 15 Day Supply (Generic) 1 x Per Week/10 Days Discharge Instructions: Use supplies as instructed; Kit contains: (15) Saline Bullets; (15) 3x3 Gauze; 15 pr Gloves Topical: Gentamicin 1 x Per Week/10 Days Discharge Instructions: apply thin layer to wound bed Prim Dressing: KerraCel Ag Gelling Fiber Dressing, 2x2 in (silver alginate) (Generic) 1 x Per Week/10 Days ary Discharge Instructions: Apply silver alginate to wound  bed as instructed Secondary Dressing: Optifoam Non-Adhesive Dressing, 4x4 in (Generic) 1 x Per Week/10 Days Discharge Instructions: Apply over primary dressing cut to make foam donut Secondary  Dressing: Woven Gauze Sponges 2x2 in (Generic) 1 x Per Week/10 Days Discharge Instructions: Apply over primary dressing as directed. Secured With: Elastic Bandage 4 inch (ACE bandage) (Generic) 1 x Per Week/10 Days Discharge Instructions: Secure with ACE bandage as directed. Secured With: [employer], 4.5x3.1 (in/yd) (Generic) 1 x Per Week/10 Days Discharge Instructions: Secure with Kerlix as directed. Secured With: 29M Medipore H Soft Cloth Surgical T ape, 4 x 10 (in/yd) (Generic) 1 x Per Week/10 Days Discharge Instructions: Secure with tape as directed. Wound #2 - Foot Wound Laterality: Right, Lateral Cleanser: Soap and Water 1 x Per Week/10 Days Discharge Instructions: May shower and wash wound with dial antibacterial soap and water prior to dressing change. Cleanser: Wound Cleanser 1 x Per Week/10 Days Discharge Instructions: Cleanse the wound with wound cleanser prior to applying a clean dressing using gauze sponges, not tissue or cotton balls. Topical: Gentamicin 1 x Per Week/10 Days Discharge Instructions: thin layer to wound bed Prim Dressing: KerraCel Ag Gelling Fiber Dressing, 2x2 in (silver alginate) (Generic) 1 x Per Week/10 Days ary Discharge Instructions: Apply silver alginate to wound bed as instructed Secondary Dressing: Woven Gauze Sponge, Non-Sterile 4x4 in (Generic) 1 x Per Week/10 Days Discharge Instructions: Apply over primary dressing as directed. Secured With: Elastic Bandage 4 inch (ACE bandage) (Generic) 1 x Per Week/10 Days Discharge Instructions: Secure with ACE bandage as directed. Secured With: [employer], 4.5x3.1 (in/yd) (Generic) 1 x Per Week/10 Days Discharge Instructions: Secure with Kerlix as directed. Secured With: 29M Medipore H Soft Cloth Surgical T  ape, 4 x 10 (in/yd) (Generic) 1 x Per Week/10 Days Discharge Instructions: Secure with tape as directed. Electronic Signature(s) Signed: 04/22/2022 4:05:30 PM By: Evelyn Maudlin MD FACS Signed: 04/22/2022 5:01:25 PM By: Baruch Gouty RN, BSN Entered By: Baruch Gouty on 04/22/2022 15:55:10 -------------------------------------------------------------------------------- Problem List Details Patient Name: Date of Service: Evelyn Campbell, Evelyn Campbell [facility] 04/22/2022 2:45 PM Medical Record Number: [id number] Patient Account Number: [id number] Date of Birth/Sex: Treating RN: 1956-07-21 (66 y.o. Evelyn Campbell Primary Care Provider: Leitha Campbell Other Clinician: Referring Provider: Treating Provider/Extender: Evelyn Campbell [name]: 1 Active Problems ICD-10 Encounter Code Description Active Date MDM Diagnosis [phone] Non-pressure chronic ulcer of other part of right foot with fat layer exposed 04/12/2022 No Yes I87.2 Venous insufficiency (chronic) (peripheral) 04/12/2022 No Yes G60.9 Hereditary and idiopathic neuropathy, unspecified 04/12/2022 No Yes R73.03 Prediabetes 04/12/2022 No Yes Inactive Problems Resolved Problems Electronic Signature(s) Signed: 04/22/2022 3:39:55 PM By: Evelyn Maudlin MD FACS Entered By: Evelyn Campbell on 04/22/2022 15:39:54 -------------------------------------------------------------------------------- Progress Note Details Patient Name: Date of Service: Evelyn Campbell, Evelyn Campbell [facility] 04/22/2022 2:45 PM Medical Record Number: [id number] Patient Account Number: [id number] Date of Birth/Sex: Treating RN: 11/24/1955 (66 y.o. Evelyn Campbell Primary Care Provider: Leitha Campbell Other Clinician: Referring Provider: Treating Provider/Extender: Evelyn Campbell [name]: 1 Subjective Chief Complaint Information obtained from Patient Patient seen for complaints of Non-Healing Wound. History of Present  Illness (HPI) ADMISSION 01/21/2022 This is a 66 year old woman presenting with an ulcer on the right fifth metatarsal head. She says that it has been present for about 6 weeks. She is not certain how it developed, but she does have peripheral neuropathy. She also has lymphedema, hypertension, metabolic syndrome/prediabetes, but does not carry an actual diagnosis of diabetes nor is she on any medications for this. 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 ABI in clinic today was 0.8.  She has been treated in the past for lymphedema, but has been unable to tolerate compression. She does have lymphedema pumps but does not use these regularly. She had been applying Neosporin and new skin to her wound, but it did not really change much. She was referred here by her primary care provider for further evaluation and management. 01/28/2022: 1 week follow-up. Apparently there was some miscommunication with home health and they did not come out to help her with her dressing changes. The patient and her husband have been doing them themselves. The wound actually looks quite good, with decreased dimensions and a very nice healthy surface. 02/05/2022: The home health nurse came out during the week and the patient was happy with the care. The wound is smaller today with a clean base. 02/12/2022: The wound continues to contract and is quite small today. There is a bit of callus buildup, but the wound surface itself is very clean. 02/19/2022: The wound is nearly closed. There is a small amount of callus buildup. No slough or drainage. 02/25/2022: The wound is down to just a sliver. She continues to build up callus. No concern for infection. 03/04/2022: The wound is closed. READMISSION 04/12/2022 Last week, the patient noticed a spot of blood on her stocking where her wound had been. She noted that the wound was open. She began applying the Prisma silver collagen that we had used  during her last admission. She wrapped it up and on Friday when she changed the dressing, she noticed that there was a new wound on the dorsal part of her right foot overlying the fifth metatarsal. Today it is quite red and tender. There is slough on the surface. The wound on the plantar surface has periwound callus accumulation but otherwise appears fairly clean. 04/19/2022: The culture that we took last week somehow has been lost and we have no results. The patient has been applying topical gentamicin and silver alginate to her wound, however and both of them looks smaller. The redness and tenderness has improved significantly. Her foot remains macerated in appearance, however. She had some bleeding from the plantar wound. On inspection, it looks like secondary to moisture, the surrounding skin and callus have lifted. When I probed with a thin cotton applicator, this created some minor bleeding and I think this is likely what happened. The tissue is just quite friable. 04/22/2022: The culture data were finally found and she did grow Proteus mirabilis from her wound. I prescribed Bactrim for her. She is here today for her first cast change. Both wound sites look cleaner and smaller today even after just a few days of casting. Patient History Information obtained from Patient. Family History Heart Disease - Father, No family history of Cancer, Diabetes, Hereditary Spherocytosis, Hypertension, Kidney Disease, Lung Disease, Seizures, Stroke, Thyroid Problems, Tuberculosis. Social History Never smoker, Marital Status - Married, Alcohol Use - Never, Drug Use - No History, Caffeine Use - Never. Medical History Hematologic/Lymphatic Patient has history of Lymphedema Cardiovascular Patient has history of Hypertension, Peripheral Venous Disease Musculoskeletal Patient has history of Gout, Rheumatoid Arthritis - ankle Neurologic Patient has history of Neuropathy - Bilateral  feet Hospitalization/Surgery History - Hernia repair (2016);Right ankle reconstruction. - insertion of mesh. - pancreatectomy. - toe surgery bilateral. - cholecystectomy. - abdominal hysterectomy. - tonsillectomy. - wrist surgery. Medical A Surgical History Notes Campbell Constitutional Symptoms (General Health) obesity Hematologic/Lymphatic Gout, vitamin D deficiency Cardiovascular PEriperal venous insufficiency Gastrointestinal Diverticular disease of colon: GERD, fatty liver Endocrine Prediabetes Neurologic  Idiopathic peripheral neuropathy Objective Constitutional Slightly bradycardic, asymptomatic.Marland Kitchen No acute distress.. Vitals Time Taken: 3:22 PM, Height: 65 in, Weight: 235 lbs, BMI: 39.1, Temperature: 98.2 F, Pulse: 58 bpm, Respiratory Rate: 18 breaths/min, Blood Pressure: 129/70 mmHg. Respiratory Normal work of breathing on room air.. General Notes: 04/22/2022: Both wound sites look cleaner and smaller today even after just a few days of casting. Integumentary (Hair, Skin) Wound #1 status is Open. Original cause of wound was Gradually Appeared. The date acquired was: 12/26/2021. The wound has been in treatment 13 weeks. The wound is located on the Right Metatarsal head fifth. The wound measures 0.5cm length x 0.4cm width x 0.1cm depth; 0.157cm^2 area and 0.016cm^3 volume. There is Fat Layer (Subcutaneous Tissue) exposed. There is no tunneling or undermining noted. There is a small amount of serosanguineous drainage noted. The wound margin is distinct with the outline attached to the wound base. There is large (67-100%) red granulation within the wound bed. There is no necrotic tissue within the wound bed. Wound #2 status is Open. Original cause of wound was Blister. The date acquired was: 04/09/2022. The wound has been in treatment 1 weeks. The wound is located on the Right,Lateral Foot. The wound measures 0.5cm length x 0.7cm width x 0.1cm depth; 0.275cm^2 area and 0.027cm^3 volume.  There is a medium amount of serosanguineous drainage noted. Assessment Active Problems ICD-10 Non-pressure chronic ulcer of other part of right foot with fat layer exposed Venous insufficiency (chronic) (peripheral) Hereditary and idiopathic neuropathy, unspecified Prediabetes Plan 04/22/2022: Both wound sites look cleaner and smaller today even after just a few days of casting. No debridement was necessary today. We will apply gentamicin to the wounds and reapply her total contact cast. Follow-up in 1 week. Electronic Signature(s) Signed: 04/22/2022 3:42:58 PM By: Evelyn Maudlin MD FACS Entered By: Evelyn Campbell on 04/22/2022 15:42:57 -------------------------------------------------------------------------------- HxROS Details Patient Name: Date of Service: Evelyn Campbell, Evelyn Campbell [facility] 04/22/2022 2:45 PM Medical Record Number: [id number] Patient Account Number: [id number] Date of Birth/Sex: Treating RN: Apr 30, 1956 (66 y.o. Evelyn Campbell Primary Care Provider: Leitha Campbell Other Clinician: Referring Provider: Treating Provider/Extender: Evelyn Campbell [name]: 1 Information Obtained From Patient Constitutional Symptoms (General Health) Medical History: Past Medical History Notes: obesity Hematologic/Lymphatic Medical History: Positive for: Lymphedema Past Medical History Notes: Gout, vitamin D deficiency Cardiovascular Medical History: Positive for: Hypertension; Peripheral Venous Disease Past Medical History Notes: PEriperal venous insufficiency Gastrointestinal Medical History: Past Medical History Notes: Diverticular disease of colon: GERD, fatty liver Endocrine Medical History: Past Medical History Notes: Prediabetes Musculoskeletal Medical History: Positive for: Gout; Rheumatoid Arthritis - ankle Neurologic Medical History: Positive for: Neuropathy - Bilateral feet Past Medical History Notes: Idiopathic peripheral  neuropathy Immunizations Pneumococcal Vaccine: Received Pneumococcal Vaccination: Yes Received Pneumococcal Vaccination On or After 60th Birthday: Yes Implantable Devices None Hospitalization / Surgery History Type of Hospitalization/Surgery Hernia repair (2016);Right ankle reconstruction insertion of mesh pancreatectomy toe surgery bilateral cholecystectomy abdominal hysterectomy tonsillectomy wrist surgery Family and Social History Cancer: No; Diabetes: No; Heart Disease: Yes - Father; Hereditary Spherocytosis: No; Hypertension: No; Kidney Disease: No; Lung Disease: No; Seizures: No; Stroke: No; Thyroid Problems: No; Tuberculosis: No; Never smoker; Marital Status - Married; Alcohol Use: Never; Drug Use: No History; Caffeine Use: Never; Financial Concerns: No; Food, Clothing or Shelter Needs: No; Support System Lacking: No; Transportation Concerns: No Engineer, maintenance) Signed: 04/22/2022 4:05:30 PM By: Evelyn Maudlin MD FACS Signed: 04/22/2022 5:01:25 PM By: Baruch Gouty RN, BSN Entered By: Evelyn Campbell on  04/22/2022 15:41:20 -------------------------------------------------------------------------------- Total Contact Cast Details Patient Name: Date of Service: Evelyn Campbell [facility] 04/22/2022 2:45 PM Medical Record Number: [id number] Patient Account Number: [id number] Date of Birth/Sex: Treating RN: 1955/11/26 (66 y.o. Evelyn Campbell Primary Care Provider: Other Clinician: Leitha Campbell Referring Provider: Treating Provider/Extender: Evelyn Campbell [name]: 1 T Contact Cast Applied for Wound Assessment: otal Wound #1 Right Metatarsal head fifth Performed By: Physician Evelyn Maudlin, MD Post Procedure Diagnosis Same as Pre-procedure Electronic Signature(s) Signed: 04/22/2022 4:05:30 PM By: Evelyn Maudlin MD FACS Signed: 04/22/2022 5:01:25 PM By: Baruch Gouty RN, BSN Entered By: Baruch Gouty on 04/22/2022  15:54:05 -------------------------------------------------------------------------------- [place] Details Patient Name: Date of Service: Evelyn Campbell, Evelyn Campbell [facility] 04/22/2022 Medical Record Number: [id number] Patient Account Number: [id number] Date of Birth/Sex: Treating RN: Dec 14, 1955 (66 y.o. Evelyn Campbell Primary Care Provider: Leitha Campbell Other Clinician: Referring Provider: Treating Provider/Extender: Evelyn Campbell [name]: 1 Diagnosis Coding ICD-10 Codes Code Description [phone] Non-pressure chronic ulcer of other part of right foot with fat layer exposed I87.2 Venous insufficiency (chronic) (peripheral) G60.9 Hereditary and idiopathic neuropathy, unspecified R73.03 Prediabetes Facility Procedures CPT4 Code: [id number] Description: [phone] - APPLY TOTAL CONTACT LEG CAST ICD-10 Diagnosis Description L97.512 Non-pressure chronic ulcer of other part of right foot with fat layer exposed Modifier: Quantity: 1 Physician Procedures : CPT4 Code Description Modifier [id number] - WC PHYS LEVEL 3 - EST PT ICD-10 Diagnosis Description L97.512 Non-pressure chronic ulcer of other part of right foot with fat layer exposed I87.2 Venous insufficiency (chronic) (peripheral) G60.9  Hereditary and idiopathic neuropathy, unspecified R73.03 Prediabetes Quantity: 1 : [id number] - WC PHYS APPLY TOTAL CONTACT CAST ICD-10 Diagnosis Description L97.512 Non-pressure chronic ulcer of other part of right foot with fat layer exposed Quantity: 1 Electronic Signature(s) Signed: 04/22/2022 3:43:35 PM By: Evelyn Maudlin MD FACS Entered By: Evelyn Campbell on 04/22/2022 15:43:34

## 2022-04-26 ENCOUNTER — Encounter (HOSPITAL_BASED_OUTPATIENT_CLINIC_OR_DEPARTMENT_OTHER): Payer: Medicare Other | Admitting: General Surgery

## 2022-04-26 DIAGNOSIS — L97512 Non-pressure chronic ulcer of other part of right foot with fat layer exposed: Secondary | ICD-10-CM | POA: Diagnosis not present

## 2022-04-29 ENCOUNTER — Encounter (HOSPITAL_BASED_OUTPATIENT_CLINIC_OR_DEPARTMENT_OTHER): Payer: Medicare Other | Admitting: General Surgery

## 2022-04-29 DIAGNOSIS — L97512 Non-pressure chronic ulcer of other part of right foot with fat layer exposed: Secondary | ICD-10-CM | POA: Diagnosis not present

## 2022-04-29 NOTE — Progress Notes (Signed)
KISHA, MESSMAN ([id number]) Visit Report for 04/29/2022 Arrival Information Details Patient Name: Date of Service: [name] [facility] 04/29/2022 9:30 A M Medical Record Number: [id number] Patient Account Number: [id number] Date of Birth/Sex: Treating RN: 21-Jun-1956 (66 y.o. [name] Primary Care Sharicka Pogorzelski: [name] Other Clinician: Referring Florine Sprenkle: Treating Kimber Fritts/Extender: [name] [name]: 2 Visit Information History Since Last Visit Added or deleted any medications: No Patient Arrived: [name] Any new allergies or adverse reactions: No Arrival Time: 09:39 Had a fall or experienced change in No Accompanied By: self activities of daily living that may affect Transfer Assistance: None risk of falls: Patient Identification Verified: Yes Signs or symptoms of abuse/neglect since last visito No Patient Requires Transmission-Based Precautions: No Hospitalized since last visit: No Patient Has Alerts: Yes Implantable device outside of the clinic excluding No Patient Alerts: Patient on Blood Thinner cellular tissue based products placed in the center since last visit: Has Dressing in Place as Prescribed: Yes Pain Present Now: No Electronic Signature(s) Signed: 04/29/2022 6:30:20 PM By: Dellie Catholic RN Entered By: Dellie Catholic on 04/29/2022 09:39:42 -------------------------------------------------------------------------------- Encounter Discharge Information Details Patient Name: Date of Service: [place], RO [facility] 04/29/2022 9:30 A M Medical Record Number: [id number] Patient Account Number: [id number] Date of Birth/Sex: Treating RN: Jul 22, 1956 (66 y.o. [name] Primary Care Larosa Rhines: [name] Other Clinician: Referring Sebastien Jackson: Treating Alister Staver/Extender: [name] [name]: 2 Encounter Discharge Information Items Discharge Condition: Stable Ambulatory Status:  Cane Discharge Destination: Home Transportation: Private Auto Accompanied By: self Schedule Follow-up Appointment: Yes Clinical Summary of Care: Patient Declined Electronic Signature(s) Signed: 04/29/2022 6:30:20 PM By: Dellie Catholic RN Entered By: Dellie Catholic on 04/29/2022 17:05:59 -------------------------------------------------------------------------------- Lower Extremity Assessment Details Patient Name: Date of Service: [name] [facility] 04/29/2022 9:30 A M Medical Record Number: [id number] Patient Account Number: [id number] Date of Birth/Sex: Treating RN: [birth year] (66 y.o. [name] Primary Care Tarhonda Hollenberg: [name] Other Clinician: Referring Pranav Lince: Treating Karys Meckley/Extender: [name] [name]: 2 Edema Assessment Assessed: [Left: No] [Right: No] E[Left: dema] [Right: :] Calf Left: Right: Point of Measurement: From Medial Instep 44 cm Ankle Left: Right: Point of Measurement: From Medial Instep 22.5 cm Electronic Signature(s) Signed: 04/29/2022 6:30:20 PM By: Dellie Catholic RN Entered By: Dellie Catholic on 04/29/2022 09:40:20 -------------------------------------------------------------------------------- Multi Wound Chart Details Patient Name: Date of Service: [name], RO [facility] 04/29/2022 9:30 A M Medical Record Number: [id number] Patient Account Number: [id number] Date of Birth/Sex: Treating RN: [birth year] (66 y.o. [name] Primary Care Dartanian Knaggs: [name] Other Clinician: Referring Milania Haubner: Treating Hannahmarie Asberry/Extender: [name] [name]: 2 Vital Signs Height(in): 53 Pulse(bpm): 74 Weight(lbs): 235 Blood Pressure(mmHg): 115/74 Body Mass Index(BMI): 39.1 Temperature(F): 98 Respiratory Rate(breaths/min): 18 Photos: [N/A:N/A] Right Metatarsal head fifth Right, Lateral Foot N/A Wound Location: Gradually Appeared Blister N/A Wounding  Event: Lymphedema Venous Leg Ulcer N/A Primary Etiology: Lymphedema, Hypertension, Lymphedema, Hypertension, N/A Comorbid History: Peripheral Venous Disease, Gout, Peripheral Venous Disease, Gout, Rheumatoid Arthritis, Neuropathy Rheumatoid Arthritis, Neuropathy 12/26/2021 04/09/2022 N/A Date Acquired: 14 2 N/A Weeks of Treatment: Open Healed - Epithelialized N/A Wound Status: No No N/A Wound Recurrence: 0.3x0.3x0.1 0x0x0 N/A Measurements L x W x D (cm) 0.071 0 N/A A (cm) : rea 0.007 0 N/A Volume (cm) : 62.20% 100.00% N/A % Reduction in Area: 90.70% 100.00% N/A % Reduction in Volume: Full Thickness Without Exposed Full Thickness Without Exposed N/A Classification:  Support Structures Support Structures Small None Present N/A Exudate Amount: Serosanguineous N/A N/A Exudate Type: red, brown N/A N/A Exudate Color: Distinct, outline attached Flat and Intact N/A Wound Margin: Large (67-100%) None Present (0%) N/A Granulation Amount: Red, Friable N/A N/A Granulation Quality: None Present (0%) None Present (0%) N/A Necrotic Amount: Fat Layer (Subcutaneous Tissue): Yes Fascia: No N/A Exposed Structures: Fascia: No Fat Layer (Subcutaneous Tissue): No Tendon: No Tendon: No Muscle: No Muscle: No Joint: No Joint: No Bone: No Bone: No Medium (34-66%) Large (67-100%) N/A Epithelialization: Treatment Notes Electronic Signature(s) Signed: 04/29/2022 10:07:41 AM By: [name] MD FACS Signed: 04/29/2022 6:30:20 PM By: Dellie Catholic RN Entered By: [name] on 04/29/2022 10:07:41 -------------------------------------------------------------------------------- Multi-Disciplinary Care Plan Details Patient Name: Date of Service: [name], RO [facility] 04/29/2022 9:30 A M Medical Record Number: [id number] Patient Account Number: [id number] Date of Birth/Sex: Treating RN: [birth year] (66 y.o. [name] Primary Care Falan Hensler: [name] Other  Clinician: Referring Cellie Dardis: Treating Isak Sotomayor/Extender: [name] [name]: 2 Multidisciplinary Care Plan reviewed with physician Active Inactive Wound/Skin Impairment Nursing Diagnoses: Impaired tissue integrity Knowledge deficit related to ulceration/compromised skin integrity Goals: Patient/caregiver will verbalize understanding of skin care regimen Date Initiated: 04/12/2022 Target Resolution Date: 07/01/2022 Goal Status: Active Ulcer/skin breakdown will have a volume reduction of 30% by week 4 Date Initiated: 04/12/2022 Target Resolution Date: 07/01/2022 Goal Status: Active Interventions: Assess patient/caregiver ability to obtain necessary supplies Assess patient/caregiver ability to perform ulcer/skin care regimen upon admission and as needed Assess ulceration(s) every visit Treatment Activities: Skin care regimen initiated : 04/12/2022 Topical wound management initiated : 04/12/2022 Notes: Electronic Signature(s) Signed: 04/29/2022 6:30:20 PM By: Dellie Catholic RN Entered By: Dellie Catholic on 04/29/2022 17:03:42 -------------------------------------------------------------------------------- Pain Assessment Details Patient Name: Date of Service: [name], [employer] 04/29/2022 9:30 A M Medical Record Number: [id number] Patient Account Number: [id number] Date of Birth/Sex: Treating RN: 1956/03/19 (66 y.o. [name] Primary Care Dinesha Twiggs: [name] Other Clinician: Referring Nilton Lave: Treating Floreine Kingdon/Extender: [name] [name]: 2 Active Problems Location of Pain Severity and Description of Pain Patient Has Paino No Site Locations Pain Management and Medication Current Pain Management: Electronic Signature(s) Signed: 04/29/2022 6:30:20 PM By: Dellie Catholic RN Entered By: Dellie Catholic on 04/29/2022  09:40:13 -------------------------------------------------------------------------------- Patient/Caregiver Education Details Patient Name: Date of Service: [name], RO [facility] 6/29/2023andnbsp9:30 A M Medical Record Number: [id number] Patient Account Number: [id number] Date of Birth/Gender: Treating RN: Jan 12, 1956 (66 y.o. [name] Primary Care Physician: [name] Other Clinician: Referring Physician: Treating Physician/Extender: [name] [name]: 2 Education Assessment Education Provided To: Patient Education Topics Provided Wound/Skin Impairment: Methods: Explain/Verbal Responses: Return demonstration correctly Electronic Signature(s) Signed: 04/29/2022 6:30:20 PM By: Dellie Catholic RN Entered By: Dellie Catholic on 04/29/2022 17:03:56 -------------------------------------------------------------------------------- Wound Assessment Details Patient Name: Date of Service: [name] [facility] 04/29/2022 9:30 A M Medical Record Number: [id number] Patient Account Number: [id number] Date of Birth/Sex: Treating RN: [birth year] (66 y.o. [name] Primary Care Robertlee Rogacki: [name] Other Clinician: Referring Raiven Belizaire: Treating Rayne Loiseau/Extender: [name] [name]: 2 Wound Status Wound Number: 1 Primary Lymphedema Etiology: Wound Location: Right Metatarsal head fifth Wound Open Wounding Event: Gradually Appeared Status: Date Acquired: 12/26/2021 Comorbid Lymphedema, Hypertension, Peripheral Venous Disease, Gout, Weeks Of Treatment: 14 History: Rheumatoid Arthritis, Neuropathy Clustered Wound: No Photos Wound Measurements Length: (cm) 0.3 Width: (cm) 0.3 Depth: (cm) 0.1 Area: (cm)  0.071 Volume: (cm) 0.007 % Reduction in Area: 62.2% % Reduction in Volume: 90.7% Epithelialization: Medium (34-66%) Tunneling: No Undermining: No Wound Description Classification: Full  Thickness Without Exposed Support Structures Wound Margin: Distinct, outline attached Exudate Amount: Small Exudate Type: Serosanguineous Exudate Color: red, brown Foul Odor After Cleansing: No Slough/Fibrino No Wound Bed Granulation Amount: Large (67-100%) Exposed Structure Granulation Quality: Red, Friable Fascia Exposed: No Necrotic Amount: None Present (0%) Fat Layer (Subcutaneous Tissue) Exposed: Yes Tendon Exposed: No Muscle Exposed: No Joint Exposed: No Bone Exposed: No Treatment Notes Wound #1 (Metatarsal head fifth) Wound Laterality: Right Cleanser Soap and Water Discharge Instruction: May shower and wash wound with dial antibacterial soap and water prior to dressing change. Wound Cleanser Discharge Instruction: Cleanse the wound with wound cleanser prior to applying a clean dressing using gauze sponges, not tissue or cotton balls. Peri-Wound Care Topical Gentamicin Discharge Instruction: apply thin layer to wound bed Primary Dressing KerraCel Ag Gelling Fiber Dressing, 2x2 in (silver alginate) Discharge Instruction: Apply silver alginate to wound bed as instructed Secondary Dressing Optifoam Non-Adhesive Dressing, 4x4 in Discharge Instruction: Apply over primary dressing cut to make foam donut Woven Gauze Sponges 2x2 in Discharge Instruction: Apply over primary dressing as directed. Secured With [employer], 4.5x3.1 (in/yd) Discharge Instruction: Secure with Kerlix as directed. 44M Medipore H Soft Cloth Surgical T ape, 4 x 10 (in/yd) Discharge Instruction: Secure with tape as directed. Compression Wrap Compression Stockings Add-Ons Electronic Signature(s) Signed: 04/29/2022 6:30:20 PM By: Dellie Catholic RN Entered By: Dellie Catholic on 04/29/2022 09:59:55 -------------------------------------------------------------------------------- Wound Assessment Details Patient Name: Date of Service: [name], [place] 04/29/2022 9:30 A M Medical Record  Number: [id number] Patient Account Number: [id number] Date of Birth/Sex: Treating RN: 05-04-1956 (66 y.o. [name] Primary Care Gavyn Ybarra: [name] Other Clinician: Referring Warda Mcqueary: Treating Sumiko Ceasar/Extender: [name] [name]: 2 Wound Status Wound Number: 2 Primary Venous Leg Ulcer Etiology: Wound Location: Right, Lateral Foot Wound Healed - Epithelialized Wounding Event: Blister Status: Date Acquired: 04/09/2022 Comorbid Lymphedema, Hypertension, Peripheral Venous Disease, Gout, Weeks Of Treatment: 2 History: Rheumatoid Arthritis, Neuropathy Clustered Wound: No Photos Wound Measurements Length: (cm) Width: (cm) Depth: (cm) Area: (cm) Volume: (cm) 0 % Reduction in Area: 100% 0 % Reduction in Volume: 100% 0 Epithelialization: Large (67-100%) 0 Tunneling: No 0 Undermining: No Wound Description Classification: Full Thickness Without Exposed Support Structures Wound Margin: Flat and Intact Exudate Amount: None Present Foul Odor After Cleansing: No Slough/Fibrino No Wound Bed Granulation Amount: None Present (0%) Exposed Structure Necrotic Amount: None Present (0%) Fascia Exposed: No Fat Layer (Subcutaneous Tissue) Exposed: No Tendon Exposed: No Muscle Exposed: No Joint Exposed: No Bone Exposed: No Electronic Signature(s) Signed: 04/29/2022 6:30:20 PM By: Dellie Catholic RN Entered By: Dellie Catholic on 04/29/2022 10:06:53 -------------------------------------------------------------------------------- Vitals Details Patient Name: Date of Service: [name], RO [facility] 04/29/2022 9:30 A M Medical Record Number: [id number] Patient Account Number: [id number] Date of Birth/Sex: Treating RN: Sep 08, 1956 (66 y.o. [name] Primary Care Clarabelle Oscarson: [name] Other Clinician: Referring Angla Delahunt: Treating Burnard Enis/Extender: [name] [name]: 2 Vital Signs Time  Taken: 09:40 Temperature (F): 98 Height (in): 65 Pulse (bpm): 68 Weight (lbs): 235 Respiratory Rate (breaths/min): 18 Body Mass Index (BMI): 39.1 Blood Pressure (mmHg): 115/74 Reference Range: 80 - 120 mg / dl Electronic Signature(s) Signed: 04/29/2022 6:30:20 PM By: Dellie Catholic RN Entered By: Dellie Catholic on 04/29/2022 09:40:06

## 2022-04-29 NOTE — Progress Notes (Addendum)
[name], [name] ([id number]) Visit Report for 04/29/2022 Chief Complaint Document Details Patient Name: Date of Service: Evelyn Campbell [facility] 04/29/2022 9:30 A M Medical Record Number: [id number] Patient Account Number: [id number] Date of Birth/Sex: Treating RN: [birth year] (66 y.o. Evelyn Campbell Primary Care Provider: [name] Other Clinician: Referring Provider: Treating Provider/Extender: [name] [name]: 2 Information Obtained from: Patient Chief Complaint Patient seen for complaints of Non-Healing Wound. Electronic Signature(s) Signed: 04/29/2022 10:07:46 AM By: [name] MD FACS Entered By: [name] on 04/29/2022 10:07:46 -------------------------------------------------------------------------------- HPI Details Patient Name: Date of Service: [name], [name] 04/29/2022 9:30 A M Medical Record Number: [id number] Patient Account Number: [id number] Date of Birth/Sex: Treating RN: 04-08-1956 (66 y.o. Evelyn Campbell Primary Care Provider: [name] Other Clinician: Referring Provider: Treating Provider/Extender: [name] [name]: 2 History of Present Illness HPI Description: ADMISSION 01/21/2022 This is a 66 year old woman presenting with an ulcer on the right fifth metatarsal head. She says that it has been present for about 6 weeks. She is not certain how it developed, but she does have peripheral neuropathy. She also has lymphedema, hypertension, metabolic syndrome/prediabetes, but does not carry an actual diagnosis of diabetes nor is she on any medications for this. 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 ABI in clinic today was 0.8. She has been treated in the past for lymphedema, but has been unable to tolerate compression. She does have lymphedema pumps but does not use these regularly. She had been applying Neosporin and new  skin to her wound, but it did not really change much. She was referred here by her primary care provider for further evaluation and management. 01/28/2022: 1 week follow-up. Apparently there was some miscommunication with home health and they did not come out to help her with her dressing changes. The patient and her husband have been doing them themselves. The wound actually looks quite good, with decreased dimensions and a very nice healthy surface. 02/05/2022: The home health nurse came out during the week and the patient was happy with the care. The wound is smaller today with a clean base. 02/12/2022: The wound continues to contract and is quite small today. There is a bit of callus buildup, but the wound surface itself is very clean. 02/19/2022: The wound is nearly closed. There is a small amount of callus buildup. No slough or drainage. 02/25/2022: The wound is down to just a sliver. She continues to build up callus. No concern for infection. 03/04/2022: The wound is closed. READMISSION 04/12/2022 Last week, the patient noticed a spot of blood on her stocking where her wound had been. She noted that the wound was open. She began applying the Prisma silver collagen that we had used during her last admission. She wrapped it up and on Friday when she changed the dressing, she noticed that there was a new wound on the dorsal part of her right foot overlying the fifth metatarsal. Today it is quite red and tender. There is slough on the surface. The wound on the plantar surface has periwound callus accumulation but otherwise appears fairly clean. 04/19/2022: The culture that we took last week somehow has been lost and we have no results. The patient has been applying topical gentamicin and silver alginate to her wound, however and both of them looks smaller. The redness and tenderness has improved significantly. Her foot remains macerated in appearance, however. She had some  bleeding from the plantar wound.  On inspection, it looks like secondary to moisture, the surrounding skin and callus have lifted. When I probed with a thin cotton applicator, this created some minor bleeding and I think this is likely what happened. The tissue is just quite friable. 04/22/2022: The culture data were finally found and she did grow Proteus mirabilis from her wound. I prescribed Bactrim for her. She is here today for her first cast change. Both wound sites look cleaner and smaller today even after just a few days of casting. 04/26/2022: Both wounds are smaller and very clean. She is completing a course of Bactrim for Proteus mirabilis grown from her wound. 04/29/2022: The patient contacted our office yesterday concerned that the cast was pushing too hard against her great toe. She is here today to have the cast removed and a new one reapplied. The wound on the dorsal lateral part of her foot is closed. The wound on the plantar surface of the fifth metatarsal head is smaller and epithelializing. Electronic Signature(s) Signed: 04/29/2022 10:08:53 AM By: [name] MD FACS Entered By: [name] on 04/29/2022 10:08:53 -------------------------------------------------------------------------------- Physical Exam Details Patient Name: Date of Service: [name], [name] 04/29/2022 9:30 A M Medical Record Number: [id number] Patient Account Number: [id number] Date of Birth/Sex: Treating RN: 05/17/1956 (66 y.o. Evelyn Campbell Primary Care Provider: [name] Other Clinician: Referring Provider: Treating Provider/Extender: [name] [name]: 2 Constitutional . . . . No acute distress.. Notes 04/29/2022: The wound on the dorsal lateral part of her foot is closed. The wound on the plantar surface of the fifth metatarsal head is smaller and epithelializing. Electronic Signature(s) Signed: 04/29/2022 10:09:21 AM By: [name] MD FACS Entered By: [name]  on 04/29/2022 10:09:21 -------------------------------------------------------------------------------- Physician Orders Details Patient Name: Date of Service: [name], [name] 04/29/2022 9:30 A M Medical Record Number: [id number] Patient Account Number: [id number] Date of Birth/Sex: Treating RN: Mar 22, 1956 (66 y.o. Valarie Cones, [name] Primary Care Provider: [name] Other Clinician: Referring Provider: Treating Provider/Extender: [name] [name]: 2 Verbal / Phone Orders: No Diagnosis Coding ICD-10 Coding Code Description [phone] Non-pressure chronic ulcer of other part of right foot with fat layer exposed I87.2 Venous insufficiency (chronic) (peripheral) G60.9 Hereditary and idiopathic neuropathy, unspecified R73.03 Prediabetes Follow-up Appointments ppointment in 1 week. - Dr. Celine Ahr -Rm 1 Return A Wed. 7/5 @ 3:30 pm Bathing/ Shower/ Hygiene May shower with protection but do not get wound dressing(s) wet. - Please use cast protector (Can purchase cast protector from local pharmacy, such as [facility] or [facility]) Edema Control - Lymphedema / SCD / Other Elevate legs to the level of the heart or above for 30 minutes daily and/or when sitting, a frequency of: Avoid standing for long periods of time. Patient to wear own compression stockings every day. - L left Exercise regularly Off-Loading Wound #1 Right Metatarsal head fifth Total Contact Cast to Right Lower Extremity Wound Treatment Wound #1 - Metatarsal head fifth Wound Laterality: Right Cleanser: Soap and Water 1 x Per Week/10 Days Discharge Instructions: May shower and wash wound with dial antibacterial soap and water prior to dressing change. Cleanser: Wound Cleanser (Generic) 1 x Per Week/10 Days Discharge Instructions: Cleanse the wound with wound cleanser prior to applying a clean dressing using gauze sponges, not tissue or cotton balls. Topical: Gentamicin 1 x Per Week/10  Days Discharge Instructions: apply thin layer to wound bed Prim Dressing: Paulina Fusi  Ag Gelling Fiber Dressing, 2x2 in (silver alginate) (Generic) 1 x Per Week/10 Days ary Discharge Instructions: Apply silver alginate to wound bed as instructed Secondary Dressing: Optifoam Non-Adhesive Dressing, 4x4 in (Generic) 1 x Per Week/10 Days Discharge Instructions: Apply over primary dressing cut to make foam donut Secondary Dressing: Woven Gauze Sponges 2x2 in (Generic) 1 x Per Week/10 Days Discharge Instructions: Apply over primary dressing as directed. Secured With: [employer], 4.5x3.1 (in/yd) (Generic) 1 x Per Week/10 Days Discharge Instructions: Secure with Kerlix as directed. Secured With: 55M Medipore H Soft Cloth Surgical T ape, 4 x 10 (in/yd) (Generic) 1 x Per Week/10 Days Discharge Instructions: Secure with tape as directed. Electronic Signature(s) Signed: 04/29/2022 11:42:33 AM By: [name] MD FACS Signed: 04/29/2022 6:30:20 PM By: Dellie Catholic RN Entered By: Dellie Catholic on 04/29/2022 10:45:49 -------------------------------------------------------------------------------- Problem List Details Patient Name: Date of Service: [name], [place] 04/29/2022 9:30 A M Medical Record Number: [id number] Patient Account Number: [id number] Date of Birth/Sex: Treating RN: Mar 17, 1956 (66 y.o. Evelyn Campbell Primary Care Provider: [name] Other Clinician: Referring Provider: Treating Provider/Extender: [name] [name]: 2 Active Problems ICD-10 Encounter Code Description Active Date MDM Diagnosis [phone] Non-pressure chronic ulcer of other part of right foot with fat layer exposed 04/12/2022 No Yes I87.2 Venous insufficiency (chronic) (peripheral) 04/12/2022 No Yes G60.9 Hereditary and idiopathic neuropathy, unspecified 04/12/2022 No Yes R73.03 Prediabetes 04/12/2022 No Yes Inactive Problems Resolved Problems Electronic  Signature(s) Signed: 04/29/2022 10:07:13 AM By: [name] MD FACS Entered By: [name] on 04/29/2022 10:07:12 -------------------------------------------------------------------------------- Progress Note Details Patient Name: Date of Service: [place] ND, [place] 04/29/2022 9:30 A M Medical Record Number: [id number] Patient Account Number: [id number] Date of Birth/Sex: Treating RN: [birth year] (66 y.o. Evelyn Campbell Primary Care Provider: [name] Other Clinician: Referring Provider: Treating Provider/Extender: [name] [name]: 2 Subjective Chief Complaint Information obtained from Patient Patient seen for complaints of Non-Healing Wound. History of Present Illness (HPI) ADMISSION 01/21/2022 This is a 66 year old woman presenting with an ulcer on the right fifth metatarsal head. She says that it has been present for about 6 weeks. She is not certain how it developed, but she does have peripheral neuropathy. She also has lymphedema, hypertension, metabolic syndrome/prediabetes, but does not carry an actual diagnosis of diabetes nor is she on any medications for this. 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 ABI in clinic today was 0.8. She has been treated in the past for lymphedema, but has been unable to tolerate compression. She does have lymphedema pumps but does not use these regularly. She had been applying Neosporin and new skin to her wound, but it did not really change much. She was referred here by her primary care provider for further evaluation and management. 01/28/2022: 1 week follow-up. Apparently there was some miscommunication with home health and they did not come out to help her with her dressing changes. The patient and her husband have been doing them themselves. The wound actually looks quite good, with decreased dimensions and a very nice healthy surface. 02/05/2022:  The home health nurse came out during the week and the patient was happy with the care. The wound is smaller today with a clean base. 02/12/2022: The wound continues to contract and is quite small today. There is a bit of callus buildup, but the wound surface itself is very clean. 02/19/2022: The wound is nearly closed.  There is a small amount of callus buildup. No slough or drainage. 02/25/2022: The wound is down to just a sliver. She continues to build up callus. No concern for infection. 03/04/2022: The wound is closed. READMISSION 04/12/2022 Last week, the patient noticed a spot of blood on her stocking where her wound had been. She noted that the wound was open. She began applying the Prisma silver collagen that we had used during her last admission. She wrapped it up and on Friday when she changed the dressing, she noticed that there was a new wound on the dorsal part of her right foot overlying the fifth metatarsal. Today it is quite red and tender. There is slough on the surface. The wound on the plantar surface has periwound callus accumulation but otherwise appears fairly clean. 04/19/2022: The culture that we took last week somehow has been lost and we have no results. The patient has been applying topical gentamicin and silver alginate to her wound, however and both of them looks smaller. The redness and tenderness has improved significantly. Her foot remains macerated in appearance, however. She had some bleeding from the plantar wound. On inspection, it looks like secondary to moisture, the surrounding skin and callus have lifted. When I probed with a thin cotton applicator, this created some minor bleeding and I think this is likely what happened. The tissue is just quite friable. 04/22/2022: The culture data were finally found and she did grow Proteus mirabilis from her wound. I prescribed Bactrim for her. She is here today for her first cast change. Both wound sites look cleaner and smaller  today even after just a few days of casting. 04/26/2022: Both wounds are smaller and very clean. She is completing a course of Bactrim for Proteus mirabilis grown from her wound. 04/29/2022: The patient contacted our office yesterday concerned that the cast was pushing too hard against her great toe. She is here today to have the cast removed and a new one reapplied. The wound on the dorsal lateral part of her foot is closed. The wound on the plantar surface of the fifth metatarsal head is smaller and epithelializing. Patient History Information obtained from Patient. Family History Heart Disease - Father, No family history of Cancer, Diabetes, Hereditary Spherocytosis, Hypertension, Kidney Disease, Lung Disease, Seizures, Stroke, Thyroid Problems, Tuberculosis. Social History Never smoker, Marital Status - Married, Alcohol Use - Never, Drug Use - No History, Caffeine Use - Never. Medical History Hematologic/Lymphatic Patient has history of Lymphedema Cardiovascular Patient has history of Hypertension, Peripheral Venous Disease Musculoskeletal Patient has history of Gout, Rheumatoid Arthritis - ankle Neurologic Patient has history of Neuropathy - Bilateral feet Hospitalization/Surgery History - Hernia repair (2016);Right ankle reconstruction. - insertion of mesh. - pancreatectomy. - toe surgery bilateral. - cholecystectomy. - abdominal hysterectomy. - tonsillectomy. - wrist surgery. Medical A Surgical History Notes nd Constitutional Symptoms (General Health) obesity Hematologic/Lymphatic Gout, vitamin D deficiency Cardiovascular PEriperal venous insufficiency Gastrointestinal Diverticular disease of colon: GERD, fatty liver Endocrine Prediabetes Neurologic Idiopathic peripheral neuropathy Objective Constitutional No acute distress.. Vitals Time Taken: 9:40 AM, Height: 65 in, Weight: 235 lbs, BMI: 39.1, Temperature: 98 F, Pulse: 68 bpm, Respiratory Rate: 18 breaths/min, Blood  Pressure: 115/74 mmHg. General Notes: 04/29/2022: The wound on the dorsal lateral part of her foot is closed. The wound on the plantar surface of the fifth metatarsal head is smaller and epithelializing. Integumentary (Hair, Skin) Wound #1 status is Open. Original cause of wound was Gradually Appeared. The date acquired was: 12/26/2021. The  wound has been in treatment 14 weeks. The wound is located on the Right Metatarsal head fifth. The wound measures 0.3cm length x 0.3cm width x 0.1cm depth; 0.071cm^2 area and 0.007cm^3 volume. There is Fat Layer (Subcutaneous Tissue) exposed. There is no tunneling or undermining noted. There is a small amount of serosanguineous drainage noted. The wound margin is distinct with the outline attached to the wound base. There is large (67-100%) red, friable granulation within the wound bed. There is no necrotic tissue within the wound bed. Wound #2 status is Healed - Epithelialized. Original cause of wound was Blister. The date acquired was: 04/09/2022. The wound has been in treatment 2 weeks. The wound is located on the Right,Lateral Foot. The wound measures 0cm length x 0cm width x 0cm depth; 0cm^2 area and 0cm^3 volume. There is no tunneling or undermining noted. There is a none present amount of drainage noted. The wound margin is flat and intact. There is no granulation within the wound bed. There is no necrotic tissue within the wound bed. Assessment Active Problems ICD-10 Non-pressure chronic ulcer of other part of right foot with fat layer exposed Venous insufficiency (chronic) (peripheral) Hereditary and idiopathic neuropathy, unspecified Prediabetes Plan 04/29/2022: The wound on the dorsal lateral part of her foot is closed. The wound on the plantar surface of the fifth metatarsal head is smaller and epithelializing. No debridement was necessary today. We will reapply her total contact cast taking care to leave ample room and extra padding at the great  toe. Follow-up as previously scheduled. Electronic Signature(s) Signed: 04/29/2022 10:10:02 AM By: [name] MD FACS Entered By: [name] on 04/29/2022 10:10:02 -------------------------------------------------------------------------------- HxROS Details Patient Name: Date of Service: [name], [place] 04/29/2022 9:30 A M Medical Record Number: [id number] Patient Account Number: [id number] Date of Birth/Sex: Treating RN: 02-09-1956 (66 y.o. Evelyn Campbell Primary Care Provider: [name] Other Clinician: Referring Provider: Treating Provider/Extender: [name] [name]: 2 Information Obtained From Patient Constitutional Symptoms (General Health) Medical History: Past Medical History Notes: obesity Hematologic/Lymphatic Medical History: Positive for: Lymphedema Past Medical History Notes: Gout, vitamin D deficiency Cardiovascular Medical History: Positive for: Hypertension; Peripheral Venous Disease Past Medical History Notes: PEriperal venous insufficiency Gastrointestinal Medical History: Past Medical History Notes: Diverticular disease of colon: GERD, fatty liver Endocrine Medical History: Past Medical History Notes: Prediabetes Musculoskeletal Medical History: Positive for: Gout; Rheumatoid Arthritis - ankle Neurologic Medical History: Positive for: Neuropathy - Bilateral feet Past Medical History Notes: Idiopathic peripheral neuropathy Immunizations Pneumococcal Vaccine: Received Pneumococcal Vaccination: Yes Received Pneumococcal Vaccination On or After 60th Birthday: Yes Implantable Devices None Hospitalization / Surgery History Type of Hospitalization/Surgery Hernia repair (2016);Right ankle reconstruction insertion of mesh pancreatectomy toe surgery bilateral cholecystectomy abdominal hysterectomy tonsillectomy wrist surgery Family and Social History Cancer: No; Diabetes: No; Heart  Disease: Yes - Father; Hereditary Spherocytosis: No; Hypertension: No; Kidney Disease: No; Lung Disease: No; Seizures: No; Stroke: No; Thyroid Problems: No; Tuberculosis: No; Never smoker; Marital Status - Married; Alcohol Use: Never; Drug Use: No History; Caffeine Use: Never; Financial Concerns: No; Food, Clothing or Shelter Needs: No; Support System Lacking: No; Transportation Concerns: No Electronic Signature(s) Signed: 04/29/2022 11:42:33 AM By: [name] MD FACS Signed: 04/29/2022 6:30:20 PM By: Dellie Catholic RN Entered By: [name] on 04/29/2022 10:09:00 -------------------------------------------------------------------------------- Total Contact Cast Details Patient Name: Date of Service: [name], RO [facility] 04/29/2022 9:30 A M Medical Record Number: [id number] Patient Account Number: [id number] Date of Birth/Sex: Treating RN: 1956/01/17 (  66 y.o. Evelyn Campbell Primary Care Provider: [name] Other Clinician: Referring Provider: Treating Provider/Extender: [name] [name]: 2 T Contact Cast Applied for Wound Assessment: otal Wound #1 Right Metatarsal head fifth Performed By: Physician [name], MD Post Procedure Diagnosis Same as Pre-procedure Electronic Signature(s) Signed: 04/29/2022 6:30:20 PM By: Dellie Catholic RN Signed: 04/30/2022 7:42:03 AM By: [name] MD FACS Entered By: Dellie Catholic on 04/29/2022 17:03:00 -------------------------------------------------------------------------------- SuperBill Details Patient Name: Date of Service: [name], RO [facility] 04/29/2022 Medical Record Number: [id number] Patient Account Number: [id number] Date of Birth/Sex: Treating RN: July 21, 1956 (66 y.o. Evelyn Campbell Primary Care Provider: [name] Other Clinician: Referring Provider: Treating Provider/Extender: [name] [name]: 2 Diagnosis  Coding ICD-10 Codes Code Description [phone] Non-pressure chronic ulcer of other part of right foot with fat layer exposed I87.2 Venous insufficiency (chronic) (peripheral) G60.9 Hereditary and idiopathic neuropathy, unspecified R73.03 Prediabetes Facility Procedures CPT4 Code: [id number] Description: [phone] - APPLY TOTAL CONTACT LEG CAST ICD-10 Diagnosis Description L97.512 Non-pressure chronic ulcer of other part of right foot with fat layer exposed Modifier: Quantity: 1 Physician Procedures : CPT4 Code Description Modifier [id number] - WC PHYS LEVEL 3 - EST PT ICD-10 Diagnosis Description L97.512 Non-pressure chronic ulcer of other part of right foot with fat layer exposed I87.2 Venous insufficiency (chronic) (peripheral) G60.9  Hereditary and idiopathic neuropathy, unspecified R73.03 Prediabetes Quantity: 1 : [id number] - WC PHYS APPLY TOTAL CONTACT CAST ICD-10 Diagnosis Description L97.512 Non-pressure chronic ulcer of other part of right foot with fat layer exposed Quantity: 1 Electronic Signature(s) Signed: 04/29/2022 10:10:22 AM By: [name] MD FACS Entered By: [name] on 04/29/2022 10:10:22

## 2022-05-05 ENCOUNTER — Encounter (HOSPITAL_BASED_OUTPATIENT_CLINIC_OR_DEPARTMENT_OTHER): Payer: Medicare Other | Admitting: General Surgery

## 2022-05-06 ENCOUNTER — Encounter (HOSPITAL_BASED_OUTPATIENT_CLINIC_OR_DEPARTMENT_OTHER): Payer: Medicare Other | Attending: General Surgery | Admitting: General Surgery

## 2022-05-06 DIAGNOSIS — K760 Fatty (change of) liver, not elsewhere classified: Secondary | ICD-10-CM | POA: Insufficient documentation

## 2022-05-06 DIAGNOSIS — I890 Lymphedema, not elsewhere classified: Secondary | ICD-10-CM | POA: Insufficient documentation

## 2022-05-06 DIAGNOSIS — E8881 Metabolic syndrome: Secondary | ICD-10-CM | POA: Insufficient documentation

## 2022-05-06 DIAGNOSIS — I872 Venous insufficiency (chronic) (peripheral): Secondary | ICD-10-CM | POA: Insufficient documentation

## 2022-05-06 DIAGNOSIS — L97512 Non-pressure chronic ulcer of other part of right foot with fat layer exposed: Secondary | ICD-10-CM | POA: Diagnosis not present

## 2022-05-06 DIAGNOSIS — M069 Rheumatoid arthritis, unspecified: Secondary | ICD-10-CM | POA: Diagnosis not present

## 2022-05-06 NOTE — Progress Notes (Signed)
BELIA, FEBO ([id number]) Visit Report for 05/06/2022 Arrival Information Details Patient Name: Date of Service: Evelyn Campbell [facility] 05/06/2022 12:30 PM Medical Record Number: [id number] Patient Account Number: [id number] Date of Birth/Sex: Treating RN: [birth year] (66 y.o. Valarie Cones, [name] Primary Care Esraa Seres: [name] Other Clinician: Referring Ayman Brull: Treating Kaeson Kleinert/Extender: [name] [name]: 3 Visit Information History Since Last Visit Added or deleted any medications: No Patient Arrived: [name] Any new allergies or adverse reactions: No Arrival Time: 12:51 Had a fall or experienced change in No Accompanied By: self activities of daily living that may affect Transfer Assistance: None risk of falls: Patient Requires Transmission-Based Precautions: No Signs or symptoms of abuse/neglect since last visito No Patient Has Alerts: Yes Hospitalized since last visit: No Patient Alerts: Patient on Blood Thinner Implantable device outside of the clinic excluding No cellular tissue based products placed in the center since last visit: Has Dressing in Place as Prescribed: Yes Pain Present Now: No Electronic Signature(s) Signed: 05/06/2022 4:32:49 PM By: Dellie Catholic RN Entered By: Dellie Catholic on 05/06/2022 12:52:04 -------------------------------------------------------------------------------- Clinic Level of Care Assessment Details Patient Name: Date of Service: Evelyn Campbell [facility] 05/06/2022 12:30 PM Medical Record Number: [id number] Patient Account Number: [id number] Date of Birth/Sex: Treating RN: 1955-12-01 (66 y.o. [name] Primary Care Christabelle Hanzlik: [name] Other Clinician: Referring Renaye Janicki: Treating Blakely Gluth/Extender: [name] [name]: 3 Clinic Level of Care Assessment Items TOOL 4 Quantity Score X- 1 0 Use when only an EandM is performed on FOLLOW-UP  visit ASSESSMENTS - Nursing Assessment / Reassessment X- 1 10 Reassessment of Co-morbidities (includes updates in patient status) X- 1 5 Reassessment of Adherence to Treatment Plan ASSESSMENTS - Wound and Skin A ssessment / Reassessment X - Simple Wound Assessment / Reassessment - one wound 1 5 '[]'$  - 0 Complex Wound Assessment / Reassessment - multiple wounds '[]'$  - 0 Dermatologic / Skin Assessment (not related to wound area) ASSESSMENTS - Focused Assessment '[]'$  - 0 Circumferential Edema Measurements - multi extremities '[]'$  - 0 Nutritional Assessment / Counseling / Intervention '[]'$  - 0 Lower Extremity Assessment (monofilament, tuning fork, pulses) '[]'$  - 0 Peripheral Arterial Disease Assessment (using hand held doppler) ASSESSMENTS - Ostomy and/or Continence Assessment and Care '[]'$  - 0 Incontinence Assessment and Management '[]'$  - 0 Ostomy Care Assessment and Management (repouching, etc.) PROCESS - Coordination of Care X - Simple Patient / Family Education for ongoing care 1 15 '[]'$  - 0 Complex (extensive) Patient / Family Education for ongoing care X- 1 10 Staff obtains Programmer, systems, Records, T Results / Process Orders est X- 1 10 Staff telephones HHA, Nursing Homes / Clarify orders / etc '[]'$  - 0 Routine Transfer to another Facility (non-emergent condition) '[]'$  - 0 Routine Hospital Admission (non-emergent condition) '[]'$  - 0 New Admissions / Biomedical engineer / Ordering NPWT Apligraf, etc. , '[]'$  - 0 Emergency Hospital Admission (emergent condition) X- 1 10 Simple Discharge Coordination '[]'$  - 0 Complex (extensive) Discharge Coordination PROCESS - Special Needs '[]'$  - 0 Pediatric / Minor Patient Management '[]'$  - 0 Isolation Patient Management '[]'$  - 0 Hearing / Language / Visual special needs '[]'$  - 0 Assessment of Community assistance (transportation, D/C planning, etc.) '[]'$  - 0 Additional assistance / Altered mentation '[]'$  - 0 Support Surface(s) Assessment (bed, cushion, seat,  etc.) INTERVENTIONS - Wound Cleansing / Measurement X - Simple Wound Cleansing - one wound 1 5 '[]'$  - 0 Complex Wound Cleansing - multiple wounds X-  1 5 Wound Imaging (photographs - any number of wounds) '[]'$  - 0 Wound Tracing (instead of photographs) X- 1 5 Simple Wound Measurement - one wound '[]'$  - 0 Complex Wound Measurement - multiple wounds INTERVENTIONS - Wound Dressings '[]'$  - 0 Small Wound Dressing one or multiple wounds '[]'$  - 0 Medium Wound Dressing one or multiple wounds '[]'$  - 0 Large Wound Dressing one or multiple wounds '[]'$  - 0 Application of Medications - topical '[]'$  - 0 Application of Medications - injection INTERVENTIONS - Miscellaneous '[]'$  - 0 External ear exam '[]'$  - 0 Specimen Collection (cultures, biopsies, blood, body fluids, etc.) '[]'$  - 0 Specimen(s) / Culture(s) sent or taken to Lab for analysis '[]'$  - 0 Patient Transfer (multiple staff / Civil Service fast streamer / Similar devices) '[]'$  - 0 Simple Staple / Suture removal (25 or less) '[]'$  - 0 Complex Staple / Suture removal (26 or more) '[]'$  - 0 Hypo / Hyperglycemic Management (close monitor of Blood Glucose) '[]'$  - 0 Ankle / Brachial Index (ABI) - do not check if billed separately X- 1 5 Vital Signs Has the patient been seen at the hospital within the last three years: Yes Total Score: 85 Level Of Care: New/Established - Level 3 Electronic Signature(s) Signed: 05/06/2022 4:32:49 PM By: Dellie Catholic RN Entered By: Dellie Catholic on 05/06/2022 16:19:50 -------------------------------------------------------------------------------- Encounter Discharge Information Details Patient Name: Date of Service: [name], RO [facility] 05/06/2022 12:30 PM Medical Record Number: [id number] Patient Account Number: [id number] Date of Birth/Sex: Treating RN: 01-20-1956 (66 y.o. [name] Primary Care Julen Rubert: [name] Other Clinician: Referring Johnie Makki: Treating Arienna Benegas/Extender: [name] Weeks in  Treatment: 3 Encounter Discharge Information Items Discharge Condition: Stable Ambulatory Status: Cane Discharge Destination: Home Transportation: Private Auto Accompanied By: self Schedule Follow-up Appointment: Yes Clinical Summary of Care: Patient Declined Electronic Signature(s) Signed: 05/06/2022 4:32:49 PM By: Dellie Catholic RN Entered By: Dellie Catholic on 05/06/2022 16:22:45 -------------------------------------------------------------------------------- Lower Extremity Assessment Details Patient Name: Date of Service: [name], RO [facility] 05/06/2022 12:30 PM Medical Record Number: [id number] Patient Account Number: [id number] Date of Birth/Sex: Treating RN: 1956/01/12 (66 y.o. [name] Primary Care Nat Lowenthal: [name] Other Clinician: Referring Yexalen Deike: Treating Misty Rago/Extender: [name] [name]: 3 Edema Assessment Assessed: [Left: No] [Right: No] [Left: Edema] [Right: :] Calf Left: Right: Point of Measurement: From Medial Instep 44 cm Ankle Left: Right: Point of Measurement: From Medial Instep 22.5 cm Electronic Signature(s) Signed: 05/06/2022 4:32:49 PM By: Dellie Catholic RN Entered By: Dellie Catholic on 05/06/2022 12:56:29 -------------------------------------------------------------------------------- Multi Wound Chart Details Patient Name: Date of Service: [name], RO [facility] 05/06/2022 12:30 PM Medical Record Number: [id number] Patient Account Number: [id number] Date of Birth/Sex: Treating RN: 1956/01/09 (66 y.o. [name] Primary Care Murle Otting: [name] Other Clinician: Referring Jc Veron: Treating Japji Kok/Extender: [name] [name]: 3 Vital Signs Height(in): 65 Pulse(bpm): 82 Weight(lbs): 235 Blood Pressure(mmHg): 112/73 Body Mass Index(BMI): 39.1 Temperature(F): 98 Respiratory Rate(breaths/min): 18 Photos: [N/A:N/A] Right Metatarsal head  fifth N/A N/A Wound Location: Gradually Appeared N/A N/A Wounding Event: Lymphedema N/A N/A Primary Etiology: Lymphedema, Hypertension, N/A N/A Comorbid History: Peripheral Venous Disease, Gout, Rheumatoid Arthritis, Neuropathy 12/26/2021 N/A N/A Date Acquired: 15 N/A N/A Weeks of Treatment: Healed - Epithelialized N/A N/A Wound Status: No N/A N/A Wound Recurrence: 0x0x0 N/A N/A Measurements L x W x D (cm) 0 N/A N/A A (cm) : rea 0 N/A N/A Volume (cm) : 100.00% N/A N/A %  Reduction in Area: 100.00% N/A N/A % Reduction in Volume: Full Thickness Without Exposed N/A N/A Classification: Support Structures None Present N/A N/A Exudate Amount: Distinct, outline attached N/A N/A Wound Margin: None Present (0%) N/A N/A Granulation Amount: None Present (0%) N/A N/A Necrotic Amount: Fascia: No N/A N/A Exposed Structures: Fat Layer (Subcutaneous Tissue): No Tendon: No Muscle: No Joint: No Bone: No Large (67-100%) N/A N/A Epithelialization: Treatment Notes Electronic Signature(s) Signed: 05/06/2022 1:36:44 PM By: [name] MD FACS Signed: 05/06/2022 4:32:49 PM By: Dellie Catholic RN Entered By: [name] on 05/06/2022 13:36:44 -------------------------------------------------------------------------------- Multi-Disciplinary Care Plan Details Patient Name: Date of Service: [name], RO [facility] 05/06/2022 12:30 PM Medical Record Number: [id number] Patient Account Number: [id number] Date of Birth/Sex: Treating RN: [birth year] (66 y.o. [name] Primary Care Lakera Viall: [name] Other Clinician: Referring Chevonne Bostrom: Treating Olusegun Gerstenberger/Extender: [name] [name]: 3 Multidisciplinary Care Plan reviewed with physician Active Inactive Electronic Signature(s) Signed: 05/06/2022 4:32:49 PM By: Dellie Catholic RN Entered By: Dellie Catholic on 05/06/2022  16:17:46 -------------------------------------------------------------------------------- Pain Assessment Details Patient Name: Date of Service: Evelyn Campbell [facility] 05/06/2022 12:30 PM Medical Record Number: [id number] Patient Account Number: [id number] Date of Birth/Sex: Treating RN: 06-17-1956 (66 y.o. [name] Primary Care Kushal Saunders: [name] Other Clinician: Referring Riyana Biel: Treating Svara Twyman/Extender: [name] [name]: 3 Active Problems Location of Pain Severity and Description of Pain Patient Has Paino No Site Locations Pain Management and Medication Current Pain Management: Electronic Signature(s) Signed: 05/06/2022 4:32:49 PM By: Dellie Catholic RN Entered By: Dellie Catholic on 05/06/2022 12:56:25 -------------------------------------------------------------------------------- Patient/Caregiver Education Details Patient Name: Date of Service: Modena Morrow, RO [facility] 7/6/2023andnbsp12:30 PM Medical Record Number: [id number] Patient Account Number: [id number] Date of Birth/Gender: Treating RN: [birth year] (66 y.o. [name] Primary Care Physician: [name] Other Clinician: Referring Physician: Treating Physician/Extender: [name] [name]: 3 Education Assessment Education Provided To: Patient Education Topics Provided Wound/Skin Impairment: Methods: Explain/Verbal Responses: Return demonstration correctly Electronic Signature(s) Signed: 05/06/2022 4:32:49 PM By: Dellie Catholic RN Entered By: Dellie Catholic on 05/06/2022 16:17:29 -------------------------------------------------------------------------------- Wound Assessment Details Patient Name: Date of Service: [name], RO [facility] 05/06/2022 12:30 PM Medical Record Number: [id number] Patient Account Number: [id number] Date of Birth/Sex: Treating RN: 1956-05-10 (66 y.o. [name] Primary Care Marynell Bies:  [name] Other Clinician: Referring Sandhya Denherder: Treating [place] Oborn/Extender: [name] [name]: 3 Wound Status Wound Number: 1 Primary Lymphedema Etiology: Wound Location: Right Metatarsal head fifth Wound Healed - Epithelialized Wounding Event: Gradually Appeared Status: Date Acquired: 12/26/2021 Comorbid Lymphedema, Hypertension, Peripheral Venous Disease, Gout, Weeks Of Treatment: 15 History: Rheumatoid Arthritis, Neuropathy Clustered Wound: No Photos Wound Measurements Length: (cm) Width: (cm) Depth: (cm) Area: (cm) Volume: (cm) 0 % Reduction in Area: 100% 0 % Reduction in Volume: 100% 0 Epithelialization: Large (67-100%) 0 Tunneling: No 0 Undermining: No Wound Description Classification: Full Thickness Without Exposed Support Structures Wound Margin: Distinct, outline attached Exudate Amount: None Present Foul Odor After Cleansing: No Slough/Fibrino Yes Wound Bed Granulation Amount: None Present (0%) Exposed Structure Necrotic Amount: None Present (0%) Fascia Exposed: No Fat Layer (Subcutaneous Tissue) Exposed: No Tendon Exposed: No Muscle Exposed: No Joint Exposed: No Bone Exposed: No Electronic Signature(s) Signed: 05/06/2022 4:32:49 PM By: Dellie Catholic RN Entered By: Dellie Catholic on 05/06/2022 13:23:22 -------------------------------------------------------------------------------- Vitals Details Patient Name: Date of Service: [name], RO [facility] 05/06/2022 12:30 PM Medical Record Number: [id number] Patient Account  Number: [id number] Date of Birth/Sex: Treating RN: 09/04/1956 (66 y.o. [name] Primary Care Delcenia Inman: [name] Other Clinician: Referring Darrelle Barrell: Treating Onalee Steinbach/Extender: [name] [name]: 3 Vital Signs Time Taken: 12:49 Temperature (F): 98 Height (in): 65 Pulse (bpm): 63 Weight (lbs): 235 Respiratory Rate (breaths/min): 18 Body  Mass Index (BMI): 39.1 Blood Pressure (mmHg): 112/73 Reference Range: 80 - 120 mg / dl Electronic Signature(s) Signed: 05/06/2022 4:32:49 PM By: Dellie Catholic RN Entered By: Dellie Catholic on 05/06/2022 12:57:24

## 2022-05-07 NOTE — Progress Notes (Signed)
Evelyn, Campbell ([id number]) Visit Report for 05/06/2022 Chief Complaint Document Details Patient Name: Date of Service: Evelyn Campbell [facility] 05/06/2022 12:30 PM Medical Record Number: [id number] Patient Account Number: [id number] Date of Birth/Sex: Treating RN: 1956/07/03 (66 y.o. Evelyn Campbell Primary Care Provider: [name] Other Clinician: Referring Provider: Treating Provider/Extender: [name] [name]: 3 Information Obtained from: Patient Chief Complaint Patient seen for complaints of Non-Healing Wound. Electronic Signature(s) Signed: 05/06/2022 1:36:50 PM By: [name] MD FACS Entered By: [name] on 05/06/2022 13:36:50 -------------------------------------------------------------------------------- HPI Details Patient Name: Date of Service: Evelyn Campbell [facility] 05/06/2022 12:30 PM Medical Record Number: [id number] Patient Account Number: [id number] Date of Birth/Sex: Treating RN: 09-16-1956 (66 y.o. Evelyn Campbell Primary Care Provider: [name] Other Clinician: Referring Provider: Treating Provider/Extender: [name] [name]: 3 History of Present Illness HPI Description: ADMISSION 01/21/2022 This is a 66 year old woman presenting with an ulcer on the right fifth metatarsal head. She says that it has been present for about 6 weeks. She is not certain how it developed, but she does have peripheral neuropathy. She also has lymphedema, hypertension, metabolic syndrome/prediabetes, but does not carry an actual diagnosis of diabetes nor is she on any medications for this. 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 ABI in clinic today was 0.8. She has been treated in the past for lymphedema, but has been unable to tolerate compression. She does have lymphedema pumps but does not use these regularly. She had been applying Neosporin and new skin  to her wound, but it did not really change much. She was referred here by her primary care provider for further evaluation and management. 01/28/2022: 1 week follow-up. Apparently there was some miscommunication with home health and they did not come out to help her with her dressing changes. The patient and her husband have been doing them themselves. The wound actually looks quite good, with decreased dimensions and a very nice healthy surface. 02/05/2022: The home health nurse came out during the week and the patient was happy with the care. The wound is smaller today with a clean base. 02/12/2022: The wound continues to contract and is quite small today. There is a bit of callus buildup, but the wound surface itself is very clean. 02/19/2022: The wound is nearly closed. There is a small amount of callus buildup. No slough or drainage. 02/25/2022: The wound is down to just a sliver. She continues to build up callus. No concern for infection. 03/04/2022: The wound is closed. READMISSION 04/12/2022 Last week, the patient noticed a spot of blood on her stocking where her wound had been. She noted that the wound was open. She began applying the Prisma silver collagen that we had used during her last admission. She wrapped it up and on Friday when she changed the dressing, she noticed that there was a new wound on the dorsal part of her right foot overlying the fifth metatarsal. Today it is quite red and tender. There is slough on the surface. The wound on the plantar surface has periwound callus accumulation but otherwise appears fairly clean. 04/19/2022: The culture that we took last week somehow has been lost and we have no results. The patient has been applying topical gentamicin and silver alginate to her wound, however and both of them looks smaller. The redness and tenderness has improved significantly. Her foot remains macerated in appearance, however. She had some bleeding from  the plantar wound. On  inspection, it looks like secondary to moisture, the surrounding skin and callus have lifted. When I probed with a thin cotton applicator, this created some minor bleeding and I think this is likely what happened. The tissue is just quite friable. 04/22/2022: The culture data were finally found and she did grow Proteus mirabilis from her wound. I prescribed Bactrim for her. She is here today for her first cast change. Both wound sites look cleaner and smaller today even after just a few days of casting. 04/26/2022: Both wounds are smaller and very clean. She is completing a course of Bactrim for Proteus mirabilis grown from her wound. 04/29/2022: The patient contacted our office yesterday concerned that the cast was pushing too hard against her great toe. She is here today to have the cast removed and a new one reapplied. The wound on the dorsal lateral part of her foot is closed. The wound on the plantar surface of the fifth metatarsal head is smaller and epithelializing. 05/06/2022: Her wounds are closed. Electronic Signature(s) Signed: 05/06/2022 1:37:09 PM By: [name] MD FACS Entered By: [name] on 05/06/2022 13:37:09 -------------------------------------------------------------------------------- Physical Exam Details Patient Name: Date of Service: Evelyn Campbell [facility] 05/06/2022 12:30 PM Medical Record Number: [id number] Patient Account Number: [id number] Date of Birth/Sex: Treating RN: Aug 29, 1956 (66 y.o. Evelyn Campbell Primary Care Provider: [name] Other Clinician: Referring Provider: Treating Provider/Extender: [name] [name]: 3 Constitutional . . . . No acute distress.Marland Kitchen Respiratory Normal work of breathing on room air.. Notes 05/06/2022: Her wounds are healed. Electronic Signature(s) Signed: 05/06/2022 1:37:57 PM By: [name] MD FACS Entered By: [name] on 05/06/2022  13:37:57 -------------------------------------------------------------------------------- Physician Orders Details Patient Name: Date of Service: Evelyn Campbell [facility] 05/06/2022 12:30 PM Medical Record Number: [id number] Patient Account Number: [id number] Date of Birth/Sex: Treating RN: 12/23/1955 (66 y.o. Evelyn Campbell Primary Care Provider: [name] Other Clinician: Referring Provider: Treating Provider/Extender: [name] [name]: 3 Verbal / Phone Orders: No Diagnosis Coding ICD-10 Coding Code Description [phone] Non-pressure chronic ulcer of other part of right foot with fat layer exposed I87.2 Venous insufficiency (chronic) (peripheral) G60.9 Hereditary and idiopathic neuropathy, unspecified R73.03 Prediabetes Discharge From [facility] Discharge from [place] your wound is healed! Consults nkle - inserts for Orthotic Shoes ICD10 Code: D66.440 Podiatry, [facility] Electronic Signature(s) Signed: 05/06/2022 4:32:49 PM By: Dellie Catholic RN Signed: 05/07/2022 7:32:29 AM By: [name] MD FACS Previous Signature: 05/06/2022 1:39:25 PM Version By: [name] MD FACS Entered By: Dellie Catholic on 05/06/2022 16:21:51 Prescription 05/06/2022 -------------------------------------------------------------------------------- Lamar Blinks MD Patient Name: Provider: [birth year] [id number] Date of Birth: NPI#Wanda Plump [id number] Sex: DEA #: [phone] Phone #: License #: [place] Patient Address: [place], [place] [address] [place], [place] [address] [phone] Allergies celecoxib; ibuprofen; meloxicam; Lipitor; amoxicillin; penicillin; Statins-HMG-CoA Reductase Inhibitors Provider's Orders nkle - inserts for Orthotic Shoes ICD10 Code: E83.151 Podiatry, [facility] Hand  Signature: Date(s): Electronic Signature(s) Signed: 05/06/2022 4:32:49 PM By: Dellie Catholic RN Signed: 05/07/2022 7:32:29 AM By: [name] MD FACS Previous Signature: 05/06/2022 1:40:18 PM Version By: [name] MD FACS Entered By: Dellie Catholic on 05/06/2022 16:21:51 -------------------------------------------------------------------------------- Problem List Details Patient Name: Date of Service: Evelyn Campbell [facility] 05/06/2022 12:30 PM Medical Record Number: [id number] Patient Account  Number: [id number] Date of Birth/Sex: Treating RN: 12-06-1955 (65 y.o. Evelyn Campbell Primary Care Provider: [name] Other Clinician: Referring Provider: Treating Provider/Extender: [name] [name]: 3 Active Problems ICD-10 Encounter Code Description Active Date MDM Diagnosis [phone] Non-pressure chronic ulcer of other part of right foot with fat layer exposed 04/12/2022 No Yes I87.2 Venous insufficiency (chronic) (peripheral) 04/12/2022 No Yes G60.9 Hereditary and idiopathic neuropathy, unspecified 04/12/2022 No Yes R73.03 Prediabetes 04/12/2022 No Yes Inactive Problems Resolved Problems Electronic Signature(s) Signed: 05/06/2022 1:36:39 PM By: [name] MD FACS Entered By: [name] on 05/06/2022 13:36:39 -------------------------------------------------------------------------------- Progress Note Details Patient Name: Date of Service: Evelyn Campbell [facility] 05/06/2022 12:30 PM Medical Record Number: [id number] Patient Account Number: [id number] Date of Birth/Sex: Treating RN: 25-Oct-1956 (66 y.o. Evelyn Campbell Primary Care Provider: [name] Other Clinician: Referring Provider: Treating Provider/Extender: [name] [name]: 3 Subjective Chief Complaint Information obtained from Patient Patient seen for complaints of Non-Healing Wound. History of Present Illness  (HPI) ADMISSION 01/21/2022 This is a 66 year old woman presenting with an ulcer on the right fifth metatarsal head. She says that it has been present for about 6 weeks. She is not certain how it developed, but she does have peripheral neuropathy. She also has lymphedema, hypertension, metabolic syndrome/prediabetes, but does not carry an actual diagnosis of diabetes nor is she on any medications for this. 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 ABI in clinic today was 0.8. She has been treated in the past for lymphedema, but has been unable to tolerate compression. She does have lymphedema pumps but does not use these regularly. She had been applying Neosporin and new skin to her wound, but it did not really change much. She was referred here by her primary care provider for further evaluation and management. 01/28/2022: 1 week follow-up. Apparently there was some miscommunication with home health and they did not come out to help her with her dressing changes. The patient and her husband have been doing them themselves. The wound actually looks quite good, with decreased dimensions and a very nice healthy surface. 02/05/2022: The home health nurse came out during the week and the patient was happy with the care. The wound is smaller today with a clean base. 02/12/2022: The wound continues to contract and is quite small today. There is a bit of callus buildup, but the wound surface itself is very clean. 02/19/2022: The wound is nearly closed. There is a small amount of callus buildup. No slough or drainage. 02/25/2022: The wound is down to just a sliver. She continues to build up callus. No concern for infection. 03/04/2022: The wound is closed. READMISSION 04/12/2022 Last week, the patient noticed a spot of blood on her stocking where her wound had been. She noted that the wound was open. She began applying the Prisma silver collagen that we had used during her  last admission. She wrapped it up and on Friday when she changed the dressing, she noticed that there was a new wound on the dorsal part of her right foot overlying the fifth metatarsal. Today it is quite red and tender. There is slough on the surface. The wound on the plantar surface has periwound callus accumulation but otherwise appears fairly clean. 04/19/2022: The culture that we took last week somehow has been lost and we have no results. The patient has been applying topical gentamicin and silver alginate to her wound,  however and both of them looks smaller. The redness and tenderness has improved significantly. Her foot remains macerated in appearance, however. She had some bleeding from the plantar wound. On inspection, it looks like secondary to moisture, the surrounding skin and callus have lifted. When I probed with a thin cotton applicator, this created some minor bleeding and I think this is likely what happened. The tissue is just quite friable. 04/22/2022: The culture data were finally found and she did grow Proteus mirabilis from her wound. I prescribed Bactrim for her. She is here today for her first cast change. Both wound sites look cleaner and smaller today even after just a few days of casting. 04/26/2022: Both wounds are smaller and very clean. She is completing a course of Bactrim for Proteus mirabilis grown from her wound. 04/29/2022: The patient contacted our office yesterday concerned that the cast was pushing too hard against her great toe. She is here today to have the cast removed and a new one reapplied. The wound on the dorsal lateral part of her foot is closed. The wound on the plantar surface of the fifth metatarsal head is smaller and epithelializing. 05/06/2022: Her wounds are closed. Patient History Information obtained from Patient. Family History Heart Disease - Father, No family history of Cancer, Diabetes, Hereditary Spherocytosis, Hypertension, Kidney Disease,  Lung Disease, Seizures, Stroke, Thyroid Problems, Tuberculosis. Social History Never smoker, Marital Status - Married, Alcohol Use - Never, Drug Use - No History, Caffeine Use - Never. Medical History Hematologic/Lymphatic Patient has history of Lymphedema Cardiovascular Patient has history of Hypertension, Peripheral Venous Disease Musculoskeletal Patient has history of Gout, Rheumatoid Arthritis - ankle Neurologic Patient has history of Neuropathy - Bilateral feet Hospitalization/Surgery History - Hernia repair (2016);Right ankle reconstruction. - insertion of mesh. - pancreatectomy. - toe surgery bilateral. - cholecystectomy. - abdominal hysterectomy. - tonsillectomy. - wrist surgery. Medical A Surgical History Notes nd Constitutional Symptoms (General Health) obesity Hematologic/Lymphatic Gout, vitamin D deficiency Cardiovascular PEriperal venous insufficiency Gastrointestinal Diverticular disease of colon: GERD, fatty liver Endocrine Prediabetes Neurologic Idiopathic peripheral neuropathy Objective Constitutional No acute distress.. Vitals Time Taken: 12:49 PM, Height: 65 in, Weight: 235 lbs, BMI: 39.1, Temperature: 98 F, Pulse: 63 bpm, Respiratory Rate: 18 breaths/min, Blood Pressure: 112/73 mmHg. Respiratory Normal work of breathing on room air.. General Notes: 05/06/2022: Her wounds are healed. Integumentary (Hair, Skin) Wound #1 status is Healed - Epithelialized. Original cause of wound was Gradually Appeared. The date acquired was: 12/26/2021. The wound has been in treatment 15 weeks. The wound is located on the Right Metatarsal head fifth. The wound measures 0cm length x 0cm width x 0cm depth; 0cm^2 area and 0cm^3 volume. There is no tunneling or undermining noted. There is a none present amount of drainage noted. The wound margin is distinct with the outline attached to the wound base. There is no granulation within the wound bed. There is no necrotic tissue within  the wound bed. Assessment Active Problems ICD-10 Non-pressure chronic ulcer of other part of right foot with fat layer exposed Venous insufficiency (chronic) (peripheral) Hereditary and idiopathic neuropathy, unspecified Prediabetes Plan Discharge From [facility] Services: Discharge from [place] your wound is healed! Consults ordered were: Podiatry, [facility] - For fitting of Orthotic Shoes ICD10 code: L97.512 05/06/2022: Her wounds are healed. I do think she would benefit from a custom orthotic insert and we will place a referral to podiatry to facilitate this. We will discharge her from the wound care center  and she may follow-up as needed. Electronic Signature(s) Signed: 05/06/2022 1:39:57 PM By: [name] MD FACS Entered By: [name] on 05/06/2022 13:39:57 -------------------------------------------------------------------------------- HxROS Details Patient Name: Date of Service: Evelyn Campbell [facility] 05/06/2022 12:30 PM Medical Record Number: [id number] Patient Account Number: [id number] Date of Birth/Sex: Treating RN: 1956/05/03 (66 y.o. Evelyn Campbell Primary Care Provider: [name] Other Clinician: Referring Provider: Treating Provider/Extender: [name] [name]: 3 Information Obtained From Patient Constitutional Symptoms (General Health) Medical History: Past Medical History Notes: obesity Hematologic/Lymphatic Medical History: Positive for: Lymphedema Past Medical History Notes: Gout, vitamin D deficiency Cardiovascular Medical History: Positive for: Hypertension; Peripheral Venous Disease Past Medical History Notes: PEriperal venous insufficiency Gastrointestinal Medical History: Past Medical History Notes: Diverticular disease of colon: GERD, fatty liver Endocrine Medical History: Past Medical History Notes: Prediabetes Musculoskeletal Medical History: Positive for: Gout;  Rheumatoid Arthritis - ankle Neurologic Medical History: Positive for: Neuropathy - Bilateral feet Past Medical History Notes: Idiopathic peripheral neuropathy Immunizations Pneumococcal Vaccine: Received Pneumococcal Vaccination: Yes Received Pneumococcal Vaccination On or After 60th Birthday: Yes Implantable Devices None Hospitalization / Surgery History Type of Hospitalization/Surgery Hernia repair (2016);Right ankle reconstruction insertion of mesh pancreatectomy toe surgery bilateral cholecystectomy abdominal hysterectomy tonsillectomy wrist surgery Family and Social History Cancer: No; Diabetes: No; Heart Disease: Yes - Father; Hereditary Spherocytosis: No; Hypertension: No; Kidney Disease: No; Lung Disease: No; Seizures: No; Stroke: No; Thyroid Problems: No; Tuberculosis: No; Never smoker; Marital Status - Married; Alcohol Use: Never; Drug Use: No History; Caffeine Use: Never; Financial Concerns: No; Food, Clothing or Shelter Needs: No; Support System Lacking: No; Transportation Concerns: No Electronic Signature(s) Signed: 05/06/2022 2:32:39 PM By: [name] MD FACS Signed: 05/06/2022 4:32:49 PM By: Dellie Catholic RN Entered By: [name] on 05/06/2022 13:37:16 -------------------------------------------------------------------------------- SuperBill Details Patient Name: Date of Service: Evelyn [place], [place] 05/06/2022 Medical Record Number: [id number] Patient Account Number: [id number] Date of Birth/Sex: Treating RN: 08-13-1956 (66 y.o. Evelyn Campbell Primary Care Provider: [name] Other Clinician: Referring Provider: Treating Provider/Extender: [name] [name]: 3 Diagnosis Coding ICD-10 Codes Code Description [phone] Non-pressure chronic ulcer of other part of right foot with fat layer exposed I87.2 Venous insufficiency (chronic) (peripheral) G60.9 Hereditary and idiopathic neuropathy,  unspecified R73.03 Prediabetes Facility Procedures CPT4 Code: [id number] Description: 99213 - WOUND CARE VISIT-LEV 3 EST PT Modifier: Quantity: 1 Physician Procedures : CPT4 Code Description Modifier [id number] - WC PHYS LEVEL 3 - EST PT ICD-10 Diagnosis Description L97.512 Non-pressure chronic ulcer of other part of right foot with fat layer exposed G60.9 Hereditary and idiopathic neuropathy, unspecified I87.2  Venous insufficiency (chronic) (peripheral) R73.03 Prediabetes Quantity: 1 Electronic Signature(s) Signed: 05/06/2022 4:32:49 PM By: Dellie Catholic RN Signed: 05/07/2022 7:32:29 AM By: [name] MD FACS Previous Signature: 05/06/2022 1:40:12 PM Version By: [name] MD FACS Entered By: Dellie Catholic on 05/06/2022 16:20:04

## 2022-05-25 ENCOUNTER — Encounter: Payer: Self-pay | Admitting: Podiatry

## 2022-05-25 ENCOUNTER — Ambulatory Visit (INDEPENDENT_AMBULATORY_CARE_PROVIDER_SITE_OTHER): Payer: Medicare Other | Admitting: Podiatry

## 2022-05-25 DIAGNOSIS — R262 Difficulty in walking, not elsewhere classified: Secondary | ICD-10-CM | POA: Insufficient documentation

## 2022-05-25 DIAGNOSIS — R141 Gas pain: Secondary | ICD-10-CM | POA: Insufficient documentation

## 2022-05-25 DIAGNOSIS — K625 Hemorrhage of anus and rectum: Secondary | ICD-10-CM | POA: Insufficient documentation

## 2022-05-25 DIAGNOSIS — I700 Atherosclerosis of aorta: Secondary | ICD-10-CM | POA: Insufficient documentation

## 2022-05-25 DIAGNOSIS — K760 Fatty (change of) liver, not elsewhere classified: Secondary | ICD-10-CM | POA: Insufficient documentation

## 2022-05-25 DIAGNOSIS — R194 Change in bowel habit: Secondary | ICD-10-CM | POA: Insufficient documentation

## 2022-05-25 DIAGNOSIS — R1033 Periumbilical pain: Secondary | ICD-10-CM | POA: Insufficient documentation

## 2022-05-25 DIAGNOSIS — Z6838 Body mass index (BMI) 38.0-38.9, adult: Secondary | ICD-10-CM | POA: Insufficient documentation

## 2022-05-25 DIAGNOSIS — K219 Gastro-esophageal reflux disease without esophagitis: Secondary | ICD-10-CM | POA: Insufficient documentation

## 2022-05-25 DIAGNOSIS — R635 Abnormal weight gain: Secondary | ICD-10-CM | POA: Insufficient documentation

## 2022-05-25 DIAGNOSIS — L97512 Non-pressure chronic ulcer of other part of right foot with fat layer exposed: Secondary | ICD-10-CM | POA: Insufficient documentation

## 2022-05-25 DIAGNOSIS — M1990 Unspecified osteoarthritis, unspecified site: Secondary | ICD-10-CM | POA: Insufficient documentation

## 2022-05-25 DIAGNOSIS — Z79899 Other long term (current) drug therapy: Secondary | ICD-10-CM | POA: Insufficient documentation

## 2022-05-25 DIAGNOSIS — D2371 Other benign neoplasm of skin of right lower limb, including hip: Secondary | ICD-10-CM

## 2022-05-25 DIAGNOSIS — N2889 Other specified disorders of kidney and ureter: Secondary | ICD-10-CM | POA: Insufficient documentation

## 2022-05-25 DIAGNOSIS — G720 Drug-induced myopathy: Secondary | ICD-10-CM | POA: Insufficient documentation

## 2022-05-25 DIAGNOSIS — M109 Gout, unspecified: Secondary | ICD-10-CM | POA: Insufficient documentation

## 2022-05-25 DIAGNOSIS — G629 Polyneuropathy, unspecified: Secondary | ICD-10-CM | POA: Insufficient documentation

## 2022-05-25 DIAGNOSIS — Z1211 Encounter for screening for malignant neoplasm of colon: Secondary | ICD-10-CM | POA: Insufficient documentation

## 2022-05-25 DIAGNOSIS — I890 Lymphedema, not elsewhere classified: Secondary | ICD-10-CM | POA: Insufficient documentation

## 2022-05-25 DIAGNOSIS — R7989 Other specified abnormal findings of blood chemistry: Secondary | ICD-10-CM | POA: Insufficient documentation

## 2022-05-26 NOTE — Progress Notes (Signed)
Subjective:  Patient ID: Evelyn Campbell, female    DOB: [birth year],  MRN: [id number] HPI Chief Complaint  Patient presents with   Foot Injury    Patient interested in orthotics - wound healed sub 5th and wound care ctr feels she needs some accommodative orthotics to keep pressure off, concerned about peeling skin around healing wound   New Patient (Initial Visit)    66 y.o. female presents with the above complaint.   ROS: Denies fever chills nausea vomiting muscle aches pains calf pain back pain chest pain shortness of breath.  Past Medical History:  Diagnosis Date   Arthritis    R ankle    Back pain    Chronic kidney disease    seen by Nephrology at [facility], due to all her numbers were"off", she  remarks that she was sent to superhydrate & then her labs values corrected.  Pt. will f/u /w nephrology   Constipation    Fatty liver    Gallbladder problem    GERD (gastroesophageal reflux disease)    occ   Gout    Hyperlipidemia    Hypertension    Joint pain    Lower extremity edema    Obesity    Peripheral neuropathy    Pneumonia 1980's   hx- not needed hospitalization    Vitamin D deficiency    Past Surgical History:  Procedure Laterality Date   ABDOMINAL HYSTERECTOMY  99   ANKLE FUSION Right 6/15   CHOLECYSTECTOMY  10   DENTAL SURGERY     implant- upper R    HERNIA REPAIR     INCISIONAL HERNIA REPAIR N/A 07/02/2015   Procedure: LAPAROSCOPIC INCISIONAL HERNIA;  Surgeon: Coralie Keens, MD;  Location: [facility];  Service: General;  Laterality: N/A;   INGUINAL HERNIA REPAIR Bilateral 08/20/2014   Procedure: LAPAROSCOPIC BILATERAL INGUINAL HERNIA REPAIR WITH MESH;  Surgeon: Coralie Keens, MD;  Location: [facility];  Service: General;  Laterality: Bilateral;   INSERTION OF MESH N/A 08/20/2014   Procedure: INSERTION OF MESH;  Surgeon: Coralie Keens, MD;  Location: [facility];  Service: General;  Laterality: N/A;   INSERTION OF MESH N/A 07/02/2015   Procedure: INSERTION OF  MESH;  Surgeon: Coralie Keens, MD;  Location: [facility];  Service: General;  Laterality: N/A;   PANCREATECTOMY N/A 08/20/2014   Procedure: LAPAROSCOPIC ADRENALECTOMY;  Surgeon: Stark Klein, MD;  Location: [facility];  Service: General;  Laterality: N/A;   TOE SURGERY Bilateral 2012   hammer toe   TONSILLECTOMY     UMBILICAL HERNIA REPAIR N/A 08/20/2014   Procedure: HERNIA REPAIR UMBILICAL ADULT;  Surgeon: Coralie Keens, MD;  Location: [facility];  Service: General;  Laterality: N/A;   VAGINAL DELIVERY  1994   WRIST SURGERY      Current Outpatient Medications:    allopurinol (ZYLOPRIM) 300 MG tablet, Take 300 mg by mouth daily., Disp: , Rfl:    amLODipine (NORVASC) 5 MG tablet, Take 1 tablet (5 mg total) by mouth daily., Disp: 90 tablet, Rfl: 0   aspirin EC 81 MG tablet, Take 81 mg by mouth at bedtime. , Disp: , Rfl:    atenolol (TENORMIN) 100 MG tablet, Take 100 mg by mouth at bedtime., Disp: , Rfl: 4   Cholecalciferol (VITAMIN D) 2000 UNITS tablet, Take 2,000 Units by mouth daily., Disp: , Rfl:    colchicine 0.6 MG tablet, 0.12 mg as needed., Disp: , Rfl:    docusate sodium (COLACE) 100 MG capsule, Take 200 mg by mouth daily. ,  Disp: , Rfl:    gabapentin (NEURONTIN) 300 MG capsule, Take 1,200 mg by mouth at bedtime. , Disp: , Rfl:    Multiple Vitamin (MULTIVITAMIN WITH MINERALS) TABS tablet, Take 1 tablet by mouth 2 (two) times daily. [facility], Disp: , Rfl:    niacin (NIASPAN) 1000 MG CR tablet, Take 2 tablets (2,000 mg total) by mouth at bedtime., Disp: 30 tablet, Rfl: 0   omega-3 acid ethyl esters (LOVAZA) 1 G capsule, Take 2 g by mouth 2 (two) times daily. , Disp: , Rfl:    omeprazole (PRILOSEC) 20 MG capsule, Take 20 mg by mouth daily., Disp: , Rfl:    OZEMPIC, 0.25 OR 0.5 MG/DOSE, 2 MG/3ML SOPN, SMARTSIG:0.25 Milligram(s) SUB-Q Every 4 Weeks, Disp: , Rfl:   Allergies  Allergen Reactions   Celebrex [Celecoxib] Diarrhea and Other (See Comments)    Cause bloody stools   Ibuprofen  Diarrhea and Other (See Comments)    Caused bloody stools   Meloxicam Diarrhea and Other (See Comments)    Caused bloody stools   Amoxicillin-Pot Clavulanate    Lipitor [Atorvastatin] Other (See Comments)    myalgia   Nsaids     Other reaction(s): Unknown   Statins Other (See Comments)    myalgia   Amoxicillin Itching and Rash   Penicillins Rash   Review of Systems Objective:  There were no vitals filed for this visit.  General: Well developed, nourished, in no acute distress, alert and oriented x3   Dermatological: Skin is warm, dry and supple bilateral. Nails x 10 are well maintained; remaining integument appears unremarkable at this time. There are no open sores, no preulcerative lesions, no rash or signs of infection present.  Currently she has some dry skin over the areas that were ulcerated that have gone on to heal uneventfully.  It appears that these were areas of pressure Sub fifth metatarsal head right foot and dorsal lateral fifth metatarsal head right foot.  Vascular: Dorsalis Pedis artery and Posterior Tibial artery pedal pulses are 2/4 bilateral with immedate capillary fill time. Pedal hair growth present. No varicosities and no lower extremity edema present bilateral.   Neruologic: Grossly intact via light touch bilateral. Vibratory intact via tuning fork bilateral. Protective threshold with Semmes Wienstein monofilament intact to all pedal sites bilateral. Patellar and Achilles deep tendon reflexes 2+ bilateral. No Babinski or clonus noted bilateral.   Musculoskeletal: No gross boney pedal deformities bilateral. No pain, crepitus, or limitation noted with foot and ankle range of motion bilateral. Muscular strength 5/5 in all groups tested bilateral.  Skin breakdown was most likely due to mild tailor's bunion deformity.  Gait: Unassisted, Nonantalgic.    Radiographs:  None taken  Assessment & Plan:   Assessment: Prominent fifth metatarsal head right foot mild  calluses Sub fifth right appears to be healing skin.  Plan: Discussed etiology pathology conservative surgical therapies currently she is already wearing accommodative orthotics which appear to be in good shape.  I recommended that she continue to wear these on a regular basis.  Though the shoes that she shows me does demonstrate that the shoes appear to be a bit narrow I explained to her that this very well could be her possible etiology of her skin breakdown in the first place she understands and is amenable to it.  She is going to watch the wear of the shoe and make sure that this does not recur.  She also goes on to state that she is using a diabetic  formula type cream for her foot which I explained to her that would be just fine should not cause any issues and would help keep the area moisturized to help prevent the skin irritation and breakdown.  I explained to her that she started to notice redness or swelling or any skin breakdown she is to notify us immediately or her wound care center.     Katherine Syme T. [place], [place]

## 2022-06-09 ENCOUNTER — Encounter (INDEPENDENT_AMBULATORY_CARE_PROVIDER_SITE_OTHER): Payer: Self-pay

## 2022-09-23 IMAGING — CT CT ABD-PELV W/ CM
1 of 3 series · 13 of 32 positions shown, 18 images · IV contrast (APPLIED)
Comparison: 06/26/2014

CLINICAL DATA: Inguinal adenopathy, and edema of LEFT lower
extremity, venous flow obstruction, history umbilical hernia repair,
cholecystectomy, hysterectomy, LEFT adrenalectomy, hypertension

EXAM:
CT ABDOMEN AND PELVIS WITH CONTRAST
TECHNIQUE: Multidetector CT imaging of the abdomen and pelvis was performed
using the standard protocol following bolus administration of
intravenous contrast. Sagittal and coronal MPR images reconstructed
from axial data set.
CONTRAST:  100mL BTCEB4-2NN IOPAMIDOL (BTCEB4-2NN) INJECTION 61% IV.
Dilute oral contrast.

[Series 2: abd/pelvis w/cm · axial · 0.90mm/px · z∈[-441,-51]mm · 13 of 91 slices shown, 18 images]
[im 7/91  soft-tissue]
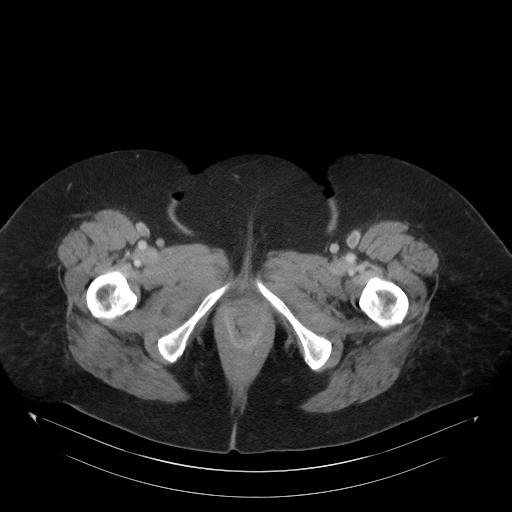
[im 7/91  bone]
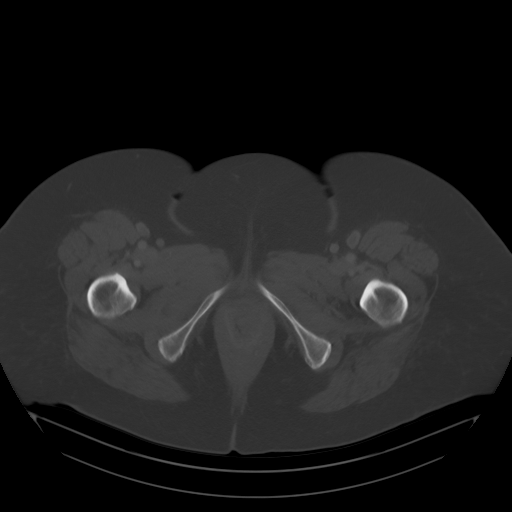
[im 13/91  soft-tissue]
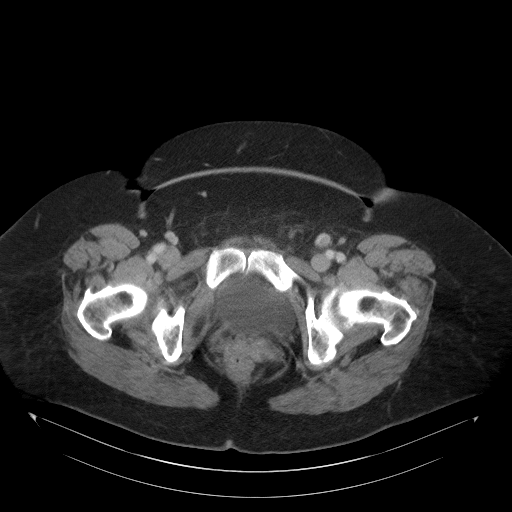
[im 19/91  soft-tissue]
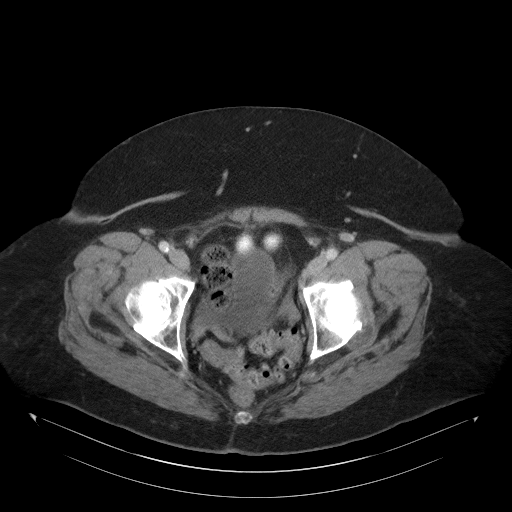
[im 31/91  soft-tissue]
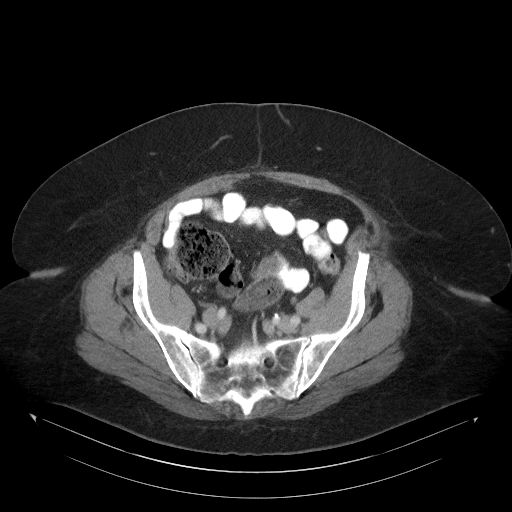
[im 37/91  soft-tissue]
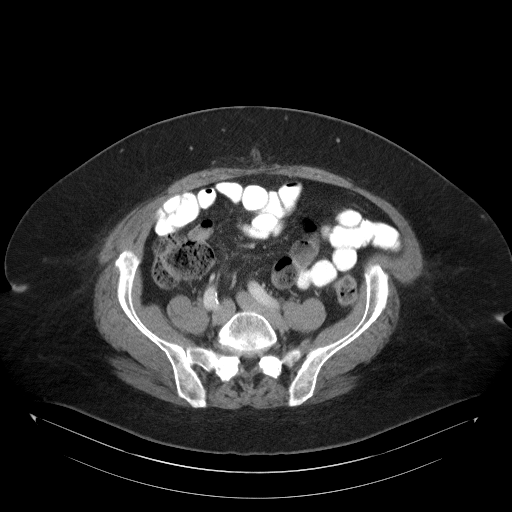
[im 43/91  soft-tissue]
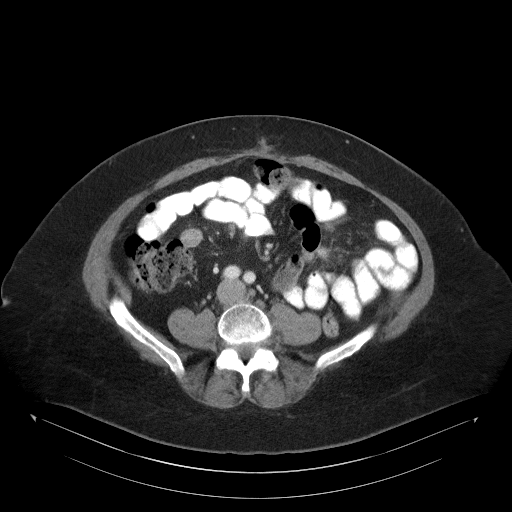
[im 49/91  soft-tissue]
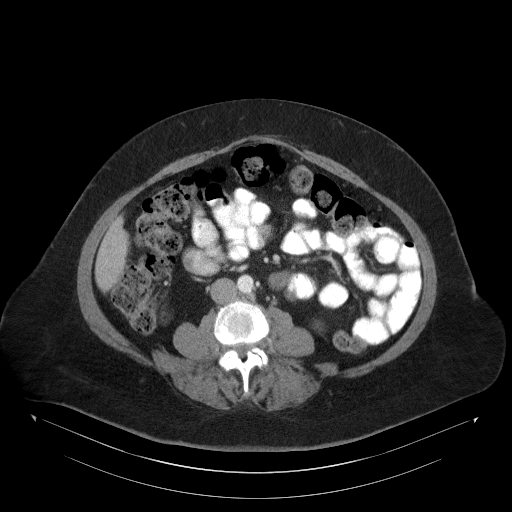
[im 55/91  soft-tissue]
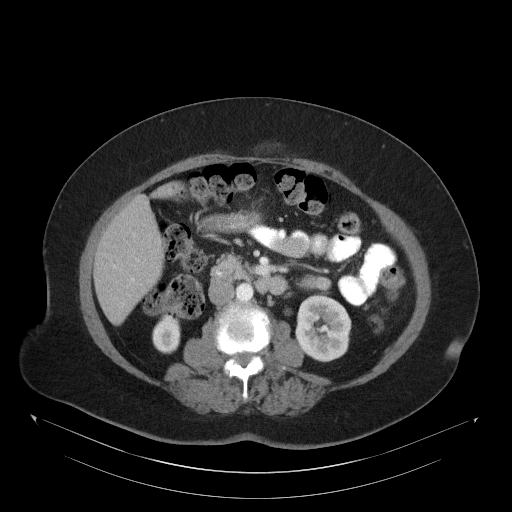
[im 61/91  soft-tissue]
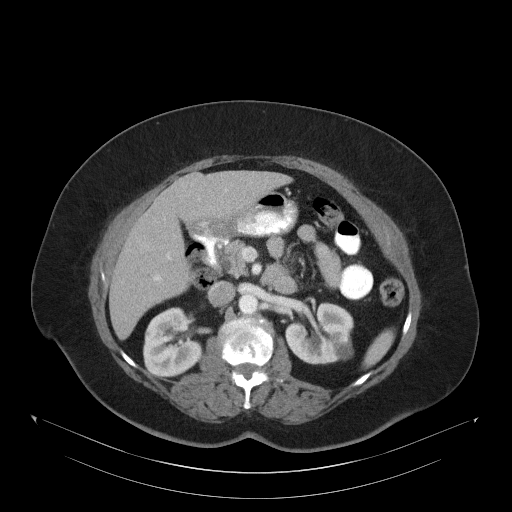
[im 61/91  bone]
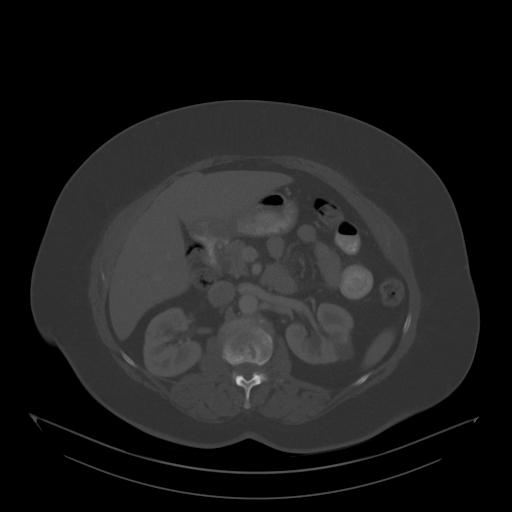
[im 67/91  lung]
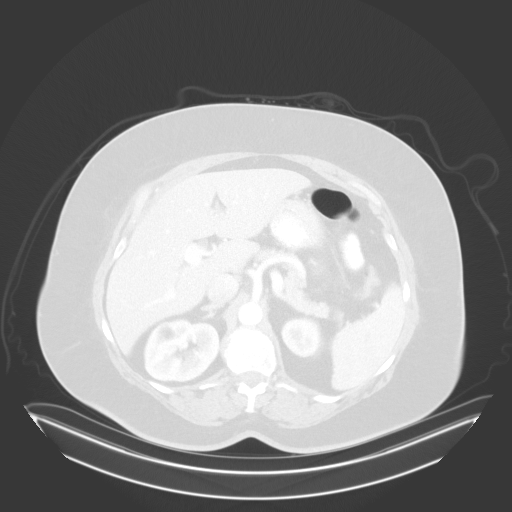
[im 73/91  soft-tissue]
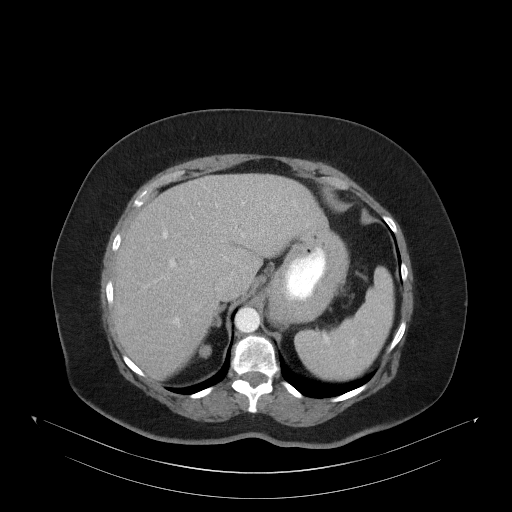
[im 73/91  lung]
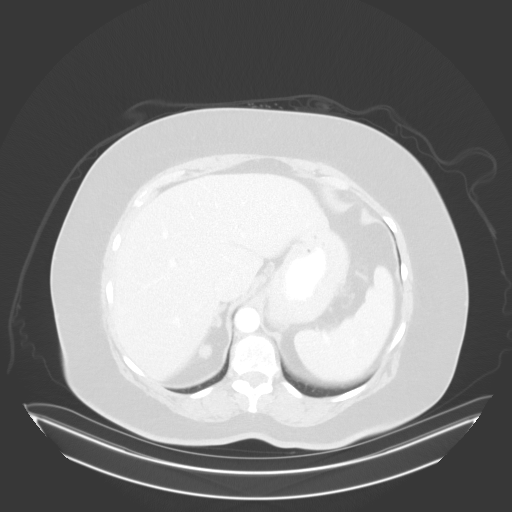
[im 79/91  soft-tissue]
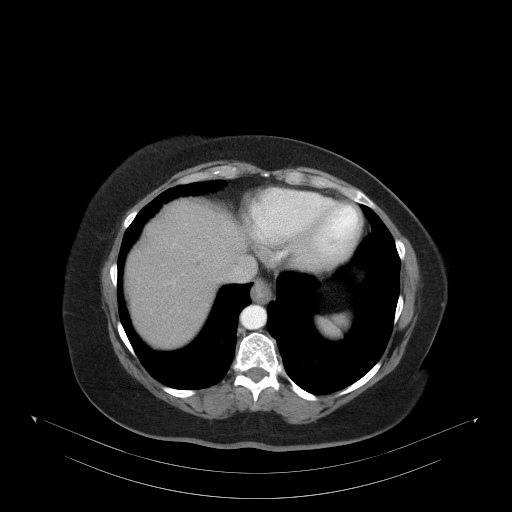
[im 79/91  lung]
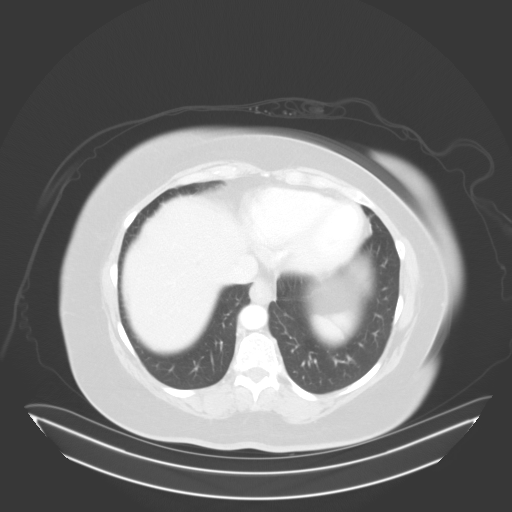
[im 85/91  soft-tissue]
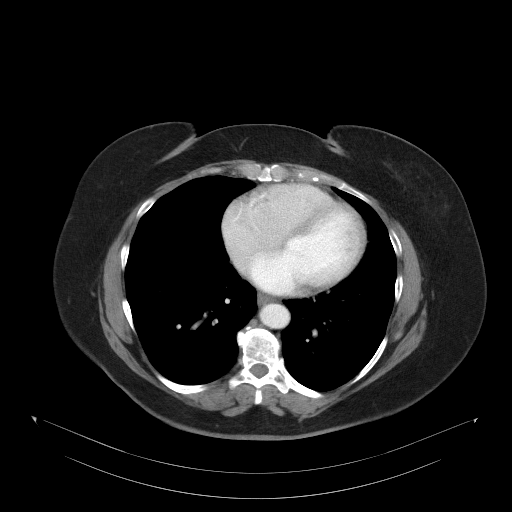
[im 85/91  lung]
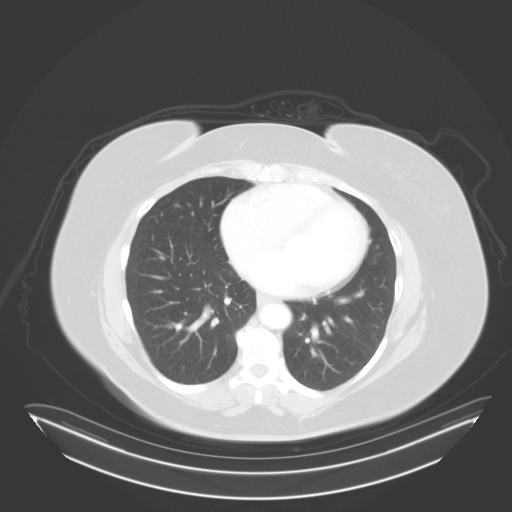

[13 of 32 positions shown; findings below may reference images not displayed]

FINDINGS: Lower chest: Lung bases clear

Hepatobiliary: Gallbladder surgically absent. Liver normal
appearance.

Pancreas: Normal appearance

Spleen: Nonspecific low-attenuation focus within spleen 5 mm
diameter, unchanged. Otherwise unremarkable.

Adrenals/Urinary Tract: LEFT adrenal gland surgically absent. RIGHT
adrenal gland unremarkable. Small angiomyolipoma upper pole RIGHT
kidney 19 x 17 mm. Tiny intermediate attenuation nodule at superior
pole RIGHT kidney 12 x 10 mm, showing washout of contrast on delayed
image, question tiny solid tumor. Small LEFT renal cyst 21 x 20 mm.
Kidneys, ureters, and bladder otherwise normal appearance.

Stomach/Bowel: Normal appendix. Scattered stool in colon. Minimal
sigmoid diverticulosis without evidence of diverticulitis. Stomach
incompletely distended. Bowel loops otherwise unremarkable.

Vascular/Lymphatic: Vascular structures patent. Minimal
atherosclerotic calcification aorta and iliac arteries. Normal sized
inguinal lymph nodes bilaterally. No abdominal or pelvic adenopathy
seen.

Reproductive: Uterus surgically absent.  Unremarkable ovaries.

Other: No free air or free fluid. Prior supraumbilical ventral
hernia repair with mild laxity at the superior margin.

Musculoskeletal: No acute osseous findings.
IMPRESSION: Prior supraumbilical ventral hernia repair with mild laxity at the
superior margin.

Small angiomyolipoma RIGHT kidney 19 x 17 mm and LEFT renal cyst 21
x 20 mm.

Tiny intermediate attenuation nodule at superior pole RIGHT kidney
12 x 10 mm, showing washout of contrast on delayed images, question
tiny solid tumor; renal neoplasm not excluded and further assessment
by MR imaging with and without contrast recommended.

Minimal distal colonic diverticulosis without evidence of
diverticulitis.

Aortic Atherosclerosis (6LQ5X-3X2.2).

These results will be called to the ordering clinician or
representative by the Radiologist Assistant, and communication
documented in the PACS or [REDACTED].

## 2022-09-27 ENCOUNTER — Encounter (HOSPITAL_BASED_OUTPATIENT_CLINIC_OR_DEPARTMENT_OTHER): Payer: Medicare Other | Attending: General Surgery | Admitting: General Surgery

## 2022-09-27 DIAGNOSIS — G629 Polyneuropathy, unspecified: Secondary | ICD-10-CM | POA: Diagnosis not present

## 2022-09-27 DIAGNOSIS — I872 Venous insufficiency (chronic) (peripheral): Secondary | ICD-10-CM | POA: Insufficient documentation

## 2022-09-27 DIAGNOSIS — L97512 Non-pressure chronic ulcer of other part of right foot with fat layer exposed: Secondary | ICD-10-CM | POA: Diagnosis present

## 2022-09-27 NOTE — Progress Notes (Signed)
DELISIA, MCQUISTON ([id number]) [id number] Page 1 of 9 Visit Report for 09/27/2022 Chief Complaint Document Details Patient Name: Date of Service: Evelyn Campbell [facility] 09/27/2022 1:30 PM Medical Record Number: [id number] Patient Account Number: [id number] Date of Birth/Sex: Treating RN: [birth year] (66 y.o. F) Primary Care Provider: [name] Other Clinician: Referring Provider: Treating Provider/Extender: [name] [name]: 0 Information Obtained from: Patient Chief Complaint Patient seen for complaints of Non-Healing Wound. 09/27/2022: re-opening of wound R foot Electronic Signature(s) Signed: 09/27/2022 3:31:06 PM By: [name] MD FACS Previous Signature: 09/27/2022 3:30:33 PM Version By: [name] MD FACS Entered By: [name] on 09/27/2022 15:31:06 -------------------------------------------------------------------------------- HPI Details Patient Name: Date of Service: Evelyn Campbell, Evelyn Campbell [facility] 09/27/2022 1:30 PM Medical Record Number: [id number] Patient Account Number: [id number] Date of Birth/Sex: Treating RN: [birth year] (66 y.o. F) Primary Care Provider: [name] Other Clinician: Referring Provider: Treating Provider/Extender: [name] [name]: 0 History of Present Illness HPI Description: ADMISSION 01/21/2022 This is a 66 year old woman presenting with an ulcer on the right fifth metatarsal head. She says that it has been present for about 6 weeks. She is not certain how it developed, but she does have peripheral neuropathy. She also has lymphedema, hypertension, metabolic syndrome/prediabetes, but does not carry an actual diagnosis of diabetes nor is she on any medications for this. 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 ABI in clinic today was 0.8. She has been treated in the past for lymphedema, but has  been unable to tolerate compression. She does have lymphedema pumps but does not use these regularly. She had been applying Neosporin and new skin to her wound, but it did not really change much. She was referred here by her primary care provider for further evaluation and management. 01/28/2022: 1 week follow-up. Apparently there was some miscommunication with home health and they did not come out to help her with her dressing changes. The patient and her husband have been doing them themselves. The wound actually looks quite good, with decreased dimensions and a very nice healthy surface. 02/05/2022: The home health nurse came out during the week and the patient was happy with the care. The wound is smaller today with a clean base. 02/12/2022: The wound continues to contract and is quite small today. There is a bit of callus buildup, but the wound surface itself is very clean. 02/19/2022: The wound is nearly closed. There is a small amount of callus buildup. No slough or drainage. 02/25/2022: The wound is down to just a sliver. She continues to build up callus. No concern for infection. 03/04/2022: The wound is closed. READMISSION 04/12/2022 Last week, the patient noticed a spot of blood on her stocking where her wound had been. She noted that the wound was open. She began applying the Prisma silver collagen that we had used during her last admission. She wrapped it up and on Friday when she changed the dressing, she noticed that there was a new wound on the dorsal part of her right foot overlying the fifth metatarsal. Today it is quite red and tender. There is slough on the surface. The wound on the Goodman, Drena ([id number]) [id number] Page 2 of 9 plantar surface has periwound callus accumulation but otherwise appears fairly clean. 04/19/2022: The culture that we took last week somehow has been lost and we have no results. The patient has been applying topical gentamicin  and silver alginate to her  wound, however and both of them looks smaller. The redness and tenderness has improved significantly. Her foot remains macerated in appearance, however. She had some bleeding from the plantar wound. On inspection, it looks like secondary to moisture, the surrounding skin and callus have lifted. When I probed with a thin cotton applicator, this created some minor bleeding and I think this is likely what happened. The tissue is just quite friable. 04/22/2022: The culture data were finally found and she did grow Proteus mirabilis from her wound. I prescribed Bactrim for her. She is here today for her first cast change. Both wound sites look cleaner and smaller today even after just a few days of casting. 04/26/2022: Both wounds are smaller and very clean. She is completing a course of Bactrim for Proteus mirabilis grown from her wound. 04/29/2022: The patient contacted our office yesterday concerned that the cast was pushing too hard against her great toe. She is here today to have the cast removed and a new one reapplied. The wound on the dorsal lateral part of her foot is closed. The wound on the plantar surface of the fifth metatarsal head is smaller and epithelializing. 05/06/2022: Her wounds are closed. READMISSION 09/27/2022 She returns with reopening of her wound on the right fifth metatarsal area after she healed in July, she was seen by Dr. Milinda Pointer at [facility]. She was apparently wearing accommodative orthotics that were in good condition and he recommended that she continue to wear those. He also identified that her shoes seemed narrow and that she should consider a wider shoe. We had recommended that she wear a callus pad or a foam donut of sorts to protect the area but she has not been doing this. She is wearing wide shoes but despite this, over the past week, she says that she opened up the wound and the exact same location as before. On inspection, it  appears that she probably had developed a large callus that ultimately broke down deep to the surface. The tissue is macerated and the fat layer is exposed. There is no purulent drainage or malodor. No visible signs of infection. Electronic Signature(s) Signed: 09/27/2022 3:36:25 PM By: [name] MD FACS Entered By: [name] on 09/27/2022 15:36:25 -------------------------------------------------------------------------------- Physical Exam Details Patient Name: Date of Service: Evelyn Campbell, [place] 09/27/2022 1:30 PM Medical Record Number: [id number] Patient Account Number: [id number] Date of Birth/Sex: Treating RN: 1956/09/08 (66 y.o. F) Primary Care Provider: [name] Other Clinician: Referring Provider: Treating Provider/Extender: [name] [name]: 0 Constitutional Mildly hypertensive. . . . No acute distress. Respiratory Normal work of breathing on room air.. Notes 09/27/2022: On inspection, it appears that she probably had developed a large callus that ultimately broke down deep to the surface. The tissue is macerated and the fat layer is exposed. There is no purulent drainage or malodor. No visible signs of infection. Electronic Signature(s) Signed: 09/27/2022 3:36:55 PM By: [name] MD FACS Entered By: [name] on 09/27/2022 15:36:55 -------------------------------------------------------------------------------- Physician Orders Details Patient Name: Date of Service: Evelyn Campbell, [place] 09/27/2022 1:30 PM Medical Record Number: [id number] Patient Account Number: [id number] Date of Birth/Sex: Treating RN: 11/25/1955 (66 y.o. [name] Primary Care Provider: [name] Other Clinician: Referring Provider: Treating Provider/Extender: [name] [place], [place] ([id number]) [phone].pdf Page 3 of 9 [name]: 0 Verbal / Phone  Orders: No Diagnosis Coding ICD-10 Coding Code Description L97.512 Non-pressure chronic ulcer of  other part of right foot with fat layer exposed I87.2 Venous insufficiency (chronic) (peripheral) G62.9 Polyneuropathy, unspecified E66.01 Morbid (severe) obesity due to excess calories Follow-up Appointments ppointment in 1 week. - Dr. Celine Ahr - room 2 Return A Anesthetic (In clinic) Topical Lidocaine 4% applied to wound bed Bathing/ Shower/ Hygiene May shower with protection but do not get wound dressing(s) wet. - Please use cast protector (Can purchase cast protector from local pharmacy, such as [facility] or [facility]) Edema Control - Lymphedema / SCD / Other Elevate legs to the level of the heart or above for 30 minutes daily and/or when sitting, a frequency of: Avoid standing for long periods of time. Patient to wear own compression stockings every day. - L left Exercise regularly Off-Loading Total Contact Cast to Right Lower Extremity Removable cast walker boot to: - right foot Wound Treatment Wound #3 - Foot Wound Laterality: Right Cleanser: Soap and Water 1 x Per Week/30 Days Discharge Instructions: May shower and wash wound with dial antibacterial soap and water prior to dressing change. Cleanser: Wound Cleanser 1 x Per Week/30 Days Discharge Instructions: Cleanse the wound with wound cleanser prior to applying a clean dressing using gauze sponges, not tissue or cotton balls. Peri-Wound Care: Zinc Oxide Ointment 30g tube 1 x Per Week/30 Days Discharge Instructions: Apply Zinc Oxide to periwound with each dressing change Prim Dressing: KerraCel Ag Gelling Fiber Dressing, 2x2 in (silver alginate) 1 x Per Week/30 Days ary Discharge Instructions: Apply silver alginate to wound bed as instructed Secondary Dressing: Woven Gauze Sponges 2x2 in 1 x Per Week/30 Days Discharge Instructions: Apply over primary dressing as directed. Secondary Dressing: Zetuvit Plus Silicone Border Dressing 4x4  (in/in) 1 x Per Week/30 Days Discharge Instructions: Apply silicone border over primary dressing as directed. Patient Medications llergies: celecoxib, ibuprofen, meloxicam, Lipitor, amoxicillin, penicillin, Statins-HMG-CoA Reductase Inhibitors A Notifications Medication Indication Start End 09/27/2022 lidocaine DOSE topical 4 % cream - cream topical Electronic Signature(s) Signed: 09/27/2022 4:56:20 PM By: [name] MD FACS Entered By: [name] on 09/27/2022 15:38:23 Evelyn Campbell ([id number]) [id number] Page 4 of 9 -------------------------------------------------------------------------------- Problem List Details Patient Name: Date of Service: Evelyn Campbell [facility] 09/27/2022 1:30 PM Medical Record Number: [id number] Patient Account Number: [id number] Date of Birth/Sex: Treating RN: [birth year] (66 y.o. F) Primary Care Provider: [name] Other Clinician: Referring Provider: Treating Provider/Extender: [name] [name]: 0 Active Problems ICD-10 Encounter Code Description Active Date MDM Diagnosis L97.512 Non-pressure chronic ulcer of other part of right foot with fat layer exposed 09/27/2022 No Yes I87.2 Venous insufficiency (chronic) (peripheral) 09/27/2022 No Yes G62.9 Polyneuropathy, unspecified 09/27/2022 No Yes E66.01 Morbid (severe) obesity due to excess calories 09/27/2022 No Yes Inactive Problems Resolved Problems Electronic Signature(s) Signed: 09/27/2022 3:29:55 PM By: [name] MD FACS Entered By: [name] on 09/27/2022 15:29:55 -------------------------------------------------------------------------------- Progress Note Details Patient Name: Date of Service: Evelyn Campbell, Evelyn Campbell [facility] 09/27/2022 1:30 PM Medical Record Number: [id number] Patient Account Number: [id number] Date of Birth/Sex: Treating RN: 30-Oct-1956 (66 y.o. F) Primary Care Provider: [name]  Other Clinician: Referring Provider: Treating Provider/Extender: [name] [name]: 0 Subjective Chief Complaint Information obtained from Patient Patient seen for complaints of Non-Healing Wound. 09/27/2022: re-opening of wound R foot History of Present Illness (HPI) ADMISSION 01/21/2022 This is a 66 year old woman presenting with an ulcer on the right fifth metatarsal head. She says that it has been present for about 6 weeks. She is not certain how it  developed, but she does have peripheral neuropathy. She also has lymphedema, hypertension, metabolic syndrome/prediabetes, but does not carry an actual diagnosis of diabetes nor is she on any medications for this. 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 ABI in clinic today was 0.8. She has been treated in the past for lymphedema, but has been unable to tolerate compression. She does have lymphedema pumps but does not use these regularly. She had been applying Neosporin and new skin to her wound, but it did not really change much. She was referred here by her primary care provider for further evaluation and management. 01/28/2022: 1 week follow-up. Apparently there was some miscommunication with home health and they did not come out to help her with her dressing changes. The patient and her husband have been doing them themselves. The wound actually looks quite good, with decreased dimensions and a very nice healthy surface. Evelyn Campbell, Evelyn Campbell ([id number]) [id number] Page 5 of 9 02/05/2022: The home health nurse came out during the week and the patient was happy with the care. The wound is smaller today with a clean base. 02/12/2022: The wound continues to contract and is quite small today. There is a bit of callus buildup, but the wound surface itself is very clean. 02/19/2022: The wound is nearly closed. There is a small amount of callus buildup.  No slough or drainage. 02/25/2022: The wound is down to just a sliver. She continues to build up callus. No concern for infection. 03/04/2022: The wound is closed. READMISSION 04/12/2022 Last week, the patient noticed a spot of blood on her stocking where her wound had been. She noted that the wound was open. She began applying the Prisma silver collagen that we had used during her last admission. She wrapped it up and on Friday when she changed the dressing, she noticed that there was a new wound on the dorsal part of her right foot overlying the fifth metatarsal. Today it is quite red and tender. There is slough on the surface. The wound on the plantar surface has periwound callus accumulation but otherwise appears fairly clean. 04/19/2022: The culture that we took last week somehow has been lost and we have no results. The patient has been applying topical gentamicin and silver alginate to her wound, however and both of them looks smaller. The redness and tenderness has improved significantly. Her foot remains macerated in appearance, however. She had some bleeding from the plantar wound. On inspection, it looks like secondary to moisture, the surrounding skin and callus have lifted. When I probed with a thin cotton applicator, this created some minor bleeding and I think this is likely what happened. The tissue is just quite friable. 04/22/2022: The culture data were finally found and she did grow Proteus mirabilis from her wound. I prescribed Bactrim for her. She is here today for her first cast change. Both wound sites look cleaner and smaller today even after just a few days of casting. 04/26/2022: Both wounds are smaller and very clean. She is completing a course of Bactrim for Proteus mirabilis grown from her wound. 04/29/2022: The patient contacted our office yesterday concerned that the cast was pushing too hard against her great toe. She is here today to have the cast removed and a new one  reapplied. The wound on the dorsal lateral part of her foot is closed. The wound on the plantar surface of the fifth metatarsal head is smaller and epithelializing. 05/06/2022: Her wounds are closed. READMISSION 09/27/2022  She returns with reopening of her wound on the right fifth metatarsal area after she healed in July, she was seen by Dr. Milinda Pointer at [facility]. She was apparently wearing accommodative orthotics that were in good condition and he recommended that she continue to wear those. He also identified that her shoes seemed narrow and that she should consider a wider shoe. We had recommended that she wear a callus pad or a foam donut of sorts to protect the area but she has not been doing this. She is wearing wide shoes but despite this, over the past week, she says that she opened up the wound and the exact same location as before. On inspection, it appears that she probably had developed a large callus that ultimately broke down deep to the surface. The tissue is macerated and the fat layer is exposed. There is no purulent drainage or malodor. No visible signs of infection. Patient History Information obtained from Patient. Allergies celecoxib, ibuprofen, meloxicam, Lipitor, amoxicillin, penicillin, Statins-HMG-CoA Reductase Inhibitors Family History Heart Disease - Father, No family history of Cancer, Diabetes, Hereditary Spherocytosis, Hypertension, Kidney Disease, Lung Disease, Seizures, Stroke, Thyroid Problems, Tuberculosis. Social History Never smoker, Marital Status - Married, Alcohol Use - Never, Drug Use - No History, Caffeine Use - Never. Medical History Hematologic/Lymphatic Patient has history of Lymphedema Cardiovascular Patient has history of Hypertension, Peripheral Venous Disease Musculoskeletal Patient has history of Gout, Rheumatoid Arthritis - ankle Neurologic Patient has history of Neuropathy - Bilateral feet Hospitalization/Surgery History - Hernia  repair (2016);Right ankle reconstruction. - insertion of mesh. - pancreatectomy. - toe surgery bilateral. - cholecystectomy. - abdominal hysterectomy. - tonsillectomy. - wrist surgery. Medical A Surgical History Notes Campbell Constitutional Symptoms (General Health) obesity Hematologic/Lymphatic Gout, vitamin D deficiency Cardiovascular PEriperal venous insufficiency Gastrointestinal Diverticular disease of colon: GERD, fatty liver Endocrine Prediabetes Neurologic Idiopathic peripheral neuropathy Evelyn Campbell, Evelyn Campbell ([id number]) [id number] Page 6 of 9 Objective Constitutional Mildly hypertensive. No acute distress. Vitals Time Taken: 1:53 PM, Temperature: 97.7 F, Pulse: 66 bpm, Respiratory Rate: 18 breaths/min, Blood Pressure: 147/90 mmHg. Respiratory Normal work of breathing on room air.. General Notes: 09/27/2022: On inspection, it appears that she probably had developed a large callus that ultimately broke down deep to the surface. The tissue is macerated and the fat layer is exposed. There is no purulent drainage or malodor. No visible signs of infection. Integumentary (Hair, Skin) Wound #3 status is Open. Original cause of wound was Gradually Appeared. The date acquired was: 09/21/2022. The wound is located on the Right Foot. The wound measures 3.3cm length x 1.5cm width x 0.1cm depth; 3.888cm^2 area and 0.389cm^3 volume. There is Fat Layer (Subcutaneous Tissue) exposed. There is no tunneling or undermining noted. There is a medium amount of serosanguineous drainage noted. The wound margin is distinct with the outline attached to the wound base. There is large (67-100%) red, pink granulation within the wound bed. There is a small (1-33%) amount of necrotic tissue within the wound bed including Adherent Slough. The periwound skin appearance had no abnormalities noted for color. The periwound skin appearance exhibited: Callus, Dry/Scaly. Periwound temperature  was noted as No Abnormality. Assessment Active Problems ICD-10 Non-pressure chronic ulcer of other part of right foot with fat layer exposed Venous insufficiency (chronic) (peripheral) Polyneuropathy, unspecified Morbid (severe) obesity due to excess calories Procedures Wound #3 Pre-procedure diagnosis of Wound #3 is a T be determined located on the Right Foot . There was a T Contact Cast Procedure by [name], MD. o  otal Post procedure Diagnosis Wound #3: Same as Pre-Procedure Notes: scribed for Dr. Celine Ahr by Adline Peals, RN. Plan Follow-up Appointments: Return Appointment in 1 week. - Dr. Celine Ahr - room 2 Anesthetic: (In clinic) Topical Lidocaine 4% applied to wound bed Bathing/ Shower/ Hygiene: May shower with protection but do not get wound dressing(s) wet. - Please use cast protector (Can purchase cast protector from local pharmacy, such as [facility] or [facility]) Edema Control - Lymphedema / SCD / Other: Elevate legs to the level of the heart or above for 30 minutes daily and/or when sitting, a frequency of: Avoid standing for long periods of time. Patient to wear own compression stockings every day. - L left Exercise regularly Off-Loading: T Contact Cast to Right Lower Extremity otal Removable cast walker boot to: - right foot The following medication(s) was prescribed: lidocaine topical 4 % cream cream topical was prescribed at facility WOUND #3: - Foot Wound Laterality: Right Cleanser: Soap and Water 1 x Per Week/30 Days Discharge Instructions: May shower and wash wound with dial antibacterial soap and water prior to dressing change. Cleanser: Wound Cleanser 1 x Per Week/30 Days Discharge Instructions: Cleanse the wound with wound cleanser prior to applying a clean dressing using gauze sponges, not tissue or cotton balls. Peri-Wound Care: Zinc Oxide Ointment 30g tube 1 x Per Week/30 Days Discharge Instructions: Apply Zinc Oxide to periwound with each dressing  change Prim Dressing: KerraCel Ag Gelling Fiber Dressing, 2x2 in (silver alginate) 1 x Per Week/30 Days ary Discharge Instructions: Apply silver alginate to wound bed as instructed Secondary Dressing: Woven Gauze Sponges 2x2 in 1 x Per Week/30 Days Evelyn Campbell, Evelyn Campbell ([id number]) [id number] Page 7 of 9 Discharge Instructions: Apply over primary dressing as directed. Secondary Dressing: Zetuvit Plus Silicone Border Dressing 4x4 (in/in) 1 x Per Week/30 Days Discharge Instructions: Apply silicone border over primary dressing as directed. 09/27/2022: This is a 66 year old woman who presents with reopening of her right foot ulcer. On inspection, it appears that she probably had developed a large callus that ultimately broke down deep to the surface. The tissue is macerated and the fat layer is exposed. There is no purulent drainage or malodor. No visible signs of infection. After cleaning up the wound area, we applied periwound zinc oxide and silver alginate. As she has done well with a total contact cast in the past, we elected to proceed with application today. This was applied in standard fashion without difficulty. She will follow-up in 1 week. Electronic Signature(s) Signed: 09/27/2022 3:39:38 PM By: [name] MD FACS Entered By: [name] on 09/27/2022 15:39:38 -------------------------------------------------------------------------------- HxROS Details Patient Name: Date of Service: Evelyn Campbell, [place] 09/27/2022 1:30 PM Medical Record Number: [id number] Patient Account Number: [id number] Date of Birth/Sex: Treating RN: 12/26/1955 (66 y.o. [name] Primary Care Provider: [name] Other Clinician: Referring Provider: Treating Provider/Extender: [name] [name]: 0 Information Obtained From Patient Constitutional Symptoms (General Health) Medical History: Past Medical History  Notes: obesity Hematologic/Lymphatic Medical History: Positive for: Lymphedema Past Medical History Notes: Gout, vitamin D deficiency Cardiovascular Medical History: Positive for: Hypertension; Peripheral Venous Disease Past Medical History Notes: PEriperal venous insufficiency Gastrointestinal Medical History: Past Medical History Notes: Diverticular disease of colon: GERD, fatty liver Endocrine Medical History: Past Medical History Notes: Prediabetes Musculoskeletal Medical History: Positive for: Gout; Rheumatoid Arthritis - ankle Neurologic Medical HistoryNABILA, Evelyn Campbell ([id number]) [id number] Page 8 of 9 Positive for: Neuropathy - Bilateral feet Past Medical History Notes: Idiopathic peripheral neuropathy  Immunizations Pneumococcal Vaccine: Received Pneumococcal Vaccination: Yes Received Pneumococcal Vaccination On or After 60th Birthday: Yes Implantable Devices None Hospitalization / Surgery History Type of Hospitalization/Surgery Hernia repair (2016);Right ankle reconstruction insertion of mesh pancreatectomy toe surgery bilateral cholecystectomy abdominal hysterectomy tonsillectomy wrist surgery Family and Social History Cancer: No; Diabetes: No; Heart Disease: Yes - Father; Hereditary Spherocytosis: No; Hypertension: No; Kidney Disease: No; Lung Disease: No; Seizures: No; Stroke: No; Thyroid Problems: No; Tuberculosis: No; Never smoker; Marital Status - Married; Alcohol Use: Never; Drug Use: No History; Caffeine Use: Never; Financial Concerns: No; Food, Clothing or Shelter Needs: No; Support System Lacking: No; Transportation Concerns: No Electronic Signature(s) Signed: 09/27/2022 4:51:46 PM By: Adline Peals Signed: 09/27/2022 4:56:20 PM By: [name] MD FACS Entered By: Adline Peals on 09/27/2022 13:55:22 -------------------------------------------------------------------------------- Total Contact Cast  Details Patient Name: Date of Service: Evelyn Campbell, Evelyn Campbell [facility] 09/27/2022 1:30 PM Medical Record Number: [id number] Patient Account Number: [id number] Date of Birth/Sex: Treating RN: 12-16-1955 (66 y.o. [name] Primary Care Provider: [name] Other Clinician: Referring Provider: Treating Provider/Extender: [name] [name]: 0 T Contact Cast Applied for Wound Assessment: otal Wound #3 Right Foot Performed By: Physician [name], MD Post Procedure Diagnosis Same as Pre-procedure Notes scribed for Dr. Celine Ahr by Adline Peals, RN Electronic Signature(s) Signed: 09/27/2022 4:51:46 PM By: Adline Peals Signed: 09/27/2022 4:56:20 PM By: [name] MD FACS Entered By: Adline Peals on 09/27/2022 14:52:31 SuperBill Details -------------------------------------------------------------------------------- Evelyn Campbell ([id number]) [id number] Page 9 of 9 Patient Name: Date of Service: Evelyn Campbell, Evelyn Campbell [facility] 09/27/2022 Medical Record Number: [id number] Patient Account Number: [id number] Date of Birth/Sex: Treating RN: [birth year] (66 y.o. [name] Primary Care Provider: [name] Other Clinician: Referring Provider: Treating Provider/Extender: [name] [name]: 0 Diagnosis Coding ICD-10 Codes Code Description [phone] Non-pressure chronic ulcer of other part of right foot with fat layer exposed I87.2 Venous insufficiency (chronic) (peripheral) G62.9 Polyneuropathy, unspecified E66.01 Morbid (severe) obesity due to excess calories Facility Procedures CPT4 Code Description Modifier Quantity [id number] [place] VISIT-LEV 3 EST PT 25 1 [id number] - APPLY TOTAL CONTACT LEG CAST 1 ICD-10 Diagnosis Description L97.512 Non-pressure chronic ulcer of other part of right foot with fat layer exposed Physician  Procedures Quantity CPT4 Code Description Modifier [id number] - WC PHYS LEVEL 4 - EST PT 25 1 ICD-10 Diagnosis Description L97.512 Non-pressure chronic ulcer of other part of right foot with fat layer exposed I87.2 Venous insufficiency (chronic) (peripheral) G62.9 Polyneuropathy, unspecified E66.01 Morbid (severe) obesity due to excess calories [id number] - WC PHYS APPLY TOTAL CONTACT CAST 1 ICD-10 Diagnosis Description L97.512 Non-pressure chronic ulcer of other part of right foot with fat layer exposed Electronic Signature(s) Signed: 09/27/2022 3:39:55 PM By: [name] MD FACS Entered By: [name] on 09/27/2022 15:39:55

## 2022-09-27 NOTE — Progress Notes (Signed)
Evelyn, Campbell ([id number]) [id number] Page 1 of 9 Visit Report for 09/27/2022 Allergy List Details Patient Name: Date of Service: Evelyn Campbell [facility] 09/27/2022 1:30 PM Medical Record Number: [id number] Patient Account Number: [id number] Date of Birth/Sex: Treating RN: Sep 10, 1956 (66 y.o. Evelyn Campbell Primary Care Maysie Parkhill: [name] Other Clinician: Referring Shanekia Latella: Treating Laterrian Hevener/Extender: [name] [name]: 0 Allergies Active Allergies celecoxib ibuprofen meloxicam Lipitor amoxicillin penicillin Statins-HMG-CoA Reductase Inhibitors Allergy Notes Electronic Signature(s) Signed: 09/27/2022 4:51:46 PM By: Adline Peals Entered By: Adline Peals on 09/27/2022 13:54:51 -------------------------------------------------------------------------------- Arrival Information Details Patient Name: Date of Service: [name], RO [facility] 09/27/2022 1:30 PM Medical Record Number: [id number] Patient Account Number: [id number] Date of Birth/Sex: Treating RN: 1955-11-08 (66 y.o. Evelyn Campbell Primary Care Evelyn Campbell: [name] Other Clinician: Referring Harmani Neto: Treating Sophiya Morello/Extender: [name] [name]: 0 Visit Information Patient Arrived: [name] Arrival Time: 13:53 Accompanied By: self Transfer Assistance: None Patient Identification Verified: Yes Secondary Verification Process Completed: Yes Patient Has Alerts: Yes Patient Alerts: R: 1.19 History Since Last Visit Added or deleted any medications: No Any new allergies or adverse reactions: No Had a fall or experienced change in activities of daily living that may affect risk of falls: No Signs or symptoms of abuse/neglect since last visito No Hospitalized since last visit: No Implantable device outside of the clinic excluding cellular tissue based products placed in the center since last  visit: No Electronic Signature(s) WENZLER, [name] ([id number]) [id number] Page 2 of 9 Signed: 09/27/2022 4:51:46 PM By: Sabas Sous By: Adline Peals on 09/27/2022 14:10:16 -------------------------------------------------------------------------------- Clinic Level of Care Assessment Details Patient Name: Date of Service: Evelyn Campbell [facility] 09/27/2022 1:30 PM Medical Record Number: [id number] Patient Account Number: [id number] Date of Birth/Sex: Treating RN: 1956/07/03 (66 y.o. Evelyn Campbell Primary Care Evelyn Campbell: [name] Other Clinician: Referring Evelyn Campbell: Treating Evelyn Campbell/Extender: [name] [name]: 0 Clinic Level of Care Assessment Items TOOL 1 Quantity Score X- 1 0 Use when EandM and Procedure is performed on INITIAL visit ASSESSMENTS - Nursing Assessment / Reassessment X- 1 20 General Physical Exam (combine w/ comprehensive assessment (listed just below) when performed on new pt. evals) X- 1 25 Comprehensive Assessment (HX, ROS, Risk Assessments, Wounds Hx, etc.) ASSESSMENTS - Wound and Skin Assessment / Reassessment '[]'$  - 0 Dermatologic / Skin Assessment (not related to wound area) ASSESSMENTS - Ostomy and/or Continence Assessment and Care '[]'$  - 0 Incontinence Assessment and Management '[]'$  - 0 Ostomy Care Assessment and Management (repouching, etc.) PROCESS - Coordination of Care X - Simple Patient / Family Education for ongoing care 1 15 '[]'$  - 0 Complex (extensive) Patient / Family Education for ongoing care X- 1 10 Staff obtains Programmer, systems, Records, T Results / Process Orders est '[]'$  - 0 Staff telephones HHA, Nursing Homes / Clarify orders / etc '[]'$  - 0 Routine Transfer to another Facility (non-emergent condition) '[]'$  - 0 Routine Hospital Admission (non-emergent condition) X- 1 15 New Admissions / Biomedical engineer / Ordering NPWT Apligraf, etc. , '[]'$  -  0 Emergency Hospital Admission (emergent condition) PROCESS - Special Needs '[]'$  - 0 Pediatric / Minor Patient Management '[]'$  - 0 Isolation Patient Management '[]'$  - 0 Hearing / Language / Visual special needs '[]'$  - 0 Assessment of Community assistance (transportation, D/C planning, etc.) '[]'$  - 0 Additional assistance / Altered mentation '[]'$  - 0 Support Surface(s) Assessment (bed, cushion, seat, etc.) INTERVENTIONS - Miscellaneous '[]'$  - 0  External ear exam '[]'$  - 0 Patient Transfer (multiple staff / Civil Service fast streamer / Similar devices) '[]'$  - 0 Simple Staple / Suture removal (25 or less) '[]'$  - 0 Complex Staple / Suture removal (26 or more) '[]'$  - 0 Hypo/Hyperglycemic Management (do not check if billed separately) '[]'$  - 0 Ankle / Brachial Index (ABI) - do not check if billed separately Campbell, Evelyn ([id number]) [id number] Page 3 of 9 Has the patient been seen at the hospital within the last three years: Yes Total Score: 85 Level Of Care: New/Established - Level 3 Electronic Signature(s) Signed: 09/27/2022 4:51:46 PM By: Adline Peals Entered By: Adline Peals on 09/27/2022 14:52:46 -------------------------------------------------------------------------------- Encounter Discharge Information Details Patient Name: Date of Service: [name], RO [facility] 09/27/2022 1:30 PM Medical Record Number: [id number] Patient Account Number: [id number] Date of Birth/Sex: Treating RN: 1956-02-19 (66 y.o. Evelyn Campbell Primary Care Evelyn Campbell: [name] Other Clinician: Referring Alejah Aristizabal: Treating Evelyn Campbell/Extender: [name] [name]: 0 Encounter Discharge Information Items Post Procedure Vitals Discharge Condition: Stable Temperature (F): 97.7 Ambulatory Status: Cane Pulse (bpm): 66 Discharge Destination: Home Respiratory Rate (breaths/min): 18 Transportation: Private Auto Blood Pressure (mmHg): 147/90 Accompanied By:  self Schedule Follow-up Appointment: Yes Clinical Summary of Care: Patient Declined Electronic Signature(s) Signed: 09/27/2022 4:51:46 PM By: Adline Peals Entered By: Adline Peals on 09/27/2022 14:53:42 -------------------------------------------------------------------------------- Lower Extremity Assessment Details Patient Name: Date of Service: [name], RO [facility] 09/27/2022 1:30 PM Medical Record Number: [id number] Patient Account Number: [id number] Date of Birth/Sex: Treating RN: 05-06-1956 (66 y.o. Evelyn Campbell Primary Care Allyn Bartelson: [name] Other Clinician: Referring Beautiful Pensyl: Treating Adeleigh Barletta/Extender: [name] [name]: 0 Edema Assessment Assessed: [Left: No] [Right: No] [Left: Edema] [Right: :] Calf Left: Right: Point of Measurement: From Medial Instep 46.5 cm Ankle Left: Right: Point of Measurement: From Medial Instep 22 cm Vascular Assessment Pulses: Dorsalis Pedis Palpable: [Right:Yes [id number] Page 4 of 9] Electronic Signature(s) Signed: 09/27/2022 4:51:46 PM By: Adline Peals Entered By: Adline Peals on 09/27/2022 13:59:08 -------------------------------------------------------------------------------- Multi Wound Chart Details Patient Name: Date of Service: [name], RO [facility] 09/27/2022 1:30 PM Medical Record Number: [id number] Patient Account Number: [id number] Date of Birth/Sex: Treating RN: 1956-09-22 (66 y.o. F) Primary Care Marcea Rojek: [name] Other Clinician: Referring Arlyn Bumpus: Treating Jonda Alanis/Extender: [name] [name]: 0 Vital Signs Height(in): Pulse(bpm): 42 Weight(lbs): Blood Pressure(mmHg): 147/90 Body Mass Index(BMI): Temperature(F): 97.7 Respiratory Rate(breaths/min): 18 [3:Photos: No Photos Right Foot Wound Location: Gradually Appeared Wounding Event: T be determined o Primary Etiology:  Lymphedema, Hypertension, Comorbid History: Peripheral Venous Disease, Gout, Rheumatoid Arthritis, Neuropathy 09/21/2022 Date Acquired: 0 Weeks of  Treatment: Open Wound Status: No Wound Recurrence: 3.3x1.5x0.1 Measurements L x W x D (cm) 3.888 A (cm) : rea 0.389 Volume (cm) : Full Thickness Without Exposed Classification: Support Structures Medium Exudate A mount: Serosanguineous Exudate Type:  red, brown Exudate Color: Distinct, outline attached Wound Margin: Large (67-100%) Granulation A mount: Red, Pink Granulation Quality: Small (1-33%) Necrotic A mount: Fat Layer (Subcutaneous Tissue): Yes N/A Exposed Structures: Fascia: No Tendon: No  Muscle: No Joint: No Bone: No None Epithelialization: Debridement - Excisional Debridement: Pre-procedure Verification/Time Out 14:13 Taken: Lidocaine 4% Topical Solution Pain Control: Callus, Subcutaneous Tissue Debrided: Skin/Subcutaneous Tissue Level:  4.95 Debridement A (sq cm): rea Curette Instrument: Minimum Bleeding: Pressure Hemostasis A chieved: Procedure was tolerated well Debridement Treatment Response: 3.3x1.5x0.1 Post Debridement Measurements L x W x D (cm) 0.389 Post Debridement Volume:  (  cm) Callus: Yes Periwound Skin Texture: Dry/Scaly: Yes Periwound Skin Moisture: No Abnormalities Noted Periwound Skin Color: No Abnormality Temperature: Debridement Procedures Performed: T Contact Cast otal] [N/A:N/A N/A N/A N/A N/A N/A N/A N/A N/A N/A  N/A N/A N/A N/A N/A N/A N/A N/A N/A N/A N/A N/A N/A N/A N/A N/A N/A N/A N/A N/A N/A N/A N/A N/A N/A N/A N/A N/A] Treatment Notes Wound #3 (Foot) Wound Laterality: Right Cleanser Soap and Water Discharge Instruction: May shower and wash wound with dial antibacterial soap and water prior to dressing change. Wound Cleanser Discharge Instruction: Cleanse the wound with wound cleanser prior to applying a clean dressing using gauze sponges, not tissue or cotton balls. Peri-Wound Care Zinc Oxide Ointment 30g  tube Discharge Instruction: Apply Zinc Oxide to periwound with each dressing change Topical Primary Dressing KerraCel Ag Gelling Fiber Dressing, 2x2 in (silver alginate) Discharge Instruction: Apply silver alginate to wound bed as instructed Secondary Dressing Woven Gauze Sponges 2x2 in Discharge Instruction: Apply over primary dressing as directed. Zetuvit Plus Silicone Border Dressing 4x4 (in/in) Discharge Instruction: Apply silicone border over primary dressing as directed. Secured With Compression Wrap Compression Stockings Environmental education officer) Signed: 09/27/2022 3:30:06 PM By: [name] MD FACS Entered By: [name] on 09/27/2022 15:30:05 -------------------------------------------------------------------------------- Multi-Disciplinary Care Plan Details Patient Name: Date of Service: [name], RO [facility] 09/27/2022 1:30 PM Medical Record Number: [id number] Patient Account Number: [id number] Date of Birth/Sex: Treating RN: 12-09-1955 (66 y.o. Evelyn Campbell Primary Care Abdallah Hern: [name] Other Clinician: Referring Darly Massi: Treating Birgit Nowling/Extender: [name] [name]: 0 Active Inactive Peripheral Neuropathy Nursing Diagnoses: Knowledge deficit related to disease process and management of peripheral neurovascular dysfunction Potential alteration in peripheral tissue perfusion (select prior to confirmation of diagnosis) Goals: Patient/caregiver will verbalize understanding of disease process and disease management Date Initiated: 09/27/2022 Target Resolution Date: 11/05/2022 Goal Status: Active Interventions: Assess signs and symptoms of neuropathy upon admission and as needed Provide education on Management of Neuropathy and Related Ulcers Schlag, Daveigh ([id number]) [id number] Page 6 of 9 Notes: Wound/Skin Impairment Nursing Diagnoses: Impaired tissue  integrity Knowledge deficit related to ulceration/compromised skin integrity Goals: Patient/caregiver will verbalize understanding of skin care regimen Date Initiated: 09/27/2022 Target Resolution Date: 11/05/2022 Goal Status: Active Interventions: Assess ulceration(s) every visit Treatment Activities: Skin care regimen initiated : 09/27/2022 Topical wound management initiated : 09/27/2022 Notes: Electronic Signature(s) Signed: 09/27/2022 4:51:46 PM By: Adline Peals Entered By: Adline Peals on 09/27/2022 14:11:06 -------------------------------------------------------------------------------- Pain Assessment Details Patient Name: Date of Service: [name], RO [facility] 09/27/2022 1:30 PM Medical Record Number: [id number] Patient Account Number: [id number] Date of Birth/Sex: Treating RN: 1955/12/23 (66 y.o. Evelyn Campbell Primary Care Sukhraj Esquivias: [name] Other Clinician: Referring Artemis Loyal: Treating Jams Trickett/Extender: [name] [name]: 0 Active Problems Location of Pain Severity and Description of Pain Patient Has Paino Yes Site Locations Pain Location: Pain in Ulcers Duration of the Pain. Constant / Intermittento Constant Rate the pain. Current Pain Level: 5 Character of Pain Describe the Pain: Throbbing Pain Management and Medication Current Pain Management: Electronic Signature(s) PAITYN, BALSAM ([id number]) [id number] Page 7 of 9 Signed: 09/27/2022 4:51:46 PM By: Sabas Sous By: Adline Peals on 09/27/2022 14:05:55 -------------------------------------------------------------------------------- Patient/Caregiver Education Details Patient Name: Date of Service: [name], [place] 11/27/2023andnbsp1:30 PM Medical Record Number: [id number] Patient Account Number: [id number] Date of Birth/Gender: Treating RN: 03-21-1956 (66 y.o. Evelyn Campbell Primary Care  Physician: [name] Other Clinician: Referring Physician:  Treating Physician/Extender: [name] [name]: 0 Education Assessment Education Provided To: Patient Education Topics Provided Peripheral Neuropathy: Methods: Explain/Verbal Responses: Reinforcements needed, State content correctly Electronic Signature(s) Signed: 09/27/2022 4:51:46 PM By: Adline Peals Entered By: Adline Peals on 09/27/2022 14:11:19 -------------------------------------------------------------------------------- Wound Assessment Details Patient Name: Date of Service: [name], RO [facility] 09/27/2022 1:30 PM Medical Record Number: [id number] Patient Account Number: [id number] Date of Birth/Sex: Treating RN: [birth year] (66 y.o. Evelyn Campbell Primary Care Jovi Zavadil: [name] Other Clinician: Referring Erion Weightman: Treating Lily Velasquez/Extender: [name] [name]: 0 Wound Status Wound Number: 3 Primary T be determined o Etiology: Wound Location: Right Foot Wound Open Wounding Event: Gradually Appeared Status: Date Acquired: 09/21/2022 Comorbid Lymphedema, Hypertension, Peripheral Venous Disease, Gout, Weeks Of Treatment: 0 History: Rheumatoid Arthritis, Neuropathy Clustered Wound: No Wound Measurements Length: (cm) 3.3 Width: (cm) 1.5 Depth: (cm) 0.1 Area: (cm) 3.888 Volume: (cm) 0.389 % Reduction in Area: % Reduction in Volume: Epithelialization: None Tunneling: No Undermining: No Wound Description Classification: Full Thickness Without Exposed Support Structures Wound Margin: Distinct, outline attached Exudate Amount: Medium Exudate Type: Serosanguineous Mccown, Nevelyn ([id number]) Exudate Color: red, brown Foul Odor After Cleansing: No Slough/Fibrino Yes [id number] Page 8 of 9 Wound Bed Granulation Amount: Large (67-100%) Exposed Structure Granulation Quality:  Red, Pink Fascia Exposed: No Necrotic Amount: Small (1-33%) Fat Layer (Subcutaneous Tissue) Exposed: Yes Necrotic Quality: Adherent Slough Tendon Exposed: No Muscle Exposed: No Joint Exposed: No Bone Exposed: No Periwound Skin Texture Texture Color No Abnormalities Noted: No No Abnormalities Noted: Yes Callus: Yes Temperature / Pain Temperature: No Abnormality Moisture No Abnormalities Noted: No Dry / Scaly: Yes Treatment Notes Wound #3 (Foot) Wound Laterality: Right Cleanser Soap and Water Discharge Instruction: May shower and wash wound with dial antibacterial soap and water prior to dressing change. Wound Cleanser Discharge Instruction: Cleanse the wound with wound cleanser prior to applying a clean dressing using gauze sponges, not tissue or cotton balls. Peri-Wound Care Zinc Oxide Ointment 30g tube Discharge Instruction: Apply Zinc Oxide to periwound with each dressing change Topical Primary Dressing KerraCel Ag Gelling Fiber Dressing, 2x2 in (silver alginate) Discharge Instruction: Apply silver alginate to wound bed as instructed Secondary Dressing Woven Gauze Sponges 2x2 in Discharge Instruction: Apply over primary dressing as directed. Zetuvit Plus Silicone Border Dressing 4x4 (in/in) Discharge Instruction: Apply silicone border over primary dressing as directed. Secured With Compression Wrap Compression Stockings Environmental education officer) Signed: 09/27/2022 4:51:46 PM By: Adline Peals Entered By: Adline Peals on 09/27/2022 14:03:04 -------------------------------------------------------------------------------- Vitals Details Patient Name: Date of Service: [name], [place] 09/27/2022 1:30 PM Medical Record Number: [id number] Patient Account Number: [id number] Date of Birth/Sex: Treating RN: [birth year] (66 y.o. Evelyn Campbell Primary Care Aditya Nastasi: [name] Other Clinician: Referring Marguriete Wootan: Treating Deantae Shackleton/Extender:  [name] [name]: [place] ([id number]) [id number] Page 9 of 9 Vital Signs Time Taken: 13:53 Temperature (F): 97.7 Pulse (bpm): 66 Respiratory Rate (breaths/min): 18 Blood Pressure (mmHg): 147/90 Reference Range: 80 - 120 mg / dl Electronic Signature(s) Signed: 09/27/2022 4:51:46 PM By: Adline Peals Entered By: Adline Peals on 09/27/2022 13:54:44

## 2022-09-27 NOTE — Progress Notes (Signed)
[place], Evelyn Campbell ([id number]) [id number] Page 1 of 4 Visit Report for 09/27/2022 Abuse Risk Screen Details Patient Name: Date of Service: Evelyn Campbell [facility] 09/27/2022 1:30 PM Medical Record Number: [id number] Patient Account Number: [id number] Date of Birth/Sex: Treating RN: [birth year] (66 y.o. Evelyn Campbell Primary Care Retina Bernardy: [name] Other Clinician: Referring Albion Weatherholtz: Treating Candi Profit/Extender: [name] [name]: 0 Abuse Risk Screen Items Answer ABUSE RISK SCREEN: Has anyone close to you tried to hurt or harm you recentlyo No Do you feel uncomfortable with anyone in your familyo No Has anyone forced you do things that you didnt want to doo No Electronic Signature(s) Signed: 09/27/2022 4:51:46 PM By: Adline Peals Entered By: Adline Peals on 09/27/2022 13:55:28 -------------------------------------------------------------------------------- Activities of Daily Living Details Patient Name: Date of Service: Evelyn Campbell [facility] 09/27/2022 1:30 PM Medical Record Number: [id number] Patient Account Number: [id number] Date of Birth/Sex: Treating RN: 10-May-1956 (66 y.o. Evelyn Campbell Primary Care Alaiyah Bollman: [name] Other Clinician: Referring Kyelle Urbas: Treating Hanley Rispoli/Extender: [name] [name]: 0 Activities of Daily Living Items Answer Activities of Daily Living (Please select one for each item) Drive Automobile Completely Able T Medications ake Completely Able Use T elephone Completely Able Care for Appearance Completely Able Use T oilet Completely Able Bath / Shower Completely Able Dress Self Completely Able Feed Self Completely Able Walk Need Assistance Get In / Out Bed Completely Able Housework Completely Able Prepare Meals Completely [place] for Self Completely Able Electronic  Signature(s) Signed: 09/27/2022 4:51:46 PM By: Adline Peals Entered By: Adline Peals on 09/27/2022 13:55:59 [place], Evelyn Campbell ([id number]) [id number] Page 2 of 4 -------------------------------------------------------------------------------- Education Screening Details Patient Name: Date of Service: Evelyn [facility] 09/27/2022 1:30 PM Medical Record Number: [id number] Patient Account Number: [id number] Date of Birth/Sex: Treating RN: [birth year] (66 y.o. Evelyn Campbell Primary Care Delcia Spitzley: [name] Other Clinician: Referring Jahari Wiginton: Treating Jyles Sontag/Extender: [name] [name]: 0 Primary Learner Assessed: Patient Learning Preferences/Education Level/Primary Language Learning Preference: Explanation, Demonstration, Video, Printed Material Highest Education Level: College or Above Preferred Language: English Cognitive Barrier Language Barrier: No Translator Needed: No Memory Deficit: No Emotional Barrier: No Cultural/Religious Beliefs Affecting Medical Care: No Physical Barrier Impaired Vision: Yes Glasses Impaired Hearing: No Decreased Hand dexterity: No Knowledge/Comprehension Knowledge Level: Medium Comprehension Level: Medium Ability to understand written instructions: Medium Ability to understand verbal instructions: Medium Motivation Anxiety Level: Calm Cooperation: Cooperative Education Importance: Acknowledges Need Interest in Health Problems: Asks Questions Perception: Coherent Willingness to Engage in Self-Management Medium Activities: Readiness to Engage in Self-Management Medium Activities: Electronic Signature(s) Signed: 09/27/2022 4:51:46 PM By: Adline Peals Entered By: Adline Peals on 09/27/2022 13:56:20 -------------------------------------------------------------------------------- Fall Risk Assessment Details Patient Name: Date of  Service: [name], RO [facility] 09/27/2022 1:30 PM Medical Record Number: [id number] Patient Account Number: [id number] Date of Birth/Sex: Treating RN: [birth year] (66 y.o. Evelyn Campbell Primary Care Teeghan Hammer: [name] Other Clinician: Referring Ethyle Tiedt: Treating Nikoleta Dady/Extender: [name] [name]: 0 Fall Risk Assessment Items Have you had 2 or more falls in the last 12 monthso 0 No Weekes, Maedell ([id number]) [phone] [id number] Page 3 of 4 Have you had any fall that resulted in injury in the last 12 monthso 0 No FALLS RISK SCREEN History of falling - immediate or within 3 months 0 No Secondary diagnosis (Do you have 2 or more medical diagnoseso) 15 Yes Ambulatory  aid None/bed rest/wheelchair/nurse 0 No Crutches/cane/walker 15 Yes Furniture 0 No Intravenous therapy Access/Saline/Heparin Lock 0 No Gait/Transferring Normal/ bed rest/ wheelchair 0 Yes Weak (short steps with or without shuffle, stooped but able to lift head while walking, may seek 0 No support from furniture) Impaired (short steps with shuffle, may have difficulty arising from chair, head down, impaired 0 No balance) Mental Status Oriented to own ability 0 Yes Electronic Signature(s) Signed: 09/27/2022 4:51:46 PM By: Adline Peals Entered By: Adline Peals on 09/27/2022 13:56:44 -------------------------------------------------------------------------------- Foot Assessment Details Patient Name: Date of Service: [name], RO [facility] 09/27/2022 1:30 PM Medical Record Number: [id number] Patient Account Number: [id number] Date of Birth/Sex: Treating RN: 12-26-1955 (66 y.o. Evelyn Campbell Primary Care Lakera Viall: [name] Other Clinician: Referring Trenee Igoe: Treating Casson Catena/Extender: [name] [name]: 0 Foot Assessment Items Site Locations + = Sensation present, - = Sensation  absent, C = Callus, U = Ulcer R = Redness, W = Warmth, M = Maceration, PU = Pre-ulcerative lesion F = Fissure, S = Swelling, D = Dryness Assessment Right: Left: Other Deformity: No No Prior Foot Ulcer: No No Prior Amputation: No No Charcot Joint: No No Ambulatory Status: Ambulatory With Help Assistance DeviceStellar Gensel, Evelyn Campbell ([id number]) [id number] Page 4 of 4 Gait: Steady Electronic Signature(s) Signed: 09/27/2022 4:51:46 PM By: Adline Peals Entered By: Adline Peals on 09/27/2022 13:57:17 -------------------------------------------------------------------------------- Nutrition Risk Screening Details Patient Name: Date of Service: Evelyn Campbell [facility] 09/27/2022 1:30 PM Medical Record Number: [id number] Patient Account Number: [id number] Date of Birth/Sex: Treating RN: Aug 12, 1956 (66 y.o. Evelyn Campbell Primary Care Lowen Mansouri: [name] Other Clinician: Referring Cearra Portnoy: Treating Khamiya Varin/Extender: [name] [name]: 0 Height (in): Weight (lbs): Body Mass Index (BMI): Nutrition Risk Screening Items Score Screening NUTRITION RISK SCREEN: I have an illness or condition that made me change the kind and/or amount of food I eat 0 No I eat fewer than two meals per day 0 No I eat few fruits and vegetables, or milk products 0 No I have three or more drinks of beer, liquor or wine almost every day 0 No I have tooth or mouth problems that make it hard for me to eat 0 No I don't always have enough money to buy the food I need 0 No I eat alone most of the time 0 No I take three or more different prescribed or over-the-counter drugs a day 1 Yes Without wanting to, I have lost or gained 10 pounds in the last six months 0 No I am not always physically able to shop, cook and/or feed myself 0 No Nutrition Protocols Good Risk Protocol 0 No interventions needed Moderate Risk Protocol High  Risk Proctocol Risk Level: Good Risk Score: 1 Electronic Signature(s) Signed: 09/27/2022 4:51:46 PM By: Adline Peals Entered By: Adline Peals on 09/27/2022 13:57:05

## 2022-09-28 ENCOUNTER — Ambulatory Visit (HOSPITAL_BASED_OUTPATIENT_CLINIC_OR_DEPARTMENT_OTHER): Payer: Medicare Other | Admitting: General Surgery

## 2022-10-04 ENCOUNTER — Encounter (HOSPITAL_BASED_OUTPATIENT_CLINIC_OR_DEPARTMENT_OTHER): Payer: Medicare Other | Attending: General Surgery | Admitting: General Surgery

## 2022-10-04 DIAGNOSIS — L97512 Non-pressure chronic ulcer of other part of right foot with fat layer exposed: Secondary | ICD-10-CM | POA: Insufficient documentation

## 2022-10-04 DIAGNOSIS — R7303 Prediabetes: Secondary | ICD-10-CM | POA: Diagnosis not present

## 2022-10-04 DIAGNOSIS — E8881 Metabolic syndrome: Secondary | ICD-10-CM | POA: Diagnosis not present

## 2022-10-04 DIAGNOSIS — I872 Venous insufficiency (chronic) (peripheral): Secondary | ICD-10-CM | POA: Insufficient documentation

## 2022-10-04 DIAGNOSIS — I10 Essential (primary) hypertension: Secondary | ICD-10-CM | POA: Insufficient documentation

## 2022-10-04 DIAGNOSIS — I890 Lymphedema, not elsewhere classified: Secondary | ICD-10-CM | POA: Diagnosis not present

## 2022-10-04 DIAGNOSIS — M069 Rheumatoid arthritis, unspecified: Secondary | ICD-10-CM | POA: Insufficient documentation

## 2022-10-04 NOTE — Progress Notes (Signed)
DONNAMARIE, SHANKLES ([id number]) [id number] Page 1 of 9 Visit Report for 10/04/2022 Chief Complaint Document Details Patient Name: Date of Service: Evelyn Campbell [facility] 10/04/2022 12:45 PM Medical Record Number: [id number] Patient Account Number: [id number] Date of Birth/Sex: Treating RN: 1956-01-01 (66 y.o. F) Primary Care Provider: [name] Other Clinician: Referring Provider: Treating Provider/Extender: [name] [name]: 1 Information Obtained from: Patient Chief Complaint Patient seen for complaints of Non-Healing Wound. 09/27/2022: re-opening of wound R foot Electronic Signature(s) Signed: 10/04/2022 1:47:48 PM By: [name] MD FACS Entered By: [name] on 10/04/2022 13:47:48 -------------------------------------------------------------------------------- HPI Details Patient Name: Date of Service: [name], [place] 10/04/2022 12:45 PM Medical Record Number: [id number] Patient Account Number: [id number] Date of Birth/Sex: Treating RN: 1956/03/19 (66 y.o. F) Primary Care Provider: [name] Other Clinician: Referring Provider: Treating Provider/Extender: [name] [name]: 1 History of Present Illness HPI Description: ADMISSION 01/21/2022 This is a 66 year old woman presenting with an ulcer on the right fifth metatarsal head. She says that it has been present for about 6 weeks. She is not certain how it developed, but she does have peripheral neuropathy. She also has lymphedema, hypertension, metabolic syndrome/prediabetes, but does not carry an actual diagnosis of diabetes nor is she on any medications for this. 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 ABI in clinic today was 0.8. She has been treated in the past for lymphedema, but has been unable to tolerate compression. She does have lymphedema pumps but does not use  these regularly. She had been applying Neosporin and new skin to her wound, but it did not really change much. She was referred here by her primary care provider for further evaluation and management. 01/28/2022: 1 week follow-up. Apparently there was some miscommunication with home health and they did not come out to help her with her dressing changes. The patient and her husband have been doing them themselves. The wound actually looks quite good, with decreased dimensions and a very nice healthy surface. 02/05/2022: The home health nurse came out during the week and the patient was happy with the care. The wound is smaller today with a clean base. 02/12/2022: The wound continues to contract and is quite small today. There is a bit of callus buildup, but the wound surface itself is very clean. 02/19/2022: The wound is nearly closed. There is a small amount of callus buildup. No slough or drainage. 02/25/2022: The wound is down to just a sliver. She continues to build up callus. No concern for infection. 03/04/2022: The wound is closed. READMISSION 04/12/2022 Last week, the patient noticed a spot of blood on her stocking where her wound had been. She noted that the wound was open. She began applying the Prisma silver collagen that we had used during her last admission. She wrapped it up and on Friday when she changed the dressing, she noticed that there was a new wound on the dorsal part of her right foot overlying the fifth metatarsal. Today it is quite red and tender. There is slough on the surface. The wound on the plantar surface has periwound callus accumulation but otherwise appears fairly clean. CAEDYN, TASSINARI ([id number]) [id number] Page 2 of 9 04/19/2022: The culture that we took last week somehow has been lost and we have no results. The patient has been applying topical gentamicin and silver alginate to her wound, however and both of them looks smaller. The redness  and  tenderness has improved significantly. Her foot remains macerated in appearance, however. She had some bleeding from the plantar wound. On inspection, it looks like secondary to moisture, the surrounding skin and callus have lifted. When I probed with a thin cotton applicator, this created some minor bleeding and I think this is likely what happened. The tissue is just quite friable. 04/22/2022: The culture data were finally found and she did grow Proteus mirabilis from her wound. I prescribed Bactrim for her. She is here today for her first cast change. Both wound sites look cleaner and smaller today even after just a few days of casting. 04/26/2022: Both wounds are smaller and very clean. She is completing a course of Bactrim for Proteus mirabilis grown from her wound. 04/29/2022: The patient contacted our office yesterday concerned that the cast was pushing too hard against her great toe. She is here today to have the cast removed and a new one reapplied. The wound on the dorsal lateral part of her foot is closed. The wound on the plantar surface of the fifth metatarsal head is smaller and epithelializing. 05/06/2022: Her wounds are closed. READMISSION 09/27/2022 She returns with reopening of her wound on the right fifth metatarsal area after she healed in July, she was seen by Dr. Milinda Pointer at [facility]. She was apparently wearing accommodative orthotics that were in good condition and he recommended that she continue to wear those. He also identified that her shoes seemed narrow and that she should consider a wider shoe. We had recommended that she wear a callus pad or a foam donut of sorts to protect the area but she has not been doing this. She is wearing wide shoes but despite this, over the past week, she says that she opened up the wound and the exact same location as before. On inspection, it appears that she probably had developed a large callus that ultimately broke down deep to  the surface. The tissue is macerated and the fat layer is exposed. There is no purulent drainage or malodor. No visible signs of infection. 10/04/2022: Even after just a week, there has been a substantial improvement in her wound. There has been epithelialization such that it has split into 2 separate openings. There is some slough accumulation on the surface but the periwound is in better condition. Electronic Signature(s) Signed: 10/04/2022 1:48:30 PM By: [name] MD FACS Entered By: [name] on 10/04/2022 13:48:30 -------------------------------------------------------------------------------- Physical Exam Details Patient Name: Date of Service: [name], RO [facility] 10/04/2022 12:45 PM Medical Record Number: [id number] Patient Account Number: [id number] Date of Birth/Sex: Treating RN: Nov 20, 1955 (66 y.o. F) Primary Care Provider: [name] Other Clinician: Referring Provider: Treating Provider/Extender: [name] [name]: 1 Constitutional . . . . No acute distress. Respiratory Normal work of breathing on room air. Notes 10/04/2022: There has been a substantial improvement in her wound. There has been epithelialization such that it has split into 2 separate openings. There is some slough accumulation on the surface but the periwound is in better condition. Electronic Signature(s) Signed: 10/04/2022 1:49:18 PM By: [name] MD FACS Entered By: [name] on 10/04/2022 13:49:18 -------------------------------------------------------------------------------- Physician Orders Details Patient Name: Date of Service: [name], [place] 10/04/2022 12:45 PM Medical Record Number: [id number] Patient Account Number: [id number] Date of Birth/Sex: Treating RN: [birth year] (66 y.o. [name] [place], [place] ([id number]) [phone].pdf Page 3 of 9 Primary Care Provider: [name] Other  Clinician: Referring Provider: Treating  Provider/Extender: [name] [name]: 1 Verbal / Phone Orders: No Diagnosis Coding ICD-10 Coding Code Description [phone] Non-pressure chronic ulcer of other part of right foot with fat layer exposed I87.2 Venous insufficiency (chronic) (peripheral) G62.9 Polyneuropathy, unspecified E66.01 Morbid (severe) obesity due to excess calories Follow-up Appointments ppointment in 1 week. - Dr. Celine Ahr - room 2 Return A Anesthetic (In clinic) Topical Lidocaine 4% applied to wound bed Bathing/ Shower/ Hygiene May shower with protection but do not get wound dressing(s) wet. - Please use cast protector (Can purchase cast protector from local pharmacy, such as [facility] or [facility]) Edema Control - Lymphedema / SCD / Other Elevate legs to the level of the heart or above for 30 minutes daily and/or when sitting, a frequency of: Avoid standing for long periods of time. Patient to wear own compression stockings every day. - L left Exercise regularly Off-Loading Total Contact Cast to Right Lower Extremity Removable cast walker boot to: - right foot Wound Treatment Wound #3 - Foot Wound Laterality: Right Cleanser: Soap and Water 1 x Per Week/30 Days Discharge Instructions: May shower and wash wound with dial antibacterial soap and water prior to dressing change. Cleanser: Wound Cleanser 1 x Per Week/30 Days Discharge Instructions: Cleanse the wound with wound cleanser prior to applying a clean dressing using gauze sponges, not tissue or cotton balls. Peri-Wound Care: Zinc Oxide Ointment 30g tube 1 x Per Week/30 Days Discharge Instructions: Apply Zinc Oxide to periwound with each dressing change Prim Dressing: KerraCel Ag Gelling Fiber Dressing, 2x2 in (silver alginate) 1 x Per Week/30 Days ary Discharge Instructions: Apply silver alginate to wound bed as instructed Secondary Dressing: Woven Gauze Sponges 2x2 in 1 x Per Week/30  Days Discharge Instructions: Apply over primary dressing as directed. Secondary Dressing: Zetuvit Plus Silicone Border Dressing 4x4 (in/in) 1 x Per Week/30 Days Discharge Instructions: Apply silicone border over primary dressing as directed. Patient Medications llergies: celecoxib, ibuprofen, meloxicam, Lipitor, amoxicillin, penicillin, Statins-HMG-CoA Reductase Inhibitors A Notifications Medication Indication Start End 10/04/2022 lidocaine DOSE topical 4 % cream - cream topical Electronic Signature(s) Signed: 10/04/2022 4:47:46 PM By: [name] MD FACS Entered By: [name] on 10/04/2022 13:49:30 Vida Rigger, [name] ([id number]) [id number] Page 4 of 9 -------------------------------------------------------------------------------- Problem List Details Patient Name: Date of Service: Evelyn Campbell [facility] 10/04/2022 12:45 PM Medical Record Number: [id number] Patient Account Number: [id number] Date of Birth/Sex: Treating RN: 12-May-1956 (66 y.o. F) Primary Care Provider: [name] Other Clinician: Referring Provider: Treating Provider/Extender: [name] [name]: 1 Active Problems ICD-10 Encounter Code Description Active Date MDM Diagnosis [phone] Non-pressure chronic ulcer of other part of right foot with fat layer exposed 09/27/2022 No Yes I87.2 Venous insufficiency (chronic) (peripheral) 09/27/2022 No Yes G62.9 Polyneuropathy, unspecified 09/27/2022 No Yes E66.01 Morbid (severe) obesity due to excess calories 09/27/2022 No Yes Inactive Problems Resolved Problems Electronic Signature(s) Signed: 10/04/2022 1:47:34 PM By: [name] MD FACS Entered By: [name] on 10/04/2022 13:47:33 -------------------------------------------------------------------------------- Progress Note Details Patient Name: Date of Service: [name], [place] 10/04/2022 12:45 PM Medical Record Number:  [id number] Patient Account Number: [id number] Date of Birth/Sex: Treating RN: July 07, 1956 (66 y.o. F) Primary Care Provider: [name] Other Clinician: Referring Provider: Treating Provider/Extender: [name] [name]: 1 Subjective Chief Complaint Information obtained from Patient Patient seen for complaints of Non-Healing Wound. 09/27/2022: re-opening of wound R foot History of Present Illness (HPI) ADMISSION 01/21/2022 This is a 66 year old woman presenting  with an ulcer on the right fifth metatarsal head. She says that it has been present for about 6 weeks. She is not certain how it developed, but she does have peripheral neuropathy. She also has lymphedema, hypertension, metabolic syndrome/prediabetes, but does not carry an actual diagnosis of diabetes nor is she on any medications for this. 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 ABI in [place], [place] ([id number]) [id number] Page 5 of 9 clinic today was 0.8. She has been treated in the past for lymphedema, but has been unable to tolerate compression. She does have lymphedema pumps but does not use these regularly. She had been applying Neosporin and new skin to her wound, but it did not really change much. She was referred here by her primary care provider for further evaluation and management. 01/28/2022: 1 week follow-up. Apparently there was some miscommunication with home health and they did not come out to help her with her dressing changes. The patient and her husband have been doing them themselves. The wound actually looks quite good, with decreased dimensions and a very nice healthy surface. 02/05/2022: The home health nurse came out during the week and the patient was happy with the care. The wound is smaller today with a clean base. 02/12/2022: The wound continues to contract and is quite small today. There is a bit of  callus buildup, but the wound surface itself is very clean. 02/19/2022: The wound is nearly closed. There is a small amount of callus buildup. No slough or drainage. 02/25/2022: The wound is down to just a sliver. She continues to build up callus. No concern for infection. 03/04/2022: The wound is closed. READMISSION 04/12/2022 Last week, the patient noticed a spot of blood on her stocking where her wound had been. She noted that the wound was open. She began applying the Prisma silver collagen that we had used during her last admission. She wrapped it up and on Friday when she changed the dressing, she noticed that there was a new wound on the dorsal part of her right foot overlying the fifth metatarsal. Today it is quite red and tender. There is slough on the surface. The wound on the plantar surface has periwound callus accumulation but otherwise appears fairly clean. 04/19/2022: The culture that we took last week somehow has been lost and we have no results. The patient has been applying topical gentamicin and silver alginate to her wound, however and both of them looks smaller. The redness and tenderness has improved significantly. Her foot remains macerated in appearance, however. She had some bleeding from the plantar wound. On inspection, it looks like secondary to moisture, the surrounding skin and callus have lifted. When I probed with a thin cotton applicator, this created some minor bleeding and I think this is likely what happened. The tissue is just quite friable. 04/22/2022: The culture data were finally found and she did grow Proteus mirabilis from her wound. I prescribed Bactrim for her. She is here today for her first cast change. Both wound sites look cleaner and smaller today even after just a few days of casting. 04/26/2022: Both wounds are smaller and very clean. She is completing a course of Bactrim for Proteus mirabilis grown from her wound. 04/29/2022: The patient contacted our  office yesterday concerned that the cast was pushing too hard against her great toe. She is here today to have the cast removed and a new one reapplied. The wound on the dorsal lateral part of  her foot is closed. The wound on the plantar surface of the fifth metatarsal head is smaller and epithelializing. 05/06/2022: Her wounds are closed. READMISSION 09/27/2022 She returns with reopening of her wound on the right fifth metatarsal area after she healed in July, she was seen by Dr. Milinda Pointer at [facility]. She was apparently wearing accommodative orthotics that were in good condition and he recommended that she continue to wear those. He also identified that her shoes seemed narrow and that she should consider a wider shoe. We had recommended that she wear a callus pad or a foam donut of sorts to protect the area but she has not been doing this. She is wearing wide shoes but despite this, over the past week, she says that she opened up the wound and the exact same location as before. On inspection, it appears that she probably had developed a large callus that ultimately broke down deep to the surface. The tissue is macerated and the fat layer is exposed. There is no purulent drainage or malodor. No visible signs of infection. 10/04/2022: Even after just a week, there has been a substantial improvement in her wound. There has been epithelialization such that it has split into 2 separate openings. There is some slough accumulation on the surface but the periwound is in better condition. Patient History Information obtained from Patient. Family History Heart Disease - Father, No family history of Cancer, Diabetes, Hereditary Spherocytosis, Hypertension, Kidney Disease, Lung Disease, Seizures, Stroke, Thyroid Problems, Tuberculosis. Social History Never smoker, Marital Status - Married, Alcohol Use - Never, Drug Use - No History, Caffeine Use - Never. Medical  History Hematologic/Lymphatic Patient has history of Lymphedema Cardiovascular Patient has history of Hypertension, Peripheral Venous Disease Musculoskeletal Patient has history of Gout, Rheumatoid Arthritis - ankle Neurologic Patient has history of Neuropathy - Bilateral feet Hospitalization/Surgery History - Hernia repair (2016);Right ankle reconstruction. - insertion of mesh. - pancreatectomy. - toe surgery bilateral. - cholecystectomy. - abdominal hysterectomy. - tonsillectomy. - wrist surgery. Medical A Surgical History Notes nd Constitutional Symptoms (General Health) obesity Hematologic/Lymphatic Gout, vitamin D deficiency Cardiovascular PEriperal venous insufficiency Gastrointestinal Diverticular disease of colon: GERD, fatty liver Endocrine Prediabetes Neurologic Flater, Kellin ([id number]) [id number] Page 6 of 9 Idiopathic peripheral neuropathy Objective Constitutional No acute distress. Vitals Time Taken: 1:24 PM, Temperature: 97.5 F, Pulse: 65 bpm, Respiratory Rate: 18 breaths/min, Blood Pressure: 132/89 mmHg. Respiratory Normal work of breathing on room air. General Notes: 10/04/2022: There has been a substantial improvement in her wound. There has been epithelialization such that it has split into 2 separate openings. There is some slough accumulation on the surface but the periwound is in better condition. Integumentary (Hair, Skin) Wound #3 status is Open. Original cause of wound was Gradually Appeared. The date acquired was: 09/21/2022. The wound has been in treatment 1 weeks. The wound is located on the Right Foot. The wound measures 0.7cm length x 1.5cm width x 0.1cm depth; 0.825cm^2 area and 0.082cm^3 volume. There is Fat Layer (Subcutaneous Tissue) exposed. There is no tunneling or undermining noted. There is a medium amount of serosanguineous drainage noted. The wound margin is distinct with the outline attached to the wound  base. There is medium (34-66%) red, pink granulation within the wound bed. There is a medium (34-66%) amount of necrotic tissue within the wound bed including Eschar and Adherent Slough. The periwound skin appearance had no abnormalities noted for color. The periwound skin appearance exhibited: Callus, Dry/Scaly. Periwound temperature was noted as No  Abnormality. Assessment Active Problems ICD-10 Non-pressure chronic ulcer of other part of right foot with fat layer exposed Venous insufficiency (chronic) (peripheral) Polyneuropathy, unspecified Morbid (severe) obesity due to excess calories Procedures Wound #3 Pre-procedure diagnosis of Wound #3 is a T be determined located on the Right Foot . There was a T Contact Cast Procedure by [name], MD. o otal Post procedure Diagnosis Wound #3: Same as Pre-Procedure Notes: scribed for Dr. Celine Ahr by Adline Peals, RN. Plan Follow-up Appointments: Return Appointment in 1 week. - Dr. Celine Ahr - room 2 Anesthetic: (In clinic) Topical Lidocaine 4% applied to wound bed Bathing/ Shower/ Hygiene: May shower with protection but do not get wound dressing(s) wet. - Please use cast protector (Can purchase cast protector from local pharmacy, such as [facility] or [facility]) Edema Control - Lymphedema / SCD / Other: Elevate legs to the level of the heart or above for 30 minutes daily and/or when sitting, a frequency of: Avoid standing for long periods of time. Patient to wear own compression stockings every day. - L left Exercise regularly Off-Loading: T Contact Cast to Right Lower Extremity otal Removable cast walker boot to: - right foot The following medication(s) was prescribed: lidocaine topical 4 % cream cream topical was prescribed at facility WOUND #3: - Foot Wound Laterality: Right Cleanser: Soap and Water 1 x Per Week/30 Days Discharge Instructions: May shower and wash wound with dial antibacterial soap and water prior to dressing  change. Cleanser: Wound Cleanser 1 x Per Week/30 Days Bajorek, Kathaleen ([id number]) [phone].pdf Page 7 of 9 Discharge Instructions: Cleanse the wound with wound cleanser prior to applying a clean dressing using gauze sponges, not tissue or cotton balls. Peri-Wound Care: Zinc Oxide Ointment 30g tube 1 x Per Week/30 Days Discharge Instructions: Apply Zinc Oxide to periwound with each dressing change Prim Dressing: KerraCel Ag Gelling Fiber Dressing, 2x2 in (silver alginate) 1 x Per Week/30 Days ary Discharge Instructions: Apply silver alginate to wound bed as instructed Secondary Dressing: Woven Gauze Sponges 2x2 in 1 x Per Week/30 Days Discharge Instructions: Apply over primary dressing as directed. Secondary Dressing: Zetuvit Plus Silicone Border Dressing 4x4 (in/in) 1 x Per Week/30 Days Discharge Instructions: Apply silicone border over primary dressing as directed. 10/04/2022: There has been a substantial improvement in her wound. There has been epithelialization such that it has split into 2 separate openings. There is some slough accumulation on the surface but the periwound is in better condition. The wound bed was cleaned. We then applied generous periwound zinc oxide, silver alginate as a contact layer, a Zetuvit pad, and then a total contact cast was applied in standard fashion. Follow-up in 1 week. Electronic Signature(s) Signed: 10/04/2022 1:52:06 PM By: [name] MD FACS Entered By: [name] on 10/04/2022 13:52:06 -------------------------------------------------------------------------------- HxROS Details Patient Name: Date of Service: [name], [place] 10/04/2022 12:45 PM Medical Record Number: [id number] Patient Account Number: [id number] Date of Birth/Sex: Treating RN: Aug 26, 1956 (66 y.o. F) Primary Care Provider: [name] Other Clinician: Referring Provider: Treating Provider/Extender: [name] [name]: 1 Information Obtained From Patient Constitutional Symptoms (General Health) Medical History: Past Medical History Notes: obesity Hematologic/Lymphatic Medical History: Positive for: Lymphedema Past Medical History Notes: Gout, vitamin D deficiency Cardiovascular Medical History: Positive for: Hypertension; Peripheral Venous Disease Past Medical History Notes: PEriperal venous insufficiency Gastrointestinal Medical History: Past Medical History Notes: Diverticular disease of colon: GERD, fatty liver Endocrine Medical History: Past Medical History Notes: Prediabetes Musculoskeletal Medical History: Frieson, Parthenia (  [id number]) [id number] Page 8 of 9 Positive for: Gout; Rheumatoid Arthritis - ankle Neurologic Medical History: Positive for: Neuropathy - Bilateral feet Past Medical History Notes: Idiopathic peripheral neuropathy Immunizations Pneumococcal Vaccine: Received Pneumococcal Vaccination: Yes Received Pneumococcal Vaccination On or After 60th Birthday: Yes Implantable Devices None Hospitalization / Surgery History Type of Hospitalization/Surgery Hernia repair (2016);Right ankle reconstruction insertion of mesh pancreatectomy toe surgery bilateral cholecystectomy abdominal hysterectomy tonsillectomy wrist surgery Family and Social History Cancer: No; Diabetes: No; Heart Disease: Yes - Father; Hereditary Spherocytosis: No; Hypertension: No; Kidney Disease: No; Lung Disease: No; Seizures: No; Stroke: No; Thyroid Problems: No; Tuberculosis: No; Never smoker; Marital Status - Married; Alcohol Use: Never; Drug Use: No History; Caffeine Use: Never; Financial Concerns: No; Food, Clothing or Shelter Needs: No; Support System Lacking: No; Transportation Concerns: No Electronic Signature(s) Signed: 10/04/2022 4:47:46 PM By: [name] MD FACS Entered By: [name] on 10/04/2022  13:48:39 -------------------------------------------------------------------------------- Total Contact Cast Details Patient Name: Date of Service: [name], RO [facility] 10/04/2022 12:45 PM Medical Record Number: [id number] Patient Account Number: [id number] Date of Birth/Sex: Treating RN: 29-Sep-1956 (66 y.o. [name] Primary Care Provider: [name] Other Clinician: Referring Provider: Treating Provider/Extender: [name] [name]: 1 T Contact Cast Applied for Wound Assessment: otal Wound #3 Right Foot Performed By: Physician [name], MD Post Procedure Diagnosis Same as Pre-procedure Notes scribed for Dr. Celine Ahr by Adline Peals, RN Electronic Signature(s) Signed: 10/04/2022 4:47:46 PM By: [name] MD FACS Signed: 10/04/2022 4:56:57 PM By: Adline Peals Entered By: Adline Peals on 10/04/2022 13:40:13 [name] ([id number]) [id number] Page 9 of 9 -------------------------------------------------------------------------------- SuperBill Details Patient Name: Date of Service: Latanya Maudlin ND, Carolyn Stare [facility] 10/04/2022 Medical Record Number: [id number] Patient Account Number: [id number] Date of Birth/Sex: Treating RN: [birth year] (66 y.o. F) Primary Care Provider: [name] Other Clinician: Referring Provider: Treating Provider/Extender: [name] [name]: 1 Diagnosis Coding ICD-10 Codes Code Description [phone] Non-pressure chronic ulcer of other part of right foot with fat layer exposed I87.2 Venous insufficiency (chronic) (peripheral) G62.9 Polyneuropathy, unspecified E66.01 Morbid (severe) obesity due to excess calories Facility Procedures : CPT4 Code: [id number] Description: 29445 - APPLY TOTAL CONTACT LEG CAST ICD-10 Diagnosis Description L97.512 Non-pressure chronic ulcer of other part of right foot with fat layer  exposed Modifier: Quantity: 1 Physician Procedures : CPT4 Code Description Modifier [id number] - WC PHYS LEVEL 3 - EST PT ICD-10 Diagnosis Description L97.512 Non-pressure chronic ulcer of other part of right foot with fat layer exposed G62.9 Polyneuropathy, unspecified I87.2 Venous insufficiency  (chronic) (peripheral) E66.01 Morbid (severe) obesity due to excess calories Quantity: 1 : [id number] - WC PHYS APPLY TOTAL CONTACT CAST ICD-10 Diagnosis Description L97.512 Non-pressure chronic ulcer of other part of right foot with fat layer exposed Quantity: 1 Electronic Signature(s) Signed: 10/04/2022 1:52:37 PM By: [name] MD FACS Entered By: [name] on 10/04/2022 13:52:37

## 2022-10-05 ENCOUNTER — Ambulatory Visit (HOSPITAL_BASED_OUTPATIENT_CLINIC_OR_DEPARTMENT_OTHER): Payer: Medicare Other | Admitting: General Surgery

## 2022-10-06 NOTE — Progress Notes (Signed)
SHIRRELL, SOLINGER ([id number]) [id number] Page 1 of 7 Visit Report for 10/04/2022 Arrival Information Details Patient Name: Date of Service: Evelyn Campbell [facility] 10/04/2022 12:45 PM Medical Record Number: [id number] Patient Account Number: [id number] Date of Birth/Sex: Treating RN: [birth year] (66 y.o. [name] Primary Care Hairo Garraway: [name] Other Clinician: Referring Rosella Crandell: Treating Jahnay Lantier/Extender: [name] [name]: 1 Visit Information History Since Last Visit Added or deleted any medications: No Patient Arrived: Ambulatory Any new allergies or adverse reactions: No Arrival Time: 13:09 Had a fall or experienced change in No Accompanied By: self activities of daily living that may affect Transfer Assistance: None risk of falls: Patient Identification Verified: Yes Signs or symptoms of abuse/neglect since last visito No Patient Has Alerts: Yes Hospitalized since last visit: No Patient Alerts: R: 1.19 Implantable device outside of the clinic excluding No cellular tissue based products placed in the center since last visit: Has Dressing in Place as Prescribed: Yes Pain Present Now: No Electronic Signature(s) Signed: 10/05/2022 4:45:09 PM By: Dellie Catholic RN Entered By: Dellie Catholic on 10/04/2022 13:24:03 -------------------------------------------------------------------------------- Encounter Discharge Information Details Patient Name: Date of Service: [name], [name] 10/04/2022 12:45 PM Medical Record Number: [id number] Patient Account Number: [id number] Date of Birth/Sex: Treating RN: 13-Jan-1956 (66 y.o. [name] Primary Care Liliann File: [name] Other Clinician: Referring Blayre Papania: Treating Karis Rilling/Extender: [name] [name]: 1 Encounter Discharge Information Items Discharge Condition: Stable Ambulatory Status: Cane Discharge  Destination: Home Transportation: Private Auto Accompanied By: self Schedule Follow-up Appointment: Yes Clinical Summary of Care: Patient Declined Electronic Signature(s) Signed: 10/04/2022 4:56:57 PM By: Adline Peals Entered By: Adline Peals on 10/04/2022 13:41:56 [name], [name] ([id number]) [id number] Page 2 of 7 -------------------------------------------------------------------------------- Lower Extremity Assessment Details Patient Name: Date of Service: Evelyn Campbell [facility] 10/04/2022 12:45 PM Medical Record Number: [id number] Patient Account Number: [id number] Date of Birth/Sex: Treating RN: 05-02-1956 (66 y.o. [name] Primary Care Shlome Baldree: [name] Other Clinician: Referring Modesto Ganoe: Treating Kesler Wickham/Extender: [name] [name]: 1 Edema Assessment Assessed: [Left: No] [Right: No] [Left: Edema] [Right: :] Calf Left: Right: Point of Measurement: From Medial Instep 44 cm Ankle Left: Right: Point of Measurement: From Medial Instep 21.5 cm Vascular Assessment Pulses: Dorsalis Pedis Palpable: [Right:Yes] Electronic Signature(s) Signed: 10/05/2022 4:45:09 PM By: Dellie Catholic RN Entered By: Dellie Catholic on 10/04/2022 13:25:59 -------------------------------------------------------------------------------- Multi Wound Chart Details Patient Name: Date of Service: [name], [name] 10/04/2022 12:45 PM Medical Record Number: [id number] Patient Account Number: [id number] Date of Birth/Sex: Treating RN: 1956/02/17 (65 y.o. F) Primary Care Bao Bazen: [name] Other Clinician: Referring Lyndzee Kliebert: Treating Autym Siess/Extender: [name] [name]: 1 Vital Signs Height(in): Pulse(bpm): 84 Weight(lbs): Blood Pressure(mmHg): 132/89 Body Mass Index(BMI): Temperature(F): 97.5 Respiratory Rate(breaths/min): 18 [3:Photos:] [N/A:N/A] Right Foot  N/A N/A Wound Location: Gradually Appeared N/A N/A Wounding Event: T be determined o N/A N/A Primary Etiology: Lymphedema, Hypertension, N/A N/A Comorbid History: Peripheral Venous Disease, Gout, Rheumatoid Arthritis, Neuropathy 09/21/2022 N/A N/A Date Acquired: 1 N/A N/A Weeks of Treatment: Open N/A N/A Wound Status: No N/A N/A Wound Recurrence: 0.7x1.5x0.1 N/A N/A Measurements L x W x D (cm) 0.825 N/A N/A A (cm) : rea 0.082 N/A N/A Volume (cm) : 78.80% N/A N/A % Reduction in Area: 78.90% N/A N/A % Reduction in Volume: Full Thickness Without Exposed N/A N/A Classification: Support Structures Medium N/A N/A Exudate A mount: Serosanguineous N/A N/A Exudate Type:  red, brown N/A N/A Exudate Color: Distinct, outline attached N/A N/A Wound Margin: Medium (34-66%) N/A N/A Granulation Amount: Red, Pink N/A N/A Granulation Quality: Medium (34-66%) N/A N/A Necrotic Amount: Eschar, Adherent Slough N/A N/A Necrotic Tissue: Fat Layer (Subcutaneous Tissue): Yes N/A N/A Exposed Structures: Fascia: No Tendon: No Muscle: No Joint: No Bone: No None N/A N/A Epithelialization: Callus: Yes N/A N/A Periwound Skin Texture: Dry/Scaly: Yes N/A N/A Periwound Skin Moisture: No Abnormalities Noted N/A N/A Periwound Skin Color: No Abnormality N/A N/A Temperature: T Contact Cast otal N/A N/A Procedures Performed: Treatment Notes Wound #3 (Foot) Wound Laterality: Right Cleanser Soap and Water Discharge Instruction: May shower and wash wound with dial antibacterial soap and water prior to dressing change. Wound Cleanser Discharge Instruction: Cleanse the wound with wound cleanser prior to applying a clean dressing using gauze sponges, not tissue or cotton balls. Peri-Wound Care Zinc Oxide Ointment 30g tube Discharge Instruction: Apply Zinc Oxide to periwound with each dressing change Topical Primary Dressing KerraCel Ag Gelling Fiber Dressing, 2x2 in (silver  alginate) Discharge Instruction: Apply silver alginate to wound bed as instructed Secondary Dressing Woven Gauze Sponges 2x2 in Discharge Instruction: Apply over primary dressing as directed. Zetuvit Plus Silicone Border Dressing 4x4 (in/in) Discharge Instruction: Apply silicone border over primary dressing as directed. Secured With Compression Wrap Compression Stockings Environmental education officer) Signed: 10/04/2022 1:47:41 PM By: [name] MD FACS Entered By: [name] on 10/04/2022 13:47:41 Vida Rigger, [name] ([id number]) [id number] Page 4 of 7 -------------------------------------------------------------------------------- Multi-Disciplinary Care Plan Details Patient Name: Date of Service: Evelyn Campbell [facility] 10/04/2022 12:45 PM Medical Record Number: [id number] Patient Account Number: [id number] Date of Birth/Sex: Treating RN: 1956/07/30 (66 y.o. [name] Primary Care Creta Dorame: [name] Other Clinician: Referring Cristabel Bicknell: Treating Maryna Yeagle/Extender: [name] [name]: 1 Active Inactive Peripheral Neuropathy Nursing Diagnoses: Knowledge deficit related to disease process and management of peripheral neurovascular dysfunction Potential alteration in peripheral tissue perfusion (select prior to confirmation of diagnosis) Goals: Patient/caregiver will verbalize understanding of disease process and disease management Date Initiated: 09/27/2022 Target Resolution Date: 11/05/2022 Goal Status: Active Interventions: Assess signs and symptoms of neuropathy upon admission and as needed Provide education on Management of Neuropathy and Related Ulcers Notes: Wound/Skin Impairment Nursing Diagnoses: Impaired tissue integrity Knowledge deficit related to ulceration/compromised skin integrity Goals: Patient/caregiver will verbalize understanding of skin care regimen Date Initiated:  09/27/2022 Target Resolution Date: 11/05/2022 Goal Status: Active Interventions: Assess ulceration(s) every visit Treatment Activities: Skin care regimen initiated : 09/27/2022 Topical wound management initiated : 09/27/2022 Notes: Electronic Signature(s) Signed: 10/04/2022 4:56:57 PM By: Adline Peals Entered By: Adline Peals on 10/04/2022 13:41:20 -------------------------------------------------------------------------------- Pain Assessment Details Patient Name: Date of Service: [name], [place] 10/04/2022 12:45 PM Medical Record Number: [id number] Patient Account Number: [id number] Date of Birth/Sex: Treating RN: 04/25/1956 (66 y.o. [name] Primary Care Yichen Gilardi: [name] Other Clinician: Referring Sherril Heyward: Treating Maylene Crocker/Extender: [name] [name]: [place], [place] ([id number]) [id number] Page 5 of 7 Active Problems Location of Pain Severity and Description of Pain Patient Has Paino Yes Site Locations Pain Location: Generalized Pain With Dressing Change: Yes Duration of the Pain. Constant / Intermittento Constant Rate the pain. Current Pain Level: 4 Worst Pain Level: 10 Least Pain Level: 3 Tolerable Pain Level: 3 Character of Pain Describe the Pain: Difficult to Pinpoint Pain Management and Medication Current Pain Management: Medication: Yes Cold Application: No Rest: Yes Massage: No Activity: No T.E.N.S.: No Heat  Application: No Leg drop or elevation: No Is the Current Pain Management Adequate: Adequate Electronic Signature(s) Signed: 10/05/2022 4:45:09 PM By: Dellie Catholic RN Entered By: Dellie Catholic on 10/04/2022 13:25:09 -------------------------------------------------------------------------------- Patient/Caregiver Education Details Patient Name: Date of Service: Modena Morrow, RO [facility] 12/4/2023andnbsp12:45 PM Medical Record Number: [id number] Patient  Account Number: [id number] Date of Birth/Gender: Treating RN: [birth year] (66 y.o. [name] Primary Care Physician: [name] Other Clinician: Referring Physician: Treating Physician/Extender: [name] [name]: 1 Education Assessment Education Provided To: Patient Education Topics Provided Wound/Skin Impairment: Methods: Explain/Verbal Responses: Reinforcements needed, State content correctly Electronic Signature(s) Signed: 10/04/2022 4:56:57 PM By: Adline Peals Entered By: Adline Peals on 10/04/2022 [place], Romelia ([id number]) [id number] Page 6 of 7 -------------------------------------------------------------------------------- Wound Assessment Details Patient Name: Date of Service: Evelyn Campbell [facility] 10/04/2022 12:45 PM Medical Record Number: [id number] Patient Account Number: [id number] Date of Birth/Sex: Treating RN: [birth year] (66 y.o. [name] Primary Care Zayden Hahne: [name] Other Clinician: Referring Haliegh Khurana: Treating Ayvah Caroll/Extender: [name] [name]: 1 Wound Status Wound Number: 3 Primary T be determined o Etiology: Wound Location: Right Foot Wound Open Wounding Event: Gradually Appeared Status: Date Acquired: 09/21/2022 Comorbid Lymphedema, Hypertension, Peripheral Venous Disease, Gout, Weeks Of Treatment: 1 History: Rheumatoid Arthritis, Neuropathy Clustered Wound: No Photos Wound Measurements Length: (cm) 0.7 Width: (cm) 1.5 Depth: (cm) 0.1 Area: (cm) 0.825 Volume: (cm) 0.082 % Reduction in Area: 78.8% % Reduction in Volume: 78.9% Epithelialization: None Tunneling: No Undermining: No Wound Description Classification: Full Thickness Without Exposed Suppor Wound Margin: Distinct, outline attached Exudate Amount: Medium Exudate Type: Serosanguineous Exudate Color: red, brown t Structures  Foul Odor After Cleansing: No Slough/Fibrino Yes Wound Bed Granulation Amount: Medium (34-66%) Exposed Structure Granulation Quality: Red, Pink Fascia Exposed: No Necrotic Amount: Medium (34-66%) Fat Layer (Subcutaneous Tissue) Exposed: Yes Necrotic Quality: Eschar, Adherent Slough Tendon Exposed: No Muscle Exposed: No Joint Exposed: No Bone Exposed: No Periwound Skin Texture Texture Color No Abnormalities Noted: No No Abnormalities Noted: Yes Callus: Yes Temperature / Pain Temperature: No Abnormality Moisture No Abnormalities Noted: No Dry / Scaly: Yes Treatment Notes Wound #3 (Foot) Wound Laterality: Right Cleanser Gose, Charlesia ([id number]) [id number] Page 7 of 7 Soap and Water Discharge Instruction: May shower and wash wound with dial antibacterial soap and water prior to dressing change. Wound Cleanser Discharge Instruction: Cleanse the wound with wound cleanser prior to applying a clean dressing using gauze sponges, not tissue or cotton balls. Peri-Wound Care Zinc Oxide Ointment 30g tube Discharge Instruction: Apply Zinc Oxide to periwound with each dressing change Topical Primary Dressing KerraCel Ag Gelling Fiber Dressing, 2x2 in (silver alginate) Discharge Instruction: Apply silver alginate to wound bed as instructed Secondary Dressing Woven Gauze Sponges 2x2 in Discharge Instruction: Apply over primary dressing as directed. Zetuvit Plus Silicone Border Dressing 4x4 (in/in) Discharge Instruction: Apply silicone border over primary dressing as directed. Secured With Compression Wrap Compression Stockings Environmental education officer) Signed: 10/05/2022 4:45:09 PM By: Dellie Catholic RN Entered By: Dellie Catholic on 10/04/2022 13:29:50 -------------------------------------------------------------------------------- Vitals Details Patient Name: Date of Service: [name], RO [facility] 10/04/2022 12:45 PM Medical Record Number:  [id number] Patient Account Number: [id number] Date of Birth/Sex: Treating RN: 06/20/1956 (66 y.o. [name] Primary Care Freedom Peddy: [name] Other Clinician: Referring Krishna Dancel: Treating Foy Mungia/Extender: [name] [name]: 1 Vital Signs Time Taken: 13:24 Temperature (F): 97.5 Pulse (bpm): 65 Respiratory Rate (breaths/min): 18 Blood Pressure (mmHg): 132/89  Reference Range: 80 - 120 mg / dl Electronic Signature(s) Signed: 10/05/2022 4:45:09 PM By: Dellie Catholic RN Entered By: Dellie Catholic on 10/04/2022 13:24:38

## 2022-10-11 ENCOUNTER — Encounter (HOSPITAL_BASED_OUTPATIENT_CLINIC_OR_DEPARTMENT_OTHER): Payer: Medicare Other | Admitting: Internal Medicine

## 2022-10-11 DIAGNOSIS — L97512 Non-pressure chronic ulcer of other part of right foot with fat layer exposed: Secondary | ICD-10-CM | POA: Diagnosis not present

## 2022-10-12 NOTE — Progress Notes (Signed)
LYNELL, GREENHOUSE ([id number]) [id number] Page 1 of 7 Visit Report for 10/11/2022 HPI Details Patient Name: Date of Service: Evelyn Campbell, Evelyn Campbell [facility] 10/11/2022 3:00 PM Medical Record Number: [id number] Patient Account Number: [id number] Date of Birth/Sex: Treating RN: 05-May-1956 (66 y.o. F) Primary Care Provider: [name] Other Clinician: Referring Provider: Treating Provider/Extender: Carola Frost [name]: 2 History of Present Illness HPI Description: ADMISSION 01/21/2022 This is a 66 year old woman presenting with an ulcer on the right fifth metatarsal head. She says that it has been present for about 6 weeks. She is not certain how it developed, but she does have peripheral neuropathy. She also has lymphedema, hypertension, metabolic syndrome/prediabetes, but does not carry an actual diagnosis of diabetes nor is she on any medications for this. 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 ABI in clinic today was 0.8. She has been treated in the past for lymphedema, but has been unable to tolerate compression. She does have lymphedema pumps but does not use these regularly. She had been applying Neosporin and new skin to her wound, but it did not really change much. She was referred here by her primary care provider for further evaluation and management. 01/28/2022: 1 week follow-up. Apparently there was some miscommunication with home health and they did not come out to help her with her dressing changes. The patient and her husband have been doing them themselves. The wound actually looks quite good, with decreased dimensions and a very nice healthy surface. 02/05/2022: The home health nurse came out during the week and the patient was happy with the care. The wound is smaller today with a clean base. 02/12/2022: The wound continues to contract and is quite small today. There is a bit of callus  buildup, but the wound surface itself is very clean. 02/19/2022: The wound is nearly closed. There is a small amount of callus buildup. No slough or drainage. 02/25/2022: The wound is down to just a sliver. She continues to build up callus. No concern for infection. 03/04/2022: The wound is closed. READMISSION 04/12/2022 Last week, the patient noticed a spot of blood on her stocking where her wound had been. She noted that the wound was open. She began applying the Prisma silver collagen that we had used during her last admission. She wrapped it up and on Friday when she changed the dressing, she noticed that there was a new wound on the dorsal part of her right foot overlying the fifth metatarsal. Today it is quite red and tender. There is slough on the surface. The wound on the plantar surface has periwound callus accumulation but otherwise appears fairly clean. 04/19/2022: The culture that we took last week somehow has been lost and we have no results. The patient has been applying topical gentamicin and silver alginate to her wound, however and both of them looks smaller. The redness and tenderness has improved significantly. Her foot remains macerated in appearance, however. She had some bleeding from the plantar wound. On inspection, it looks like secondary to moisture, the surrounding skin and callus have lifted. When I probed with a thin cotton applicator, this created some minor bleeding and I think this is likely what happened. The tissue is just quite friable. 04/22/2022: The culture data were finally found and she did grow Proteus mirabilis from her wound. I prescribed Bactrim for her. She is here today for her first cast change. Both wound sites look cleaner and smaller today even after  just a few days of casting. 04/26/2022: Both wounds are smaller and very clean. She is completing a course of Bactrim for Proteus mirabilis grown from her wound. 04/29/2022: The patient contacted our office  yesterday concerned that the cast was pushing too hard against her great toe. She is here today to have the cast removed and a new one reapplied. The wound on the dorsal lateral part of her foot is closed. The wound on the plantar surface of the fifth metatarsal head is smaller and epithelializing. 05/06/2022: Her wounds are closed. READMISSION 09/27/2022 She returns with reopening of her wound on the right fifth metatarsal area after she healed in July, she was seen by Dr. Milinda Pointer at [facility]. She was apparently wearing accommodative orthotics that were in good condition and he recommended that she continue to wear those. He also identified that her shoes seemed narrow and that she should consider a wider shoe. We had recommended that she wear a callus pad or a foam donut of sorts to protect the area but she has not been doing this. She is wearing wide shoes but despite this, over the past week, she says that she opened up the wound and the exact same location as before. On inspection, it appears that she probably had developed a large callus that ultimately broke down deep to the surface. The tissue is macerated and the fat layer is exposed. There is no purulent drainage or malodor. No visible signs of infection. 10/04/2022: Even after just a week, there has been a substantial improvement in her wound. There has been epithelialization such that it has split into 2 separate openings. There is some slough accumulation on the surface but the periwound is in better condition. 12/11; considerable improvement in the plantar foot wound. We replaced the total contact cast. The patient tells me she went to see podiatry for custom orthotics but she did not use them but now thinks she will spend the money to get them. I am not exactly sure where her podiatrist [Dr. Hyatt] practices but will she has an appointment in January. ARLEY, GARANT ([id number]) [id number] Page 2  of 7 Electronic Signature(s) Signed: 10/11/2022 5:01:06 PM By: Linton Ham MD Entered By: Linton Ham on 10/11/2022 16:27:57 -------------------------------------------------------------------------------- Chemical Cauterization Details Patient Name: Date of Service: Evelyn Campbell, [place] 10/11/2022 3:00 PM Medical Record Number: [id number] Patient Account Number: [id number] Date of Birth/Sex: Treating RN: 10/04/1956 (66 y.o. F) Primary Care Provider: [name] Other Clinician: Referring Provider: Treating Provider/Extender: Carola Frost [name]: 2 Procedure Performed for: Wound #3 Right Foot Performed By: Physician Ricard Dillon., MD Post Procedure Diagnosis Same as Pre-procedure Notes scribed for Dr. Dellia Nims by Adline Peals, RN Electronic Signature(s) Signed: 10/11/2022 5:01:06 PM By: Linton Ham MD Entered By: Linton Ham on 10/11/2022 16:26:49 -------------------------------------------------------------------------------- Physical Exam Details Patient Name: Date of Service: Evelyn Campbell, RO [facility] 10/11/2022 3:00 PM Medical Record Number: [id number] Patient Account Number: [id number] Date of Birth/Sex: Treating RN: [birth year] (66 y.o. F) Primary Care Provider: [name] Other Clinician: Referring Provider: Treating Provider/Extender: Carola Frost [name]: 2 Constitutional Patient is hypertensive.. Pulse regular and within target range for patient.Marland Kitchen Respirations regular, non-labored and within target range.. Temperature is normal and within the target range for the patient.Marland Kitchen Appears in no distress. Notes Wound exam; the areas on the plantar foot. Very healthy looking. Slightly hyper granulated which I knocked down with a single silver nitrate otherwise  there is a nice rim of epithelialization around this. Electronic Signature(s) Signed: 10/11/2022 5:01:06 PM By: Linton Ham  MD Entered By: Linton Ham on 10/11/2022 16:28:52 Physician Orders Details -------------------------------------------------------------------------------- [name] ([id number]) [id number] Page 3 of 7 Patient Name: Date of Service: [name] [facility] 10/11/2022 3:00 PM Medical Record Number: [id number] Patient Account Number: [id number] Date of Birth/Sex: Treating RN: 1955-11-27 (66 y.o. [name] Primary Care Provider: [name] Other Clinician: Referring Provider: Treating Provider/Extender: Carola Frost [name]: 2 Verbal / Phone Orders: No Diagnosis Coding Follow-up Appointments ppointment in 1 week. - Dr. Celine Ahr - room 2 Return A Anesthetic (In clinic) Topical Lidocaine 4% applied to wound bed Bathing/ Shower/ Hygiene May shower with protection but do not get wound dressing(s) wet. - Please use cast protector (Can purchase cast protector from local pharmacy, such as [facility] or [facility]) Edema Control - Lymphedema / SCD / Other Elevate legs to the level of the heart or above for 30 minutes daily and/or when sitting, a frequency of: Avoid standing for long periods of time. Patient to wear own compression stockings every day. - L left Exercise regularly Off-Loading Total Contact Cast to Right Lower Extremity Removable cast walker boot to: - right foot Wound Treatment Wound #3 - Foot Wound Laterality: Right Cleanser: Soap and Water 1 x Per Week/30 Days Discharge Instructions: May shower and wash wound with dial antibacterial soap and water prior to dressing change. Cleanser: Wound Cleanser 1 x Per Week/30 Days Discharge Instructions: Cleanse the wound with wound cleanser prior to applying a clean dressing using gauze sponges, not tissue or cotton balls. Peri-Wound Care: Zinc Oxide Ointment 30g tube 1 x Per Week/30 Days Discharge Instructions: Apply Zinc Oxide to periwound with each dressing  change Prim Dressing: KerraCel Ag Gelling Fiber Dressing, 2x2 in (silver alginate) 1 x Per Week/30 Days ary Discharge Instructions: Apply silver alginate to wound bed as instructed Secondary Dressing: Woven Gauze Sponges 2x2 in 1 x Per Week/30 Days Discharge Instructions: Apply over primary dressing as directed. Secondary Dressing: Zetuvit Plus Silicone Border Dressing 4x4 (in/in) 1 x Per Week/30 Days Discharge Instructions: Apply silicone border over primary dressing as directed. Patient Medications llergies: celecoxib, ibuprofen, meloxicam, Lipitor, amoxicillin, penicillin, Statins-HMG-CoA Reductase Inhibitors A Notifications Medication Indication Start End 10/11/2022 lidocaine DOSE topical 4 % cream - cream topical Electronic Signature(s) Signed: 10/11/2022 4:59:49 PM By: Adline Peals Signed: 10/11/2022 5:01:06 PM By: Linton Ham MD Entered By: Adline Peals on 10/11/2022 15:50:38 -------------------------------------------------------------------------------- Problem List Details Patient Name: Date of Service: Evelyn Campbell, RO [facility] 10/11/2022 3:00 PM [name] ([id number]) [id number] Page 4 of 7 Medical Record Number: [id number] Patient Account Number: [id number] Date of Birth/Sex: Treating RN: Jul 17, 1956 (66 y.o. F) Primary Care Provider: [name] Other Clinician: Referring Provider: Treating Provider/Extender: Carola Frost [name]: 2 Active Problems ICD-10 Encounter Code Description Active Date MDM Diagnosis [phone] Non-pressure chronic ulcer of other part of right foot with fat layer exposed 09/27/2022 No Yes I87.2 Venous insufficiency (chronic) (peripheral) 09/27/2022 No Yes G62.9 Polyneuropathy, unspecified 09/27/2022 No Yes E66.01 Morbid (severe) obesity due to excess calories 09/27/2022 No Yes Inactive Problems Resolved Problems Electronic Signature(s) Signed: 10/11/2022 5:01:06  PM By: Linton Ham MD Entered By: Linton Ham on 10/11/2022 91:79:15 -------------------------------------------------------------------------------- Progress Note Details Patient Name: Date of Service: Evelyn Campbell, [place] 10/11/2022 3:00 PM Medical Record Number: [id number] Patient Account Number: [id number] Date of Birth/Sex: Treating RN: 1956-03-09 (66 y.o. F) Primary Care  Provider: [name] Other Clinician: Referring Provider: Treating Provider/Extender: Carola Frost [name]: 2 Subjective History of Present Illness (HPI) ADMISSION 01/21/2022 This is a 66 year old woman presenting with an ulcer on the right fifth metatarsal head. She says that it has been present for about 6 weeks. She is not certain how it developed, but she does have peripheral neuropathy. She also has lymphedema, hypertension, metabolic syndrome/prediabetes, but does not carry an actual diagnosis of diabetes nor is she on any medications for this. 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 ABI in clinic today was 0.8. She has been treated in the past for lymphedema, but has been unable to tolerate compression. She does have lymphedema pumps but does not use these regularly. She had been applying Neosporin and new skin to her wound, but it did not really change much. She was referred here by her primary care provider for further evaluation and management. 01/28/2022: 1 week follow-up. Apparently there was some miscommunication with home health and they did not come out to help her with her dressing changes. The patient and her husband have been doing them themselves. The wound actually looks quite good, with decreased dimensions and a very nice healthy surface. 02/05/2022: The home health nurse came out during the week and the patient was happy with the care. The wound is smaller today with a clean base. 02/12/2022: The wound continues to  contract and is quite small today. There is a bit of callus buildup, but the wound surface itself is very clean. 02/19/2022: The wound is nearly closed. There is a small amount of callus buildup. No slough or drainage. 02/25/2022: The wound is down to just a sliver. She continues to build up callus. No concern for infection. ROSALVA, NEARY ([id number]) [id number] Page 5 of 7 03/04/2022: The wound is closed. READMISSION 04/12/2022 Last week, the patient noticed a spot of blood on her stocking where her wound had been. She noted that the wound was open. She began applying the Prisma silver collagen that we had used during her last admission. She wrapped it up and on Friday when she changed the dressing, she noticed that there was a new wound on the dorsal part of her right foot overlying the fifth metatarsal. Today it is quite red and tender. There is slough on the surface. The wound on the plantar surface has periwound callus accumulation but otherwise appears fairly clean. 04/19/2022: The culture that we took last week somehow has been lost and we have no results. The patient has been applying topical gentamicin and silver alginate to her wound, however and both of them looks smaller. The redness and tenderness has improved significantly. Her foot remains macerated in appearance, however. She had some bleeding from the plantar wound. On inspection, it looks like secondary to moisture, the surrounding skin and callus have lifted. When I probed with a thin cotton applicator, this created some minor bleeding and I think this is likely what happened. The tissue is just quite friable. 04/22/2022: The culture data were finally found and she did grow Proteus mirabilis from her wound. I prescribed Bactrim for her. She is here today for her first cast change. Both wound sites look cleaner and smaller today even after just a few days of casting. 04/26/2022: Both wounds are smaller and  very clean. She is completing a course of Bactrim for Proteus mirabilis grown from her wound. 04/29/2022: The patient contacted our office yesterday concerned  that the cast was pushing too hard against her great toe. She is here today to have the cast removed and a new one reapplied. The wound on the dorsal lateral part of her foot is closed. The wound on the plantar surface of the fifth metatarsal head is smaller and epithelializing. 05/06/2022: Her wounds are closed. READMISSION 09/27/2022 She returns with reopening of her wound on the right fifth metatarsal area after she healed in July, she was seen by Dr. Milinda Pointer at [facility]. She was apparently wearing accommodative orthotics that were in good condition and he recommended that she continue to wear those. He also identified that her shoes seemed narrow and that she should consider a wider shoe. We had recommended that she wear a callus pad or a foam donut of sorts to protect the area but she has not been doing this. She is wearing wide shoes but despite this, over the past week, she says that she opened up the wound and the exact same location as before. On inspection, it appears that she probably had developed a large callus that ultimately broke down deep to the surface. The tissue is macerated and the fat layer is exposed. There is no purulent drainage or malodor. No visible signs of infection. 10/04/2022: Even after just a week, there has been a substantial improvement in her wound. There has been epithelialization such that it has split into 2 separate openings. There is some slough accumulation on the surface but the periwound is in better condition. 12/11; considerable improvement in the plantar foot wound. We replaced the total contact cast. The patient tells me she went to see podiatry for custom orthotics but she did not use them but now thinks she will spend the money to get them. I am not exactly sure where her podiatrist [Dr.  Hyatt] practices but will she has an appointment in January. Objective Constitutional Patient is hypertensive.. Pulse regular and within target range for patient.Marland Kitchen Respirations regular, non-labored and within target range.. Temperature is normal and within the target range for the patient.Marland Kitchen Appears in no distress. Vitals Time Taken: 3:10 AM, Temperature: 97.8 F, Pulse: 58 bpm, Respiratory Rate: 20 breaths/min, Blood Pressure: 142/97 mmHg. General Notes: Wound exam; the areas on the plantar foot. Very healthy looking. Slightly hyper granulated which I knocked down with a single silver nitrate otherwise there is a nice rim of epithelialization around this. Integumentary (Hair, Skin) Wound #3 status is Open. Original cause of wound was Gradually Appeared. The date acquired was: 09/21/2022. The wound has been in treatment 2 weeks. The wound is located on the Right Foot. The wound measures 0.5cm length x 0.6cm width x 0.1cm depth; 0.236cm^2 area and 0.024cm^3 volume. There is Fat Layer (Subcutaneous Tissue) exposed. There is no tunneling or undermining noted. There is a medium amount of serosanguineous drainage noted. The wound margin is distinct with the outline attached to the wound base. There is large (67-100%) red, pink granulation within the wound bed. There is a small (1-33%) amount of necrotic tissue within the wound bed including Eschar and Adherent Slough. The periwound skin appearance had no abnormalities noted for color. The periwound skin appearance exhibited: Callus, Dry/Scaly. Periwound temperature was noted as No Abnormality. Assessment Active Problems ICD-10 Non-pressure chronic ulcer of other part of right foot with fat layer exposed Venous insufficiency (chronic) (peripheral) Polyneuropathy, unspecified Morbid (severe) obesity due to excess calories Odaniel, Twania ([id number]) [id number] Page 6 of 7 Procedures Wound #3 Pre-procedure diagnosis of  Wound #3 is a Trauma, Other located on the Right Foot . There was a T Contact Cast Procedure by Ricard Dillon., MD. otal Post procedure Diagnosis Wound #3: Same as Pre-Procedure Notes: scribed for Dr. Dellia Nims by Adline Peals, RN. Pre-procedure diagnosis of Wound #3 is a Trauma, Other located on the Right Foot . An Chemical Cauterization procedure was performed by Ricard Dillon., MD. Post procedure Diagnosis Wound #3: Same as Pre-Procedure Notes: scribed for Dr. Dellia Nims by Adline Peals, RN Plan Follow-up Appointments: Return Appointment in 1 week. - Dr. Celine Ahr - room 2 Anesthetic: (In clinic) Topical Lidocaine 4% applied to wound bed Bathing/ Shower/ Hygiene: May shower with protection but do not get wound dressing(s) wet. - Please use cast protector (Can purchase cast protector from local pharmacy, such as [facility] or [facility]) Edema Control - Lymphedema / SCD / Other: Elevate legs to the level of the heart or above for 30 minutes daily and/or when sitting, a frequency of: Avoid standing for long periods of time. Patient to wear own compression stockings every day. - L left Exercise regularly Off-Loading: T Contact Cast to Right Lower Extremity otal Removable cast walker boot to: - right foot The following medication(s) was prescribed: lidocaine topical 4 % cream cream topical was prescribed at facility WOUND #3: - Foot Wound Laterality: Right Cleanser: Soap and Water 1 x Per Week/30 Days Discharge Instructions: May shower and wash wound with dial antibacterial soap and water prior to dressing change. Cleanser: Wound Cleanser 1 x Per Week/30 Days Discharge Instructions: Cleanse the wound with wound cleanser prior to applying a clean dressing using gauze sponges, not tissue or cotton balls. Peri-Wound Care: Zinc Oxide Ointment 30g tube 1 x Per Week/30 Days Discharge Instructions: Apply Zinc Oxide to periwound with each dressing change Prim Dressing: KerraCel Ag Gelling  Fiber Dressing, 2x2 in (silver alginate) 1 x Per Week/30 Days ary Discharge Instructions: Apply silver alginate to wound bed as instructed Secondary Dressing: Woven Gauze Sponges 2x2 in 1 x Per Week/30 Days Discharge Instructions: Apply over primary dressing as directed. Secondary Dressing: Zetuvit Plus Silicone Border Dressing 4x4 (in/in) 1 x Per Week/30 Days Discharge Instructions: Apply silicone border over primary dressing as directed. 1. We are continuing with silver alginate, border foam under a total contact cast 2. This may be healed before she sees her podiatrist again. 3. I have advised her to get custom insoles for her shoes she will need to pad this area in the eventuality this heals before she gets into podiatry. 4. We replaced the total contact cast in the standard fashion Electronic Signature(s) Signed: 10/11/2022 5:01:06 PM By: Linton Ham MD Entered By: Linton Ham on 10/11/2022 16:30:18 -------------------------------------------------------------------------------- Total Contact Cast Details Patient Name: Date of Service: Evelyn Campbell, RO [facility] 10/11/2022 3:00 PM Medical Record Number: [id number] Patient Account Number: [id number] Date of Birth/Sex: Treating RN: Aug 31, 1956 (66 y.o. F) Primary Care Provider: [name] Other Clinician: Referring Provider: Treating Provider/Extender: Carola Frost [name]: 2 T Contact Cast Applied for Wound Assessment: otal Wound #3 Right Foot Performed By: Physician Ricard Dillon., MD Post Procedure Diagnosis [name] ([id number]) [id number] Page 7 of 7 Same as Pre-procedure Notes scribed for Dr. Dellia Nims by Adline Peals, RN Electronic Signature(s) Signed: 10/11/2022 5:01:06 PM By: Linton Ham MD Entered By: Linton Ham on 10/11/2022 16:31:06 -------------------------------------------------------------------------------- SuperBill  Details Patient Name: Date of Service: Evelyn Campbell, [place] 10/11/2022 Medical Record Number: [id number] Patient Account Number: [id number] Date of Birth/Sex:  Treating RN: 1956/01/01 (66 y.o. F) Primary Care Provider: [name] Other Clinician: Referring Provider: Treating Provider/Extender: Carola Frost [name]: 2 Diagnosis Coding ICD-10 Codes Code Description [phone] Non-pressure chronic ulcer of other part of right foot with fat layer exposed I87.2 Venous insufficiency (chronic) (peripheral) G62.9 Polyneuropathy, unspecified E66.01 Morbid (severe) obesity due to excess calories Facility Procedures : CPT4 Code: [id number] Description: 29445 - APPLY TOTAL CONTACT LEG CAST ICD-10 Diagnosis Description L97.512 Non-pressure chronic ulcer of other part of right foot with fat layer exposed Modifier: Quantity: 1 Physician Procedures : CPT4 Code Description Modifier [id number] - WC PHYS APPLY TOTAL CONTACT CAST ICD-10 Diagnosis Description L97.512 Non-pressure chronic ulcer of other part of right foot with fat layer exposed Quantity: 1 Electronic Signature(s) Signed: 10/11/2022 5:01:06 PM By: Linton Ham MD Entered By: Linton Ham on 10/11/2022 16:30:49

## 2022-10-13 NOTE — Progress Notes (Signed)
OLINA, MELFI ([id number]) [id number] Page 1 of 7 Visit Report for 10/11/2022 Arrival Information Details Patient Name: Date of Service: Latanya Maudlin ND, Carolyn Stare [facility] 10/11/2022 3:00 PM Medical Record Number: [id number] Patient Account Number: [id number] Date of Birth/Sex: Treating RN: 05-25-1956 (66 y.o. F) Primary Care Uthman Mroczkowski: [name] Other Clinician: Referring Joelee Snoke: Treating Kahli Mayon/Extender: Carola Frost [name]: 2 Visit Information History Since Last Visit All ordered tests and consults were completed: No Patient Arrived: [name] Added or deleted any medications: No Arrival Time: 15:12 Any new allergies or adverse reactions: No Accompanied By: self Had a fall or experienced change in No Transfer Assistance: None activities of daily living that may affect Patient Identification Verified: Yes risk of falls: Secondary Verification Process Completed: Yes Signs or symptoms of abuse/neglect since last visito No Patient Has Alerts: Yes Hospitalized since last visit: No Patient Alerts: R: 1.19 Implantable device outside of the clinic excluding No cellular tissue based products placed in the center since last visit: Pain Present Now: No Electronic Signature(s) Signed: 10/11/2022 3:52:57 PM By: Worthy Rancher Entered By: Worthy Rancher on 10/11/2022 15:12:36 -------------------------------------------------------------------------------- Encounter Discharge Information Details Patient Name: Date of Service: [name], RO [facility] 10/11/2022 3:00 PM Medical Record Number: [id number] Patient Account Number: [id number] Date of Birth/Sex: Treating RN: [birth year] (66 y.o. [name] Primary Care Mattalyn Anderegg: [name] Other Clinician: Referring Edna Rede: Treating Melton Walls/Extender: Carola Frost [name]: 2 Encounter Discharge Information Items Discharge Condition: Stable Ambulatory  Status: Ambulatory Discharge Destination: Home Transportation: Private Auto Accompanied By: self Schedule Follow-up Appointment: Yes Clinical Summary of Care: Patient Declined Electronic Signature(s) Signed: 10/11/2022 4:59:49 PM By: Adline Peals Entered By: Adline Peals on 10/11/2022 15:51:25 [name] ([id number]) [id number] Page 2 of 7 -------------------------------------------------------------------------------- Lower Extremity Assessment Details Patient Name: Date of Service: [name] [facility] 10/11/2022 3:00 PM Medical Record Number: [id number] Patient Account Number: [id number] Date of Birth/Sex: Treating RN: Sep 24, 1956 (67 y.o. [name] Primary Care Alden Bensinger: [name] Other Clinician: Referring Micaiah Litle: Treating Jenasia Dolinar/Extender: Carola Frost [name]: 2 Edema Assessment Assessed: Shirlyn Goltz: No] Patrice Paradise: No] [Left: Edema] [Right: :] Calf Left: Right: Point of Measurement: From Medial Instep 45.8 cm Ankle Left: Right: Point of Measurement: From Medial Instep 22.2 cm Vascular Assessment Pulses: Dorsalis Pedis Palpable: [Right:Yes] Electronic Signature(s) Signed: 10/13/2022 2:56:09 PM By: Dellie Catholic RN Entered By: Dellie Catholic on 10/11/2022 15:35:28 -------------------------------------------------------------------------------- Multi Wound Chart Details Patient Name: Date of Service: [name], RO [facility] 10/11/2022 3:00 PM Medical Record Number: [id number] Patient Account Number: [id number] Date of Birth/Sex: Treating RN: Mar 19, 1956 (66 y.o. F) Primary Care Sage Hammill: [name] Other Clinician: Referring Tayshun Gappa: Treating Dynasia Kercheval/Extender: Carola Frost [name]: 2 Vital Signs Height(in): Pulse(bpm): 28 Weight(lbs): Blood Pressure(mmHg): 142/97 Body Mass Index(BMI): Temperature(F): 97.8 Respiratory Rate(breaths/min):  20 [3:Photos:] [N/A:N/A] Right Foot N/A N/A Wound Location: Gradually Appeared N/A N/A Wounding Event: Trauma, Other N/A N/A Primary Etiology: Lymphedema, Hypertension, N/A N/A Comorbid History: Peripheral Venous Disease, Gout, Rheumatoid Arthritis, Neuropathy 09/21/2022 N/A N/A Date Acquired: 2 N/A N/A Weeks of Treatment: Open N/A N/A Wound Status: No N/A N/A Wound Recurrence: 0.5x0.6x0.1 N/A N/A Measurements L x W x D (cm) 0.236 N/A N/A A (cm) : rea 0.024 N/A N/A Volume (cm) : 93.90% N/A N/A % Reduction in Area: 93.80% N/A N/A % Reduction in Volume: Full Thickness Without Exposed N/A N/A Classification: Support Structures Medium N/A N/A Exudate A mount: Serosanguineous N/A N/A Exudate  Type: red, brown N/A N/A Exudate Color: Distinct, outline attached N/A N/A Wound Margin: Large (67-100%) N/A N/A Granulation Amount: Red, Pink N/A N/A Granulation Quality: Small (1-33%) N/A N/A Necrotic Amount: Eschar, Adherent Slough N/A N/A Necrotic Tissue: Fat Layer (Subcutaneous Tissue): Yes N/A N/A Exposed Structures: Fascia: No Tendon: No Muscle: No Joint: No Bone: No None N/A N/A Epithelialization: Callus: Yes N/A N/A Periwound Skin Texture: Dry/Scaly: Yes N/A N/A Periwound Skin Moisture: No Abnormalities Noted N/A N/A Periwound Skin Color: No Abnormality N/A N/A Temperature: Chemical Cauterization N/A N/A Procedures Performed: T Contact Cast otal Treatment Notes Wound #3 (Foot) Wound Laterality: Right Cleanser Soap and Water Discharge Instruction: May shower and wash wound with dial antibacterial soap and water prior to dressing change. Wound Cleanser Discharge Instruction: Cleanse the wound with wound cleanser prior to applying a clean dressing using gauze sponges, not tissue or cotton balls. Peri-Wound Care Zinc Oxide Ointment 30g tube Discharge Instruction: Apply Zinc Oxide to periwound with each dressing change Topical Primary  Dressing KerraCel Ag Gelling Fiber Dressing, 2x2 in (silver alginate) Discharge Instruction: Apply silver alginate to wound bed as instructed Secondary Dressing Woven Gauze Sponges 2x2 in Discharge Instruction: Apply over primary dressing as directed. Zetuvit Plus Silicone Border Dressing 4x4 (in/in) Discharge Instruction: Apply silicone border over primary dressing as directed. Secured With Compression Wrap Compression Stockings Environmental education officer) Signed: 10/11/2022 5:01:06 PM By: Linton Ham MD Entered By: Linton Ham on 10/11/2022 16:26:29 [name] ([id number]) [id number] Page 4 of 7 -------------------------------------------------------------------------------- Multi-Disciplinary Care Plan Details Patient Name: Date of Service: [name] [facility] 10/11/2022 3:00 PM Medical Record Number: [id number] Patient Account Number: [id number] Date of Birth/Sex: Treating RN: [birth year] (66 y.o. [name] Primary Care Reyn Faivre: [name] Other Clinician: Referring Barbie Croston: Treating Everardo Voris/Extender: Carola Frost [name]: 2 Active Inactive Peripheral Neuropathy Nursing Diagnoses: Knowledge deficit related to disease process and management of peripheral neurovascular dysfunction Potential alteration in peripheral tissue perfusion (select prior to confirmation of diagnosis) Goals: Patient/caregiver will verbalize understanding of disease process and disease management Date Initiated: 09/27/2022 Target Resolution Date: 11/05/2022 Goal Status: Active Interventions: Assess signs and symptoms of neuropathy upon admission and as needed Provide education on Management of Neuropathy and Related Ulcers Notes: Wound/Skin Impairment Nursing Diagnoses: Impaired tissue integrity Knowledge deficit related to ulceration/compromised skin integrity Goals: Patient/caregiver will verbalize  understanding of skin care regimen Date Initiated: 09/27/2022 Target Resolution Date: 11/05/2022 Goal Status: Active Interventions: Assess ulceration(s) every visit Treatment Activities: Skin care regimen initiated : 09/27/2022 Topical wound management initiated : 09/27/2022 Notes: Electronic Signature(s) Signed: 10/11/2022 4:59:49 PM By: Adline Peals Entered By: Adline Peals on 10/11/2022 15:50:44 -------------------------------------------------------------------------------- Pain Assessment Details Patient Name: Date of Service: [name], RO [facility] 10/11/2022 3:00 PM Medical Record Number: [id number] Patient Account Number: [id number] Date of Birth/Sex: Treating RN: 18-May-1956 (66 y.o. F) Primary Care Jeri Rawlins: [name] Other Clinician: Referring Suzan Manon: Treating Greyden Besecker/Extender: Carola Frost [place], [place] ([id number]) [phone].pdf Page 5 of 7 [name]: 2 Active Problems Location of Pain Severity and Description of Pain Patient Has Paino No Site Locations Pain Management and Medication Current Pain Management: Electronic Signature(s) Signed: 10/11/2022 3:52:57 PM By: Worthy Rancher Entered By: Worthy Rancher on 10/11/2022 15:13:27 -------------------------------------------------------------------------------- Patient/Caregiver Education Details Patient Name: Date of Service: [name], RO [facility] 12/11/2023andnbsp3:00 PM Medical Record Number: [id number] Patient Account Number: [id number] Date of Birth/Gender: Treating RN: 18-Apr-1956 (66 y.o. [name] Primary Care Physician: [name] Other Clinician:  Referring Physician: Treating Physician/Extender: Leonard Downing in Treatment: 2 Education Assessment Education Provided To: Patient Education Topics Provided Wound/Skin Impairment: Methods: Explain/Verbal Responses: Reinforcements needed, State content  correctly Electronic Signature(s) Signed: 10/11/2022 4:59:49 PM By: Adline Peals Entered By: Adline Peals on 10/11/2022 15:50:55 [name] ([id number]) [id number] Page 6 of 7 -------------------------------------------------------------------------------- Wound Assessment Details Patient Name: Date of Service: [name] [facility] 10/11/2022 3:00 PM Medical Record Number: [id number] Patient Account Number: [id number] Date of Birth/Sex: Treating RN: 05/01/1956 (66 y.o. [name] Primary Care Yaffa Seckman: [name] Other Clinician: Referring Katerina Zurn: Treating Facundo Allemand/Extender: Carola Frost [name]: 2 Wound Status Wound Number: 3 Primary Trauma, Other Etiology: Wound Location: Right Foot Wound Open Wounding Event: Gradually Appeared Status: Date Acquired: 09/21/2022 Comorbid Lymphedema, Hypertension, Peripheral Venous Disease, Gout, Weeks Of Treatment: 2 History: Rheumatoid Arthritis, Neuropathy Clustered Wound: No Photos Wound Measurements Length: (cm) 0.5 Width: (cm) 0.6 Depth: (cm) 0.1 Area: (cm) 0.236 Volume: (cm) 0.024 % Reduction in Area: 93.9% % Reduction in Volume: 93.8% Epithelialization: None Tunneling: No Undermining: No Wound Description Classification: Full Thickness Without Exposed Suppor Wound Margin: Distinct, outline attached Exudate Amount: Medium Exudate Type: Serosanguineous Exudate Color: red, brown t Structures Foul Odor After Cleansing: No Slough/Fibrino Yes Wound Bed Granulation Amount: Large (67-100%) Exposed Structure Granulation Quality: Red, Pink Fascia Exposed: No Necrotic Amount: Small (1-33%) Fat Layer (Subcutaneous Tissue) Exposed: Yes Necrotic Quality: Eschar, Adherent Slough Tendon Exposed: No Muscle Exposed: No Joint Exposed: No Bone Exposed: No Periwound Skin Texture Texture Color No Abnormalities Noted: No No Abnormalities Noted:  Yes Callus: Yes Temperature / Pain Temperature: No Abnormality Moisture No Abnormalities Noted: No Dry / Scaly: Yes Treatment Notes Wound #3 (Foot) Wound Laterality: Right Cleanser [place], [place] ([id number]) [id number] Page 7 of 7 Soap and Water Discharge Instruction: May shower and wash wound with dial antibacterial soap and water prior to dressing change. Wound Cleanser Discharge Instruction: Cleanse the wound with wound cleanser prior to applying a clean dressing using gauze sponges, not tissue or cotton balls. Peri-Wound Care Zinc Oxide Ointment 30g tube Discharge Instruction: Apply Zinc Oxide to periwound with each dressing change Topical Primary Dressing KerraCel Ag Gelling Fiber Dressing, 2x2 in (silver alginate) Discharge Instruction: Apply silver alginate to wound bed as instructed Secondary Dressing Woven Gauze Sponges 2x2 in Discharge Instruction: Apply over primary dressing as directed. Zetuvit Plus Silicone Border Dressing 4x4 (in/in) Discharge Instruction: Apply silicone border over primary dressing as directed. Secured With Compression Wrap Compression Stockings Environmental education officer) Signed: 10/13/2022 2:56:09 PM By: Dellie Catholic RN Entered By: Dellie Catholic on 10/11/2022 15:41:49 -------------------------------------------------------------------------------- Vitals Details Patient Name: Date of Service: [name], RO [facility] 10/11/2022 3:00 PM Medical Record Number: [id number] Patient Account Number: [id number] Date of Birth/Sex: Treating RN: [birth year] (66 y.o. F) Primary Care Sylvanna Burggraf: [name] Other Clinician: Referring Jandy Brackens: Treating Dalayla Aldredge/Extender: Carola Frost [name]: 2 Vital Signs Time Taken: 03:10 Temperature (F): 97.8 Pulse (bpm): 58 Respiratory Rate (breaths/min): 20 Blood Pressure (mmHg): 134/84 Reference Range: 80 - 120 mg / dl Electronic  Signature(s) Signed: 10/13/2022 2:56:09 PM By: Dellie Catholic RN Previous Signature: 10/11/2022 3:52:57 PM Version By: Worthy Rancher Entered By: Dellie Catholic on 10/11/2022 16:43:13

## 2022-10-19 IMAGING — MR MR ABDOMEN WO/W CM
12 of 17 series · 27 of 48 positions shown · IV contrast (Multihance)
Comparison: CT scan 03/12/2021 and prior MRI 6340

CLINICAL DATA: Evaluate right renal lesions seen on recent CT scan.

EXAM:
MRI ABDOMEN WITHOUT AND WITH CONTRAST
TECHNIQUE: Multiplanar multisequence MR imaging of the abdomen was performed
both before and after the administration of intravenous contrast.
CONTRAST:  20mL MULTIHANCE GADOBENATE DIMEGLUMINE 529 MG/ML IV SOLN

[Series 3: T2 · axial · 6.5mm · 0.74mm/px · 1 of 30 slices shown (1 of 3)]
[im 1/30]
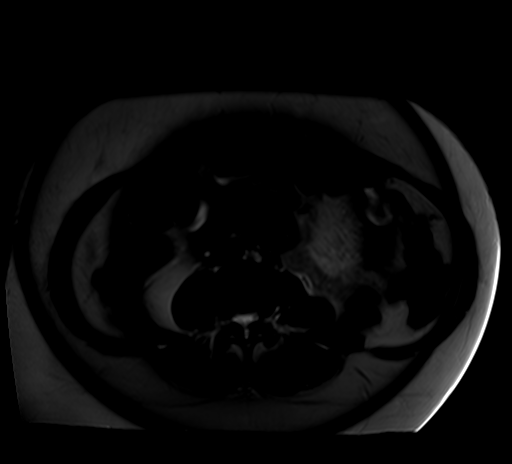

[Series 4: T2 · coronal · 5.0mm · 1.56mm/px · 1 of 36 slices shown (2 of 3)]
[im 1/36]
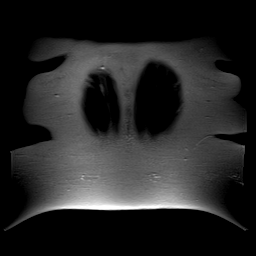

[Series 5: axial tru fisp · axial · 5.0mm · 1.41mm/px · 1 of 39 slices shown]
[im 1/39]
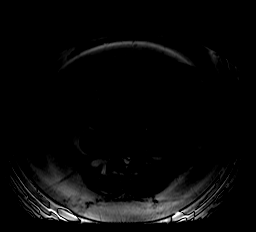

[Series 6: [id number]_p2 · axial · 6.0mm · 1.98mm/px · z∈[-82,+144]mm · 2 of 90 slices shown]
[im 1/90]
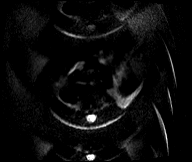
[im 90/90]
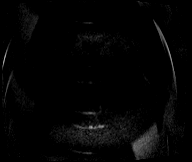

[Series 7: ep2d_[id number]_adc · axial · 6.0mm · 1.98mm/px · 1 of 30 slices shown]
[im 1/30]
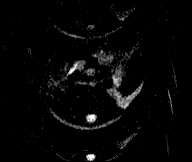

[Series 8: T2 · axial · 5.0mm · 1.48mm/px · 1 of 35 slices shown (3 of 3)]
[im 1/35]
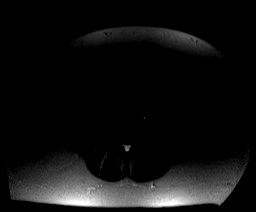

[Series 9: axial in out · axial · 6.0mm · 0.74mm/px · z∈[-102,+119]mm · 2 of 66 slices shown]
[im 1/66]
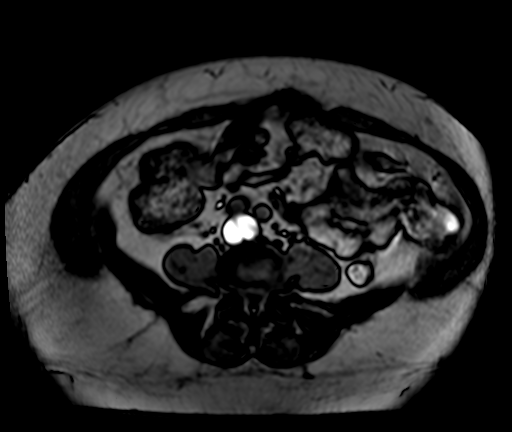
[im 66/66]
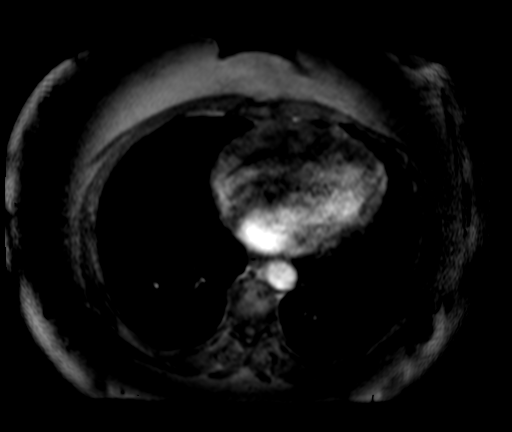

[Series 10: T1 dynamic · axial · non-contrast · 2.2mm · 0.78mm/px · z∈[-96,+113]mm · 4 of 96 slices shown]
[im 1/96]
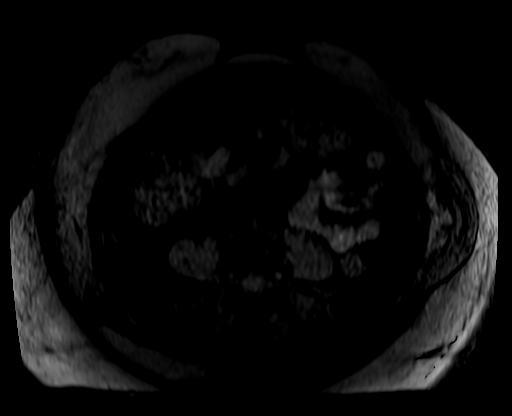
[im 32/96]
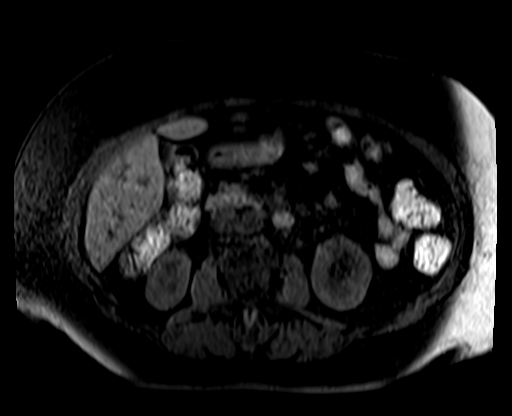
[im 64/96]
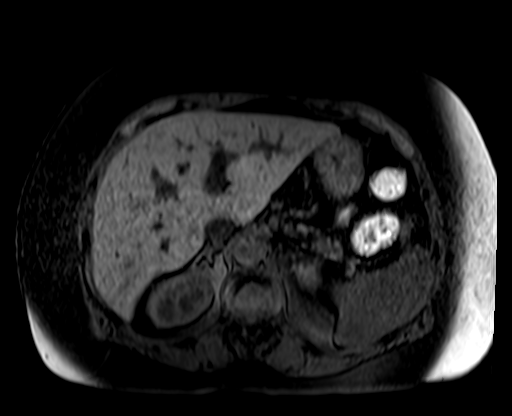
[im 96/96]
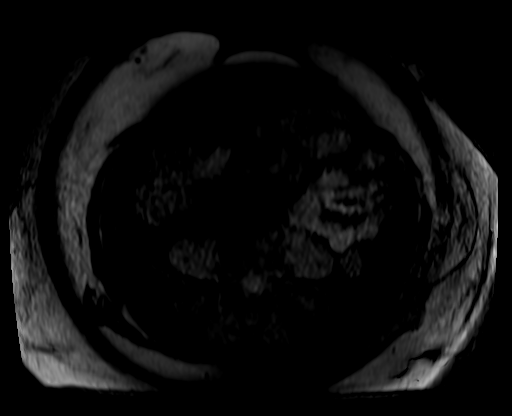

[Series 11: post 25 sec · axial · 2.2mm · 0.78mm/px · z∈[-96,+113]mm · 4 of 96 slices shown]
[im 1/96]
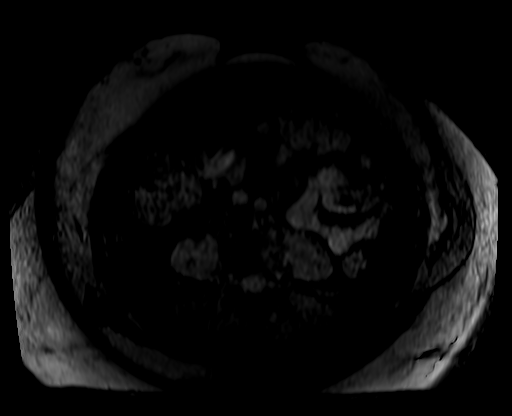
[im 32/96]
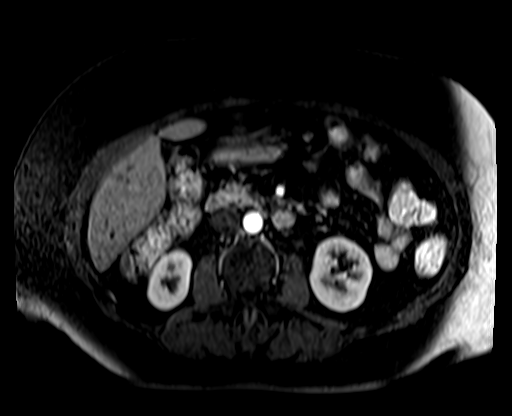
[im 64/96]
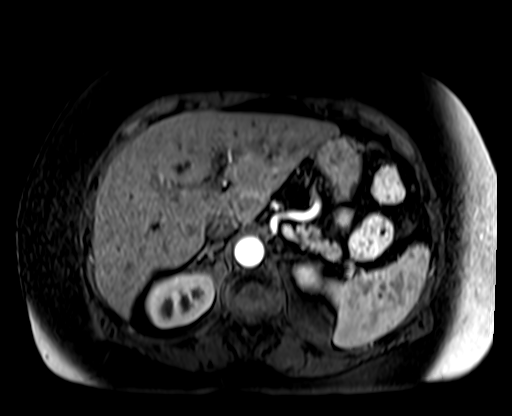
[im 96/96]
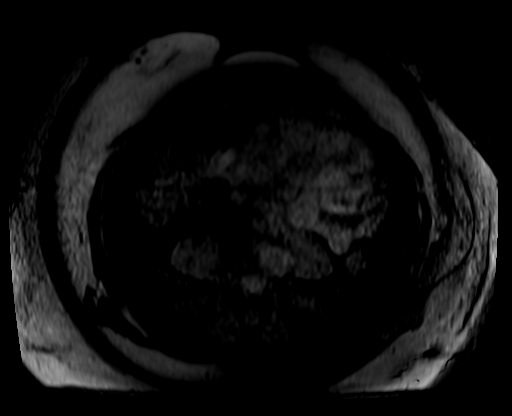

[Series 12: post 25 sec_sub · axial · 2.2mm · 0.78mm/px · z∈[-96,+113]mm · 4 of 96 slices shown]
[im 1/96]
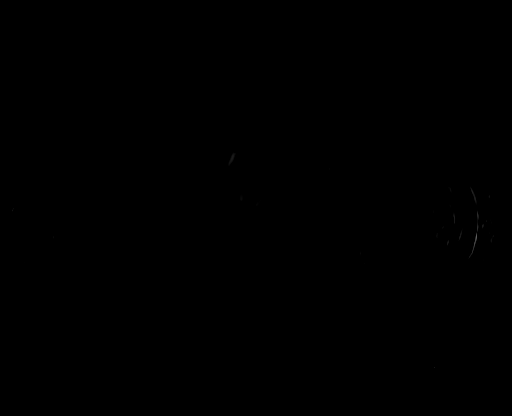
[im 32/96]
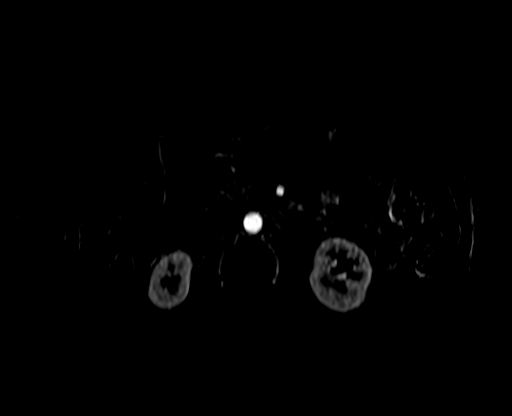
[im 64/96]
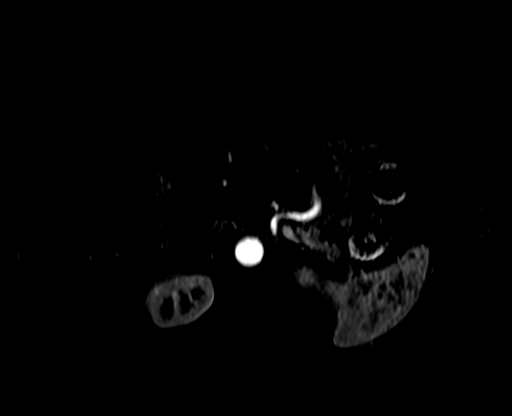
[im 96/96]
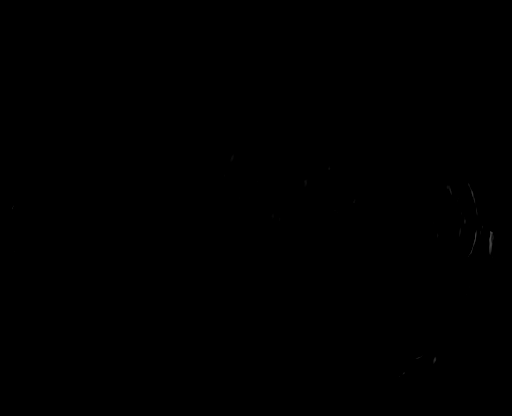

[Series 13: post 45 sec · axial · 2.2mm · 0.78mm/px · z∈[-96,+113]mm · 4 of 96 slices shown]
[im 1/96]
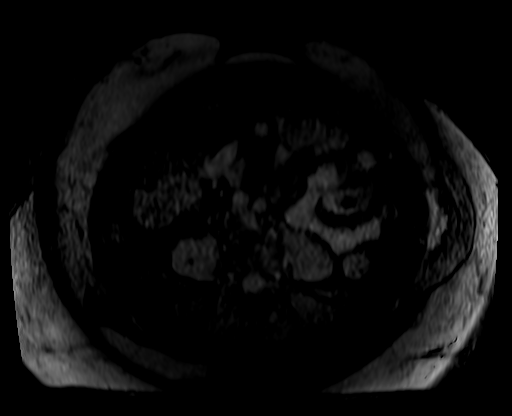
[im 32/96]
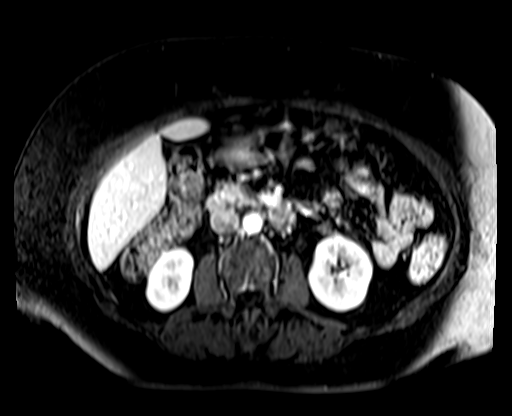
[im 64/96]
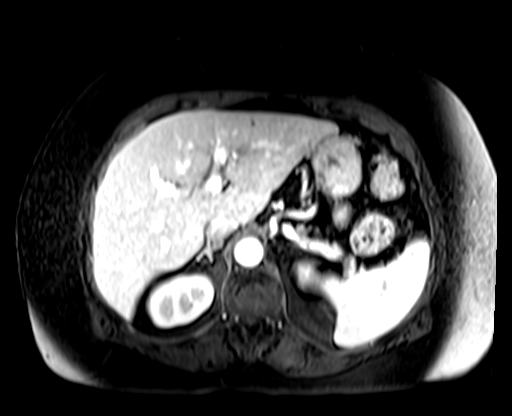
[im 96/96]
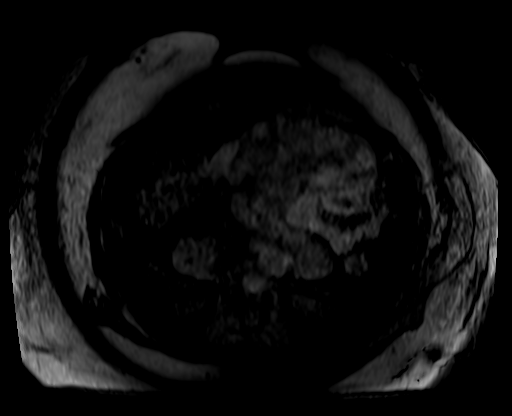

[Series 14: post 45 sec_sub · axial · 2.2mm · 0.78mm/px · z∈[-96,-28]mm · 2 of 96 slices shown]
[im 1/96]
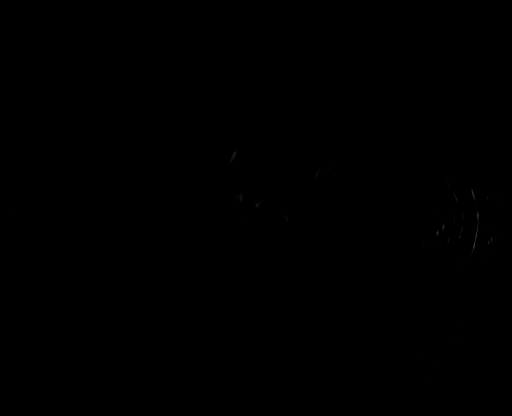
[im 32/96]
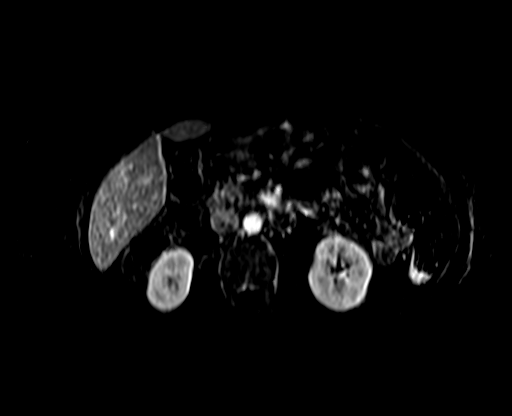

[27 of 48 positions shown; findings below may reference images not displayed]

FINDINGS: Lower chest: The lung bases are clear of an acute process. No
worrisome pulmonary lesions or pleural or pericardial effusion.

Hepatobiliary: No hepatic lesions or intrahepatic biliary
dilatation. There is a very small nonenhancing cyst in segment 8.
The gallbladder is surgically absent. No common bile duct
dilatation.

Pancreas:  No mass, inflammation or ductal dilatation.

Spleen: Normal size. Small scattered cysts or hemangiomas are noted.

Adrenals/Urinary Tract: Status post left adrenalectomy. The right
adrenal gland is unremarkable.

Smoothly marginated well circumscribed 18 mm upper pole right renal
lesion demonstrating fat saturation and consistent with a benign
angiomyolipoma.

12 mm exophytic lesion projecting off the upper pole region of the
right kidney has bright T1 signal intensity and no contrast
enhancement and is consistent with a benign hemorrhagic cyst.

Simple left renal cyst is noted. No worrisome renal lesions or
hydronephrosis.

Stomach/Bowel: The stomach, duodenum, visualized small bowel and
visualized colon are unremarkable.

Vascular/Lymphatic: The aorta and branch vessels are patent. The
major venous structures are patent. No mesenteric or retroperitoneal
mass or adenopathy.

Other:  No ascites or abdominal wall hernia.

Musculoskeletal: No significant bony findings.
IMPRESSION: 1. 18 mm upper pole right renal lesion is consistent with a benign
angiomyolipoma.
2. 12 mm exophytic lesion projecting off the upper pole region of
the right kidney is consistent with a benign hemorrhagic cyst.
3. Simple left renal cyst.
4. Status post left adrenalectomy.
5. No acute abdominal findings, mass lesions or adenopathy.

## 2022-10-20 ENCOUNTER — Encounter (HOSPITAL_BASED_OUTPATIENT_CLINIC_OR_DEPARTMENT_OTHER): Payer: Medicare Other | Admitting: General Surgery

## 2022-10-20 DIAGNOSIS — L97512 Non-pressure chronic ulcer of other part of right foot with fat layer exposed: Secondary | ICD-10-CM | POA: Diagnosis not present

## 2022-10-21 NOTE — Progress Notes (Signed)
WYLLOW, SEIGLER ([id number]) [id number] Page 1 of 7 Visit Report for 10/20/2022 Arrival Information Details Patient Name: Date of Service: Evelyn Campbell [facility] 10/20/2022 3:30 PM Medical Record Number: [id number] Patient Account Number: [id number] Date of Birth/Sex: Treating RN: 1956/04/15 (66 y.o. Evelyn Campbell, [place] Primary Care Evelyn Campbell: Evelyn Campbell Other Clinician: Referring Evelyn Campbell: Treating Evelyn Campbell/Extender: Evelyn Campbell [name]: 3 Visit Information History Since Last Visit Added or deleted any medications: No Patient Arrived: Ambulatory Any new allergies or adverse reactions: No Arrival Time: 15:58 Had a fall or experienced change in No Accompanied By: self activities of daily living that may affect Transfer Assistance: None risk of falls: Patient Has Alerts: Yes Signs or symptoms of abuse/neglect since last visito No Patient Alerts: R: 1.19 Hospitalized since last visit: No Implantable device outside of the clinic excluding No cellular tissue based products placed in the center since last visit: Has Dressing in Place as Prescribed: Yes Pain Present Now: No Electronic Signature(s) Signed: 10/21/2022 4:49:39 PM By: Blanche East RN Entered By: Blanche East on 10/20/2022 15:59:11 -------------------------------------------------------------------------------- Encounter Discharge Information Details Patient Name: Date of Service: [name], [place] 10/20/2022 3:30 PM Medical Record Number: [id number] Patient Account Number: [id number] Date of Birth/Sex: Treating RN: Jun 17, 1956 (66 y.o. Evelyn Campbell Primary Care Imani Fiebelkorn: Evelyn Campbell Other Clinician: Referring Evelyn Campbell: Treating Kamarius Buckbee/Extender: Evelyn Campbell [name]: 3 Encounter Discharge Information Items Discharge Condition: Stable Ambulatory Status: Cane Discharge Destination: Home Transportation: Private  Auto Accompanied By: self Schedule Follow-up Appointment: Yes Clinical Summary of Care: Patient Declined Electronic Signature(s) Signed: 10/20/2022 5:28:34 PM By: Dellie Catholic RN Entered By: Dellie Catholic on 10/20/2022 16:38:48 Evelyn Campbell ([id number]) [id number] Page 2 of 7 -------------------------------------------------------------------------------- Lower Extremity Assessment Details Patient Name: Date of Service: Evelyn Campbell [facility] 10/20/2022 3:30 PM Medical Record Number: [id number] Patient Account Number: [id number] Date of Birth/Sex: Treating RN: December 03, 1955 (66 y.o. Evelyn Campbell Primary Care Viveka Wilmeth: Evelyn Campbell Other Clinician: Referring Linh Johannes: Treating Evelyn Campbell/Extender: Evelyn Campbell [name]: 3 Edema Assessment Assessed: [Left: No] [Right: No] [Left: Edema] [Right: :] Calf Left: Right: Point of Measurement: From Medial Instep 46 cm Ankle Left: Right: Point of Measurement: From Medial Instep 22 cm Vascular Assessment Pulses: Dorsalis Pedis Palpable: [Right:Yes] Electronic Signature(s) Signed: 10/21/2022 4:49:39 PM By: Blanche East RN Entered By: Blanche East on 10/20/2022 16:00:19 -------------------------------------------------------------------------------- Multi Wound Chart Details Patient Name: Date of Service: [name], [place] 10/20/2022 3:30 PM Medical Record Number: [id number] Patient Account Number: [id number] Date of Birth/Sex: Treating RN: May 19, 1956 (66 y.o. F) Primary Care Aretta Stetzel: Evelyn Campbell Other Clinician: Referring Matin Mattioli: Treating Evelyn Campbell/Extender: Evelyn Campbell [name]: 3 Vital Signs Height(in): Pulse(bpm): 8 Weight(lbs): Blood Pressure(mmHg): 99/64 Body Mass Index(BMI): Temperature(F): 97.9 Respiratory Rate(breaths/min): 18 [3:Photos:] [N/A:N/A] Right Foot N/A N/A Wound Location: Gradually Appeared N/A  N/A Wounding Event: Trauma, Other N/A N/A Primary Etiology: Lymphedema, Hypertension, N/A N/A Comorbid History: Peripheral Venous Disease, Gout, Rheumatoid Arthritis, Neuropathy 09/21/2022 N/A N/A Date Acquired: 3 N/A N/A Weeks of Treatment: Open N/A N/A Wound Status: No N/A N/A Wound Recurrence: 0.1x0.1x0.1 N/A N/A Measurements L x W x D (cm) 0.008 N/A N/A A (cm) : rea 0.001 N/A N/A Volume (cm) : 99.80% N/A N/A % Reduction in Area: 99.70% N/A N/A % Reduction in Volume: Full Thickness Without Exposed N/A N/A Classification: Support Structures Medium N/A N/A Exudate A mount: Serosanguineous N/A N/A Exudate Type: red, Campbell N/A N/A Exudate  Color: Distinct, outline attached N/A N/A Wound Margin: Large (67-100%) N/A N/A Granulation Amount: Red, Pink N/A N/A Granulation Quality: Small (1-33%) N/A N/A Necrotic Amount: Eschar, Adherent Slough N/A N/A Necrotic Tissue: Fat Layer (Subcutaneous Tissue): Yes N/A N/A Exposed Structures: Fascia: No Tendon: No Muscle: No Joint: No Bone: No None N/A N/A Epithelialization: Callus: Yes N/A N/A Periwound Skin Texture: Dry/Scaly: Yes N/A N/A Periwound Skin Moisture: No Abnormalities Noted N/A N/A Periwound Skin Color: No Abnormality N/A N/A Temperature: T Contact Cast otal N/A N/A Procedures Performed: Treatment Notes Wound #3 (Foot) Wound Laterality: Right Cleanser Soap and Water Discharge Instruction: May shower and wash wound with dial antibacterial soap and water prior to dressing change. Wound Cleanser Discharge Instruction: Cleanse the wound with wound cleanser prior to applying a clean dressing using gauze sponges, not tissue or cotton balls. Peri-Wound Care Zinc Oxide Ointment 30g tube Discharge Instruction: Apply Zinc Oxide to periwound with each dressing change Topical Primary Dressing KerraCel Ag Gelling Fiber Dressing, 2x2 in (silver alginate) Discharge Instruction: Apply silver alginate to  wound bed as instructed Secondary Dressing Woven Gauze Sponges 2x2 in Discharge Instruction: Apply over primary dressing as directed. Zetuvit Plus Silicone Border Dressing 4x4 (in/in) Discharge Instruction: Apply silicone border over primary dressing as directed. Secured With Compression Wrap Compression Stockings Environmental education officer) Signed: 10/20/2022 4:46:19 PM By: Evelyn Maudlin MD FACS Entered By: Evelyn Campbell on 10/20/2022 16:46:19 Evelyn Campbell ([id number]) [id number] Page 4 of 7 -------------------------------------------------------------------------------- Multi-Disciplinary Care Plan Details Patient Name: Date of Service: Evelyn Campbell [facility] 10/20/2022 3:30 PM Medical Record Number: [id number] Patient Account Number: [id number] Date of Birth/Sex: Treating RN: 06-25-1956 (66 y.o. Evelyn Campbell Primary Care Keeli Roberg: Evelyn Campbell Other Clinician: Referring Evelyn Campbell: Treating Evelyn Campbell/Extender: Evelyn Campbell [name]: 3 Active Inactive Peripheral Neuropathy Nursing Diagnoses: Knowledge deficit related to disease process and management of peripheral neurovascular dysfunction Potential alteration in peripheral tissue perfusion (select prior to confirmation of diagnosis) Goals: Patient/caregiver will verbalize understanding of disease process and disease management Date Initiated: 09/27/2022 Target Resolution Date: 11/05/2022 Goal Status: Active Interventions: Assess signs and symptoms of neuropathy upon admission and as needed Provide education on Management of Neuropathy and Related Ulcers Notes: Wound/Skin Impairment Nursing Diagnoses: Impaired tissue integrity Knowledge deficit related to ulceration/compromised skin integrity Goals: Patient/caregiver will verbalize understanding of skin care regimen Date Initiated: 09/27/2022 Target Resolution Date: 11/05/2022 Goal Status:  Active Interventions: Assess ulceration(s) every visit Treatment Activities: Skin care regimen initiated : 09/27/2022 Topical wound management initiated : 09/27/2022 Notes: Electronic Signature(s) Signed: 10/21/2022 4:49:39 PM By: Blanche East RN Entered By: Blanche East on 10/20/2022 16:05:05 -------------------------------------------------------------------------------- Pain Assessment Details Patient Name: Date of Service: [name], [place] 10/20/2022 3:30 PM Medical Record Number: [id number] Patient Account Number: [id number] Date of Birth/Sex: Treating RN: 1956-08-24 (66 y.o. Evelyn Campbell Primary Care Marquinn Meschke: Evelyn Campbell Other Clinician: Referring Sharis Keeran: Treating Evelyn Campbell/Extender: Evelyn Campbell [name]: [place], [place] ([id number]) [id number] Page 5 of 7 Active Problems Location of Pain Severity and Description of Pain Patient Has Paino No Site Locations Rate the pain. Current Pain Level: 0 Pain Management and Medication Current Pain Management: Electronic Signature(s) Signed: 10/21/2022 4:49:39 PM By: Blanche East RN Entered By: Blanche East on 10/20/2022 15:59:49 -------------------------------------------------------------------------------- Patient/Caregiver Education Details Patient Name: Date of Service: [name], RO [facility] 12/20/2023andnbsp3:30 PM Medical Record Number: [id number] Patient Account Number: [id number] Date of Birth/Gender: Treating RN: 06-Sep-1956 (66 y.o. Evelyn Campbell Primary Care Physician: Evelyn Campbell,  Evelyn Campbell Other Clinician: Referring Physician: Treating Physician/Extender: Evelyn Campbell [name]: 3 Education Assessment Education Provided To: Patient Education Topics Provided Wound/Skin Impairment: Methods: Explain/Verbal Responses: Reinforcements needed, State content correctly Electronic Signature(s) Signed: 10/21/2022 4:49:39  PM By: Blanche East RN Entered By: Blanche East on 10/20/2022 16:05:16 Evelyn Campbell ([id number]) [id number] Page 6 of 7 -------------------------------------------------------------------------------- Wound Assessment Details Patient Name: Date of Service: Evelyn Campbell [facility] 10/20/2022 3:30 PM Medical Record Number: [id number] Patient Account Number: [id number] Date of Birth/Sex: Treating RN: 1955-12-07 (66 y.o. Evelyn Campbell, Evelyn Campbell Primary Care Verginia Toohey: Evelyn Campbell Other Clinician: Referring Reba Hulett: Treating Jaiyla Granados/Extender: Evelyn Campbell [name]: 3 Wound Status Wound Number: 3 Primary Trauma, Other Etiology: Wound Location: Right Foot Wound Open Wounding Event: Gradually Appeared Status: Date Acquired: 09/21/2022 Comorbid Lymphedema, Hypertension, Peripheral Venous Disease, Gout, Weeks Of Treatment: 3 History: Rheumatoid Arthritis, Neuropathy Clustered Wound: No Photos Wound Measurements Length: (cm) 0.1 Width: (cm) 0.1 Depth: (cm) 0.1 Area: (cm) 0.008 Volume: (cm) 0.001 % Reduction in Area: 99.8% % Reduction in Volume: 99.7% Epithelialization: None Tunneling: No Undermining: No Wound Description Classification: Full Thickness Without Exposed Support Structures Wound Margin: Distinct, outline attached Exudate Amount: Medium Exudate Type: Serosanguineous Exudate Color: red, Campbell Foul Odor After Cleansing: No Slough/Fibrino Yes Wound Bed Granulation Amount: Large (67-100%) Exposed Structure Granulation Quality: Red, Pink Fascia Exposed: No Necrotic Amount: Small (1-33%) Fat Layer (Subcutaneous Tissue) Exposed: Yes Necrotic Quality: Eschar, Adherent Slough Tendon Exposed: No Muscle Exposed: No Joint Exposed: No Bone Exposed: No Periwound Skin Texture Texture Color No Abnormalities Noted: No No Abnormalities Noted: Yes Callus: Yes Temperature / Pain Temperature: No  Abnormality Moisture No Abnormalities Noted: No Dry / Scaly: Yes Treatment Notes Wound #3 (Foot) Wound Laterality: Right Cleanser Soap and Water Discharge Instruction: May shower and wash wound with dial antibacterial soap and water prior to dressing change. Wound Cleanser Discharge Instruction: Cleanse the wound with wound cleanser prior to applying a clean dressing using gauze sponges, not tissue or cotton balls. BATOOL, MAJID ([id number]) [id number] Page 7 of 7 Peri-Wound Care Zinc Oxide Ointment 30g tube Discharge Instruction: Apply Zinc Oxide to periwound with each dressing change Topical Primary Dressing KerraCel Ag Gelling Fiber Dressing, 2x2 in (silver alginate) Discharge Instruction: Apply silver alginate to wound bed as instructed Secondary Dressing Woven Gauze Sponges 2x2 in Discharge Instruction: Apply over primary dressing as directed. Zetuvit Plus Silicone Border Dressing 4x4 (in/in) Discharge Instruction: Apply silicone border over primary dressing as directed. Secured With Compression Wrap Compression Stockings Environmental education officer) Signed: 10/21/2022 4:49:39 PM By: Blanche East RN Entered By: Blanche East on 10/20/2022 16:03:13 -------------------------------------------------------------------------------- Vitals Details Patient Name: Date of Service: [name], RO [facility] 10/20/2022 3:30 PM Medical Record Number: [id number] Patient Account Number: [id number] Date of Birth/Sex: Treating RN: 1956-02-27 (66 y.o. Evelyn Campbell Primary Care Cathey Fredenburg: Evelyn Campbell Other Clinician: Referring Ishmeal Rorie: Treating Audrea Bolte/Extender: Evelyn Campbell [name]: 3 Vital Signs Time Taken: 15:59 Temperature (F): 97.9 Pulse (bpm): 71 Respiratory Rate (breaths/min): 18 Blood Pressure (mmHg): 99/64 Reference Range: 80 - 120 mg / dl Electronic Signature(s) Signed: 10/21/2022 4:49:39 PM By:  Blanche East RN Entered By: Blanche East on 10/20/2022 15:59:42

## 2022-10-21 NOTE — Progress Notes (Signed)
Evelyn Campbell ([id number]) [id number] Page 1 of 9 Visit Report for 10/20/2022 Chief Complaint Document Details Patient Name: Date of Service: Evelyn Campbell [facility] 10/20/2022 3:30 PM Medical Record Number: [id number] Patient Account Number: [id number] Date of Birth/Sex: Treating RN: 10/23/1956 (66 y.o. F) Primary Care Provider: Leitha Campbell Other Clinician: Referring Provider: Treating Provider/Extender: Evelyn Campbell [name]: 3 Information Obtained from: Patient Chief Complaint Patient seen for complaints of Non-Healing Wound. 09/27/2022: re-opening of wound R foot Electronic Signature(s) Signed: 10/20/2022 4:46:26 PM By: [name] MD FACS Entered By: [name] on 10/20/2022 16:46:25 -------------------------------------------------------------------------------- HPI Details Patient Name: Date of Service: Evelyn Campbell 10/20/2022 3:30 PM Medical Record Number: [id number] Patient Account Number: [id number] Date of Birth/Sex: Treating RN: 10/03/1956 (66 y.o. F) Primary Care Provider: Leitha Campbell Other Clinician: Referring Provider: Treating Provider/Extender: Evelyn Campbell [name]: 3 History of Present Illness HPI Description: ADMISSION 01/21/2022 This is a 66 year old woman presenting with an ulcer on the right fifth metatarsal head. She says that it has been present for about 6 weeks. She is not certain how it developed, but she does have peripheral neuropathy. She also has lymphedema, hypertension, metabolic syndrome/prediabetes, but does not carry an actual diagnosis of diabetes nor is she on any medications for this. 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 ABI in clinic today was 0.8. She has been treated in the past for lymphedema, but has been unable to tolerate compression. She does have lymphedema pumps but does not  use these regularly. She had been applying Neosporin and new skin to her wound, but it did not really change much. She was referred here by her primary care provider for further evaluation and management. 01/28/2022: 1 week follow-up. Apparently there was some miscommunication with home health and they did not come out to help her with her dressing changes. The patient and her husband have been doing them themselves. The wound actually looks quite good, with decreased dimensions and a very nice healthy surface. 02/05/2022: The home health nurse came out during the week and the patient was happy with the care. The wound is smaller today with a clean base. 02/12/2022: The wound continues to contract and is quite small today. There is a bit of callus buildup, but the wound surface itself is very clean. 02/19/2022: The wound is nearly closed. There is a small amount of callus buildup. No slough or drainage. 02/25/2022: The wound is down to just a sliver. She continues to build up callus. No concern for infection. 03/04/2022: The wound is closed. READMISSION 04/12/2022 Last week, the patient noticed a spot of blood on her stocking where her wound had been. She noted that the wound was open. She began applying the Prisma silver collagen that we had used during her last admission. She wrapped it up and on Friday when she changed the dressing, she noticed that there was a new wound on the dorsal part of her right foot overlying the fifth metatarsal. Today it is quite red and tender. There is slough on the surface. The wound on the plantar surface has periwound callus accumulation but otherwise appears fairly clean. Evelyn Campbell ([id number]) [id number] Page 2 of 9 04/19/2022: The culture that we took last week somehow has been lost and we have no results. The patient has been applying topical gentamicin and silver alginate to her wound, however and both of them looks smaller. The  redness and  tenderness has improved significantly. Her foot remains macerated in appearance, however. She had some bleeding from the plantar wound. On inspection, it looks like secondary to moisture, the surrounding skin and callus have lifted. When I probed with a thin cotton applicator, this created some minor bleeding and I think this is likely what happened. The tissue is just quite friable. 04/22/2022: The culture data were finally found and she did grow Proteus mirabilis from her wound. I prescribed Bactrim for her. She is here today for her first cast change. Both wound sites look cleaner and smaller today even after just a few days of casting. 04/26/2022: Both wounds are smaller and very clean. She is completing a course of Bactrim for Proteus mirabilis grown from her wound. 04/29/2022: The patient contacted our office yesterday concerned that the cast was pushing too hard against her great toe. She is here today to have the cast removed and a new one reapplied. The wound on the dorsal lateral part of her foot is closed. The wound on the plantar surface of the fifth metatarsal head is smaller and epithelializing. 05/06/2022: Her wounds are closed. READMISSION 09/27/2022 She returns with reopening of her wound on the right fifth metatarsal area after she healed in July, she was seen by Dr. Milinda Pointer at [facility]. She was apparently wearing accommodative orthotics that were in good condition and he recommended that she continue to wear those. He also identified that her shoes seemed narrow and that she should consider a wider shoe. We had recommended that she wear a callus pad or a foam donut of sorts to protect the area but she has not been doing this. She is wearing wide shoes but despite this, over the past week, she says that she opened up the wound and the exact same location as before. On inspection, it appears that she probably had developed a large callus that ultimately broke down  deep to the surface. The tissue is macerated and the fat layer is exposed. There is no purulent drainage or malodor. No visible signs of infection. 10/04/2022: Even after just a week, there has been a substantial improvement in her wound. There has been epithelialization such that it has split into 2 separate openings. There is some slough accumulation on the surface but the periwound is in better condition. 12/11; considerable improvement in the plantar foot wound. We replaced the total contact cast. The patient tells me she went to see podiatry for custom orthotics but she did not use them but now thinks she will spend the money to get them. I am not exactly sure where her podiatrist [Dr. Hyatt] practices but will she has an appointment in January. 10/20/2022: The wound is down to just a narrow superficial opening on the plantar surface of her foot. Electronic Signature(s) Signed: 10/20/2022 4:49:30 PM By: [name] MD FACS Entered By: [name] on 10/20/2022 16:49:30 -------------------------------------------------------------------------------- Physical Exam Details Patient Name: Date of Service: Evelyn ND, RO [facility] 10/20/2022 3:30 PM Medical Record Number: [id number] Patient Account Number: [id number] Date of Birth/Sex: Treating RN: 10-15-1956 (65 y.o. F) Primary Care Provider: Leitha Campbell Other Clinician: Referring Provider: Treating Provider/Extender: Evelyn Campbell [name]: 3 Constitutional Slightly hypotensive, asymptomatic. . . . No acute distress. Respiratory Normal work of breathing on room air. Notes 10/20/2022: The wound is down to just a narrow superficial opening on the plantar surface of her foot. Electronic Signature(s) Signed: 10/20/2022 4:50:20 PM By: [name] MD FACS  Entered By: [name] on 10/20/2022 16:50:20 Belue, [name] ([id number]) [id number] Page 3 of  9 -------------------------------------------------------------------------------- Physician Orders Details Patient Name: Date of Service: Evelyn Campbell [facility] 10/20/2022 3:30 PM Medical Record Number: [id number] Patient Account Number: [id number] Date of Birth/Sex: Treating RN: 06/23/1956 (66 y.o. Iver Nestle, Jamie Primary Care Provider: Leitha Campbell Other Clinician: Referring Provider: Treating Provider/Extender: Evelyn Campbell [name]: 3 Verbal / Phone Orders: No Diagnosis Coding ICD-10 Coding Code Description [phone] Non-pressure chronic ulcer of other part of right foot with fat layer exposed I87.2 Venous insufficiency (chronic) (peripheral) G62.9 Polyneuropathy, unspecified E66.01 Morbid (severe) obesity due to excess calories Follow-up Appointments ppointment in 1 week. - Dr. Celine Ahr - room 2 Return A Anesthetic (In clinic) Topical Lidocaine 4% applied to wound bed Bathing/ Shower/ Hygiene May shower with protection but do not get wound dressing(s) wet. - Please use cast protector (Can purchase cast protector from local pharmacy, such as [facility] or [facility]) Edema Control - Lymphedema / SCD / Other Elevate legs to the level of the heart or above for 30 minutes daily and/or when sitting, a frequency of: Avoid standing for long periods of time. Patient to wear own compression stockings every day. - L left Exercise regularly Off-Loading Total Contact Cast to Right Lower Extremity Removable cast walker boot to: - right foot Wound Treatment Wound #3 - Foot Wound Laterality: Right Cleanser: Soap and Water 1 x Per Week/30 Days Discharge Instructions: May shower and wash wound with dial antibacterial soap and water prior to dressing change. Cleanser: Wound Cleanser 1 x Per Week/30 Days Discharge Instructions: Cleanse the wound with wound cleanser prior to applying a clean dressing using gauze sponges, not tissue or cotton balls. Peri-Wound Care: Zinc  Oxide Ointment 30g tube 1 x Per Week/30 Days Discharge Instructions: Apply Zinc Oxide to periwound with each dressing change Prim Dressing: KerraCel Ag Gelling Fiber Dressing, 2x2 in (silver alginate) 1 x Per Week/30 Days ary Discharge Instructions: Apply silver alginate to wound bed as instructed Secondary Dressing: Woven Gauze Sponges 2x2 in 1 x Per Week/30 Days Discharge Instructions: Apply over primary dressing as directed. Secondary Dressing: Zetuvit Plus Silicone Border Dressing 4x4 (in/in) 1 x Per Week/30 Days Discharge Instructions: Apply silicone border over primary dressing as directed. Electronic Signature(s) Signed: 10/20/2022 4:53:29 PM By: [name] MD FACS Entered By: [name] on 10/20/2022 16:50:40 Problem List Details -------------------------------------------------------------------------------- [name] ([id number]) [id number] Page 4 of 9 Patient Name: Date of Service: Evelyn Campbell [facility] 10/20/2022 3:30 PM Medical Record Number: [id number] Patient Account Number: [id number] Date of Birth/Sex: Treating RN: [birth year] (66 y.o. F) Primary Care Provider: Leitha Campbell Other Clinician: Referring Provider: Treating Provider/Extender: Evelyn Campbell [name]: 3 Active Problems ICD-10 Encounter Code Description Active Date MDM Diagnosis [phone] Non-pressure chronic ulcer of other part of right foot with fat layer exposed 09/27/2022 No Yes I87.2 Venous insufficiency (chronic) (peripheral) 09/27/2022 No Yes G62.9 Polyneuropathy, unspecified 09/27/2022 No Yes E66.01 Morbid (severe) obesity due to excess calories 09/27/2022 No Yes Inactive Problems Resolved Problems Electronic Signature(s) Signed: 10/20/2022 4:46:12 PM By: [name] MD FACS Entered By: [name] on 10/20/2022 16:46:12 -------------------------------------------------------------------------------- Progress  Note Details Patient Name: Date of Service: Evelyn ND, RO [facility] 10/20/2022 3:30 PM Medical Record Number: [id number] Patient Account Number: [id number] Date of Birth/Sex: Treating RN: 1956-03-17 (66 y.o. F) Primary Care Provider: Leitha Campbell Other Clinician: Referring Provider: Treating Provider/Extender: Evelyn Campbell [name]:  3 Subjective Chief Complaint Information obtained from Patient Patient seen for complaints of Non-Healing Wound. 09/27/2022: re-opening of wound R foot History of Present Illness (HPI) ADMISSION 01/21/2022 This is a 66 year old woman presenting with an ulcer on the right fifth metatarsal head. She says that it has been present for about 6 weeks. She is not certain how it developed, but she does have peripheral neuropathy. She also has lymphedema, hypertension, metabolic syndrome/prediabetes, but does not carry an actual diagnosis of diabetes nor is she on any medications for this. 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 ABI in clinic today was 0.8. She has been treated in the past for lymphedema, but has been unable to tolerate compression. She does have lymphedema pumps but does not use these regularly. She had been applying Neosporin and new skin to her wound, but it did not really change much. She was referred here by her primary care provider for further evaluation and management. 01/28/2022: 1 week follow-up. Apparently there was some miscommunication with home health and they did not come out to help her with her dressing changes. The patient and her husband have been doing them themselves. The wound actually looks quite good, with decreased dimensions and a very nice healthy surface. 02/05/2022: The home health nurse came out during the week and the patient was happy with the care. The wound is smaller today with a clean base. TAILOR, LUCKING ([id number])  [id number] Page 5 of 9 02/12/2022: The wound continues to contract and is quite small today. There is a bit of callus buildup, but the wound surface itself is very clean. 02/19/2022: The wound is nearly closed. There is a small amount of callus buildup. No slough or drainage. 02/25/2022: The wound is down to just a sliver. She continues to build up callus. No concern for infection. 03/04/2022: The wound is closed. READMISSION 04/12/2022 Last week, the patient noticed a spot of blood on her stocking where her wound had been. She noted that the wound was open. She began applying the Prisma silver collagen that we had used during her last admission. She wrapped it up and on Friday when she changed the dressing, she noticed that there was a new wound on the dorsal part of her right foot overlying the fifth metatarsal. Today it is quite red and tender. There is slough on the surface. The wound on the plantar surface has periwound callus accumulation but otherwise appears fairly clean. 04/19/2022: The culture that we took last week somehow has been lost and we have no results. The patient has been applying topical gentamicin and silver alginate to her wound, however and both of them looks smaller. The redness and tenderness has improved significantly. Her foot remains macerated in appearance, however. She had some bleeding from the plantar wound. On inspection, it looks like secondary to moisture, the surrounding skin and callus have lifted. When I probed with a thin cotton applicator, this created some minor bleeding and I think this is likely what happened. The tissue is just quite friable. 04/22/2022: The culture data were finally found and she did grow Proteus mirabilis from her wound. I prescribed Bactrim for her. She is here today for her first cast change. Both wound sites look cleaner and smaller today even after just a few days of casting. 04/26/2022: Both wounds are smaller  and very clean. She is completing a course of Bactrim for Proteus mirabilis grown from her wound. 04/29/2022: The patient contacted our office yesterday  concerned that the cast was pushing too hard against her great toe. She is here today to have the cast removed and a new one reapplied. The wound on the dorsal lateral part of her foot is closed. The wound on the plantar surface of the fifth metatarsal head is smaller and epithelializing. 05/06/2022: Her wounds are closed. READMISSION 09/27/2022 She returns with reopening of her wound on the right fifth metatarsal area after she healed in July, she was seen by Dr. Milinda Pointer at [facility]. She was apparently wearing accommodative orthotics that were in good condition and he recommended that she continue to wear those. He also identified that her shoes seemed narrow and that she should consider a wider shoe. We had recommended that she wear a callus pad or a foam donut of sorts to protect the area but she has not been doing this. She is wearing wide shoes but despite this, over the past week, she says that she opened up the wound and the exact same location as before. On inspection, it appears that she probably had developed a large callus that ultimately broke down deep to the surface. The tissue is macerated and the fat layer is exposed. There is no purulent drainage or malodor. No visible signs of infection. 10/04/2022: Even after just a week, there has been a substantial improvement in her wound. There has been epithelialization such that it has split into 2 separate openings. There is some slough accumulation on the surface but the periwound is in better condition. 12/11; considerable improvement in the plantar foot wound. We replaced the total contact cast. The patient tells me she went to see podiatry for custom orthotics but she did not use them but now thinks she will spend the money to get them. I am not exactly sure where her podiatrist  [Dr. Hyatt] practices but will she has an appointment in January. 10/20/2022: The wound is down to just a narrow superficial opening on the plantar surface of her foot. Patient History Information obtained from Patient. Family History Heart Disease - Father, No family history of Cancer, Diabetes, Hereditary Spherocytosis, Hypertension, Kidney Disease, Lung Disease, Seizures, Stroke, Thyroid Problems, Tuberculosis. Social History Never smoker, Marital Status - Married, Alcohol Use - Never, Drug Use - No History, Caffeine Use - Never. Medical History Hematologic/Lymphatic Patient has history of Lymphedema Cardiovascular Patient has history of Hypertension, Peripheral Venous Disease Musculoskeletal Patient has history of Gout, Rheumatoid Arthritis - ankle Neurologic Patient has history of Neuropathy - Bilateral feet Hospitalization/Surgery History - Hernia repair (2016);Right ankle reconstruction. - insertion of mesh. - pancreatectomy. - toe surgery bilateral. - cholecystectomy. - abdominal hysterectomy. - tonsillectomy. - wrist surgery. Medical A Surgical History Notes nd Constitutional Symptoms (General Health) obesity Hematologic/Lymphatic Gout, vitamin D deficiency Cardiovascular PEriperal venous insufficiency Gastrointestinal Diverticular disease of colon: GERD, fatty liver Endocrine Prediabetes Neurologic Idiopathic peripheral neuropathy Clearman, Kealy ([id number]) [id number] Page 6 of 9 Objective Constitutional Slightly hypotensive, asymptomatic. No acute distress. Vitals Time Taken: 3:59 PM, Temperature: 97.9 F, Pulse: 71 bpm, Respiratory Rate: 18 breaths/min, Blood Pressure: 99/64 mmHg. Respiratory Normal work of breathing on room air. General Notes: 10/20/2022: The wound is down to just a narrow superficial opening on the plantar surface of her foot. Integumentary (Hair, Skin) Wound #3 status is Open. Original cause of wound was  Gradually Appeared. The date acquired was: 09/21/2022. The wound has been in treatment 3 weeks. The wound is located on the Right Foot. The wound measures 0.1cm length x 0.1cm  width x 0.1cm depth; 0.008cm^2 area and 0.001cm^3 volume. There is Fat Layer (Subcutaneous Tissue) exposed. There is no tunneling or undermining noted. There is a medium amount of serosanguineous drainage noted. The wound margin is distinct with the outline attached to the wound base. There is large (67-100%) red, pink granulation within the wound bed. There is a small (1-33%) amount of necrotic tissue within the wound bed including Eschar and Adherent Slough. The periwound skin appearance had no abnormalities noted for color. The periwound skin appearance exhibited: Callus, Dry/Scaly. Periwound temperature was noted as No Abnormality. Assessment Active Problems ICD-10 Non-pressure chronic ulcer of other part of right foot with fat layer exposed Venous insufficiency (chronic) (peripheral) Polyneuropathy, unspecified Morbid (severe) obesity due to excess calories Procedures Wound #3 Pre-procedure diagnosis of Wound #3 is a Trauma, Other located on the Right Foot . There was a T Programmer, multimedia Procedure by [name], MD. otal Post procedure Diagnosis Wound #3: Same as Pre-Procedure Plan Follow-up Appointments: Return Appointment in 1 week. - Dr. Celine Ahr - room 2 Anesthetic: (In clinic) Topical Lidocaine 4% applied to wound bed Bathing/ Shower/ Hygiene: May shower with protection but do not get wound dressing(s) wet. - Please use cast protector (Can purchase cast protector from local pharmacy, such as [facility] or [facility]) Edema Control - Lymphedema / SCD / Other: Elevate legs to the level of the heart or above for 30 minutes daily and/or when sitting, a frequency of: Avoid standing for long periods of time. Patient to wear own compression stockings every day. - L left Exercise regularly Off-Loading: T Contact Cast  to Right Lower Extremity otal Removable cast walker boot to: - right foot WOUND #3: - Foot Wound Laterality: Right Cleanser: Soap and Water 1 x Per Week/30 Days Discharge Instructions: May shower and wash wound with dial antibacterial soap and water prior to dressing change. Cleanser: Wound Cleanser 1 x Per Week/30 Days Discharge Instructions: Cleanse the wound with wound cleanser prior to applying a clean dressing using gauze sponges, not tissue or cotton balls. Peri-Wound Care: Zinc Oxide Ointment 30g tube 1 x Per Week/30 Days Discharge Instructions: Apply Zinc Oxide to periwound with each dressing change Prim Dressing: KerraCel Ag Gelling Fiber Dressing, 2x2 in (silver alginate) 1 x Per Week/30 Days ary Discharge Instructions: Apply silver alginate to wound bed as instructed Secondary Dressing: Woven Gauze Sponges 2x2 in 1 x Per Week/30 Days Bosserman, Lyna ([id number]) [id number] Page 7 of 9 Discharge Instructions: Apply over primary dressing as directed. Secondary Dressing: Zetuvit Plus Silicone Border Dressing 4x4 (in/in) 1 x Per Week/30 Days Discharge Instructions: Apply silicone border over primary dressing as directed. 10/20/2022: The wound is down to just a narrow superficial opening on the plantar surface of her foot. No debridement was necessary today. We will continue silver alginate as a contact layer. T contact cast was applied in standard fashion. She will follow-up otal in 1 week. Electronic Signature(s) Signed: 10/20/2022 4:51:39 PM By: [name] MD FACS Entered By: [name] on 10/20/2022 16:51:39 -------------------------------------------------------------------------------- HxROS Details Patient Name: Date of Service: Evelyn ND, RO [facility] 10/20/2022 3:30 PM Medical Record Number: [id number] Patient Account Number: [id number] Date of Birth/Sex: Treating RN: [birth year] (66 y.o. F) Primary Care Provider: Leitha Campbell Other  Clinician: Referring Provider: Treating Provider/Extender: Evelyn Campbell [name]: 3 Information Obtained From Patient Constitutional Symptoms (General Health) Medical History: Past Medical History Notes: obesity Hematologic/Lymphatic Medical History: Positive for: Lymphedema Past Medical History Notes: Gout, vitamin D deficiency  Cardiovascular Medical History: Positive for: Hypertension; Peripheral Venous Disease Past Medical History Notes: PEriperal venous insufficiency Gastrointestinal Medical History: Past Medical History Notes: Diverticular disease of colon: GERD, fatty liver Endocrine Medical History: Past Medical History Notes: Prediabetes Musculoskeletal Medical History: Positive for: Gout; Rheumatoid Arthritis - ankle Neurologic Medical History: Positive for: Neuropathy - Bilateral feet Past Medical History NotesATALIA, LITZINGER ([id number]) [id number] Page 8 of 9 Idiopathic peripheral neuropathy Immunizations Pneumococcal Vaccine: Received Pneumococcal Vaccination: Yes Received Pneumococcal Vaccination On or After 60th Birthday: Yes Implantable Devices None Hospitalization / Surgery History Type of Hospitalization/Surgery Hernia repair (2016);Right ankle reconstruction insertion of mesh pancreatectomy toe surgery bilateral cholecystectomy abdominal hysterectomy tonsillectomy wrist surgery Family and Social History Cancer: No; Diabetes: No; Heart Disease: Yes - Father; Hereditary Spherocytosis: No; Hypertension: No; Kidney Disease: No; Lung Disease: No; Seizures: No; Stroke: No; Thyroid Problems: No; Tuberculosis: No; Never smoker; Marital Status - Married; Alcohol Use: Never; Drug Use: No History; Caffeine Use: Never; Financial Concerns: No; Food, Clothing or Shelter Needs: No; Support System Lacking: No; Transportation Concerns: No Electronic Signature(s) Signed: 10/20/2022 4:53:29 PM By:  [name] MD FACS Entered By: [name] on 10/20/2022 16:49:36 -------------------------------------------------------------------------------- Total Contact Cast Details Patient Name: Date of Service: Evelyn ND, RO [facility] 10/20/2022 3:30 PM Medical Record Number: [id number] Patient Account Number: [id number] Date of Birth/Sex: Treating RN: [birth year] (66 y.o. [name] Primary Care Provider: Leitha Campbell Other Clinician: Referring Provider: Treating Provider/Extender: Evelyn Campbell [name]: 3 T Contact Cast Applied for Wound Assessment: otal Wound #3 Right Foot Performed By: Physician [name], MD Post Procedure Diagnosis Same as Pre-procedure Electronic Signature(s) Signed: 10/20/2022 4:53:29 PM By: [name] MD FACS Signed: 10/20/2022 5:28:34 PM By: Dellie Catholic RN Entered By: Dellie Catholic on 10/20/2022 16:17:43 -------------------------------------------------------------------------------- [place] Details Patient Name: Date of Service: Evelyn ND, RO [facility] 10/20/2022 Medical Record Number: [id number] Patient Account Number: [id number] Date of Birth/Sex: Treating RN: [birth year] (66 y.o. 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.2 Venous insufficiency (chronic) (peripheral) G62.9 Polyneuropathy, unspecified E66.01 Morbid (severe) obesity due to excess calories Facility Procedures : CPT4 Code: 32122482 Description: 50037 - APPLY TOTAL CONTACT LEG CAST ICD-10 Diagnosis Description L97.512 Non-pressure chronic ulcer of other part of right foot with fat layer  exposed Modifier: Quantity: 1 Physician Procedures : CPT4 Code Description Modifier [id number] - WC PHYS LEVEL 3 - EST PT ICD-10 Diagnosis Description L97.512 Non-pressure chronic ulcer of other part of right foot with fat layer exposed I87.2 Venous insufficiency (chronic) (peripheral) G62.9  Polyneuropathy, unspecified E66.01 Morbid (severe) obesity due to excess calories Quantity: 1 : [id number] - WC PHYS APPLY TOTAL CONTACT CAST ICD-10 Diagnosis Description L97.512 Non-pressure chronic ulcer of other part of right foot with fat layer exposed Quantity: 1 Electronic Signature(s) Signed: 10/20/2022 4:52:03 PM By: [name] MD FACS Entered By: [name] on 10/20/2022 16:52:02

## 2022-10-27 ENCOUNTER — Encounter (HOSPITAL_BASED_OUTPATIENT_CLINIC_OR_DEPARTMENT_OTHER): Payer: Medicare Other | Admitting: General Surgery

## 2022-10-27 DIAGNOSIS — L97512 Non-pressure chronic ulcer of other part of right foot with fat layer exposed: Secondary | ICD-10-CM | POA: Diagnosis not present

## 2022-10-27 NOTE — Progress Notes (Signed)
Evelyn Campbell, Evelyn Campbell ([id number]) [id number] Page 1 of 8 Visit Report for 10/27/2022 Chief Complaint Document Details Patient Name: Date of Service: Evelyn Campbell [facility] 10/27/2022 2:15 PM Medical Record Number: [id number] Patient Account Number: [id number] Date of Birth/Sex: Treating RN: October 24, 1956 (66 y.o. F) Primary Care Provider: [name] Other Clinician: Referring Provider: Treating Provider/Extender: [name] [name]: 4 Information Obtained from: Patient Chief Complaint Patient seen for complaints of Non-Healing Wound. 09/27/2022: re-opening of wound R foot Electronic Signature(s) Signed: 10/27/2022 2:52:03 PM By: [name] MD FACS Entered By: [name] on 10/27/2022 14:52:02 -------------------------------------------------------------------------------- HPI Details Patient Name: Date of Service: Evelyn Campbell, Evelyn Campbell 10/27/2022 2:15 PM Medical Record Number: [id number] Patient Account Number: [id number] Date of Birth/Sex: Treating RN: 10-Nov-1955 (66 y.o. F) Primary Care Provider: [name] Other Clinician: Referring Provider: Treating Provider/Extender: [name] [name]: 4 History of Present Illness HPI Description: ADMISSION 01/21/2022 This is a 66 year old woman presenting with an ulcer on the right fifth metatarsal head. She says that it has been present for about 6 weeks. She is not certain how it developed, but she does have peripheral neuropathy. She also has lymphedema, hypertension, metabolic syndrome/prediabetes, but does not carry an actual diagnosis of diabetes nor is she on any medications for this. 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 ABI in clinic today was 0.8. She has been treated in the past for lymphedema, but has been unable to tolerate compression. She does have lymphedema pumps but does not  use these regularly. She had been applying Neosporin and new skin to her wound, but it did not really change much. She was referred here by her primary care provider for further evaluation and management. 01/28/2022: 1 week follow-up. Apparently there was some miscommunication with home health and they did not come out to help her with her dressing changes. The patient and her husband have been doing them themselves. The wound actually looks quite good, with decreased dimensions and a very nice healthy surface. 02/05/2022: The home health nurse came out during the week and the patient was happy with the care. The wound is smaller today with a clean base. 02/12/2022: The wound continues to contract and is quite small today. There is a bit of callus buildup, but the wound surface itself is very clean. 02/19/2022: The wound is nearly closed. There is a small amount of callus buildup. No slough or drainage. 02/25/2022: The wound is down to just a sliver. She continues to build up callus. No concern for infection. 03/04/2022: The wound is closed. READMISSION 04/12/2022 Last week, the patient noticed a spot of blood on her stocking where her wound had been. She noted that the wound was open. She began applying the Prisma silver collagen that we had used during her last admission. She wrapped it up and on Friday when she changed the dressing, she noticed that there was a new wound on the dorsal part of her right foot overlying the fifth metatarsal. Today it is quite red and tender. There is slough on the surface. The wound on the plantar surface has periwound callus accumulation but otherwise appears fairly clean. Evelyn Campbell, Evelyn Campbell ([id number]) [id number] Page 2 of 8 04/19/2022: The culture that we took last week somehow has been lost and we have no results. The patient has been applying topical gentamicin and silver alginate to her wound, however and both of them looks smaller. The  redness and  tenderness has improved significantly. Her foot remains macerated in appearance, however. She had some bleeding from the plantar wound. On inspection, it looks like secondary to moisture, the surrounding skin and callus have lifted. When I probed with a thin cotton applicator, this created some minor bleeding and I think this is likely what happened. The tissue is just quite friable. 04/22/2022: The culture data were finally found and she did grow Proteus mirabilis from her wound. I prescribed Bactrim for her. She is here today for her first cast change. Both wound sites look cleaner and smaller today even after just a few days of casting. 04/26/2022: Both wounds are smaller and very clean. She is completing a course of Bactrim for Proteus mirabilis grown from her wound. 04/29/2022: The patient contacted our office yesterday concerned that the cast was pushing too hard against her great toe. She is here today to have the cast removed and a new one reapplied. The wound on the dorsal lateral part of her foot is closed. The wound on the plantar surface of the fifth metatarsal head is smaller and epithelializing. 05/06/2022: Her wounds are closed. READMISSION 09/27/2022 She returns with reopening of her wound on the right fifth metatarsal area after she healed in July, she was seen by Dr. Milinda Pointer at [facility]. She was apparently wearing accommodative orthotics that were in good condition and he recommended that she continue to wear those. He also identified that her shoes seemed narrow and that she should consider a wider shoe. We had recommended that she wear a callus pad or a foam donut of sorts to protect the area but she has not been doing this. She is wearing wide shoes but despite this, over the past week, she says that she opened up the wound and the exact same location as before. On inspection, it appears that she probably had developed a large callus that ultimately broke down  deep to the surface. The tissue is macerated and the fat layer is exposed. There is no purulent drainage or malodor. No visible signs of infection. 10/04/2022: Even after just a week, there has been a substantial improvement in her wound. There has been epithelialization such that it has split into 2 separate openings. There is some slough accumulation on the surface but the periwound is in better condition. 12/11; considerable improvement in the plantar foot wound. We replaced the total contact cast. The patient tells me she went to see podiatry for custom orthotics but she did not use them but now thinks she will spend the money to get them. I am not exactly sure where her podiatrist [Dr. Hyatt] practices but will she has an appointment in January. 10/20/2022: The wound is down to just a narrow superficial opening on the plantar surface of her foot. 10/27/2022: Her wound is healed. Electronic Signature(s) Signed: 10/27/2022 2:52:29 PM By: [name] MD FACS Entered By: [name] on 10/27/2022 14:52:29 -------------------------------------------------------------------------------- Physical Exam Details Patient Name: Date of Service: Evelyn Campbell, Evelyn Campbell [facility] 10/27/2022 2:15 PM Medical Record Number: [id number] Patient Account Number: [id number] Date of Birth/Sex: Treating RN: Apr 03, 1956 (66 y.o. F) Primary Care Provider: [name] Other Clinician: Referring Provider: Treating Provider/Extender: [name] [name]: 4 Constitutional . . . . No acute distress. Notes 10/27/2022: Her wound is healed. Electronic Signature(s) Signed: 10/27/2022 2:53:02 PM By: [name] MD FACS Entered By: [name] on 10/27/2022 14:53:02 Physician Orders Details -------------------------------------------------------------------------------- [name] ([id number]) [id number] Page 3 of  8 Patient Name: Date of  Service: Evelyn Campbell [facility] 10/27/2022 2:15 PM Medical Record Number: [id number] Patient Account Number: [id number] Date of Birth/Sex: Treating RN: [birth year] (66 y.o. [name] Primary Care Provider: [name] Other Clinician: Referring Provider: Treating Provider/Extender: [name] [name]: 4 Verbal / Phone Orders: No Diagnosis Coding ICD-10 Coding Code Description [phone] Non-pressure chronic ulcer of other part of right foot with fat layer exposed I87.2 Venous insufficiency (chronic) (peripheral) G62.9 Polyneuropathy, unspecified E66.01 Morbid (severe) obesity due to excess calories Discharge From [facility] Discharge from [place]!!!!!!!! Edema Control - Lymphedema / SCD / Other Avoid standing for long periods of time. Patient to wear own compression stockings every day. - L left Exercise regularly Off-Loading Other: - pad foot pressure point at all times when wearing a shoe Electronic Signature(s) Signed: 10/27/2022 3:07:18 PM By: [name] MD FACS Entered By: [name] on 10/27/2022 14:53:22 -------------------------------------------------------------------------------- Problem List Details Patient Name: Date of Service: Evelyn Campbell, Evelyn Campbell [facility] 10/27/2022 2:15 PM Medical Record Number: [id number] Patient Account Number: [id number] Date of Birth/Sex: Treating RN: 12/15/1955 (66 y.o. F) Primary Care Provider: [name] Other Clinician: Referring Provider: Treating Provider/Extender: [name] [name]: 4 Active Problems ICD-10 Encounter Code Description Active Date MDM Diagnosis [phone] Non-pressure chronic ulcer of other part of right foot with fat layer exposed 09/27/2022 No Yes I87.2 Venous insufficiency (chronic) (peripheral) 09/27/2022 No Yes G62.9 Polyneuropathy, unspecified 09/27/2022 No Yes E66.01 Morbid (severe) obesity due  to excess calories 09/27/2022 No Yes Inactive Problems Evelyn Campbell, Evelyn Campbell ([id number]) [phone].pdf Page 4 of 8 Resolved Problems Electronic Signature(s) Signed: 10/27/2022 2:50:57 PM By: [name] MD FACS Entered By: [name] on 10/27/2022 14:50:57 -------------------------------------------------------------------------------- Progress Note Details Patient Name: Date of Service: Evelyn Campbell, Evelyn Campbell [facility] 10/27/2022 2:15 PM Medical Record Number: [id number] Patient Account Number: [id number] Date of Birth/Sex: Treating RN: [birth year] (66 y.o. F) Primary Care Provider: [name] Other Clinician: Referring Provider: Treating Provider/Extender: [name] [name]: 4 Subjective Chief Complaint Information obtained from Patient Patient seen for complaints of Non-Healing Wound. 09/27/2022: re-opening of wound R foot History of Present Illness (HPI) ADMISSION 01/21/2022 This is a 66 year old woman presenting with an ulcer on the right fifth metatarsal head. She says that it has been present for about 6 weeks. She is not certain how it developed, but she does have peripheral neuropathy. She also has lymphedema, hypertension, metabolic syndrome/prediabetes, but does not carry an actual diagnosis of diabetes nor is she on any medications for this. 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 ABI in clinic today was 0.8. She has been treated in the past for lymphedema, but has been unable to tolerate compression. She does have lymphedema pumps but does not use these regularly. She had been applying Neosporin and new skin to her wound, but it did not really change much. She was referred here by her primary care provider for further evaluation and management. 01/28/2022: 1 week follow-up. Apparently there was some miscommunication with home health and they did not come out to help her with  her dressing changes. The patient and her husband have been doing them themselves. The wound actually looks quite good, with decreased dimensions and a very nice healthy surface. 02/05/2022: The home health nurse came out during the week and the patient was happy with the care. The wound is smaller today with a  clean base. 02/12/2022: The wound continues to contract and is quite small today. There is a bit of callus buildup, but the wound surface itself is very clean. 02/19/2022: The wound is nearly closed. There is a small amount of callus buildup. No slough or drainage. 02/25/2022: The wound is down to just a sliver. She continues to build up callus. No concern for infection. 03/04/2022: The wound is closed. READMISSION 04/12/2022 Last week, the patient noticed a spot of blood on her stocking where her wound had been. She noted that the wound was open. She began applying the Prisma silver collagen that we had used during her last admission. She wrapped it up and on Friday when she changed the dressing, she noticed that there was a new wound on the dorsal part of her right foot overlying the fifth metatarsal. Today it is quite red and tender. There is slough on the surface. The wound on the plantar surface has periwound callus accumulation but otherwise appears fairly clean. 04/19/2022: The culture that we took last week somehow has been lost and we have no results. The patient has been applying topical gentamicin and silver alginate to her wound, however and both of them looks smaller. The redness and tenderness has improved significantly. Her foot remains macerated in appearance, however. She had some bleeding from the plantar wound. On inspection, it looks like secondary to moisture, the surrounding skin and callus have lifted. When I probed with a thin cotton applicator, this created some minor bleeding and I think this is likely what happened. The tissue is just quite friable. 04/22/2022: The culture  data were finally found and she did grow Proteus mirabilis from her wound. I prescribed Bactrim for her. She is here today for her first cast change. Both wound sites look cleaner and smaller today even after just a few days of casting. 04/26/2022: Both wounds are smaller and very clean. She is completing a course of Bactrim for Proteus mirabilis grown from her wound. 04/29/2022: The patient contacted our office yesterday concerned that the cast was pushing too hard against her great toe. She is here today to have the cast removed and a new one reapplied. The wound on the dorsal lateral part of her foot is closed. The wound on the plantar surface of the fifth metatarsal head is smaller and epithelializing. 05/06/2022: Her wounds are closed. READMISSION 09/27/2022 She returns with reopening of her wound on the right fifth metatarsal area after she healed in July, she was seen by Dr. Milinda Pointer at [facility]. She was apparently wearing accommodative orthotics that were in good condition and he recommended that she continue to wear those. He also identified that her shoes seemed narrow and that she should consider a wider shoe. We had recommended that she wear a callus pad or a foam donut of sorts to protect the area but she has not been doing this. She is wearing wide shoes but despite this, over the past week, she says that she opened up the wound and the exact same location as before. Evelyn Campbell, Evelyn Campbell ([id number]) [id number] Page 5 of 8 On inspection, it appears that she probably had developed a large callus that ultimately broke down deep to the surface. The tissue is macerated and the fat layer is exposed. There is no purulent drainage or malodor. No visible signs of infection. 10/04/2022: Even after just a week, there has been a substantial improvement in her wound. There has been epithelialization such that it has split  into 2 separate openings. There is some slough  accumulation on the surface but the periwound is in better condition. 12/11; considerable improvement in the plantar foot wound. We replaced the total contact cast. The patient tells me she went to see podiatry for custom orthotics but she did not use them but now thinks she will spend the money to get them. I am not exactly sure where her podiatrist [Dr. Hyatt] practices but will she has an appointment in January. 10/20/2022: The wound is down to just a narrow superficial opening on the plantar surface of her foot. 10/27/2022: Her wound is healed. Patient History Information obtained from Patient. Family History Heart Disease - Father, No family history of Cancer, Diabetes, Hereditary Spherocytosis, Hypertension, Kidney Disease, Lung Disease, Seizures, Stroke, Thyroid Problems, Tuberculosis. Social History Never smoker, Marital Status - Married, Alcohol Use - Never, Drug Use - No History, Caffeine Use - Never. Medical History Hematologic/Lymphatic Patient has history of Lymphedema Cardiovascular Patient has history of Hypertension, Peripheral Venous Disease Musculoskeletal Patient has history of Gout, Rheumatoid Arthritis - ankle Neurologic Patient has history of Neuropathy - Bilateral feet Hospitalization/Surgery History - Hernia repair (2016);Right ankle reconstruction. - insertion of mesh. - pancreatectomy. - toe surgery bilateral. - cholecystectomy. - abdominal hysterectomy. - tonsillectomy. - wrist surgery. Medical A Surgical History Notes Campbell Constitutional Symptoms (General Health) obesity Hematologic/Lymphatic Gout, vitamin D deficiency Cardiovascular PEriperal venous insufficiency Gastrointestinal Diverticular disease of colon: GERD, fatty liver Endocrine Prediabetes Neurologic Idiopathic peripheral neuropathy Objective Constitutional No acute distress. Vitals Time Taken: 2:27 PM, Temperature: 98.5 F, Pulse: 65 bpm, Respiratory Rate: 18 breaths/min, Blood  Pressure: 122/84 mmHg. General Notes: 10/27/2022: Her wound is healed. Integumentary (Hair, Skin) Wound #3 status is Open. Original cause of wound was Gradually Appeared. The date acquired was: 09/21/2022. The wound has been in treatment 4 weeks. The wound is located on the Right Foot. The wound measures 0cm length x 0cm width x 0cm depth; 0cm^2 area and 0cm^3 volume. There is no tunneling or undermining noted. There is a none present amount of drainage noted. The wound margin is flat and intact. There is no granulation within the wound bed. There is no necrotic tissue within the wound bed. The periwound skin appearance had no abnormalities noted for texture. The periwound skin appearance had no abnormalities noted for moisture. The periwound skin appearance had no abnormalities noted for color. Periwound temperature was noted as No Abnormality. Assessment Active Problems ICD-10 Evelyn Campbell, Evelyn Campbell ([id number]) [id number].pdf Page 6 of 8 Non-pressure chronic ulcer of other part of right foot with fat layer exposed Venous insufficiency (chronic) (peripheral) Polyneuropathy, unspecified Morbid (severe) obesity due to excess calories Plan Discharge From [facility] Services: Discharge from [place]!!!!!!!! Edema Control - Lymphedema / SCD / Other: Avoid standing for long periods of time. Patient to wear own compression stockings every day. - L left Exercise regularly Off-Loading: Other: - pad foot pressure point at all times when wearing a shoe 10/27/2022: Her wound is healed. I encouraged her to follow through with getting the custom orthotics made. In the meantime, I emphasized the importance of keeping the area protected and padded anytime she is wearing shoes. I think that if she does not do this, she will be back care with the wound in very short order. For now, we will discharge her from the wound care center and she may follow-up as  needed. Electronic Signature(s) Signed: 10/27/2022 2:54:09 PM By: [name] MD FACS Entered By: [name] on 10/27/2022 14:54:09 --------------------------------------------------------------------------------  HxROS Details Patient Name: Date of Service: Evelyn Campbell [facility] 10/27/2022 2:15 PM Medical Record Number: [id number] Patient Account Number: [id number] Date of Birth/Sex: Treating RN: 1956-01-06 (66 y.o. F) Primary Care Provider: [name] Other Clinician: Referring Provider: Treating Provider/Extender: [name] [name]: 4 Information Obtained From Patient Constitutional Symptoms (General Health) Medical History: Past Medical History Notes: obesity Hematologic/Lymphatic Medical History: Positive for: Lymphedema Past Medical History Notes: Gout, vitamin D deficiency Cardiovascular Medical History: Positive for: Hypertension; Peripheral Venous Disease Past Medical History Notes: PEriperal venous insufficiency Gastrointestinal Medical History: Past Medical History Notes: Diverticular disease of colon: GERD, fatty liver Endocrine Evelyn Campbell, Evelyn Campbell ([id number]) [id number] Page 7 of 8 Medical History: Past Medical History Notes: Prediabetes Musculoskeletal Medical History: Positive for: Gout; Rheumatoid Arthritis - ankle Neurologic Medical History: Positive for: Neuropathy - Bilateral feet Past Medical History Notes: Idiopathic peripheral neuropathy Immunizations Pneumococcal Vaccine: Received Pneumococcal Vaccination: Yes Received Pneumococcal Vaccination On or After 60th Birthday: Yes Implantable Devices None Hospitalization / Surgery History Type of Hospitalization/Surgery Hernia repair (2016);Right ankle reconstruction insertion of mesh pancreatectomy toe surgery bilateral cholecystectomy abdominal hysterectomy tonsillectomy wrist surgery Family and Social  History Cancer: No; Diabetes: No; Heart Disease: Yes - Father; Hereditary Spherocytosis: No; Hypertension: No; Kidney Disease: No; Lung Disease: No; Seizures: No; Stroke: No; Thyroid Problems: No; Tuberculosis: No; Never smoker; Marital Status - Married; Alcohol Use: Never; Drug Use: No History; Caffeine Use: Never; Financial Concerns: No; Food, Clothing or Shelter Needs: No; Support System Lacking: No; Transportation Concerns: No Electronic Signature(s) Signed: 10/27/2022 3:07:18 PM By: [name] MD FACS Entered By: [name] on 10/27/2022 14:52:35 -------------------------------------------------------------------------------- SuperBill Details Patient Name: Date of Service: Evelyn Campbell, [place] 10/27/2022 Medical Record Number: [id number] Patient Account Number: [id number] Date of Birth/Sex: Treating RN: 10-Dec-1955 (66 y.o. [name] Primary Care Provider: [name] Other Clinician: Referring Provider: Treating Provider/Extender: [name] [name]: 4 Diagnosis Coding ICD-10 Codes Code Description [phone] Non-pressure chronic ulcer of other part of right foot with fat layer exposed I87.2 Venous insufficiency (chronic) (peripheral) G62.9 Polyneuropathy, unspecified E66.01 Morbid (severe) obesity due to excess calories Facility Procedures : TAMEAH, MIHALKO 76 CPT4 Code: OSLYNN ([id number]) [id number] Description: [id number] 3 - WOUND CARE VISIT-LEV 3 EST PT Modifier: [id number] Quantity: 227.pdf Page 8 of 8 Physician Procedures : CPT4 Code Description Modifier [id number] - WC PHYS LEVEL 3 - EST PT ICD-10 Diagnosis Description L97.512 Non-pressure chronic ulcer of other part of right foot with fat layer exposed I87.2 Venous insufficiency (chronic) (peripheral) G62.9  Polyneuropathy, unspecified E66.01 Morbid (severe) obesity due to excess calories Quantity: 1 Electronic Signature(s) Signed: 10/27/2022  2:54:33 PM By: [name] MD FACS Entered By: [name] on 10/27/2022 14:54:33

## 2022-10-27 NOTE — Progress Notes (Signed)
NORMAN, BIER ([id number]) [id number] Page 1 of 8 Visit Report for 10/27/2022 Arrival Information Details Patient Name: Date of Service: Evelyn Campbell ND, Evelyn Campbell [facility] 10/27/2022 2:15 PM Medical Record Number: [id number] Patient Account Number: [id number] Date of Birth/Sex: Treating RN: 02-28-1956 (66 y.o. Evelyn Campbell Primary Care Franklin Baumbach: [name] Other Clinician: Referring Evelyn Campbell: Treating Evelyn Campbell/Extender: [name] [name]: 4 Visit Information History Since Last Visit Added or deleted any medications: No Patient Arrived: Ambulatory Any new allergies or adverse reactions: No Arrival Time: 14:24 Had a fall or experienced change in No Accompanied By: self activities of daily living that may affect Transfer Assistance: None risk of falls: Patient Identification Verified: Yes Signs or symptoms of abuse/neglect since last visito No Secondary Verification Process Completed: Yes Hospitalized since last visit: No Patient Has Alerts: Yes Implantable device outside of the clinic excluding No Patient Alerts: R: 1.19 cellular tissue based products placed in the center since last visit: Has Dressing in Place as Prescribed: Yes Pain Present Now: No Electronic Signature(s) Signed: 10/27/2022 3:06:47 PM By: Adline Peals Entered By: Adline Peals on 10/27/2022 14:27:00 -------------------------------------------------------------------------------- Clinic Level of Care Assessment Details Patient Name: Date of Service: Evelyn Campbell [facility] 10/27/2022 2:15 PM Medical Record Number: [id number] Patient Account Number: [id number] Date of Birth/Sex: Treating RN: [birth year] (66 y.o. Evelyn Campbell Primary Care Clarrisa Kaylor: [name] Other Clinician: Referring Evelyn Campbell: Treating Evelyn Campbell/Extender: [name] [name]: 4 Clinic Level of Care Assessment Items TOOL 4  Quantity Score X- 1 0 Use when only an EandM is performed on FOLLOW-UP visit ASSESSMENTS - Nursing Assessment / Reassessment X- 1 10 Reassessment of Co-morbidities (includes updates in patient status) X- 1 5 Reassessment of Adherence to Treatment Plan ASSESSMENTS - Wound and Skin A ssessment / Reassessment X - Simple Wound Assessment / Reassessment - one wound 1 5 '[]'$  - 0 Complex Wound Assessment / Reassessment - multiple wounds '[]'$  - 0 Dermatologic / Skin Assessment (not related to wound area) ASSESSMENTS - Focused Assessment X- 1 5 Circumferential Edema Measurements - multi extremities '[]'$  - 0 Nutritional Assessment / Counseling / Intervention Evelyn, Campbell ([id number]) [id number] Page 2 of 8 X- 1 5 Lower Extremity Assessment (monofilament, tuning fork, pulses) '[]'$  - 0 Peripheral Arterial Disease Assessment (using hand held doppler) ASSESSMENTS - Ostomy and/or Continence Assessment and Care '[]'$  - 0 Incontinence Assessment and Management '[]'$  - 0 Ostomy Care Assessment and Management (repouching, etc.) PROCESS - Coordination of Care X - Simple Patient / Family Education for ongoing care 1 15 '[]'$  - 0 Complex (extensive) Patient / Family Education for ongoing care X- 1 10 Staff obtains Programmer, systems, Records, T Results / Process Orders est '[]'$  - 0 Staff telephones HHA, Nursing Homes / Clarify orders / etc '[]'$  - 0 Routine Transfer to another Facility (non-emergent condition) '[]'$  - 0 Routine Hospital Admission (non-emergent condition) '[]'$  - 0 New Admissions / Biomedical engineer / Ordering NPWT Apligraf, etc. , '[]'$  - 0 Emergency Hospital Admission (emergent condition) X- 1 10 Simple Discharge Coordination '[]'$  - 0 Complex (extensive) Discharge Coordination PROCESS - Special Needs '[]'$  - 0 Pediatric / Minor Patient Management '[]'$  - 0 Isolation Patient Management '[]'$  - 0 Hearing / Language / Visual special needs '[]'$  - 0 Assessment of Community assistance  (transportation, D/C planning, etc.) '[]'$  - 0 Additional assistance / Altered mentation '[]'$  - 0 Support Surface(s) Assessment (bed, cushion, seat, etc.) INTERVENTIONS - Wound Cleansing / Measurement X - Simple Wound Cleansing -  one wound 1 5 '[]'$  - 0 Complex Wound Cleansing - multiple wounds X- 1 5 Wound Imaging (photographs - any number of wounds) '[]'$  - 0 Wound Tracing (instead of photographs) X- 1 5 Simple Wound Measurement - one wound '[]'$  - 0 Complex Wound Measurement - multiple wounds INTERVENTIONS - Wound Dressings X - Small Wound Dressing one or multiple wounds 1 10 '[]'$  - 0 Medium Wound Dressing one or multiple wounds '[]'$  - 0 Large Wound Dressing one or multiple wounds '[]'$  - 0 Application of Medications - topical '[]'$  - 0 Application of Medications - injection INTERVENTIONS - Miscellaneous '[]'$  - 0 External ear exam '[]'$  - 0 Specimen Collection (cultures, biopsies, blood, body fluids, etc.) '[]'$  - 0 Specimen(s) / Culture(s) sent or taken to Lab for analysis '[]'$  - 0 Patient Transfer (multiple staff / Civil Service fast streamer / Similar devices) '[]'$  - 0 Simple Staple / Suture removal (25 or less) '[]'$  - 0 Complex Staple / Suture removal (26 or more) '[]'$  - 0 Hypo / Hyperglycemic Management (close monitor of Blood Glucose) Evelyn Campbell, Evelyn Campbell ([id number]) [id number] Page 3 of 8 '[]'$  - 0 Ankle / Brachial Index (ABI) - do not check if billed separately X- 1 5 Vital Signs Has the patient been seen at the hospital within the last three years: Yes Total Score: 95 Level Of Care: New/Established - Level 3 Electronic Signature(s) Signed: 10/27/2022 3:06:47 PM By: Adline Peals Entered By: Adline Peals on 10/27/2022 14:49:13 -------------------------------------------------------------------------------- Encounter Discharge Information Details Patient Name: Date of Service: [name], RO [facility] 10/27/2022 2:15 PM Medical Record Number: [id number] Patient Account Number:  [id number] Date of Birth/Sex: Treating RN: 1955-11-18 (66 y.o. Evelyn Campbell Primary Care Makaylyn Sinyard: [name] Other Clinician: Referring Avina Eberle: Treating Alizon Schmeling/Extender: [name] [name]: 4 Encounter Discharge Information Items Discharge Condition: Stable Ambulatory Status: Ambulatory Discharge Destination: Home Transportation: Private Auto Accompanied By: self Schedule Follow-up Appointment: Yes Clinical Summary of Care: Patient Declined Electronic Signature(s) Signed: 10/27/2022 3:06:47 PM By: Sabas Sous By: Adline Peals on 10/27/2022 14:49:43 -------------------------------------------------------------------------------- Lower Extremity Assessment Details Patient Name: Date of Service: [name], RO [facility] 10/27/2022 2:15 PM Medical Record Number: [id number] Patient Account Number: [id number] Date of Birth/Sex: Treating RN: 08-30-1956 (66 y.o. Evelyn Campbell Primary Care Loukisha Gunnerson: [name] Other Clinician: Referring Iysha Mishkin: Treating Evelyn Campbell/Extender: [name] [name]: 4 Edema Assessment Assessed: [Left: No] [Right: No] [Left: Edema] [Right: :] Calf Left: Right: Point of Measurement: From Medial Instep 45.6 cm Ankle Left: Right: Point of Measurement: From Medial Instep 21.5 cm Vascular Assessment Campbell, Evelyn ([id number]) [Right:[id number] Page 4 of 8] Pulses: Dorsalis Pedis Palpable: [Right:Yes] Electronic Signature(s) Signed: 10/27/2022 3:06:47 PM By: Adline Peals Entered By: Adline Peals on 10/27/2022 14:40:18 -------------------------------------------------------------------------------- Multi Wound Chart Details Patient Name: Date of Service: [name], RO [facility] 10/27/2022 2:15 PM Medical Record Number: [id number] Patient Account Number: [id number] Date of Birth/Sex: Treating RN: June 10, 1956  (66 y.o. F) Primary Care Isebella Upshur: [name] Other Clinician: Referring Sasuke Yaffe: Treating Evelyn Campbell/Extender: [name] [name]: 4 Vital Signs Height(in): Pulse(bpm): 66 Weight(lbs): Blood Pressure(mmHg): 122/84 Body Mass Index(BMI): Temperature(F): 98.5 Respiratory Rate(breaths/min): 18 [3:Photos:] [N/A:N/A] Right Foot N/A N/A Wound Location: Gradually Appeared N/A N/A Wounding Event: Trauma, Other N/A N/A Primary Etiology: Lymphedema, Hypertension, N/A N/A Comorbid History: Peripheral Venous Disease, Gout, Rheumatoid Arthritis, Neuropathy 09/21/2022 N/A N/A Date Acquired: 4 N/A N/A Weeks of Treatment: Open N/A N/A Wound Status: No N/A N/A Wound Recurrence:  0x0x0 N/A N/A Measurements L x W x D (cm) 0 N/A N/A A (cm) : rea 0 N/A N/A Volume (cm) : 100.00% N/A N/A % Reduction in Area: 100.00% N/A N/A % Reduction in Volume: Full Thickness Without Exposed N/A N/A Classification: Support Structures None Present N/A N/A Exudate Amount: Flat and Intact N/A N/A Wound Margin: None Present (0%) N/A N/A Granulation Amount: None Present (0%) N/A N/A Necrotic Amount: Fascia: No N/A N/A Exposed Structures: Fat Layer (Subcutaneous Tissue): No Tendon: No Muscle: No Joint: No Bone: No Large (67-100%) N/A N/A Epithelialization: Callus: Yes N/A N/A Periwound Skin Texture: Dry/Scaly: Yes N/A N/A Periwound Skin Moisture: No Abnormalities Noted N/A N/A Periwound Skin Color: No Abnormality N/A N/A Temperature: Treatment Notes Campbell, Evelyn ([id number]) [id number] Page 5 of 8 Wound #3 (Foot) Wound Laterality: Right Cleanser Peri-Wound Care Topical Primary Dressing Secondary Dressing Secured With Compression Wrap Compression Stockings Add-Ons Electronic Signature(s) Signed: 10/27/2022 2:51:55 PM By: [name] MD FACS Entered By: Fredirick Campbell on 10/27/2022  14:51:55 -------------------------------------------------------------------------------- Multi-Disciplinary Care Plan Details Patient Name: Date of Service: [name], RO [facility] 10/27/2022 2:15 PM Medical Record Number: [id number] Patient Account Number: [id number] Date of Birth/Sex: Treating RN: November 11, 1955 (66 y.o. Evelyn Campbell Primary Care Tate Zagal: [name] Other Clinician: Referring Lakela Kuba: Treating Jorden Minchey/Extender: [name] [name]: 4 Active Inactive Electronic Signature(s) Signed: 10/27/2022 3:06:47 PM By: Adline Peals Entered By: Adline Peals on 10/27/2022 14:46:05 -------------------------------------------------------------------------------- Pain Assessment Details Patient Name: Date of Service: [name], [place] 10/27/2022 2:15 PM Medical Record Number: [id number] Patient Account Number: [id number] Date of Birth/Sex: Treating RN: 01-Nov-1956 (66 y.o. Evelyn Campbell Primary Care Reyanna Baley: [name] Other Clinician: Referring Evelyn Campbell: Treating Lark Runk/Extender: [name] [name]: 4 Active Problems Location of Pain Severity and Description of Pain Patient Has Paino No Site Locations Rate the pain. Evelyn, Campbell ([id number]) [id number] Page 6 of 8 Rate the pain. Current Pain Level: 0 Pain Management and Medication Current Pain Management: Electronic Signature(s) Signed: 10/27/2022 3:06:47 PM By: Adline Peals Entered By: Adline Peals on 10/27/2022 14:27:23 -------------------------------------------------------------------------------- Patient/Caregiver Education Details Patient Name: Date of Service: [name], RO [facility] 12/27/2023andnbsp2:15 PM Medical Record Number: [id number] Patient Account Number: [id number] Date of Birth/Gender: Treating RN: [birth year] (66 y.o. Evelyn Campbell Primary Care Physician:  [name] Other Clinician: Referring Physician: Treating Physician/Extender: [name] [name]: 4 Education Assessment Education Provided To: Patient Education Topics Provided Offloading: Methods: Explain/Verbal Responses: Reinforcements needed, State content correctly Electronic Signature(s) Signed: 10/27/2022 3:06:47 PM By: Adline Peals Entered By: Adline Peals on 10/27/2022 14:46:21 -------------------------------------------------------------------------------- Wound Assessment Details Patient Name: Date of Service: [name], RO [facility] 10/27/2022 2:15 PM Medical Record Number: [id number] Patient Account Number: [id number] Date of Birth/Sex: Treating RN: 1955-12-23 (66 y.o. Evelyn Campbell Primary Care Joud Ingwersen: [name] Other Clinician: Referring Evelyn Campbell: Treating Wetona Viramontes/Extender: [name] [place], [place] ([id number]) [phone].pdf Page 7 of 8 [name]: 4 Wound Status Wound Number: 3 Primary Trauma, Other Etiology: Wound Location: Right Foot Wound Open Wounding Event: Gradually Appeared Status: Date Acquired: 09/21/2022 Comorbid Lymphedema, Hypertension, Peripheral Venous Disease, Gout, Weeks Of Treatment: 4 History: Rheumatoid Arthritis, Neuropathy Clustered Wound: No Photos Wound Measurements Length: (cm) Width: (cm) Depth: (cm) Area: (cm) Volume: (cm) 0 % Reduction in Area: 100% 0 % Reduction in Volume: 100% 0 Epithelialization: Large (67-100%) 0 Tunneling: No 0 Undermining: No Wound Description Classification: Full Thickness Without Exposed Suppor  Wound Margin: Flat and Intact Exudate Amount: None Present t Structures Foul Odor After Cleansing: No Slough/Fibrino No Wound Bed Granulation Amount: None Present (0%) Exposed Structure Necrotic Amount: None Present (0%) Fascia Exposed: No Fat Layer (Subcutaneous Tissue)  Exposed: No Tendon Exposed: No Muscle Exposed: No Joint Exposed: No Bone Exposed: No Periwound Skin Texture Texture Color No Abnormalities Noted: Yes No Abnormalities Noted: Yes Moisture Temperature / Pain No Abnormalities Noted: Yes Temperature: No Abnormality Electronic Signature(s) Signed: 10/27/2022 3:06:47 PM By: Adline Peals Entered By: Adline Peals on 10/27/2022 14:42:40 -------------------------------------------------------------------------------- Vitals Details Patient Name: Date of Service: [name], RO [facility] 10/27/2022 2:15 PM Medical Record Number: [id number] Patient Account Number: [id number] Date of Birth/Sex: Treating RN: 1956/08/17 (66 y.o. Evelyn Campbell Primary Care Wilmarie Sparlin: [name] Other Clinician: Referring Vergil Burby: Treating Mesha Schamberger/Extender: [name] [name]: [place], [place] ([id number]) [id number] Page 8 of 8 Vital Signs Time Taken: 14:27 Temperature (F): 98.5 Pulse (bpm): 65 Respiratory Rate (breaths/min): 18 Blood Pressure (mmHg): 122/84 Reference Range: 80 - 120 mg / dl Electronic Signature(s) Signed: 10/27/2022 3:06:47 PM By: Adline Peals Entered By: Adline Peals on 10/27/2022 14:27:16

## 2022-11-24 ENCOUNTER — Encounter (HOSPITAL_BASED_OUTPATIENT_CLINIC_OR_DEPARTMENT_OTHER): Payer: Medicare Other | Attending: General Surgery | Admitting: General Surgery

## 2022-11-24 DIAGNOSIS — K760 Fatty (change of) liver, not elsewhere classified: Secondary | ICD-10-CM | POA: Diagnosis not present

## 2022-11-24 DIAGNOSIS — G609 Hereditary and idiopathic neuropathy, unspecified: Secondary | ICD-10-CM | POA: Diagnosis not present

## 2022-11-24 DIAGNOSIS — I10 Essential (primary) hypertension: Secondary | ICD-10-CM | POA: Insufficient documentation

## 2022-11-24 DIAGNOSIS — L97512 Non-pressure chronic ulcer of other part of right foot with fat layer exposed: Secondary | ICD-10-CM | POA: Diagnosis not present

## 2022-11-24 DIAGNOSIS — M069 Rheumatoid arthritis, unspecified: Secondary | ICD-10-CM | POA: Insufficient documentation

## 2022-11-24 DIAGNOSIS — M25372 Other instability, left ankle: Secondary | ICD-10-CM | POA: Insufficient documentation

## 2022-11-24 DIAGNOSIS — Z6838 Body mass index (BMI) 38.0-38.9, adult: Secondary | ICD-10-CM | POA: Insufficient documentation

## 2022-11-24 DIAGNOSIS — Z9049 Acquired absence of other specified parts of digestive tract: Secondary | ICD-10-CM | POA: Insufficient documentation

## 2022-11-24 DIAGNOSIS — I890 Lymphedema, not elsewhere classified: Secondary | ICD-10-CM | POA: Diagnosis not present

## 2022-11-24 DIAGNOSIS — E8881 Metabolic syndrome: Secondary | ICD-10-CM | POA: Insufficient documentation

## 2022-11-24 DIAGNOSIS — K219 Gastro-esophageal reflux disease without esophagitis: Secondary | ICD-10-CM | POA: Diagnosis not present

## 2022-11-24 DIAGNOSIS — I872 Venous insufficiency (chronic) (peripheral): Secondary | ICD-10-CM | POA: Insufficient documentation

## 2022-11-24 NOTE — Progress Notes (Signed)
ASHAYA, RAFTERY ([id number]) [id number] Page 1 of 7 Visit Report for 11/24/2022 Arrival Information Details Patient Name: Date of Service: Evelyn Campbell ND, Evelyn Campbell [address] [facility] 11/24/2022 3:00 PM Medical Record Number: [id number] Patient Account Number: [id number] Date of Birth/Sex: Treating RN: 16-Nov-1955 (67 y.o. F) Primary Care Marianela Mandrell: [name] Other Clinician: Referring Corin Tilly: Treating Per Beagley/Extender: [name] [name]: 8 Visit Information History Since Last Visit All ordered tests and consults were completed: No Patient Arrived: Ambulatory Added or deleted any medications: No Arrival Time: 15:15 Any new allergies or adverse reactions: No Accompanied By: self Had a fall or experienced change in No Transfer Assistance: None activities of daily living that may affect Patient Identification Verified: Yes risk of falls: Secondary Verification Process Completed: Yes Signs or symptoms of abuse/neglect since last visito No Patient Has Alerts: Yes Hospitalized since last visit: No Patient Alerts: R: 1.19 Implantable device outside of the clinic excluding No cellular tissue based products placed in the center since last visit: Pain Present Now: No Electronic Signature(s) Signed: 11/24/2022 3:58:24 PM By: Worthy Rancher Entered By: Worthy Rancher on 11/24/2022 15:15:28 -------------------------------------------------------------------------------- Encounter Discharge Information Details Patient Name: Date of Service: [name], RO [facility] 11/24/2022 3:00 PM Medical Record Number: [id number] Patient Account Number: [id number] Date of Birth/Sex: Treating RN: [birth year] (67 y.o. Evelyn Campbell Primary Care Aideliz Garmany: [name] Other Clinician: Referring Escher Harr: Treating Durrel Mcnee/Extender: [name] [name]: 8 Encounter Discharge Information Items Post Procedure Vitals Discharge  Condition: Stable Temperature (F): 97.4 Ambulatory Status: Cane Pulse (bpm): 64 Discharge Destination: Home Respiratory Rate (breaths/min): 18 Transportation: Private Auto Blood Pressure (mmHg): 135/77 Accompanied By: self Schedule Follow-up Appointment: Yes Clinical Summary of Care: Patient Declined Electronic Signature(s) Signed: 11/24/2022 5:02:56 PM By: Sharyn Creamer RN, BSN Entered By: Sharyn Creamer on 11/24/2022 16:11:06 Evelyn Campbell ([id number]) [id number] Page 2 of 7 -------------------------------------------------------------------------------- Lower Extremity Assessment Details Patient Name: Date of Service: [name] [facility] 11/24/2022 3:00 PM Medical Record Number: [id number] Patient Account Number: [id number] Date of Birth/Sex: Treating RN: [birth year] (67 y.o. Evelyn Campbell Primary Care Halah Whiteside: [name] Other Clinician: Referring Helios Kohlmann: Treating Rena Sweeden/Extender: [name] [name]: 8 Edema Assessment Assessed: [Left: No] [Right: No] [Left: Edema] [Right: :] Calf Left: Right: Point of Measurement: From Medial Instep 44.5 cm Ankle Left: Right: Point of Measurement: From Medial Instep 22 cm Vascular Assessment Pulses: Dorsalis Pedis Palpable: [Right:Yes] Electronic Signature(s) Signed: 11/24/2022 5:02:56 PM By: Sharyn Creamer RN, BSN Entered By: Sharyn Creamer on 11/24/2022 15:27:53 -------------------------------------------------------------------------------- Multi Wound Chart Details Patient Name: Date of Service: [name], RO [facility] 11/24/2022 3:00 PM Medical Record Number: [id number] Patient Account Number: [id number] Date of Birth/Sex: Treating RN: 1956/08/24 (67 y.o. F) Primary Care Soni Kegel: [name] Other Clinician: Referring Sherri Levenhagen: Treating Resean Brander/Extender: [name] [name]: 8 Vital Signs Height(in): 65 Pulse(bpm):  53 Weight(lbs): 233 Blood Pressure(mmHg): 135/77 Body Mass Index(BMI): 38.8 Temperature(F): 97.4 Respiratory Rate(breaths/min): 20 [4:Photos:] [N/A:N/A] Right, Plantar Foot N/A N/A Wound Location: Gradually Appeared N/A N/A Wounding Event: Pressure Ulcer N/A N/A Primary Etiology: Lymphedema, Hypertension, N/A N/A Comorbid History: Peripheral Venous Disease, Gout, Rheumatoid Arthritis, Neuropathy 11/24/2022 N/A N/A Date Acquired: 0 N/A N/A Weeks of Treatment: Open N/A N/A Wound Status: No N/A N/A Wound Recurrence: 0.5x0.3x0.1 N/A N/A Measurements L x W x D (cm) 0.118 N/A N/A A (cm) : rea 0.012 N/A N/A Volume (cm) : Category/Stage II N/A N/A Classification: Medium N/A N/A Exudate  A mount: Serosanguineous N/A N/A Exudate Type: red, brown N/A N/A Exudate Color: Distinct, outline attached N/A N/A Wound Margin: Large (67-100%) N/A N/A Granulation A mount: Red N/A N/A Granulation Quality: Small (1-33%) N/A N/A Necrotic A mount: Fat Layer (Subcutaneous Tissue): Yes N/A N/A Exposed Structures: Fascia: No Tendon: No Muscle: No Joint: No Bone: No None N/A N/A Epithelialization: Debridement - Selective/Open Wound N/A N/A Debridement: Pre-procedure Verification/Time Out 14:50 N/A N/A Taken: Lidocaine 4% T opical Solution N/A N/A Pain Control: Callus, Slough N/A N/A Tissue Debrided: Non-Viable Tissue N/A N/A Level: 0.15 N/A N/A Debridement A (sq cm): rea Curette N/A N/A Instrument: Minimum N/A N/A Bleeding: Pressure N/A N/A Hemostasis A chieved: 0 N/A N/A Procedural Pain: 0 N/A N/A Post Procedural Pain: Procedure was tolerated well N/A N/A Debridement Treatment Response: 0.5x0.3x0.1 N/A N/A Post Debridement Measurements L x W x D (cm) 0.012 N/A N/A Post Debridement Volume: (cm) Category/Stage II N/A N/A Post Debridement Stage: No Abnormalities Noted N/A N/A Periwound Skin Texture: No Abnormalities Noted N/A N/A Periwound Skin  Moisture: No Abnormalities Noted N/A N/A Periwound Skin Color: Debridement N/A N/A Procedures Performed: Treatment Notes Wound #4 (Foot) Wound Laterality: Plantar, Right Cleanser Soap and Water Discharge Instruction: May shower and wash wound with dial antibacterial soap and water prior to dressing change. Peri-Wound Care Topical Primary Dressing Sorbalgon AG Dressing 2x2 (in/in) Discharge Instruction: Apply to wound bed as instructed Secondary Dressing Woven Gauze Sponge, Non-Sterile 4x4 in Discharge Instruction: Apply over primary dressing as directed. Secured With 65M Medipore H Soft Cloth Surgical T ape, 4 x 10 (in/yd) Discharge Instruction: Secure with tape as directed. Compression Wrap Compression Stockings Add-Ons Campbell, Evelyn ([id number]) [phone].pdf Page 4 of 7 Electronic Signature(s) Signed: 11/24/2022 4:21:07 PM By: [name] MD FACS Entered By: Fredirick Campbell on 11/24/2022 16:21:07 -------------------------------------------------------------------------------- Multi-Disciplinary Care Plan Details Patient Name: Date of Service: [name], Evelyn Campbell 11/24/2022 3:00 PM Medical Record Number: [id number] Patient Account Number: [id number] Date of Birth/Sex: Treating RN: 06-27-1956 (67 y.o. Evelyn Campbell Primary Care Tryston Gilliam: [name] Other Clinician: Referring Kristelle Cavallaro: Treating Waylin Dorko/Extender: [name] [name]: 8 Active Inactive Pressure Nursing Diagnoses: Knowledge deficit related to causes and risk factors for pressure ulcer development Knowledge deficit related to management of pressures ulcers Goals: Patient will remain free from development of additional pressure ulcers Date Initiated: 11/24/2022 Target Resolution Date: 12/30/2022 Goal Status: Active Patient/caregiver will verbalize risk factors for pressure ulcer development Date Initiated: 11/24/2022 Target Resolution  Date: 12/23/2022 Goal Status: Active Interventions: Assess offloading mechanisms upon admission and as needed Provide education on pressure ulcers Notes: Electronic Signature(s) Signed: 11/24/2022 5:02:56 PM By: Sharyn Creamer RN, BSN Entered By: Sharyn Creamer on 11/24/2022 15:54:18 -------------------------------------------------------------------------------- Pain Assessment Details Patient Name: Date of Service: [name], RO [facility] 11/24/2022 3:00 PM Medical Record Number: [id number] Patient Account Number: [id number] Date of Birth/Sex: Treating RN: 09/24/1956 (67 y.o. F) Primary Care Danyale Ridinger: [name] Other Clinician: Referring Ermel [name]: Treating Wiley Magan/Extender: [name] [name]: 8 Active Problems Location of Pain Severity and Description of Pain Patient Has Paino Yes Site Locations Rate the pain. Evelyn Campbell, Evelyn Campbell ([id number]) [id number] Page 5 of 7 Rate the pain. Current Pain Level: 2 Worst Pain Level: 10 Least Pain Level: 0 Tolerable Pain Level: 1 Pain Management and Medication Current Pain Management: Electronic Signature(s) Signed: 11/24/2022 3:58:24 PM By: Worthy Rancher Entered By: Worthy Rancher on 11/24/2022 15:16:41 -------------------------------------------------------------------------------- Patient/Caregiver Education Details Patient Name: Date of Service: [name],  RO [facility] 1/24/2024andnbsp3:00 PM Medical Record Number: [id number] Patient Account Number: [id number] Date of Birth/Gender: Treating RN: 09-17-1956 (67 y.o. Evelyn Campbell Primary Care Physician: [name] Other Clinician: Referring Physician: Treating Physician/Extender: [name] [name]: 8 Education Assessment Education Provided To: Patient Education Topics Provided Offloading: Methods: Explain/Verbal Responses: Reinforcements needed, State content correctly Wound/Skin  Impairment: Methods: Explain/Verbal Responses: Reinforcements needed, State content correctly Electronic Signature(s) Signed: 11/24/2022 5:02:56 PM By: Sharyn Creamer RN, BSN Entered By: Sharyn Creamer on 11/24/2022 16:11:33 -------------------------------------------------------------------------------- Wound Assessment Details Patient Name: Date of Service: [name], RO [facility] 11/24/2022 3:00 PM Evelyn Campbell ([id number]) [id number] Page 6 of 7 Medical Record Number: [id number] Patient Account Number: [id number] Date of Birth/Sex: Treating RN: 1956-06-14 (67 y.o. Evelyn Campbell Primary Care Daniyah Fohl: [name] Other Clinician: Referring Drey Shaff: Treating Shalen Petrak/Extender: [name] [name]: 8 Wound Status Wound Number: 4 Primary Pressure Ulcer Etiology: Wound Location: Right, Plantar Foot Wound Open Wounding Event: Gradually Appeared Status: Date Acquired: 11/24/2022 Comorbid Lymphedema, Hypertension, Peripheral Venous Disease, Gout, Weeks Of Treatment: 0 History: Rheumatoid Arthritis, Neuropathy Clustered Wound: No Photos Wound Measurements Length: (cm) 0.5 Width: (cm) 0.3 Depth: (cm) 0.1 Area: (cm) 0.118 Volume: (cm) 0.012 % Reduction in Area: % Reduction in Volume: Epithelialization: None Tunneling: No Undermining: No Wound Description Classification: Category/Stage II Wound Margin: Distinct, outline attached Exudate Amount: Medium Exudate Type: Serosanguineous Exudate Color: red, brown Foul Odor After Cleansing: No Slough/Fibrino Yes Wound Bed Granulation Amount: Large (67-100%) Exposed Structure Granulation Quality: Red Fascia Exposed: No Necrotic Amount: Small (1-33%) Fat Layer (Subcutaneous Tissue) Exposed: Yes Necrotic Quality: Adherent Slough Tendon Exposed: No Muscle Exposed: No Joint Exposed: No Bone Exposed: No Periwound Skin Texture Texture Color No Abnormalities  Noted: Yes No Abnormalities Noted: Yes Moisture No Abnormalities Noted: Yes Treatment Notes Wound #4 (Foot) Wound Laterality: Plantar, Right Cleanser Soap and Water Discharge Instruction: May shower and wash wound with dial antibacterial soap and water prior to dressing change. Peri-Wound Care Topical Primary Dressing Sorbalgon AG Dressing 2x2 (in/in) Discharge Instruction: Apply to wound bed as instructed Campbell, Evelyn ([id number]) [phone].pdf Page 7 of 7 Secondary Dressing Woven Gauze Sponge, Non-Sterile 4x4 in Discharge Instruction: Apply over primary dressing as directed. Secured With 75M Medipore H Soft Cloth Surgical T ape, 4 x 10 (in/yd) Discharge Instruction: Secure with tape as directed. Compression Wrap Compression Stockings Add-Ons Electronic Signature(s) Signed: 11/24/2022 5:02:56 PM By: Sharyn Creamer RN, BSN Entered By: Sharyn Creamer on 11/24/2022 15:38:32 -------------------------------------------------------------------------------- Edon Details Patient Name: Date of Service: [name], RO [facility] 11/24/2022 3:00 PM Medical Record Number: [id number] Patient Account Number: [id number] Date of Birth/Sex: Treating RN: 04-25-1956 (67 y.o. F) Primary Care Jori Thrall: [name] Other Clinician: Referring Chaze Hruska: Treating Roquel Burgin/Extender: [name] [name]: 8 Vital Signs Time Taken: 03:15 Temperature (F): 97.4 Height (in): 65 Pulse (bpm): 64 Weight (lbs): 233 Respiratory Rate (breaths/min): 20 Body Mass Index (BMI): 38.8 Blood Pressure (mmHg): 135/77 Reference Range: 80 - 120 mg / dl Electronic Signature(s) Signed: 11/24/2022 3:58:24 PM By: Worthy Rancher Entered By: Worthy Rancher on 11/24/2022 15:16:25

## 2022-11-24 NOTE — Progress Notes (Signed)
[name], [name] ([id number]) [id number] Page 1 of 10 Visit Report for 11/24/2022 Chief Complaint Document Details Patient Name: Date of Service: Evelyn Campbell [facility] 11/24/2022 3:00 PM Medical Record Number: [id number] Patient Account Number: [id number] Date of Birth/Sex: Treating RN: 07/18/1956 (67 y.o. F) Primary Care Provider: [name] Other Clinician: Referring Provider: Treating Provider/Extender: [name] [name]: 8 Information Obtained from: Patient Chief Complaint Patient seen for complaints of Non-Healing Wound. 09/27/2022: re-opening of wound R foot Electronic Signature(s) Signed: 11/24/2022 4:22:13 PM By: [name] MD FACS Entered By: [name] on 11/24/2022 16:22:13 -------------------------------------------------------------------------------- Debridement Details Patient Name: Date of Service: Evelyn Campbell, Evelyn Campbell 11/24/2022 3:00 PM Medical Record Number: [id number] Patient Account Number: [id number] Date of Birth/Sex: Treating RN: October 24, 1956 (67 y.o. Evelyn Campbell, Evelyn Campbell Primary Care Provider: [name] Other Clinician: Referring Provider: Treating Provider/Extender: [name] [name]: 8 Debridement Performed for Assessment: Wound #4 Right,Plantar Foot Performed By: Physician [name], MD Debridement Type: Debridement Level of Consciousness (Pre-procedure): Awake and Alert Pre-procedure Verification/Time Out Yes - 14:50 Taken: Start Time: 14:54 Pain Control: Lidocaine 4% T opical Solution T Area Debrided (L x W): otal 0.5 (cm) x 0.3 (cm) = 0.15 (cm) Tissue and other material debrided: Non-Viable, Callus, Slough, Slough Level: Non-Viable Tissue Debridement Description: Selective/Open Wound Instrument: Curette Bleeding: Minimum Hemostasis Achieved: Pressure Procedural Pain: 0 Post Procedural Pain: 0 Response to Treatment: Procedure  was tolerated well Level of Consciousness (Post- Awake and Alert procedure): Post Debridement Measurements of Total Wound Length: (cm) 0.5 Stage: Category/Stage II Width: (cm) 0.3 Depth: (cm) 0.1 Volume: (cm) 0.012 Character of Wound/Ulcer Post Debridement: Improved Evelyn Campbell, Evelyn Campbell ([id number]) [id number] Page 2 of 10 Post Procedure Diagnosis Same as Pre-procedure Notes Scribed for Dr Celine Ahr by Sharyn Creamer, Rn Electronic Signature(s) Signed: 11/24/2022 4:35:03 PM By: [name] MD FACS Signed: 11/24/2022 5:02:56 PM By: Sharyn Creamer RN, BSN Entered By: Sharyn Creamer on 11/24/2022 15:55:45 -------------------------------------------------------------------------------- HPI Details Patient Name: Date of Service: Evelyn Campbell, Evelyn Campbell [facility] 11/24/2022 3:00 PM Medical Record Number: [id number] Patient Account Number: [id number] Date of Birth/Sex: Treating RN: [birth year] (67 y.o. F) Primary Care Provider: [name] Other Clinician: Referring Provider: Treating Provider/Extender: [name] [name]: 8 History of Present Illness HPI Description: ADMISSION 01/21/2022 This is a 67 year old woman presenting with an ulcer on the right fifth metatarsal head. She says that it has been present for about 6 weeks. She is not certain how it developed, but she does have peripheral neuropathy. She also has lymphedema, hypertension, metabolic syndrome/prediabetes, but does not carry an actual diagnosis of diabetes nor is she on any medications for this. 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 ABI in clinic today was 0.8. She has been treated in the past for lymphedema, but has been unable to tolerate compression. She does have lymphedema pumps but does not use these regularly. She had been applying Neosporin and new skin to her wound, but it did not really change much. She was referred here  by her primary care provider for further evaluation and management. 01/28/2022: 1 week follow-up. Apparently there was some miscommunication with home health and they did not come out to help her with her dressing changes. The patient and her husband have been doing them themselves. The wound actually looks quite good, with decreased dimensions and a very nice healthy surface. 02/05/2022: The home health nurse came out during the week and  the patient was happy with the care. The wound is smaller today with a clean base. 02/12/2022: The wound continues to contract and is quite small today. There is a bit of callus buildup, but the wound surface itself is very clean. 02/19/2022: The wound is nearly closed. There is a small amount of callus buildup. No slough or drainage. 02/25/2022: The wound is down to just a sliver. She continues to build up callus. No concern for infection. 03/04/2022: The wound is closed. READMISSION 04/12/2022 Last week, the patient noticed a spot of blood on her stocking where her wound had been. She noted that the wound was open. She began applying the Prisma silver collagen that we had used during her last admission. She wrapped it up and on Friday when she changed the dressing, she noticed that there was a new wound on the dorsal part of her right foot overlying the fifth metatarsal. Today it is quite red and tender. There is slough on the surface. The wound on the plantar surface has periwound callus accumulation but otherwise appears fairly clean. 04/19/2022: The culture that we took last week somehow has been lost and we have no results. The patient has been applying topical gentamicin and silver alginate to her wound, however and both of them looks smaller. The redness and tenderness has improved significantly. Her foot remains macerated in appearance, however. She had some bleeding from the plantar wound. On inspection, it looks like secondary to moisture, the surrounding skin  and callus have lifted. When I probed with a thin cotton applicator, this created some minor bleeding and I think this is likely what happened. The tissue is just quite friable. 04/22/2022: The culture data were finally found and she did grow Proteus mirabilis from her wound. I prescribed Bactrim for her. She is here today for her first cast change. Both wound sites look cleaner and smaller today even after just a few days of casting. 04/26/2022: Both wounds are smaller and very clean. She is completing a course of Bactrim for Proteus mirabilis grown from her wound. 04/29/2022: The patient contacted our office yesterday concerned that the cast was pushing too hard against her great toe. She is here today to have the cast removed and a new one reapplied. The wound on the dorsal lateral part of her foot is closed. The wound on the plantar surface of the fifth metatarsal head is smaller and epithelializing. 05/06/2022: Her wounds are closed. READMISSION 09/27/2022 She returns with reopening of her wound on the right fifth metatarsal area after she healed in July, she was seen by Dr. Milinda [facility] at [facility]. She was apparently wearing accommodative orthotics that were in good condition and he recommended that she continue to wear those. He also identified that her shoes seemed narrow and that she should consider a wider shoe. We had recommended that she wear a callus pad or a foam donut of sorts to protect the area but she has not been doing this. She is wearing wide shoes but despite this, over the past week, she says that she opened up the wound and the exact same Pheasant, Mikaelah ([id number]) [phone].pdf Page 3 of 10 location as before. On inspection, it appears that she probably had developed a large callus that ultimately broke down deep to the surface. The tissue is macerated and the fat layer is exposed. There is no purulent drainage or malodor. No visible signs of  infection. 10/04/2022: Even after just a week, there has been  a substantial improvement in her wound. There has been epithelialization such that it has split into 2 separate openings. There is some slough accumulation on the surface but the periwound is in better condition. 12/11; considerable improvement in the plantar foot wound. We replaced the total contact cast. The patient tells me she went to see podiatry for custom orthotics but she did not use them but now thinks she will spend the money to get them. I am not exactly sure where her podiatrist [Dr. Hyatt] practices but will she has an appointment in January. 10/20/2022: The wound is down to just a narrow superficial opening on the plantar surface of her foot. 10/27/2022: Her wound is healed. 11/24/2022: Unfortunately, her wound reopened. 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 The wound is small and clean with just a small amount of slough on the surface. It is located on the plantar surface of her fifth metatarsal head. Electronic Signature(s) Signed: 11/24/2022 4:23:48 PM By: [name] MD FACS Entered By: [name] on 11/24/2022 16:23:48 -------------------------------------------------------------------------------- Physical Exam Details Patient Name: Date of Service: Evelyn Campbell, [place] 11/24/2022 3:00 PM Medical Record Number: [id number] Patient Account Number: [id number] Date of Birth/Sex: Treating RN: 08-26-1956 (66 y.o. F) Primary Care Provider: [name] Other Clinician: Referring Provider: Treating Provider/Extender: [name] [name]: 8 Constitutional . . . . no acute distress. Respiratory Normal work of breathing on room air. Notes The wound is small and clean with just a  small amount of slough on the surface. It is located on the plantar surface of her fifth metatarsal head. Electronic Signature(s) Signed: 11/24/2022 4:25:16 PM By: [name] MD FACS Entered By: [name] on 11/24/2022 16:25:16 -------------------------------------------------------------------------------- Physician Orders Details Patient Name: Date of Service: Evelyn Campbell, [place] 11/24/2022 3:00 PM Medical Record Number: [id number] Patient Account Number: [id number] Date of Birth/Sex: Treating RN: Aug 01, 1956 (67 y.o. Evelyn Campbell Primary Care Provider: [name] Other Clinician: Referring Provider: Treating Provider/Extender: [name] [name]: 8 Verbal / Phone Orders: No Diagnosis Coding ICD-10 Coding Code Description Evelyn Campbell, Evelyn Campbell ([id number]) [id number] Page 4 of 10 L97.512 Non-pressure chronic ulcer of other part of right foot with fat layer exposed I87.2 Venous insufficiency (chronic) (peripheral) G62.9 Polyneuropathy, unspecified E66.01 Morbid (severe) obesity due to excess calories Follow-up Appointments ppointment in 1 week. - Dr. Celine Ahr - room 3 Return A Anesthetic (In clinic) Topical Lidocaine 4% applied to wound bed Bathing/ Shower/ Hygiene May shower and wash wound with soap and water. Edema Control - Lymphedema / SCD / Other Avoid standing for long periods of time. Patient to wear own compression stockings every day. Exercise regularly Wound Treatment Wound #4 - Foot Wound Laterality: Plantar, Right Cleanser: Soap and Water 1 x Per TZG/01 Days Discharge Instructions: May shower and wash wound with dial antibacterial soap and water prior to dressing change. Prim Dressing: Sorbalgon [employer] Dressing 2x2 (in/in) 1 x Per Day/15 Days ary Discharge Instructions: Apply to wound bed as instructed Secondary Dressing: Woven Gauze Sponge, Non-Sterile 4x4 in 1 x Per Day/15 Days Discharge  Instructions: Apply over primary dressing as directed. Secured With: 15M Medipore H Soft Cloth Surgical T ape, 4 x 10 (in/yd) 1 x Per Day/15 Days Discharge Instructions: Secure with tape as  directed. Patient Medications llergies: celecoxib, ibuprofen, meloxicam, Lipitor, amoxicillin, penicillin, Statins-HMG-CoA Reductase Inhibitors A Notifications Medication Indication Start End prior to debridement 11/24/2022 lidocaine DOSE topical 4 % cream - cream topical once daily Electronic Signature(s) Signed: 11/24/2022 4:35:03 PM By: [name] MD FACS Entered By: [name] on 11/24/2022 16:26:41 -------------------------------------------------------------------------------- Problem List Details Patient Name: Date of Service: Evelyn Campbell, [place] 11/24/2022 3:00 PM Medical Record Number: [id number] Patient Account Number: [id number] Date of Birth/Sex: Treating RN: October 09, 1956 (67 y.o. Evelyn Campbell Primary Care Provider: [name] Other Clinician: Referring Provider: Treating Provider/Extender: [name] [name]: 8 Active Problems ICD-10 Encounter Code Description Active Date MDM Diagnosis L97.512 Non-pressure chronic ulcer of other part of right foot with fat layer exposed 09/27/2022 No Yes Arntz, Katilin ([id number]) [id number].pdf Page 5 of 10 I87.2 Venous insufficiency (chronic) (peripheral) 09/27/2022 No Yes G62.9 Polyneuropathy, unspecified 09/27/2022 No Yes E66.01 Morbid (severe) obesity due to excess calories 09/27/2022 No Yes Inactive Problems Resolved Problems Electronic Signature(s) Signed: 11/24/2022 4:20:37 PM By: [name] MD FACS Entered By: [name] on 11/24/2022 16:20:37 -------------------------------------------------------------------------------- Progress Note Details Patient Name: Date of Service: Evelyn Campbell, Evelyn Campbell [facility] 11/24/2022 3:00 PM Medical Record Number:  [id number] Patient Account Number: [id number] Date of Birth/Sex: Treating RN: July 06, 1956 (67 y.o. F) Primary Care Provider: [name] Other Clinician: Referring Provider: Treating Provider/Extender: [name] [name]: 8 Subjective Chief Complaint Information obtained from Patient Patient seen for complaints of Non-Healing Wound. 09/27/2022: re-opening of wound R foot History of Present Illness (HPI) ADMISSION 01/21/2022 This is a 67 year old woman presenting with an ulcer on the right fifth metatarsal head. She says that it has been present for about 6 weeks. She is not certain how it developed, but she does have peripheral neuropathy. She also has lymphedema, hypertension, metabolic syndrome/prediabetes, but does not carry an actual diagnosis of diabetes nor is she on any medications for this. 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 ABI in clinic today was 0.8. She has been treated in the past for lymphedema, but has been unable to tolerate compression. She does have lymphedema pumps but does not use these regularly. She had been applying Neosporin and new skin to her wound, but it did not really change much. She was referred here by her primary care provider for further evaluation and management. 01/28/2022: 1 week follow-up. Apparently there was some miscommunication with home health and they did not come out to help her with her dressing changes. The patient and her husband have been doing them themselves. The wound actually looks quite good, with decreased dimensions and a very nice healthy surface. 02/05/2022: The home health nurse came out during the week and the patient was happy with the care. The wound is smaller today with a clean base. 02/12/2022: The wound continues to contract and is quite small today. There is a bit of callus buildup, but the wound surface itself is very clean. 02/19/2022: The wound  is nearly closed. There is a small amount of callus buildup. No slough or drainage. 02/25/2022: The wound is down to just a sliver. She continues to build up callus. No concern for infection. 03/04/2022: The wound is closed. READMISSION 04/12/2022 Last week, the patient noticed a spot of blood on her stocking where her wound had been. She noted that the wound was open. She began applying the Prisma silver collagen that we had used during her last admission. She  wrapped it up and on Friday when she changed the dressing, she noticed that there was a new wound on the dorsal part of her right foot overlying the fifth metatarsal. Today it is quite red and tender. There is slough on the surface. The wound on the plantar surface has periwound callus accumulation but otherwise appears fairly clean. 04/19/2022: The culture that we took last week somehow has been lost and we have no results. The patient has been applying topical gentamicin and silver alginate to her wound, however and both of them looks smaller. The redness and tenderness has improved significantly. Her foot remains macerated in appearance, however. She had some bleeding from the plantar wound. On inspection, it looks like secondary to moisture, the surrounding skin and callus have lifted. When I probed with a thin cotton applicator, this created some minor bleeding and I think this is likely what happened. The tissue is just quite friable. 04/22/2022: The culture data were finally found and she did grow Proteus mirabilis from her wound. I prescribed Bactrim for her. She is here today for her first Evelyn Campbell, Evelyn Campbell ([id number]) [id number] Page 6 of 10 cast change. Both wound sites look cleaner and smaller today even after just a few days of casting. 04/26/2022: Both wounds are smaller and very clean. She is completing a course of Bactrim for Proteus mirabilis grown from her wound. 04/29/2022: The patient contacted our  office yesterday concerned that the cast was pushing too hard against her great toe. She is here today to have the cast removed and a new one reapplied. The wound on the dorsal lateral part of her foot is closed. The wound on the plantar surface of the fifth metatarsal head is smaller and epithelializing. 05/06/2022: Her wounds are closed. READMISSION 09/27/2022 She returns with reopening of her wound on the right fifth metatarsal area after she healed in July, she was seen by Dr. Milinda Pointer at [facility]. She was apparently wearing accommodative orthotics that were in good condition and he recommended that she continue to wear those. He also identified that her shoes seemed narrow and that she should consider a wider shoe. We had recommended that she wear a callus pad or a foam donut of sorts to protect the area but she has not been doing this. She is wearing wide shoes but despite this, over the past week, she says that she opened up the wound and the exact same location as before. On inspection, it appears that she probably had developed a large callus that ultimately broke down deep to the surface. The tissue is macerated and the fat layer is exposed. There is no purulent drainage or malodor. No visible signs of infection. 10/04/2022: Even after just a week, there has been a substantial improvement in her wound. There has been epithelialization such that it has split into 2 separate openings. There is some slough accumulation on the surface but the periwound is in better condition. 12/11; considerable improvement in the plantar foot wound. We replaced the total contact cast. The patient tells me she went to see podiatry for custom orthotics but she did not use them but now thinks she will spend the money to get them. I am not exactly sure where her podiatrist [Dr. Hyatt] practices but will she has an appointment in January. 10/20/2022: The wound is down to just a narrow superficial opening on  the plantar surface of her foot. 10/27/2022: Her wound is healed. 11/24/2022: Unfortunately, her wound reopened. 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 The wound is small and clean with just a small amount of slough on the surface. It is located on the plantar surface of her fifth metatarsal head. Patient History Information obtained from Patient. Family History Heart Disease - Father, No family history of Cancer, Diabetes, Hereditary Spherocytosis, Hypertension, Kidney Disease, Lung Disease, Seizures, Stroke, Thyroid Problems, Tuberculosis. Social History Never smoker, Marital Status - Married, Alcohol Use - Never, Drug Use - No History, Caffeine Use - Never. Medical History Hematologic/Lymphatic Patient has history of Lymphedema Cardiovascular Patient has history of Hypertension, Peripheral Venous Disease Musculoskeletal Patient has history of Gout, Rheumatoid Arthritis - ankle Neurologic Patient has history of Neuropathy - Bilateral feet Hospitalization/Surgery History - Hernia repair (2016);Right ankle reconstruction. - insertion of mesh. - pancreatectomy. - toe surgery bilateral. - cholecystectomy. - abdominal hysterectomy. - tonsillectomy. - wrist surgery. Medical A Surgical History Notes Campbell Constitutional Symptoms (General Health) obesity Hematologic/Lymphatic Gout, vitamin D deficiency Cardiovascular PEriperal venous insufficiency Gastrointestinal Diverticular disease of colon: GERD, fatty liver Endocrine Prediabetes Neurologic Idiopathic peripheral neuropathy Objective Constitutional no acute distress. Evelyn Campbell, Evelyn Campbell ([id number]) [id number] Page 7 of 10 Vitals Time Taken: 3:15 AM, Height: 65 in, Weight: 233 lbs, BMI: 38.8, Temperature: 97.4  F, Pulse: 64 bpm, Respiratory Rate: 20 breaths/min, Blood Pressure: 135/77 mmHg. Respiratory Normal work of breathing on room air. General Notes: The wound is small and clean with just a small amount of slough on the surface. It is located on the plantar surface of her fifth metatarsal head. Integumentary (Hair, Skin) Wound #4 status is Open. Original cause of wound was Gradually Appeared. The date acquired was: 11/24/2022. The wound is located on the [place]. The wound measures 0.5cm length x 0.3cm width x 0.1cm depth; 0.118cm^2 area and 0.012cm^3 volume. There is Fat Layer (Subcutaneous Tissue) exposed. There is no tunneling or undermining noted. There is a medium amount of serosanguineous drainage noted. The wound margin is distinct with the outline attached to the wound base. There is large (67-100%) red granulation within the wound bed. There is a small (1-33%) amount of necrotic tissue within the wound bed including Adherent Slough. The periwound skin appearance had no abnormalities noted for texture. The periwound skin appearance had no abnormalities noted for moisture. The periwound skin appearance had no abnormalities noted for color. Assessment Active Problems ICD-10 Non-pressure chronic ulcer of other part of right foot with fat layer exposed Venous insufficiency (chronic) (peripheral) Polyneuropathy, unspecified Morbid (severe) obesity due to excess calories Procedures Wound #4 Pre-procedure diagnosis of Wound #4 is a Pressure Ulcer located on the Right,Plantar Foot . There was a Selective/Open Wound Non-Viable Tissue Debridement with a total area of 0.15 sq cm performed by [name], MD. With the following instrument(s): Curette to remove Non-Viable tissue/material. Material removed includes Callus and Slough and after achieving pain control using Lidocaine 4% Topical Solution. No specimens were taken. A time out was conducted at 14:50, prior to the start of  the procedure. A Minimum amount of bleeding was controlled with Pressure. The procedure was tolerated well with a pain level of 0 throughout and a pain level of 0 following the procedure. Post Debridement Measurements: 0.5cm length x 0.3cm width x 0.1cm depth; 0.012cm^3 volume. Post debridement Stage noted as Category/Stage II. Character of Wound/Ulcer Post Debridement  is improved. Post procedure Diagnosis Wound #4: Same as Pre-Procedure General Notes: Scribed for Dr Celine Ahr by Sharyn Creamer, Rn. Plan Follow-up Appointments: Return Appointment in 1 week. - Dr. Celine Ahr - room 3 Anesthetic: (In clinic) Topical Lidocaine 4% applied to wound bed Bathing/ Shower/ Hygiene: May shower and wash wound with soap and water. Edema Control - Lymphedema / SCD / Other: Avoid standing for long periods of time. Patient to wear own compression stockings every day. Exercise regularly The following medication(s) was prescribed: lidocaine topical 4 % cream cream topical once daily for prior to debridement was prescribed at facility WOUND #4: - Foot Wound Laterality: Plantar, Right Cleanser: Soap and Water 1 x Per Day/15 Days Discharge Instructions: May shower and wash wound with dial antibacterial soap and water prior to dressing change. Prim Dressing: Sorbalgon AG Dressing 2x2 (in/in) 1 x Per Day/15 Days ary Discharge Instructions: Apply to wound bed as instructed Secondary Dressing: Woven Gauze Sponge, Non-Sterile 4x4 in 1 x Per Day/15 Days Discharge Instructions: Apply over primary dressing as directed. Secured With: 14M Medipore H Soft Cloth Surgical T ape, 4 x 10 (in/yd) 1 x Per Day/15 Days Discharge Instructions: Secure with tape as directed. 11/24/2022: Unfortunately, she has managed to reopen her wound again. The wound is small and clean with just a small amount of slough on the surface. It is located on the plantar surface of her fifth metatarsal head. I used a curette to debride slough from the wound  surface. Unfortunately, she has to drive herself today and so we cannot apply a total contact cast. We will use silver alginate and construct a thick donut to better offload the area. When she returns for her visit next week, we will plan for total contact casting. Evelyn Campbell, Evelyn Campbell ([id number]) [id number] Page 8 of 10 Electronic Signature(s) Signed: 11/24/2022 4:27:48 PM By: [name] MD FACS Entered By: [name] on 11/24/2022 16:27:48 -------------------------------------------------------------------------------- HxROS Details Patient Name: Date of Service: Evelyn Campbell, [place] 11/24/2022 3:00 PM Medical Record Number: [id number] Patient Account Number: [id number] Date of Birth/Sex: Treating RN: 30-Jul-1956 (67 y.o. F) Primary Care Provider: [name] Other Clinician: Referring Provider: Treating Provider/Extender: [name] [name]: 8 Information Obtained From Patient Constitutional Symptoms (General Health) Medical History: Past Medical History Notes: obesity Hematologic/Lymphatic Medical History: Positive for: Lymphedema Past Medical History Notes: Gout, vitamin D deficiency Cardiovascular Medical History: Positive for: Hypertension; Peripheral Venous Disease Past Medical History Notes: PEriperal venous insufficiency Gastrointestinal Medical History: Past Medical History Notes: Diverticular disease of colon: GERD, fatty liver Endocrine Medical History: Past Medical History Notes: Prediabetes Musculoskeletal Medical History: Positive for: Gout; Rheumatoid Arthritis - ankle Neurologic Medical History: Positive for: Neuropathy - Bilateral feet Past Medical History Notes: Idiopathic peripheral neuropathy Immunizations Pneumococcal Vaccine: Received Pneumococcal Vaccination: Yes Received Pneumococcal Vaccination On or After 60th Birthday: Yes Implantable Devices None Evelyn Campbell, Evelyn Campbell  ([id number]) [phone].pdf Page 9 of 10 Hospitalization / Surgery History Type of Hospitalization/Surgery Hernia repair (2016);Right ankle reconstruction insertion of mesh pancreatectomy toe surgery bilateral cholecystectomy abdominal hysterectomy tonsillectomy wrist surgery Family and Social History Cancer: No; Diabetes: No; Heart Disease: Yes - Father; Hereditary Spherocytosis: No; Hypertension: No; Kidney Disease: No; Lung Disease: No; Seizures: No; Stroke: No; Thyroid Problems: No; Tuberculosis: No; Never smoker; Marital Status - Married; Alcohol Use: Never; Drug Use: No History; Caffeine Use: Never; Financial Concerns: No; Food, Clothing or Shelter Needs: No; Support System Lacking: No; Transportation Concerns: No Electronic Signature(s) Signed: 11/24/2022 4:35:03 PM By: [name]  MD FACS Entered By: [name] on 11/24/2022 16:23:53 -------------------------------------------------------------------------------- SuperBill Details Patient Name: Date of Service: Evelyn Campbell, [place] 11/24/2022 Medical Record Number: [id number] Patient Account Number: [id number] Date of Birth/Sex: Treating RN: Jun 09, 1956 (67 y.o. F) Primary Care Provider: [name] Other Clinician: Referring Provider: Treating Provider/Extender: [name] [name]: 8 Diagnosis Coding ICD-10 Codes Code Description [phone] Non-pressure chronic ulcer of other part of right foot with fat layer exposed I87.2 Venous insufficiency (chronic) (peripheral) G62.9 Polyneuropathy, unspecified E66.01 Morbid (severe) obesity due to excess calories Facility Procedures : CPT4 Code: [id number] Description: 37858 - DEBRIDE WOUND 1ST 20 SQ CM OR < ICD-10 Diagnosis Description L97.512 Non-pressure chronic ulcer of other part of right foot with fat layer exposed Modifier: Quantity: 1 Physician Procedures : CPT4 Code Description Modifier [id number] 12878 - WC  PHYS LEVEL 3 - EST PT 25 ICD-10 Diagnosis Description L97.512 Non-pressure chronic ulcer of other part of right foot with fat layer exposed I87.2 Venous insufficiency (chronic) (peripheral) G62.9  Polyneuropathy, unspecified E66.01 Morbid (severe) obesity due to excess calories Quantity: 1 : [id number] [id number] - WC PHYS DEBR WO ANESTH 20 SQ CM ICD-10 Diagnosis Description L97.512 Non-pressure chronic ulcer of other part of right foot with fat layer exposed Quantity: 1 Electronic Signature(s) Signed: 11/24/2022 4:28:09 PM By: [name] MD [place], Evelyn Campbell ([id number]) [id number] Page 10 of 10 Entered By: [name] on 11/24/2022 16:28:08

## 2022-11-25 ENCOUNTER — Ambulatory Visit: Payer: Medicare Other | Admitting: Podiatry

## 2022-12-02 ENCOUNTER — Encounter (HOSPITAL_BASED_OUTPATIENT_CLINIC_OR_DEPARTMENT_OTHER): Payer: Medicare Other | Attending: General Surgery | Admitting: General Surgery

## 2022-12-02 DIAGNOSIS — Z872 Personal history of diseases of the skin and subcutaneous tissue: Secondary | ICD-10-CM | POA: Diagnosis not present

## 2022-12-02 DIAGNOSIS — Z6838 Body mass index (BMI) 38.0-38.9, adult: Secondary | ICD-10-CM | POA: Insufficient documentation

## 2022-12-02 DIAGNOSIS — Z09 Encounter for follow-up examination after completed treatment for conditions other than malignant neoplasm: Secondary | ICD-10-CM | POA: Diagnosis present

## 2022-12-02 DIAGNOSIS — I872 Venous insufficiency (chronic) (peripheral): Secondary | ICD-10-CM | POA: Diagnosis not present

## 2022-12-02 DIAGNOSIS — G629 Polyneuropathy, unspecified: Secondary | ICD-10-CM | POA: Insufficient documentation

## 2022-12-02 NOTE — Progress Notes (Signed)
YURITZA, PAULHUS ([id number]) [id number] Page 1 of 8 Visit Report for 12/02/2022 Arrival Information Details Patient Name: Date of Service: Evelyn Campbell [facility] 12/02/2022 1:15 PM Medical Record Number: [id number] Patient Account Number: [id number] Date of Birth/Sex: Treating RN: Jan 09, 1956 (67 y.o. Evelyn Campbell Primary Care Dorlis Judice: [name] Other Clinician: Referring Synia Douglass: Treating Aminata Buffalo/Extender: [name] [name]: 9 Visit Information History Since Last Visit Added or deleted any medications: No Patient Arrived: Evelyn Campbell Any new allergies or adverse reactions: No Arrival Time: 13:12 Had a fall or experienced change in No Accompanied By: self activities of daily living that may affect Transfer Assistance: None risk of falls: Patient Identification Verified: Yes Signs or symptoms of abuse/neglect since last visito No Secondary Verification Process Completed: Yes Hospitalized since last visit: No Patient Has Alerts: Yes Implantable device outside of the clinic excluding No Patient Alerts: R: 1.19 cellular tissue based products placed in the center since last visit: Has Dressing in Place as Prescribed: Yes Pain Present Now: No Electronic Signature(s) Signed: 12/02/2022 4:55:33 PM By: Baruch Gouty RN, BSN Entered By: Baruch Gouty on 12/02/2022 13:24:17 -------------------------------------------------------------------------------- Clinic Level of Care Assessment Details Patient Name: Date of Service: [name], RO [facility] 12/02/2022 1:15 PM Medical Record Number: [id number] Patient Account Number: [id number] Date of Birth/Sex: Treating RN: [birth year] (67 y.o. [name], Vaughan Basta Primary Care Breyton Vanscyoc: [name] Other Clinician: Referring Omri Bertran: Treating Romina Divirgilio/Extender: [name] [name]: 9 Clinic Level of Care Assessment Items TOOL 4 Quantity Score '[]'$  -  0 Use when only an EandM is performed on FOLLOW-UP visit ASSESSMENTS - Nursing Assessment / Reassessment X- 1 10 Reassessment of Co-morbidities (includes updates in patient status) X- 1 5 Reassessment of Adherence to Treatment Plan ASSESSMENTS - Wound and Skin A ssessment / Reassessment X - Simple Wound Assessment / Reassessment - one wound 1 5 '[]'$  - 0 Complex Wound Assessment / Reassessment - multiple wounds '[]'$  - 0 Dermatologic / Skin Assessment (not related to wound area) ASSESSMENTS - Focused Assessment '[]'$  - 0 Circumferential Edema Measurements - multi extremities '[]'$  - 0 Nutritional Assessment / Counseling / Intervention ADELEE, HANNULA ([id number]) [id number] Page 2 of 8 X- 1 5 Lower Extremity Assessment (monofilament, tuning fork, pulses) '[]'$  - 0 Peripheral Arterial Disease Assessment (using hand held doppler) ASSESSMENTS - Ostomy and/or Continence Assessment and Care '[]'$  - 0 Incontinence Assessment and Management '[]'$  - 0 Ostomy Care Assessment and Management (repouching, etc.) PROCESS - Coordination of Care X - Simple Patient / Family Education for ongoing care 1 15 '[]'$  - 0 Complex (extensive) Patient / Family Education for ongoing care X- 1 10 Staff obtains Programmer, systems, Records, T Results / Process Orders est '[]'$  - 0 Staff telephones HHA, Nursing Homes / Clarify orders / etc '[]'$  - 0 Routine Transfer to another Facility (non-emergent condition) '[]'$  - 0 Routine Hospital Admission (non-emergent condition) '[]'$  - 0 New Admissions / Biomedical engineer / Ordering NPWT Apligraf, etc. , '[]'$  - 0 Emergency Hospital Admission (emergent condition) X- 1 10 Simple Discharge Coordination '[]'$  - 0 Complex (extensive) Discharge Coordination PROCESS - Special Needs '[]'$  - 0 Pediatric / Minor Patient Management '[]'$  - 0 Isolation Patient Management '[]'$  - 0 Hearing / Language / Visual special needs '[]'$  - 0 Assessment of Community assistance (transportation, D/C  planning, etc.) '[]'$  - 0 Additional assistance / Altered mentation '[]'$  - 0 Support Surface(s) Assessment (bed, cushion, seat, etc.) INTERVENTIONS - Wound Cleansing / Measurement X - Simple Wound  Cleansing - one wound 1 5 '[]'$  - 0 Complex Wound Cleansing - multiple wounds X- 1 5 Wound Imaging (photographs - any number of wounds) '[]'$  - 0 Wound Tracing (instead of photographs) '[]'$  - 0 Simple Wound Measurement - one wound '[]'$  - 0 Complex Wound Measurement - multiple wounds INTERVENTIONS - Wound Dressings X - Small Wound Dressing one or multiple wounds 1 10 '[]'$  - 0 Medium Wound Dressing one or multiple wounds '[]'$  - 0 Large Wound Dressing one or multiple wounds '[]'$  - 0 Application of Medications - topical '[]'$  - 0 Application of Medications - injection INTERVENTIONS - Miscellaneous '[]'$  - 0 External ear exam '[]'$  - 0 Specimen Collection (cultures, biopsies, blood, body fluids, etc.) '[]'$  - 0 Specimen(s) / Culture(s) sent or taken to Lab for analysis '[]'$  - 0 Patient Transfer (multiple staff / Civil Service fast streamer / Similar devices) '[]'$  - 0 Simple Staple / Suture removal (25 or less) '[]'$  - 0 Complex Staple / Suture removal (26 or more) '[]'$  - 0 Hypo / Hyperglycemic Management (close monitor of Blood Glucose) Evelyn Campbell, Evelyn Campbell ([id number]) [id number] Page 3 of 8 '[]'$  - 0 Ankle / Brachial Index (ABI) - do not check if billed separately X- 1 5 Vital Signs Has the patient been seen at the hospital within the last three years: Yes Total Score: 85 Level Of Care: New/Established - Level 3 Electronic Signature(s) Signed: 12/02/2022 4:55:33 PM By: Baruch Gouty RN, BSN Entered By: Baruch Gouty on 12/02/2022 13:50:54 -------------------------------------------------------------------------------- Encounter Discharge Information Details Patient Name: Date of Service: [name], RO [facility] 12/02/2022 1:15 PM Medical Record Number: [id number] Patient Account Number: [id number] Date of  Birth/Sex: Treating RN: [birth year] (67 y.o. Evelyn Campbell Primary Care Jahnavi Muratore: [name] Other Clinician: Referring Irie Fiorello: Treating Jyoti Harju/Extender: [name] [name]: 9 Encounter Discharge Information Items Discharge Condition: Stable Ambulatory Status: Cane Discharge Destination: Home Transportation: Private Auto Accompanied By: self Schedule Follow-up Appointment: Yes Clinical Summary of Care: Patient Declined Electronic Signature(s) Signed: 12/02/2022 4:55:33 PM By: Baruch Gouty RN, BSN Entered By: Baruch Gouty on 12/02/2022 13:52:51 -------------------------------------------------------------------------------- Lower Extremity Assessment Details Patient Name: Date of Service: [name], RO [facility] 12/02/2022 1:15 PM Medical Record Number: [id number] Patient Account Number: [id number] Date of Birth/Sex: Treating RN: 1956/05/08 (67 y.o. Evelyn Campbell Primary Care Valine Drozdowski: [name] Other Clinician: Referring Sahiba Granholm: Treating Masaji Billups/Extender: [name] [name]: 9 Edema Assessment Assessed: [Left: No] [Right: No] Edema: [Left: N] [Right: o] Calf Left: Right: Point of Measurement: From Medial Instep 44.5 cm Ankle Left: Right: Point of Measurement: From Medial Instep 22 cm Vascular Assessment Druckenmiller, Evelyn Campbell ([id number]) [Right:[id number] Page 4 of 8] Pulses: Dorsalis Pedis Palpable: [Right:Yes] Electronic Signature(s) Signed: 12/02/2022 4:55:33 PM By: Baruch Gouty RN, BSN Entered By: Baruch Gouty on 12/02/2022 13:28:57 -------------------------------------------------------------------------------- Multi Wound Chart Details Patient Name: Date of Service: [name], RO [facility] 12/02/2022 1:15 PM Medical Record Number: [id number] Patient Account Number: [id number] Date of Birth/Sex: Treating RN: 07-03-1956 (67 y.o. F) Primary Care Keiyana Stehr:  [name] Other Clinician: Referring Chibuike Fleek: Treating Iantha Titsworth/Extender: [name] [name]: 9 Vital Signs Height(in): 65 Pulse(bpm): 60 Weight(lbs): 233 Blood Pressure(mmHg): 133/84 Body Mass Index(BMI): 38.8 Temperature(F): 98.1 Respiratory Rate(breaths/min): 18 [4:Photos:] [N/A:N/A] Right, Plantar Foot N/A N/A Wound Location: Gradually Appeared N/A N/A Wounding Event: Pressure Ulcer N/A N/A Primary Etiology: Lymphedema, Hypertension, N/A N/A Comorbid History: Peripheral Venous Disease, Gout, Rheumatoid Arthritis, Neuropathy 11/24/2022 N/A N/A Date Acquired: 1 N/A N/A  Weeks of Treatment: Open N/A N/A Wound Status: No N/A N/A Wound Recurrence: 0x0x0 N/A N/A Measurements L x W x D (cm) 0 N/A N/A A (cm) : rea 0 N/A N/A Volume (cm) : 100.00% N/A N/A % Reduction in A rea: 100.00% N/A N/A % Reduction in Volume: Category/Stage II N/A N/A Classification: None Present N/A N/A Exudate A mount: None Present (0%) N/A N/A Granulation A mount: None Present (0%) N/A N/A Necrotic A mount: Fascia: No N/A N/A Exposed Structures: Fat Layer (Subcutaneous Tissue): No Tendon: No Muscle: No Joint: No Bone: No Large (67-100%) N/A N/A Epithelialization: No Abnormalities Noted N/A N/A Periwound Skin Texture: No Abnormalities Noted N/A N/A Periwound Skin Moisture: No Abnormalities Noted N/A N/A Periwound Skin Color: No Abnormality N/A N/A Temperature: Treatment Notes Evelyn Campbell, Evelyn Campbell ([id number]) [id number] Page 5 of 8 Electronic Signature(s) Signed: 12/02/2022 1:51:06 PM By: [name] MD FACS Entered By: [name] on 12/02/2022 13:51:06 -------------------------------------------------------------------------------- Multi-Disciplinary Care Plan Details Patient Name: Date of Service: [name], RO [facility] 12/02/2022 1:15 PM Medical Record Number: [id number] Patient Account Number:  [id number] Date of Birth/Sex: Treating RN: 1956-04-30 (67 y.o. Evelyn Campbell Primary Care Venetta Knee: [name] Other Clinician: Referring Mckinleigh Schuchart: Treating Cejay Cambre/Extender: [name] [name]: 9 Active Inactive Electronic Signature(s) Signed: 12/02/2022 4:55:33 PM By: Baruch Gouty RN, BSN Entered By: Baruch Gouty on 12/02/2022 13:50:04 -------------------------------------------------------------------------------- Pain Assessment Details Patient Name: Date of Service: [name], [place] 12/02/2022 1:15 PM Medical Record Number: [id number] Patient Account Number: [id number] Date of Birth/Sex: Treating RN: 07-Mar-1956 (67 y.o. Evelyn Campbell Primary Care Casilda Pickerill: [name] Other Clinician: Referring Mckenzie Toruno: Treating Gamaliel Charney/Extender: [name] [name]: 9 Active Problems Location of Pain Severity and Description of Pain Patient Has Paino No Site Locations Rate the pain. Current Pain Level: 0 Pain Management and Medication Current Pain Management: Electronic Signature(s) Evelyn Campbell, Evelyn Campbell ([id number]) [phone].pdf Page 6 of 8 Signed: 12/02/2022 4:55:33 PM By: Baruch Gouty RN, BSN Entered By: Baruch Gouty on 12/02/2022 13:24:52 -------------------------------------------------------------------------------- Patient/Caregiver Education Details Patient Name: Date of Service: [name], [place] 2/1/2024andnbsp1:15 PM Medical Record Number: [id number] Patient Account Number: [id number] Date of Birth/Gender: Treating RN: 1956-07-05 (67 y.o. Evelyn Campbell Primary Care Physician: [name] Other Clinician: Referring Physician: Treating Physician/Extender: [name] [name]: 9 Education Assessment Education Provided To: Patient Education Topics Provided Offloading: Methods: Explain/Verbal Responses:  Reinforcements needed, State content correctly Wound/Skin Impairment: Methods: Explain/Verbal Responses: Reinforcements needed, State content correctly Electronic Signature(s) Signed: 12/02/2022 4:55:33 PM By: Baruch Gouty RN, BSN Entered By: Baruch Gouty on 12/02/2022 13:34:35 -------------------------------------------------------------------------------- Wound Assessment Details Patient Name: Date of Service: [name], RO [facility] 12/02/2022 1:15 PM Medical Record Number: [id number] Patient Account Number: [id number] Date of Birth/Sex: Treating RN: May 07, 1956 (67 y.o. Evelyn Campbell Primary Care Khyla Mccumbers: [name] Other Clinician: Referring Dijuan Sleeth: Treating Boe Deans/Extender: [name] [name]: 9 Wound Status Wound Number: 4 Primary Pressure Ulcer Etiology: Wound Location: Right, Plantar Foot Wound Open Wounding Event: Gradually Appeared Status: Date Acquired: 11/24/2022 Comorbid Lymphedema, Hypertension, Peripheral Venous Disease, Gout, Weeks Of Treatment: 1 History: Rheumatoid Arthritis, Neuropathy Clustered Wound: No Photos Evelyn Campbell, Evelyn Campbell ([id number]) [id number] Page 7 of 8 Wound Measurements Length: (cm) Width: (cm) Depth: (cm) Area: (cm) Volume: (cm) 0 % Reduction in Area: 100% 0 % Reduction in Volume: 100% 0 Epithelialization: Large (67-100%) 0 Tunneling: No 0 Undermining: No Wound Description Classification: Category/Stage II Exudate Amount:  None Present Foul Odor After Cleansing: No Slough/Fibrino No Wound Bed Granulation Amount: None Present (0%) Exposed Structure Necrotic Amount: None Present (0%) Fascia Exposed: No Fat Layer (Subcutaneous Tissue) Exposed: No Tendon Exposed: No Muscle Exposed: No Joint Exposed: No Bone Exposed: No Periwound Skin Texture Texture Color No Abnormalities Noted: Yes No Abnormalities Noted: Yes Moisture Temperature / Pain No Abnormalities  Noted: Yes Temperature: No Abnormality Electronic Signature(s) Signed: 12/02/2022 4:55:33 PM By: Baruch Gouty RN, BSN Entered By: Baruch Gouty on 12/02/2022 13:33:36 -------------------------------------------------------------------------------- [place] Details Patient Name: Date of Service: [name], RO [facility] 12/02/2022 1:15 PM Medical Record Number: [id number] Patient Account Number: [id number] Date of Birth/Sex: Treating RN: 04-03-1956 (67 y.o. Evelyn Campbell Primary Care Tywana Robotham: [name] Other Clinician: Referring Sena Hoopingarner: Treating Avneet Ashmore/Extender: [name] [name]: 9 Vital Signs Time Taken: 13:24 Temperature (F): 98.1 Height (in): 65 Pulse (bpm): 60 Source: Stated Respiratory Rate (breaths/min): 18 Weight (lbs): 233 Blood Pressure (mmHg): 133/84 Source: Stated Reference Range: 80 - 120 mg / dl Body Mass Index (BMI): 38.8 Electronic Signature(s) Signed: 12/02/2022 4:55:33 PM By: Baruch Gouty RN, BSN Evelyn Campbell, Evelyn Campbell ([id number]) [phone].pdf Page 8 of 8 Entered By: Baruch Gouty on 12/02/2022 13:24:43

## 2022-12-02 NOTE — Progress Notes (Signed)
Evelyn Campbell, Evelyn Campbell ([id number]) [id number] Page 1 of 8 Visit Report for 12/02/2022 Chief Complaint Document Details Patient Name: Date of Service: Evelyn Campbell [facility] 12/02/2022 1:15 PM Medical Record Number: [id number] Patient Account Number: [id number] Date of Birth/Sex: Treating RN: July 16, 1956 (67 y.o. F) Primary Care Provider: [name] Other Clinician: Referring Provider: Treating Provider/Extender: [name] [name]: 9 Information Obtained from: Patient Chief Complaint Patient seen for complaints of Non-Healing Wound. 09/27/2022: re-opening of wound R foot Electronic Signature(s) Signed: 12/02/2022 1:51:12 PM By: [name] MD FACS Entered By: [name] on 12/02/2022 13:51:12 -------------------------------------------------------------------------------- HPI Details Patient Name: Date of Service: Evelyn Campbell, Evelyn Campbell [facility] 12/02/2022 1:15 PM Medical Record Number: [id number] Patient Account Number: [id number] Date of Birth/Sex: Treating RN: [birth year] (67 y.o. F) Primary Care Provider: [name] Other Clinician: Referring Provider: Treating Provider/Extender: [name] [name]: 9 History of Present Illness HPI Description: ADMISSION 01/21/2022 This is a 67 year old woman presenting with an ulcer on the right fifth metatarsal head. She says that it has been present for about 6 weeks. She is not certain how it developed, but she does have peripheral neuropathy. She also has lymphedema, hypertension, metabolic syndrome/prediabetes, but does not carry an actual diagnosis of diabetes nor is she on any medications for this. 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 ABI in clinic today was 0.8. She has been treated in the past for lymphedema, but has been unable to tolerate compression. She does have lymphedema pumps but does not use these  regularly. She had been applying Neosporin and new skin to her wound, but it did not really change much. She was referred here by her primary care provider for further evaluation and management. 01/28/2022: 1 week follow-up. Apparently there was some miscommunication with home health and they did not come out to help her with her dressing changes. The patient and her husband have been doing them themselves. The wound actually looks quite good, with decreased dimensions and a very nice healthy surface. 02/05/2022: The home health nurse came out during the week and the patient was happy with the care. The wound is smaller today with a clean base. 02/12/2022: The wound continues to contract and is quite small today. There is a bit of callus buildup, but the wound surface itself is very clean. 02/19/2022: The wound is nearly closed. There is a small amount of callus buildup. No slough or drainage. 02/25/2022: The wound is down to just a sliver. She continues to build up callus. No concern for infection. 03/04/2022: The wound is closed. READMISSION 04/12/2022 Last week, the patient noticed a spot of blood on her stocking where her wound had been. She noted that the wound was open. She began applying the Prisma silver collagen that we had used during her last admission. She wrapped it up and on Friday when she changed the dressing, she noticed that there was a new wound on the dorsal part of her right foot overlying the fifth metatarsal. Today it is quite red and tender. There is slough on the surface. The wound on the plantar surface has periwound callus accumulation but otherwise appears fairly clean. Evelyn Campbell, Evelyn Campbell ([id number]) [id number] Page 2 of 8 04/19/2022: The culture that we took last week somehow has been lost and we have no results. The patient has been applying topical gentamicin and silver alginate to her wound, however and both of them looks smaller. The redness and  tenderness has improved significantly. Her foot remains macerated in appearance, however. She had some bleeding from the plantar wound. On inspection, it looks like secondary to moisture, the surrounding skin and callus have lifted. When I probed with a thin cotton applicator, this created some minor bleeding and I think this is likely what happened. The tissue is just quite friable. 04/22/2022: The culture data were finally found and she did grow Proteus mirabilis from her wound. I prescribed Bactrim for her. She is here today for her first cast change. Both wound sites look cleaner and smaller today even after just a few days of casting. 04/26/2022: Both wounds are smaller and very clean. She is completing a course of Bactrim for Proteus mirabilis grown from her wound. 04/29/2022: The patient contacted our office yesterday concerned that the cast was pushing too hard against her great toe. She is here today to have the cast removed and a new one reapplied. The wound on the dorsal lateral part of her foot is closed. The wound on the plantar surface of the fifth metatarsal head is smaller and epithelializing. 05/06/2022: Her wounds are closed. READMISSION 09/27/2022 She returns with reopening of her wound on the right fifth metatarsal area after she healed in July, she was seen by Dr. Milinda Pointer at [facility]. She was apparently wearing accommodative orthotics that were in good condition and he recommended that she continue to wear those. He also identified that her shoes seemed narrow and that she should consider a wider shoe. We had recommended that she wear a callus pad or a foam donut of sorts to protect the area but she has not been doing this. She is wearing wide shoes but despite this, over the past week, she says that she opened up the wound and the exact same location as before. On inspection, it appears that she probably had developed a large callus that ultimately broke down deep to the  surface. The tissue is macerated and the fat layer is exposed. There is no purulent drainage or malodor. No visible signs of infection. 10/04/2022: Even after just a week, there has been a substantial improvement in her wound. There has been epithelialization such that it has split into 2 separate openings. There is some slough accumulation on the surface but the periwound is in better condition. 12/11; considerable improvement in the plantar foot wound. We replaced the total contact cast. The patient tells me she went to see podiatry for custom orthotics but she did not use them but now thinks she will spend the money to get them. I am not exactly sure where her podiatrist [Dr. Hyatt] practices but will she has an appointment in January. 10/20/2022: The wound is down to just a narrow superficial opening on the plantar surface of her foot. 10/27/2022: Her wound is healed. 11/24/2022: Unfortunately, her wound reopened. 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 The wound is small and clean with just a small amount of slough on the surface. It is located on the plantar surface of her fifth metatarsal head. 12/02/2022: Her wound is healed. Electronic Signature(s) Signed: 12/02/2022 1:54:25 PM By: [name] MD FACS Entered By: [name] on 12/02/2022 13:54:24 -------------------------------------------------------------------------------- Physical Exam Details Patient Name: Date of Service: Evelyn Campbell, Evelyn Campbell [facility] 12/02/2022 1:15 PM Medical  Record Number: [id number] Patient Account Number: [id number] Date of Birth/Sex: Treating RN: [birth year] (67 y.o. F) Primary Care Provider: [name] Other Clinician: Referring Provider: Treating Provider/Extender: [name] Weeks in  Treatment: 9 Constitutional . . . . no acute distress. Respiratory Normal work of breathing on room air. Notes 12/02/2022: Her wound is healed. Electronic Signature(s) Signed: 12/02/2022 1:54:52 PM By: [name] MD FACS Entered By: [name] on 12/02/2022 13:54:52 Evelyn Campbell, Evelyn Campbell ([id number]) [id number] Page 3 of 8 -------------------------------------------------------------------------------- Physician Orders Details Patient Name: Date of Service: Evelyn Campbell [facility] 12/02/2022 1:15 PM Medical Record Number: [id number] Patient Account Number: [id number] Date of Birth/Sex: Treating RN: Aug 23, 1956 (67 y.o. [name] Primary Care Provider: [name] Other Clinician: Referring Provider: Treating Provider/Extender: [name] [name]: 9 Verbal / Phone Orders: No Diagnosis Coding ICD-10 Coding Code Description [phone] Non-pressure chronic ulcer of other part of right foot with fat layer exposed I87.2 Venous insufficiency (chronic) (peripheral) G62.9 Polyneuropathy, unspecified E66.01 Morbid (severe) obesity due to excess calories Discharge From [facility] Services Discharge from [place] Bathing/ Shower/ Hygiene May shower and wash wound with soap and water. Edema Control - Lymphedema / SCD / Other Avoid standing for long periods of time. Patient to wear own compression stockings every day. Exercise regularly Off-Loading Other: - use callous pad on right foot to protect healed area using 1/4 in. orthopedic felt Electronic Signature(s) Signed: 12/02/2022 2:29:45 PM By: [name] MD FACS Entered By: [name] on 12/02/2022 13:55:00 -------------------------------------------------------------------------------- Problem List Details Patient Name: Date of Service: Evelyn Campbell, Evelyn Campbell [facility] 12/02/2022 1:15 PM Medical Record Number: [id number] Patient Account Number: [id number] Date of  Birth/Sex: Treating RN: 24-Mar-1956 (67 y.o. [name] Primary Care Provider: [name] Other Clinician: Referring Provider: Treating Provider/Extender: [name] [name]: 9 Active Problems ICD-10 Encounter Code Description Active Date MDM Diagnosis L97.512 Non-pressure chronic ulcer of other part of right foot with fat layer exposed 09/27/2022 No Yes I87.2 Venous insufficiency (chronic) (peripheral) 09/27/2022 No Yes Evelyn Campbell, Evelyn Campbell ([id number]) [id number] Page 4 of 8 G62.9 Polyneuropathy, unspecified 09/27/2022 No Yes E66.01 Morbid (severe) obesity due to excess calories 09/27/2022 No Yes Inactive Problems Resolved Problems Electronic Signature(s) Signed: 12/02/2022 1:51:00 PM By: [name] MD FACS Entered By: [name] on 12/02/2022 13:51:00 -------------------------------------------------------------------------------- Progress Note Details Patient Name: Date of Service: Evelyn Campbell, Evelyn Campbell [facility] 12/02/2022 1:15 PM Medical Record Number: [id number] Patient Account Number: [id number] Date of Birth/Sex: Treating RN: Mar 29, 1956 (67 y.o. F) Primary Care Provider: [name] Other Clinician: Referring Provider: Treating Provider/Extender: [name] [name]: 9 Subjective Chief Complaint Information obtained from Patient Patient seen for complaints of Non-Healing Wound. 09/27/2022: re-opening of wound R foot History of Present Illness (HPI) ADMISSION 01/21/2022 This is a 67 year old woman presenting with an ulcer on the right fifth metatarsal head. She says that it has been present for about 6 weeks. She is not certain how it developed, but she does have peripheral neuropathy. She also has lymphedema, hypertension, metabolic syndrome/prediabetes, but does not carry an actual diagnosis of diabetes nor is she on any medications for this. She does have  idiopathic peripheral neuropathy and limited sensation in her feet. ABI in clinic today was 0.8. She has been treated in the past for lymphedema, but has been unable to tolerate compression. She does have lymphedema pumps but does not use these regularly. She had been applying Neosporin and  new skin to her wound, but it did not really change much. She was referred here by her primary care provider for further evaluation and management. 01/28/2022: 1 week follow-up. Apparently there was some miscommunication with home health and they did not come out to help her with her dressing changes. The patient and her husband have been doing them themselves. The wound actually looks quite good, with decreased dimensions and a very nice healthy surface. 02/05/2022: The home health nurse came out during the week and the patient was happy with the care. The wound is smaller today with a clean base. 02/12/2022: The wound continues to contract and is quite small today. There is a bit of callus buildup, but the wound surface itself is very clean. 02/19/2022: The wound is nearly closed. There is a small amount of callus buildup. No slough or drainage. 02/25/2022: The wound is down to just a sliver. She continues to build up callus. No concern for infection. 03/04/2022: The wound is closed. READMISSION 04/12/2022 Last week, the patient noticed a spot of blood on her stocking where her wound had been. She noted that the wound was open. She began applying the Prisma silver collagen that we had used during her last admission. She wrapped it up and on Friday when she changed the dressing, she noticed that there was a new wound on the dorsal part of her right foot overlying the fifth metatarsal. Today it is quite red and tender. There is slough on the surface. The wound on the plantar surface has periwound callus accumulation but otherwise appears fairly clean. 04/19/2022: The culture that we took last week somehow has been lost  and we have no results. The patient has been applying topical gentamicin and silver alginate to her wound, however and both of them looks smaller. The redness and tenderness has improved significantly. Her foot remains macerated in appearance, however. She had some bleeding from the plantar wound. On inspection, it looks like secondary to moisture, the surrounding skin and callus have lifted. When I probed with a thin cotton applicator, this created some minor bleeding and I think this is likely what happened. The tissue is just quite friable. 04/22/2022: The culture data were finally found and she did grow Proteus mirabilis from her wound. I prescribed Bactrim for her. She is here today for her first cast change. Both wound sites look cleaner and smaller today even after just a few days of casting. 04/26/2022: Both wounds are smaller and very clean. She is completing a course of Bactrim for Proteus mirabilis grown from her wound. 04/29/2022: The patient contacted our office yesterday concerned that the cast was pushing too hard against her great toe. She is here today to have the cast Evelyn Campbell, Evelyn Campbell ([id number]) [phone].pdf Page 5 of 8 removed and a new one reapplied. The wound on the dorsal lateral part of her foot is closed. The wound on the plantar surface of the fifth metatarsal head is smaller and epithelializing. 05/06/2022: Her wounds are closed. READMISSION 09/27/2022 She returns with reopening of her wound on the right fifth metatarsal area after she healed in July, she was seen by Dr. Milinda Pointer at [facility]. She was apparently wearing accommodative orthotics that were in good condition and he recommended that she continue to wear those. He also identified that her shoes seemed narrow and that she should consider a wider shoe. We had recommended that she wear a callus pad or a foam donut of sorts to protect the  area but she has not been doing this. She is  wearing wide shoes but despite this, over the past week, she says that she opened up the wound and the exact same location as before. On inspection, it appears that she probably had developed a large callus that ultimately broke down deep to the surface. The tissue is macerated and the fat layer is exposed. There is no purulent drainage or malodor. No visible signs of infection. 10/04/2022: Even after just a week, there has been a substantial improvement in her wound. There has been epithelialization such that it has split into 2 separate openings. There is some slough accumulation on the surface but the periwound is in better condition. 12/11; considerable improvement in the plantar foot wound. We replaced the total contact cast. The patient tells me she went to see podiatry for custom orthotics but she did not use them but now thinks she will spend the money to get them. I am not exactly sure where her podiatrist [Dr. Hyatt] practices but will she has an appointment in January. 10/20/2022: The wound is down to just a narrow superficial opening on the plantar surface of her foot. 10/27/2022: Her wound is healed. 11/24/2022: Unfortunately, her wound reopened. 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 The wound is small and clean with just a small amount of slough on the surface. It is located on the plantar surface of her fifth metatarsal head. 12/02/2022: Her wound is healed. Patient History Information obtained from Patient. Family History Heart Disease - Father, No family history of Cancer, Diabetes, Hereditary Spherocytosis, Hypertension, Kidney Disease, Lung Disease, Seizures, Stroke, Thyroid Problems, Tuberculosis. Social History Never smoker, Marital Status - Married, Alcohol Use - Never, Drug Use -  No History, Caffeine Use - Never. Medical History Hematologic/Lymphatic Patient has history of Lymphedema Cardiovascular Patient has history of Hypertension, Peripheral Venous Disease Musculoskeletal Patient has history of Gout, Rheumatoid Arthritis - ankle Neurologic Patient has history of Neuropathy - Bilateral feet Hospitalization/Surgery History - Hernia repair (2016);Right ankle reconstruction. - insertion of mesh. - pancreatectomy. - toe surgery bilateral. - cholecystectomy. - abdominal hysterectomy. - tonsillectomy. - wrist surgery. Medical A Surgical History Notes Campbell Constitutional Symptoms (General Health) obesity Hematologic/Lymphatic Gout, vitamin D deficiency Cardiovascular PEriperal venous insufficiency Gastrointestinal Diverticular disease of colon: GERD, fatty liver Endocrine Prediabetes Neurologic Idiopathic peripheral neuropathy Objective Constitutional no acute distress. Vitals Time Taken: 1:24 PM, Height: 65 in, Source: Stated, Weight: 233 lbs, Source: Stated, BMI: 38.8, Temperature: 98.1 F, Pulse: 60 bpm, Respiratory Rate: 18 breaths/min, Blood Pressure: 133/84 mmHg. ERAINA, Evelyn Campbell ([id number]) [id number] Page 6 of 8 Respiratory Normal work of breathing on room air. General Notes: 12/02/2022: Her wound is healed. Integumentary (Hair, Skin) Wound #4 status is Open. Original cause of wound was Gradually Appeared. The date acquired was: 11/24/2022. The wound has been in treatment 1 weeks. The wound is located on the Evelyn Campbell. The wound measures 0cm length x 0cm width x 0cm depth; 0cm^2 area and 0cm^3 volume. There is no tunneling or undermining noted. There is a none present amount of drainage noted. There is no granulation within the wound bed. There is no necrotic tissue within the wound bed. The periwound skin appearance had no abnormalities noted for texture. The periwound skin appearance had no abnormalities noted for  moisture. The periwound skin appearance had no abnormalities noted for color. Periwound temperature was noted as No Abnormality. Assessment Active Problems ICD-10 Non-pressure chronic ulcer of other part of right foot with fat layer exposed Venous insufficiency (chronic) (peripheral) Polyneuropathy, unspecified Morbid (severe) obesity due to excess calories Plan Discharge From [facility] Services: Discharge from [place] Bathing/ Shower/ Hygiene: May shower and wash wound with soap and water. Edema Control - Lymphedema / SCD / Other: Avoid standing for long periods of time. Patient to wear own compression stockings every day. Exercise regularly Off-Loading: Other: - use callous pad on right foot to protect healed area using 1/4 in. orthopedic felt 12/02/2022: Her wound has healed. I have recommended that she go ahead and get custom orthotic inserts for her shoes. In the meantime, she should continue to use the thick felt padding that we have been using to create a modified callus pad for her. We will discharge her from the wound care center. She may follow-up as needed. Electronic Signature(s) Signed: 12/02/2022 1:55:52 PM By: [name] MD FACS Entered By: [name] on 12/02/2022 13:55:52 -------------------------------------------------------------------------------- HxROS Details Patient Name: Date of Service: Evelyn Campbell, Evelyn Campbell [facility] 12/02/2022 1:15 PM Medical Record Number: [id number] Patient Account Number: [id number] Date of Birth/Sex: Treating RN: 05/14/1956 (66 y.o. F) Primary Care Provider: [name] Other Clinician: Referring Provider: Treating Provider/Extender: [name] [name]: 9 Information Obtained From Patient Constitutional Symptoms ([place]) Medical History: Past Medical History Notes: obesity Evelyn Campbell, Evelyn Campbell ([id number]) [id number] Page 7 of 8 Hematologic/Lymphatic Medical  History: Positive for: Lymphedema Past Medical History Notes: Gout, vitamin D deficiency Cardiovascular Medical History: Positive for: Hypertension; Peripheral Venous Disease Past Medical History Notes: PEriperal venous insufficiency Gastrointestinal Medical History: Past Medical History Notes: Diverticular disease of colon: GERD, fatty liver Endocrine Medical History: Past Medical History Notes: Prediabetes Musculoskeletal Medical History: Positive for: Gout; Rheumatoid Arthritis - ankle Neurologic Medical History: Positive for: Neuropathy - Bilateral feet Past Medical History Notes: Idiopathic peripheral neuropathy Immunizations Pneumococcal Vaccine: Received Pneumococcal Vaccination: Yes Received Pneumococcal Vaccination On or After 60th Birthday: Yes Implantable Devices None Hospitalization / Surgery History Type of Hospitalization/Surgery Hernia repair (2016);Right ankle reconstruction insertion of mesh pancreatectomy toe surgery bilateral cholecystectomy abdominal hysterectomy tonsillectomy wrist surgery Family and Social History Cancer: No; Diabetes: No; Heart Disease: Yes - Father; Hereditary Spherocytosis: No; Hypertension: No; Kidney Disease: No; Lung Disease: No; Seizures: No; Stroke: No; Thyroid Problems: No; Tuberculosis: No; Never smoker; Marital Status - Married; Alcohol Use: Never; Drug Use: No History; Caffeine Use: Never; Financial Concerns: No; Food, Clothing or Shelter Needs: No; Support System Lacking: No; Transportation Concerns: No Electronic Signature(s) Signed: 12/02/2022 2:29:45 PM By: [name] MD FACS Entered By: [name] on 12/02/2022 13:54:30 SuperBill Details -------------------------------------------------------------------------------- Evelyn Campbell ([id number]) [id number] Page 8 of 8 Patient Name: Date of Service: Evelyn Campbell [facility] 12/02/2022 Medical Record Number: [id number] Patient  Account Number: [id number] Date of Birth/Sex: Treating RN: 10-Jul-1956 (67 y.o. [name] Primary Care Provider: [name] Other Clinician: Referring Provider: Treating Provider/Extender: [name] [name]: 9 Diagnosis Coding ICD-10 Codes Code Description [phone] Non-pressure chronic ulcer of other part of right foot with fat layer exposed I87.2 Venous insufficiency (chronic) (peripheral) G62.9 Polyneuropathy, unspecified E66.01 Morbid (severe) obesity due to excess calories Facility Procedures CPT4 Code Description Modifier Quantity [id number] - WOUND CARE VISIT-LEV 3 EST PT 1 Physician Procedures Quantity CPT4 Code Description Modifier [id number] - WC PHYS LEVEL  3 - EST PT 1 ICD-10 Diagnosis Description L97.512 Non-pressure chronic ulcer of other part of right foot with fat layer exposed Electronic Signature(s) Signed: 12/02/2022 1:57:19 PM By: [name] MD FACS Entered By: [name] on 12/02/2022 13:57:18

## 2022-12-10 ENCOUNTER — Encounter (HOSPITAL_BASED_OUTPATIENT_CLINIC_OR_DEPARTMENT_OTHER): Payer: Medicare Other | Admitting: General Surgery

## 2023-05-03 ENCOUNTER — Encounter: Payer: Self-pay | Admitting: Cardiology

## 2023-05-03 ENCOUNTER — Ambulatory Visit: Payer: Medicare Other | Attending: Cardiology | Admitting: Cardiology

## 2023-05-03 VITALS — BP 106/80 | HR 64 | Ht 65.0 in | Wt 228.6 lb

## 2023-05-03 DIAGNOSIS — Z8249 Family history of ischemic heart disease and other diseases of the circulatory system: Secondary | ICD-10-CM | POA: Diagnosis not present

## 2023-05-03 DIAGNOSIS — I10 Essential (primary) hypertension: Secondary | ICD-10-CM | POA: Diagnosis not present

## 2023-05-03 DIAGNOSIS — L97512 Non-pressure chronic ulcer of other part of right foot with fat layer exposed: Secondary | ICD-10-CM | POA: Diagnosis not present

## 2023-05-03 NOTE — Patient Instructions (Signed)
Medication Instructions:  Please discontinue your Asprin. Continue all other medications as listed.  *If you need a refill on your cardiac medications before your next appointment, please call your pharmacy*   Follow-Up: At [facility], you and your health needs are our priority.  As part of our continuing mission to provide you with exceptional heart care, we have created designated Provider Care Teams.  These Care Teams include your primary Cardiologist (physician) and Advanced Practice Providers (APPs -  Physician Assistants and Nurse Practitioners) who all work together to provide you with the care you need, when you need it.  We recommend signing up for the patient portal called "MyChart".  Sign up information is provided on this After Visit Summary.  MyChart is used to connect with patients for Virtual Visits (Telemedicine).  Patients are able to view lab/test results, encounter notes, upcoming appointments, etc.  Non-urgent messages can be sent to your provider as well.   To learn more about what you can do with MyChart, go to [email or web address].    Your next appointment:   1 year(s)  Provider:   Donato Schultz, MD

## 2023-05-03 NOTE — Progress Notes (Signed)
  Cardiology Office Note:  .   Date:  05/03/2023  ID:  Evelyn Campbell, DOB February 16, 1956, MRN [id number] PCP: Gweneth Dimitri, MD  [facility] Providers Cardiologist:  Donato Schultz, MD    History of Present Illness: .   Evelyn Campbell is a 67 y.o. female here for the follow-up of hypertension.  Father died MI age 61 She is statin intolerant taking Lovaza and Niaspan  Has had lymphedema decongestive therapy. Left leg, better.  Prior syncope with normal EKG likely vasovagal.  Has fallen a few times. Wound right foot. May need surgery.   Retired [employer]  Denies any chest pain.  ROS: Minor blood change in stool.  Periodic.  Studies Reviewed: Marland Kitchen   EKG Interpretation Date/Time:  Tuesday May 03 2023 16:22:52 EDT Ventricular Rate:  62 PR Interval:  152 QRS Duration:  82 QT Interval:  394 QTC Calculation: 399 R Axis:   71  Text Interpretation: Normal sinus rhythm Normal ECG When compared with ECG of 23-Jun-2015 15:30, No significant change was found Confirmed by Donato Schultz ([id number]) on 05/03/2023 4:52:48 PM    Echo 2020: EF 65%, mild aortic valve sclerosis, grade 2 diastolic dysfunction  Risk Assessment/Calculations:           Physical Exam:   VS:  BP 106/80   Pulse 64   Ht 5\' 5"  (1.651 m)   Wt 228 lb 9.6 oz (103.7 kg)   SpO2 95%   BMI 38.04 kg/m    Wt Readings from Last 3 Encounters:  05/03/23 228 lb 9.6 oz (103.7 kg)  12/23/21 238 lb (108 kg)  11/10/21 234 lb (106.1 kg)    GEN: Well nourished, well developed in no acute distress NECK: No JVD; No carotid bruits CARDIAC: RRR, no murmurs, rubs, gallops RESPIRATORY:  Clear to auscultation without rales, wheezing or rhonchi  ABDOMEN: Soft, non-tender, non-distended EXTREMITIES: Left greater than right lymphedema; No deformity   ASSESSMENT AND PLAN: .    Primary hypertension - Overall doing quite well with atenolol 100 mg at bedtime amlodipine 5 mg a day.  Family history of ischemic heart disease - Father MI  age 7.  Continue appropriate exercise.  Stationary bicycle.  Mixed hyperlipidemia - Statin intolerant.  Prior LDL 111 triglycerides 160.  On Niaspan.  Has very minimal aortic atherosclerosis and iliac atherosclerosis.  I would not be opposed to her stopping the aspirin 81 mg especially if she has a some slight blood tinge in her stool.  She is seeing GI.  Lymphedema of leg.  Left leg.  Has gone through physical therapy for this Dr. Jacolyn Reedy at [facility].  Struggling.  Had a right foot ulcer.  Seeing podiatry.  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  May need left leg operated on as well after.  Right foot ulcer with fat layer exposed, hospitalization 01/27/2022.      Dispo: 1 year  Signed, Donato Schultz, MD

## 2023-09-28 ENCOUNTER — Other Ambulatory Visit: Payer: Self-pay

## 2023-09-28 ENCOUNTER — Encounter (HOSPITAL_COMMUNITY): Payer: Self-pay

## 2023-09-28 ENCOUNTER — Inpatient Hospital Stay (HOSPITAL_COMMUNITY)
Admission: EM | Admit: 2023-09-28 | Discharge: 2023-10-04 | DRG: 493 | Disposition: A | Discharge status: Skilled Nursing Facility | Payer: Medicare Other | Attending: Internal Medicine | Admitting: Internal Medicine

## 2023-09-28 DIAGNOSIS — B961 Klebsiella pneumoniae [K. pneumoniae] as the cause of diseases classified elsewhere: Secondary | ICD-10-CM | POA: Diagnosis present

## 2023-09-28 DIAGNOSIS — M25572 Pain in left ankle and joints of left foot: Principal | ICD-10-CM

## 2023-09-28 DIAGNOSIS — R7303 Prediabetes: Secondary | ICD-10-CM | POA: Diagnosis present

## 2023-09-28 DIAGNOSIS — Z90710 Acquired absence of both cervix and uterus: Secondary | ICD-10-CM

## 2023-09-28 DIAGNOSIS — E876 Hypokalemia: Secondary | ICD-10-CM

## 2023-09-28 DIAGNOSIS — M14671 Charcot's joint, right ankle and foot: Secondary | ICD-10-CM | POA: Diagnosis present

## 2023-09-28 DIAGNOSIS — Z880 Allergy status to penicillin: Secondary | ICD-10-CM

## 2023-09-28 DIAGNOSIS — Z79899 Other long term (current) drug therapy: Secondary | ICD-10-CM

## 2023-09-28 DIAGNOSIS — R700 Elevated erythrocyte sedimentation rate: Secondary | ICD-10-CM | POA: Diagnosis present

## 2023-09-28 DIAGNOSIS — Z8673 Personal history of transient ischemic attack (TIA), and cerebral infarction without residual deficits: Secondary | ICD-10-CM

## 2023-09-28 DIAGNOSIS — Z6836 Body mass index (BMI) 36.0-36.9, adult: Secondary | ICD-10-CM

## 2023-09-28 DIAGNOSIS — M25472 Effusion, left ankle: Secondary | ICD-10-CM

## 2023-09-28 DIAGNOSIS — B9561 Methicillin susceptible Staphylococcus aureus infection as the cause of diseases classified elsewhere: Secondary | ICD-10-CM | POA: Diagnosis present

## 2023-09-28 DIAGNOSIS — K760 Fatty (change of) liver, not elsewhere classified: Secondary | ICD-10-CM | POA: Diagnosis present

## 2023-09-28 DIAGNOSIS — M65972 Unspecified synovitis and tenosynovitis, left ankle and foot: Secondary | ICD-10-CM | POA: Diagnosis present

## 2023-09-28 DIAGNOSIS — M00072 Staphylococcal arthritis, left ankle and foot: Principal | ICD-10-CM | POA: Diagnosis present

## 2023-09-28 DIAGNOSIS — G8929 Other chronic pain: Secondary | ICD-10-CM | POA: Diagnosis present

## 2023-09-28 DIAGNOSIS — M14672 Charcot's joint, left ankle and foot: Secondary | ICD-10-CM | POA: Diagnosis present

## 2023-09-28 DIAGNOSIS — Z7985 Long-term (current) use of injectable non-insulin antidiabetic drugs: Secondary | ICD-10-CM

## 2023-09-28 DIAGNOSIS — E669 Obesity, unspecified: Secondary | ICD-10-CM | POA: Diagnosis present

## 2023-09-28 DIAGNOSIS — G629 Polyneuropathy, unspecified: Secondary | ICD-10-CM | POA: Diagnosis present

## 2023-09-28 DIAGNOSIS — Z9049 Acquired absence of other specified parts of digestive tract: Secondary | ICD-10-CM

## 2023-09-28 DIAGNOSIS — R17 Unspecified jaundice: Secondary | ICD-10-CM

## 2023-09-28 DIAGNOSIS — I10 Essential (primary) hypertension: Secondary | ICD-10-CM | POA: Diagnosis present

## 2023-09-28 DIAGNOSIS — E782 Mixed hyperlipidemia: Secondary | ICD-10-CM | POA: Diagnosis present

## 2023-09-28 DIAGNOSIS — E871 Hypo-osmolality and hyponatremia: Secondary | ICD-10-CM

## 2023-09-28 DIAGNOSIS — S91301A Unspecified open wound, right foot, initial encounter: Secondary | ICD-10-CM | POA: Diagnosis present

## 2023-09-28 DIAGNOSIS — Z981 Arthrodesis status: Secondary | ICD-10-CM

## 2023-09-28 DIAGNOSIS — I129 Hypertensive chronic kidney disease with stage 1 through stage 4 chronic kidney disease, or unspecified chronic kidney disease: Secondary | ICD-10-CM | POA: Diagnosis present

## 2023-09-28 DIAGNOSIS — M009 Pyogenic arthritis, unspecified: Principal | ICD-10-CM

## 2023-09-28 DIAGNOSIS — X58XXXA Exposure to other specified factors, initial encounter: Secondary | ICD-10-CM | POA: Diagnosis present

## 2023-09-28 DIAGNOSIS — Z888 Allergy status to other drugs, medicaments and biological substances status: Secondary | ICD-10-CM

## 2023-09-28 DIAGNOSIS — L97519 Non-pressure chronic ulcer of other part of right foot with unspecified severity: Secondary | ICD-10-CM | POA: Diagnosis present

## 2023-09-28 DIAGNOSIS — K219 Gastro-esophageal reflux disease without esophagitis: Secondary | ICD-10-CM | POA: Diagnosis present

## 2023-09-28 DIAGNOSIS — E861 Hypovolemia: Secondary | ICD-10-CM | POA: Diagnosis present

## 2023-09-28 DIAGNOSIS — E441 Mild protein-calorie malnutrition: Secondary | ICD-10-CM | POA: Diagnosis present

## 2023-09-28 DIAGNOSIS — D696 Thrombocytopenia, unspecified: Secondary | ICD-10-CM

## 2023-09-28 DIAGNOSIS — M109 Gout, unspecified: Secondary | ICD-10-CM | POA: Diagnosis present

## 2023-09-28 DIAGNOSIS — R7401 Elevation of levels of liver transaminase levels: Secondary | ICD-10-CM

## 2023-09-28 DIAGNOSIS — G609 Hereditary and idiopathic neuropathy, unspecified: Secondary | ICD-10-CM | POA: Diagnosis present

## 2023-09-28 DIAGNOSIS — Z8249 Family history of ischemic heart disease and other diseases of the circulatory system: Secondary | ICD-10-CM

## 2023-09-28 LAB — COMPREHENSIVE METABOLIC PANEL
ALT: 54 U/L — ABNORMAL HIGH (ref 0–44)
AST: 51 U/L — ABNORMAL HIGH (ref 15–41)
Albumin: 3.2 g/dL — ABNORMAL LOW (ref 3.5–5.0)
Alkaline Phosphatase: 105 U/L (ref 38–126)
Anion gap: 11 (ref 5–15)
BUN: 23 mg/dL (ref 8–23)
CO2: 24 mmol/L (ref 22–32)
Calcium: 9.3 mg/dL (ref 8.9–10.3)
Chloride: 96 mmol/L — ABNORMAL LOW (ref 98–111)
Creatinine, Ser: 0.97 mg/dL (ref 0.44–1.00)
GFR, Estimated: >60 mL/min (ref >60)
Glucose, Bld: 114 mg/dL — ABNORMAL HIGH (ref 70–99)
Potassium: 3.2 mmol/L — ABNORMAL LOW (ref 3.5–5.1)
Sodium: 131 mmol/L — ABNORMAL LOW (ref 135–145)
Total Bilirubin: 1.4 mg/dL — ABNORMAL HIGH (ref <1.2)
Total Protein: 6.4 g/dL — ABNORMAL LOW (ref 6.5–8.1)

## 2023-09-28 LAB — CBC WITH DIFFERENTIAL/PLATELET
Abs Immature Granulocytes: 0.12 10*3/uL — ABNORMAL HIGH (ref 0.00–0.07)
Basophils Absolute: 0 10*3/uL (ref 0.0–0.1)
Basophils Relative: 0 %
Eosinophils Absolute: 0 10*3/uL (ref 0.0–0.5)
Eosinophils Relative: 0 %
HCT: 37 % (ref 36.0–46.0)
Hemoglobin: 12.4 g/dL (ref 12.0–15.0)
Immature Granulocytes: 1 %
Lymphocytes Relative: 7 %
Lymphs Abs: 1.2 10*3/uL (ref 0.7–4.0)
MCH: 29.9 pg (ref 26.0–34.0)
MCHC: 33.5 g/dL (ref 30.0–36.0)
MCV: 89.2 fL (ref 80.0–100.0)
Monocytes Absolute: 1.8 10*3/uL — ABNORMAL HIGH (ref 0.1–1.0)
Monocytes Relative: 11 %
Neutro Abs: 13.9 10*3/uL — ABNORMAL HIGH (ref 1.7–7.7)
Neutrophils Relative %: 81 %
Platelets: 118 10*3/uL — ABNORMAL LOW (ref 150–400)
RBC: 4.15 MIL/uL (ref 3.87–5.11)
RDW: 15 % (ref 11.5–15.5)
WBC: 17.1 10*3/uL — ABNORMAL HIGH (ref 4.0–10.5)
nRBC: 0 % (ref 0.0–0.2)

## 2023-09-28 LAB — SEDIMENTATION RATE: Sed Rate: 39 mm/h — ABNORMAL HIGH (ref 0–22)

## 2023-09-28 MED ORDER — MORPHINE SULFATE (PF) 4 MG/ML IV SOLN
4.0000 mg | Freq: Once | INTRAVENOUS | Status: AC
  Administered 2023-09-28: 4 mg via INTRAVENOUS
  Filled 2023-09-28: qty 1

## 2023-09-28 MED ORDER — LIDOCAINE-EPINEPHRINE (PF) 2 %-1:200000 IJ SOLN
10.0000 mL | Freq: Once | INTRAMUSCULAR | Status: AC
Start: 2023-09-28 — End: 2023-09-28
  Administered 2023-09-28: 10 mL via INTRADERMAL
  Filled 2023-09-28: qty 20

## 2023-09-28 NOTE — ED Provider Notes (Signed)
Care assumed from Dr. Jearld Fenton, patient with pain and swelling of her left ankle, pending results of arthrocentesis.  I have reviewed her preliminary reports from her arthrocentesis, and my interpretation is Gram stain positive for gram-positive cocci consistent with septic joint.  Cell count is only 3350, which is less than would be expected for septic arthritis.  However, positive Gram stain indicates clear joint infection.  I have ordered a dose of vancomycin.  I have discussed the case with Dr. Antionette Char of [facility], who agrees to admit the patient.  Results for orders placed or performed during the hospital encounter of 09/28/23  Body fluid culture w Gram Stain   Specimen: Ankle; Body Fluid  Result Value Ref Range   Specimen Description      ANKLE Performed at [facility], [address], [place], [place] [address]    Special Requests      NONE Performed at [facility], [address], [place], [place] [address]    Gram Stain      MODERATE WBC PRESENT,BOTH PMN AND MONONUCLEAR FEW GRAM POSITIVE COCCI Performed at [facility], [address] [place], [place] [address]    Culture PENDING    Report Status PENDING   CBC with Differential  Result Value Ref Range   WBC 17.1 (H) 4.0 - 10.5 K/uL   RBC 4.15 3.87 - 5.11 MIL/uL   Hemoglobin 12.4 12.0 - 15.0 g/dL   HCT 84.6 96.2 - 95.2 %   MCV 89.2 80.0 - 100.0 fL   MCH 29.9 26.0 - 34.0 pg   MCHC 33.5 30.0 - 36.0 g/dL   RDW 84.1 32.4 - 40.1 %   Platelets 118 (L) 150 - 400 K/uL   nRBC 0.0 0.0 - 0.2 %   Neutrophils Relative % 81 %   Neutro Abs 13.9 (H) 1.7 - 7.7 K/uL   Lymphocytes Relative 7 %   Lymphs Abs 1.2 0.7 - 4.0 K/uL   Monocytes Relative 11 %   Monocytes Absolute 1.8 (H) 0.1 - 1.0 K/uL   Eosinophils Relative 0 %   Eosinophils Absolute 0.0 0.0 - 0.5 K/uL   Basophils Relative 0 %   Basophils Absolute 0.0 0.0 - 0.1 K/uL   Immature Granulocytes 1 %   Abs Immature Granulocytes  0.12 (H) 0.00 - 0.07 K/uL  Comprehensive metabolic panel  Result Value Ref Range   Sodium 131 (L) 135 - 145 mmol/L   Potassium 3.2 (L) 3.5 - 5.1 mmol/L   Chloride 96 (L) 98 - 111 mmol/L   CO2 24 22 - 32 mmol/L   Glucose, Bld 114 (H) 70 - 99 mg/dL   BUN 23 8 - 23 mg/dL   Creatinine, Ser 0.27 0.44 - 1.00 mg/dL   Calcium 9.3 8.9 - 25.3 mg/dL   Total Protein 6.4 (L) 6.5 - 8.1 g/dL   Albumin 3.2 (L) 3.5 - 5.0 g/dL   AST 51 (H) 15 - 41 U/L   ALT 54 (H) 0 - 44 U/L   Alkaline Phosphatase 105 38 - 126 U/L   Total Bilirubin 1.4 (H) <1.2 mg/dL   GFR, Estimated >66 >44 mL/min   Anion gap 11 5 - 15  Sedimentation rate  Result Value Ref Range   Sed Rate 39 (H) 0 - 22 mm/hr  Synovial cell count + diff, w/ crystals  Result Value Ref Range   Color, Synovial RED (A) YELLOW   Appearance-Synovial TURBID (A) CLEAR   Crystals, Fluid NONE  SEEN    WBC, Synovial 3,350 (H) 0 - 200 /cu mm   Neutrophil, Synovial 74 (H) 0 - 25 %   Lymphocytes-Synovial Fld 11 0 - 20 %   Monocyte-Macrophage-Synovial Fluid 15 (L) 50 - 90 %   DG Ankle Complete Left  Result Date: 09/29/2023 CLINICAL DATA:  Left ankle injury, swelling EXAM: LEFT ANKLE COMPLETE - 3+ VIEW COMPARISON:  None Available. FINDINGS: There is destruction of the talar neck and anterior process of the calcaneus as well as the tarsal bones mid with extensive erosion is identified. While initially changes noted on prior examination may have related to Charcot joint, extensive erosions favor inflammatory process such as osteomyelitis. Extensive surrounding soft tissue swelling is present. Erosions now present distal tibia, distal fibula, and talar dome. IMPRESSION: 1. Extensive erosions with destruction of the distal talus, anterior process calcaneus, and the tarsal bones of the midfoot. Erosions now present distal tibia, distal fibula, and talar dome. An inflammatory process such as osteomyelitis is favored and clinical correlation is suggested. Electronically  Signed   By: Helyn Numbers M.D.   On: 09/29/2023 00:58      Dione Booze, MD 09/29/23 [phone]

## 2023-09-28 NOTE — ED Triage Notes (Signed)
Pt presents via EMS c/o left ankle swelling and pain. Denies injury. EMS reports pt is scheduled for ankle reconstruction of same ankle on Jan 29th.

## 2023-09-28 NOTE — ED Provider Notes (Signed)
[place] EMERGENCY DEPARTMENT AT [facility] Provider Note   CSN: [id number] Arrival date & time: 09/28/23  1933     History {Add pertinent medical, surgical, social history, OB history to HPI:1} Chief Complaint  Patient presents with   Joint Swelling    Evelyn Campbell is a 67 y.o. female with PMH as listed below who presents via EMS c/o left ankle swelling and pain x 2 days that is very abnormal for her, unable to walk, unlike any of her chronic pain. No fevers/chills. Pain with passive ROM, no h/o similar pain. No recent procedures on ankle. Scheduled for arthrodesis for charcot foot in January with Dr. Lorin Picket. Denies injury. EMS reports pt is scheduled for ankle reconstruction of same ankle on Jan 29th. ***   continued left ankle pain with charcot foot. She is well-known to our clinic after she underwent right subtalar arthrodesis for flatfoot reconstruction in 2015 and soft tissue mass excision right lateral foot in 2020. She presents today with continued left foot pain that mainly occurs at the end of the day after prolonged standing/walking. Patient states that the pain is located at the posterior aspect of her plantar surface. Patient has tried a lace up ankle brace with no relief, and recently obtained custom orthotics for which she has started using. Since last visit she has been using Tayco ankle brace which she relates is helpful for stability but is not improving her pain. She is interested in surgical correction and pursuing TTC fusion.  Of note patient has a healing ulcer on the plantar aspect of her right foot that is being followed at wound care. It is healing with no signs of infection or drainage at this visit.  Plan: Discussed surgical and conservative treatment. At this time patient would like to pursue surgical correction. Will plan for TTC hindfoot nail with achilles tendon lengthening. Consent obtained at this visit. Patient is taking Ozempic and was  counseled that she would need to stop taking this medication 7-10 days prior to surgery.  To OR for Left Ankle and Subtalar Fusions, and Heel Cord Lengthening (Perc TAL). Will plan for anterior and sinus tarsi approaches, and use of a Hindfoot Fusion Nail.    Past Medical History:  Diagnosis Date   Arthritis    R ankle    Back pain    Chronic kidney disease    seen by Nephrology at [facility], due to all her numbers were"off", she  remarks that she was sent to superhydrate & then her labs values corrected.  Pt. will f/u /w nephrology   Constipation    Fatty liver    Gallbladder problem    GERD (gastroesophageal reflux disease)    occ   Gout    Hyperlipidemia    Hypertension    Joint pain    Lower extremity edema    Obesity    Peripheral neuropathy    Pneumonia 1980's   hx- not needed hospitalization    Vitamin D deficiency        Home Medications Prior to Admission medications   Medication Sig Start Date End Date Taking? Authorizing Provider  acetaminophen (TYLENOL) 500 MG tablet Take 500 mg by mouth as needed for mild pain.    [provider]  allopurinol (ZYLOPRIM) 300 MG tablet Take 300 mg by mouth daily. 08/09/20   [provider]  amLODipine (NORVASC) 5 MG tablet Take 1 tablet (5 mg total) by mouth daily. 04/16/21   Julaine Fusi, NP  atenolol (TENORMIN) 100 MG tablet Take 100 mg by mouth at bedtime. 06/08/15   [provider]  Cholecalciferol (VITAMIN D) 2000 UNITS tablet Take 2,000 Units by mouth daily.    [provider]  colchicine 0.6 MG tablet 0.12 mg as needed.    [provider]  docusate sodium (COLACE) 100 MG capsule Take 200 mg by mouth daily.     [provider]  gabapentin (NEURONTIN) 300 MG capsule Take 1,200 mg by mouth at bedtime.     [provider]  Multiple Vitamin (MULTIVITAMIN WITH MINERALS) TABS tablet Take 1 tablet by mouth 2 (two) times daily. [facility]    [provider]   niacin (NIASPAN) 1000 MG CR tablet Take 2 tablets (2,000 mg total) by mouth at bedtime. 08/18/20   Helane Rima, DO  omega-3 acid ethyl esters (LOVAZA) 1 G capsule Take 2 g by mouth 2 (two) times daily.     [provider]  omeprazole (PRILOSEC) 20 MG capsule Take 20 mg by mouth daily. 04/28/20   [provider]  OZEMPIC, 0.25 OR 0.5 MG/DOSE, 2 MG/3ML SOPN Inject 1 mg into the skin once a week. 04/20/22   [provider]      Allergies    Celebrex [celecoxib], Ibuprofen, Meloxicam, Amoxicillin-pot clavulanate, Lipitor [atorvastatin], Nsaids, Statins, Amoxicillin, and Penicillins    Review of Systems   Review of Systems A 10 point review of systems was performed and is negative unless otherwise reported in HPI.  Physical Exam Updated Vital Signs BP 106/60 (BP Location: Right Arm)   Pulse 72   Temp 98.3 F (36.8 C) (Oral)   Resp 16   Ht 5\' 5"  (1.651 m)   Wt 100.7 kg   SpO2 95%   BMI 36.94 kg/m  Physical Exam General: Normal appearing {Desc; female/female:11659}, lying in bed.  HEENT: PERRLA, Sclera anicteric, MMM, trachea midline.  Cardiology: RRR, no murmurs/rubs/gallops. BL radial and DP pulses equal bilaterally.  Resp: Normal respiratory rate and effort. CTAB, no wheezes, rhonchi, crackles.  Abd: Soft, non-tender, non-distended. No rebound tenderness or guarding.  GU: Deferred. MSK: No peripheral edema or signs of trauma. Extremities without deformity or TTP. No cyanosis or clubbing. Skin: warm, dry. No rashes or lesions. Back: No CVA tenderness Neuro: A&Ox4, CNs II-XII grossly intact. MAEs. Sensation grossly intact.  Psych: Normal mood and affect.   ED Results / Procedures / Treatments   Labs (all labs ordered are listed, but only abnormal results are displayed) Labs Reviewed - No data to display  EKG None  Radiology No results found.  Procedures Procedures  {Document cardiac monitor, telemetry assessment procedure when  appropriate:1}  Medications Ordered in ED Medications - No data to display  ED Course/ Medical Decision Making/ A&P                          Medical Decision Making Amount and/or Complexity of Data Reviewed Labs: ordered. Decision-making details documented in ED Course.  Risk Prescription drug management.    This patient presents to the ED for concern of ***, this involves an extensive number of treatment options, and is a complaint that carries with it a high risk of complications and morbidity.  I considered the following differential and admission for this acute, potentially life threatening condition.   MDM:    ***  Clinical Course as of 09/29/23 0000  Wed Sep 28, 2023  2247 WBC(!): 17.1 +leukocytosis with left shift [HN]  2333 Sed Rate(!): 39 Elevated ESR [HN]    Clinical Course User Index [HN] Loetta Rough, MD    Labs: I Ordered, and personally interpreted labs.  The pertinent results include:  ***  Imaging Studies ordered: I ordered imaging studies including *** I independently visualized and interpreted imaging. I agree with the radiologist interpretation  Additional history obtained from ***.  External records from outside source obtained and reviewed including ***  Cardiac Monitoring: The patient was maintained on a cardiac monitor.  I personally viewed and interpreted the cardiac monitored which showed an underlying rhythm of: ***  Reevaluation: After the interventions noted above, I reevaluated the patient and found that they have :{resolved/improved/worsened:23923::"improved"}  Social Determinants of Health: ***  Disposition:  ***  Co morbidities that complicate the patient evaluation  Past Medical History:  Diagnosis Date   Arthritis    R ankle    Back pain    Chronic kidney disease    seen by Nephrology at [facility], due to all her numbers were"off", she  remarks that she was sent to superhydrate & then her labs values corrected.  Pt.  will f/u /w nephrology   Constipation    Fatty liver    Gallbladder problem    GERD (gastroesophageal reflux disease)    occ   Gout    Hyperlipidemia    Hypertension    Joint pain    Lower extremity edema    Obesity    Peripheral neuropathy    Pneumonia 1980's   hx- not needed hospitalization    Vitamin D deficiency      Medicines No orders of the defined types were placed in this encounter.   I have reviewed the patients home medicines and have made adjustments as needed  Problem List / ED Course: Problem List Items Addressed This Visit   None        {Document critical care time when appropriate:1} {Document review of labs and clinical decision tools ie heart score, Chads2Vasc2 etc:1}  {Document your independent review of radiology images, and any outside records:1} {Document your discussion with family members, caretakers, and with consultants:1} {Document social determinants of health affecting pt's care:1} {Document your decision making why or why not admission, treatments were needed:1}  This note was created using dictation software, which may contain spelling or grammatical errors.

## 2023-09-29 ENCOUNTER — Inpatient Hospital Stay (HOSPITAL_COMMUNITY): Payer: Medicare Other | Admitting: Anesthesiology

## 2023-09-29 ENCOUNTER — Encounter (HOSPITAL_COMMUNITY)
Admission: EM | Disposition: A | Discharge status: Skilled Nursing Facility | Payer: Self-pay | Source: Home / Self Care | Attending: Internal Medicine

## 2023-09-29 ENCOUNTER — Emergency Department (HOSPITAL_COMMUNITY): Payer: Medicare Other

## 2023-09-29 ENCOUNTER — Other Ambulatory Visit: Payer: Self-pay

## 2023-09-29 ENCOUNTER — Encounter (HOSPITAL_COMMUNITY): Payer: Self-pay

## 2023-09-29 DIAGNOSIS — Z880 Allergy status to penicillin: Secondary | ICD-10-CM | POA: Diagnosis not present

## 2023-09-29 DIAGNOSIS — Z90710 Acquired absence of both cervix and uterus: Secondary | ICD-10-CM | POA: Diagnosis not present

## 2023-09-29 DIAGNOSIS — Z79899 Other long term (current) drug therapy: Secondary | ICD-10-CM | POA: Diagnosis not present

## 2023-09-29 DIAGNOSIS — M00872 Arthritis due to other bacteria, left ankle and foot: Secondary | ICD-10-CM | POA: Diagnosis not present

## 2023-09-29 DIAGNOSIS — M109 Gout, unspecified: Secondary | ICD-10-CM | POA: Diagnosis present

## 2023-09-29 DIAGNOSIS — L97519 Non-pressure chronic ulcer of other part of right foot with unspecified severity: Secondary | ICD-10-CM | POA: Diagnosis present

## 2023-09-29 DIAGNOSIS — M00272 Other streptococcal arthritis, left ankle and foot: Secondary | ICD-10-CM | POA: Diagnosis not present

## 2023-09-29 DIAGNOSIS — D696 Thrombocytopenia, unspecified: Secondary | ICD-10-CM | POA: Diagnosis present

## 2023-09-29 DIAGNOSIS — Z981 Arthrodesis status: Secondary | ICD-10-CM | POA: Diagnosis not present

## 2023-09-29 DIAGNOSIS — E441 Mild protein-calorie malnutrition: Secondary | ICD-10-CM | POA: Diagnosis present

## 2023-09-29 DIAGNOSIS — E871 Hypo-osmolality and hyponatremia: Secondary | ICD-10-CM | POA: Diagnosis present

## 2023-09-29 DIAGNOSIS — E876 Hypokalemia: Secondary | ICD-10-CM | POA: Diagnosis present

## 2023-09-29 DIAGNOSIS — R7303 Prediabetes: Secondary | ICD-10-CM | POA: Diagnosis present

## 2023-09-29 DIAGNOSIS — I129 Hypertensive chronic kidney disease with stage 1 through stage 4 chronic kidney disease, or unspecified chronic kidney disease: Secondary | ICD-10-CM | POA: Diagnosis present

## 2023-09-29 DIAGNOSIS — E861 Hypovolemia: Secondary | ICD-10-CM | POA: Diagnosis present

## 2023-09-29 DIAGNOSIS — Z9049 Acquired absence of other specified parts of digestive tract: Secondary | ICD-10-CM | POA: Diagnosis not present

## 2023-09-29 DIAGNOSIS — Z6836 Body mass index (BMI) 36.0-36.9, adult: Secondary | ICD-10-CM | POA: Diagnosis not present

## 2023-09-29 DIAGNOSIS — S91301A Unspecified open wound, right foot, initial encounter: Secondary | ICD-10-CM | POA: Diagnosis present

## 2023-09-29 DIAGNOSIS — R7401 Elevation of levels of liver transaminase levels: Secondary | ICD-10-CM | POA: Diagnosis present

## 2023-09-29 DIAGNOSIS — B958 Unspecified staphylococcus as the cause of diseases classified elsewhere: Secondary | ICD-10-CM | POA: Diagnosis not present

## 2023-09-29 DIAGNOSIS — E782 Mixed hyperlipidemia: Secondary | ICD-10-CM | POA: Diagnosis present

## 2023-09-29 DIAGNOSIS — X58XXXA Exposure to other specified factors, initial encounter: Secondary | ICD-10-CM | POA: Diagnosis present

## 2023-09-29 DIAGNOSIS — M009 Pyogenic arthritis, unspecified: Secondary | ICD-10-CM | POA: Diagnosis not present

## 2023-09-29 DIAGNOSIS — Z8249 Family history of ischemic heart disease and other diseases of the circulatory system: Secondary | ICD-10-CM | POA: Diagnosis not present

## 2023-09-29 DIAGNOSIS — E669 Obesity, unspecified: Secondary | ICD-10-CM | POA: Diagnosis present

## 2023-09-29 DIAGNOSIS — B961 Klebsiella pneumoniae [K. pneumoniae] as the cause of diseases classified elsewhere: Secondary | ICD-10-CM | POA: Diagnosis present

## 2023-09-29 DIAGNOSIS — K760 Fatty (change of) liver, not elsewhere classified: Secondary | ICD-10-CM | POA: Diagnosis present

## 2023-09-29 DIAGNOSIS — K219 Gastro-esophageal reflux disease without esophagitis: Secondary | ICD-10-CM | POA: Diagnosis present

## 2023-09-29 DIAGNOSIS — M00072 Staphylococcal arthritis, left ankle and foot: Secondary | ICD-10-CM | POA: Diagnosis present

## 2023-09-29 DIAGNOSIS — B9561 Methicillin susceptible Staphylococcus aureus infection as the cause of diseases classified elsewhere: Secondary | ICD-10-CM | POA: Diagnosis present

## 2023-09-29 LAB — SYNOVIAL CELL COUNT + DIFF, W/ CRYSTALS
Crystals, Fluid: NONE SEEN
Lymphocytes-Synovial Fld: 11 % (ref 0–20)
Monocyte-Macrophage-Synovial Fluid: 15 % — ABNORMAL LOW (ref 50–90)
Neutrophil, Synovial: 74 % — ABNORMAL HIGH (ref 0–25)
WBC, Synovial: 3350 /mm3 — ABNORMAL HIGH (ref 0–200)

## 2023-09-29 LAB — SURGICAL PCR SCREEN
MRSA, PCR: NEGATIVE
Staphylococcus aureus: POSITIVE — AB

## 2023-09-29 LAB — C-REACTIVE PROTEIN: CRP: 36.7 mg/dL — ABNORMAL HIGH (ref <1.0)

## 2023-09-29 SURGERY — INCISION AND DRAINAGE OF DEEP ABSCESS, ANKLE
Anesthesia: General | Laterality: Left

## 2023-09-29 MED ORDER — SODIUM CHLORIDE 0.9 % IR SOLN
Status: DC | PRN
  Administered 2023-09-29: 3000 mL

## 2023-09-29 MED ORDER — MIDAZOLAM HCL 5 MG/5ML IJ SOLN
INTRAMUSCULAR | Status: DC | PRN
  Administered 2023-09-29: 2 mg via INTRAVENOUS

## 2023-09-29 MED ORDER — FENTANYL CITRATE (PF) 100 MCG/2ML IJ SOLN
INTRAMUSCULAR | Status: DC | PRN
  Administered 2023-09-29 (×2): 50 ug via INTRAVENOUS

## 2023-09-29 MED ORDER — SODIUM CHLORIDE (PF) 0.9 % IJ SOLN
INTRAMUSCULAR | Status: AC
  Filled 2023-09-29: qty 20

## 2023-09-29 MED ORDER — MUPIROCIN 2 % EX OINT
1.0000 | TOPICAL_OINTMENT | Freq: Two times a day (BID) | CUTANEOUS | Status: AC
  Administered 2023-09-29 – 2023-10-04 (×10): 1 via NASAL
  Filled 2023-09-29: qty 22

## 2023-09-29 MED ORDER — ATENOLOL 50 MG PO TABS
100.0000 mg | ORAL_TABLET | Freq: Every day | ORAL | Status: DC
  Administered 2023-09-29 – 2023-10-03 (×5): 100 mg via ORAL
  Filled 2023-09-29 (×5): qty 2

## 2023-09-29 MED ORDER — ENOXAPARIN SODIUM 30 MG/0.3ML IJ SOSY
30.0000 mg | PREFILLED_SYRINGE | INTRAMUSCULAR | Status: DC
  Administered 2023-09-30: 30 mg via SUBCUTANEOUS
  Filled 2023-09-29: qty 0.3

## 2023-09-29 MED ORDER — METOCLOPRAMIDE HCL 5 MG/ML IJ SOLN: 5.0000 mg | Freq: Three times a day (TID) | INTRAMUSCULAR | Status: DC | PRN

## 2023-09-29 MED ORDER — POTASSIUM CHLORIDE IN NACL 40-0.9 MEQ/L-% IV SOLN
INTRAVENOUS | Status: AC
  Filled 2023-09-29: qty 1000

## 2023-09-29 MED ORDER — ONDANSETRON HCL 4 MG PO TABS: 4.0000 mg | ORAL_TABLET | Freq: Four times a day (QID) | ORAL | Status: DC | PRN

## 2023-09-29 MED ORDER — LIDOCAINE HCL (CARDIAC) PF 100 MG/5ML IV SOSY
PREFILLED_SYRINGE | INTRAVENOUS | Status: DC | PRN
  Administered 2023-09-29: 60 mg via INTRAVENOUS

## 2023-09-29 MED ORDER — GABAPENTIN 300 MG PO CAPS
300.0000 mg | ORAL_CAPSULE | Freq: Every day | ORAL | Status: DC
  Administered 2023-09-30 – 2023-10-04 (×5): 300 mg via ORAL
  Filled 2023-09-29 (×5): qty 1

## 2023-09-29 MED ORDER — NIACIN ER (ANTIHYPERLIPIDEMIC) 500 MG PO TBCR
2000.0000 mg | EXTENDED_RELEASE_TABLET | Freq: Every day | ORAL | Status: DC
  Administered 2023-09-29 – 2023-10-03 (×5): 2000 mg via ORAL
  Filled 2023-09-29 (×5): qty 4

## 2023-09-29 MED ORDER — VANCOMYCIN HCL 2000 MG/400ML IV SOLN
2000.0000 mg | Freq: Once | INTRAVENOUS | Status: AC
  Administered 2023-09-29: 2000 mg via INTRAVENOUS
  Filled 2023-09-29: qty 400

## 2023-09-29 MED ORDER — VITAMIN D 25 MCG (1000 UNIT) PO TABS
2000.0000 [IU] | ORAL_TABLET | Freq: Every day | ORAL | Status: DC
  Administered 2023-09-30 – 2023-10-04 (×5): 2000 [IU] via ORAL
  Filled 2023-09-29 (×5): qty 2

## 2023-09-29 MED ORDER — ACETAMINOPHEN 325 MG PO TABS: 650.0000 mg | ORAL_TABLET | Freq: Four times a day (QID) | ORAL | Status: DC | PRN

## 2023-09-29 MED ORDER — POTASSIUM CHLORIDE CRYS ER 20 MEQ PO TBCR
40.0000 meq | EXTENDED_RELEASE_TABLET | Freq: Once | ORAL | Status: AC
  Administered 2023-09-29: 40 meq via ORAL
  Filled 2023-09-29: qty 2

## 2023-09-29 MED ORDER — ONDANSETRON HCL 4 MG/2ML IJ SOLN: 4.0000 mg | Freq: Once | INTRAMUSCULAR | Status: DC | PRN

## 2023-09-29 MED ORDER — VANCOMYCIN VARIABLE DOSE PER UNSTABLE RENAL FUNCTION (PHARMACIST DOSING): Status: DC

## 2023-09-29 MED ORDER — CHLORHEXIDINE GLUCONATE 0.12 % MT SOLN
15.0000 mL | Freq: Once | OROMUCOSAL | Status: AC
  Administered 2023-09-29: 15 mL via OROMUCOSAL
  Filled 2023-09-29: qty 15

## 2023-09-29 MED ORDER — OMEGA-3-ACID ETHYL ESTERS 1 G PO CAPS
2.0000 g | ORAL_CAPSULE | Freq: Two times a day (BID) | ORAL | Status: DC
  Administered 2023-09-29 – 2023-10-04 (×10): 2 g via ORAL
  Filled 2023-09-29 (×10): qty 2

## 2023-09-29 MED ORDER — CHLORHEXIDINE GLUCONATE CLOTH 2 % EX PADS: 6.0000 | MEDICATED_PAD | Freq: Every day | CUTANEOUS | Status: DC

## 2023-09-29 MED ORDER — PROPOFOL 10 MG/ML IV BOLUS
INTRAVENOUS | Status: DC | PRN
  Administered 2023-09-29: 150 mg via INTRAVENOUS

## 2023-09-29 MED ORDER — AMISULPRIDE (ANTIEMETIC) 5 MG/2ML IV SOLN: 10.0000 mg | Freq: Once | INTRAVENOUS | Status: DC | PRN

## 2023-09-29 MED ORDER — MORPHINE SULFATE (PF) 4 MG/ML IV SOLN
4.0000 mg | INTRAVENOUS | Status: DC | PRN
  Administered 2023-09-29 – 2023-09-30 (×4): 4 mg via INTRAVENOUS
  Filled 2023-09-29 (×4): qty 1

## 2023-09-29 MED ORDER — FENTANYL CITRATE PF 50 MCG/ML IJ SOSY
PREFILLED_SYRINGE | INTRAMUSCULAR | Status: AC
  Administered 2023-09-29: 25 ug via INTRAVENOUS
  Filled 2023-09-29: qty 1

## 2023-09-29 MED ORDER — ALLOPURINOL 300 MG PO TABS
300.0000 mg | ORAL_TABLET | Freq: Every day | ORAL | Status: DC
  Administered 2023-09-30 – 2023-10-04 (×5): 300 mg via ORAL
  Filled 2023-09-29 (×5): qty 1

## 2023-09-29 MED ORDER — ONDANSETRON HCL 4 MG/2ML IJ SOLN: 4.0000 mg | Freq: Four times a day (QID) | INTRAMUSCULAR | Status: DC | PRN

## 2023-09-29 MED ORDER — PANTOPRAZOLE SODIUM 40 MG PO TBEC
40.0000 mg | DELAYED_RELEASE_TABLET | Freq: Every day | ORAL | Status: DC
  Administered 2023-09-30 – 2023-10-04 (×5): 40 mg via ORAL
  Filled 2023-09-29 (×6): qty 1

## 2023-09-29 MED ORDER — CEFAZOLIN SODIUM-DEXTROSE 2-3 GM-%(50ML) IV SOLR
INTRAVENOUS | Status: DC | PRN
  Administered 2023-09-29: 2 g via INTRAVENOUS

## 2023-09-29 MED ORDER — ONDANSETRON HCL 4 MG/2ML IJ SOLN
INTRAMUSCULAR | Status: DC | PRN
  Administered 2023-09-29: 4 mg via INTRAVENOUS

## 2023-09-29 MED ORDER — ACETAMINOPHEN 10 MG/ML IV SOLN
INTRAVENOUS | Status: AC
  Administered 2023-09-29: 1000 mg via INTRAVENOUS
  Filled 2023-09-29: qty 100

## 2023-09-29 MED ORDER — LACTATED RINGERS IV SOLN: INTRAVENOUS | Status: DC

## 2023-09-29 MED ORDER — ENOXAPARIN SODIUM 60 MG/0.6ML IJ SOSY: 50.0000 mg | PREFILLED_SYRINGE | INTRAMUSCULAR | Status: DC

## 2023-09-29 MED ORDER — CEFAZOLIN SODIUM 1 G IJ SOLR
INTRAMUSCULAR | Status: AC
  Filled 2023-09-29: qty 20

## 2023-09-29 MED ORDER — MIDAZOLAM HCL 2 MG/2ML IJ SOLN
INTRAMUSCULAR | Status: AC
Start: 2023-09-29 — End: ?
  Filled 2023-09-29: qty 2

## 2023-09-29 MED ORDER — DEXAMETHASONE SODIUM PHOSPHATE 4 MG/ML IJ SOLN
INTRAMUSCULAR | Status: DC | PRN
  Administered 2023-09-29: 5 mg via INTRAVENOUS

## 2023-09-29 MED ORDER — GABAPENTIN 300 MG PO CAPS
900.0000 mg | ORAL_CAPSULE | Freq: Every day | ORAL | Status: DC
  Administered 2023-09-29 – 2023-10-03 (×5): 900 mg via ORAL
  Filled 2023-09-29 (×5): qty 3

## 2023-09-29 MED ORDER — DOCUSATE SODIUM 100 MG PO CAPS: 200.0000 mg | ORAL_CAPSULE | Freq: Every day | ORAL | Status: DC

## 2023-09-29 MED ORDER — AMLODIPINE BESYLATE 5 MG PO TABS: 5.0000 mg | ORAL_TABLET | Freq: Every day | ORAL | Status: DC

## 2023-09-29 MED ORDER — VANCOMYCIN HCL IN DEXTROSE 1-5 GM/200ML-% IV SOLN
1000.0000 mg | Freq: Once | INTRAVENOUS | Status: AC
  Administered 2023-09-29: 1000 mg via INTRAVENOUS
  Filled 2023-09-29: qty 200

## 2023-09-29 MED ORDER — FENTANYL CITRATE (PF) 100 MCG/2ML IJ SOLN
INTRAMUSCULAR | Status: AC
  Filled 2023-09-29: qty 2

## 2023-09-29 MED ORDER — PROPOFOL 10 MG/ML IV BOLUS
INTRAVENOUS | Status: AC
  Filled 2023-09-29: qty 20

## 2023-09-29 MED ORDER — BUPIVACAINE-EPINEPHRINE (PF) 0.5% -1:200000 IJ SOLN
INTRAMUSCULAR | Status: AC
  Filled 2023-09-29: qty 30

## 2023-09-29 MED ORDER — GABAPENTIN 300 MG PO CAPS: 300.0000 mg | ORAL_CAPSULE | ORAL | Status: DC

## 2023-09-29 MED ORDER — FENTANYL CITRATE PF 50 MCG/ML IJ SOSY
25.0000 ug | PREFILLED_SYRINGE | INTRAMUSCULAR | Status: DC | PRN
  Administered 2023-09-29: 25 ug via INTRAVENOUS

## 2023-09-29 MED ORDER — METOCLOPRAMIDE HCL 5 MG PO TABS: 5.0000 mg | ORAL_TABLET | Freq: Three times a day (TID) | ORAL | Status: DC | PRN

## 2023-09-29 MED ORDER — BUPIVACAINE LIPOSOME 1.3 % IJ SUSP
INTRAMUSCULAR | Status: DC | PRN
  Administered 2023-09-29: 20 mL

## 2023-09-29 MED ORDER — OXYCODONE HCL 5 MG PO TABS
5.0000 mg | ORAL_TABLET | ORAL | Status: DC | PRN
  Administered 2023-09-30 – 2023-10-04 (×11): 5 mg via ORAL
  Filled 2023-09-29 (×12): qty 1

## 2023-09-29 MED ORDER — ACETAMINOPHEN 10 MG/ML IV SOLN: 1000.0000 mg | Freq: Once | INTRAVENOUS | Status: DC | PRN

## 2023-09-29 MED ORDER — CHLORHEXIDINE GLUCONATE CLOTH 2 % EX PADS
6.0000 | MEDICATED_PAD | Freq: Every day | CUTANEOUS | Status: AC
  Administered 2023-09-29 – 2023-10-03 (×4): 6 via TOPICAL

## 2023-09-29 MED ORDER — DOCUSATE SODIUM 100 MG PO CAPS
100.0000 mg | ORAL_CAPSULE | Freq: Two times a day (BID) | ORAL | Status: DC
Start: 2023-09-29 — End: 2023-10-04
  Administered 2023-09-29 – 2023-10-04 (×10): 100 mg via ORAL
  Filled 2023-09-29 (×10): qty 1

## 2023-09-29 MED ORDER — ACETAMINOPHEN 650 MG RE SUPP: 650.0000 mg | Freq: Four times a day (QID) | RECTAL | Status: DC | PRN

## 2023-09-29 SURGICAL SUPPLY — 56 items
ANCHOR SUT KEITH ABD SZ2 STR (SUTURE) ×1 IMPLANT
BAG COUNTER SPONGE SURGICOUNT (BAG) IMPLANT
BAG ZIPLOCK 12X15 (MISCELLANEOUS) ×1 IMPLANT
BNDG ELASTIC 4INX 5YD STR LF (GAUZE/BANDAGES/DRESSINGS) IMPLANT
BNDG ELASTIC 6INX 5YD STR LF (GAUZE/BANDAGES/DRESSINGS) ×1 IMPLANT
BNDG ESMARK 4X9 LF (GAUZE/BANDAGES/DRESSINGS) ×1 IMPLANT
BNDG STRETCH GAUZE 3IN X12FT (GAUZE/BANDAGES/DRESSINGS) ×1 IMPLANT
CHLORAPREP W/TINT 26 (MISCELLANEOUS) IMPLANT
COVER SURGICAL LIGHT HANDLE (MISCELLANEOUS) ×1 IMPLANT
CUFF TRNQT CYL 34X4.125X (TOURNIQUET CUFF) ×1 IMPLANT
DRAPE C-ARM 42X120 X-RAY (DRAPES) IMPLANT
DRAPE OEC MINIVIEW 54X84 (DRAPES) IMPLANT
DRAPE SHEET LG 3/4 BI-LAMINATE (DRAPES) ×1 IMPLANT
DRAPE U-SHAPE 47X51 STRL (DRAPES) ×1 IMPLANT
DRSG ADAPTIC 3X8 NADH LF (GAUZE/BANDAGES/DRESSINGS) ×1 IMPLANT
DRSG AQUACEL AG ADV 3.5X 6 (GAUZE/BANDAGES/DRESSINGS) IMPLANT
DURAPREP 26ML APPLICATOR (WOUND CARE) ×1 IMPLANT
ELECT REM PT RETURN 15FT ADLT (MISCELLANEOUS) ×1 IMPLANT
EVACUATOR DRAINAGE 7X20 100CC (MISCELLANEOUS) IMPLANT
FACESHIELD WRAPAROUND (MASK) IMPLANT
FACESHIELD WRAPAROUND OR TEAM (MASK) ×1 IMPLANT
GAUZE PAD ABD 8X10 STRL (GAUZE/BANDAGES/DRESSINGS) ×1 IMPLANT
GAUZE SPONGE 4X4 12PLY STRL (GAUZE/BANDAGES/DRESSINGS) ×1 IMPLANT
GLOVE BIOGEL PI IND STRL 8 (GLOVE) ×1 IMPLANT
GLOVE ECLIPSE 8.0 STRL XLNG CF (GLOVE) ×2 IMPLANT
GLOVE ORTHO TXT STRL SZ7.5 (GLOVE) ×1 IMPLANT
KIT BASIN OR (CUSTOM PROCEDURE TRAY) ×1 IMPLANT
KIT TURNOVER KIT A (KITS) IMPLANT
MARKER SKIN DUAL TIP RULER LAB (MISCELLANEOUS) IMPLANT
NDL HYPO 22X1.5 SAFETY MO (MISCELLANEOUS) ×1 IMPLANT
NDL MAYO CATGUT SZ4 TPR NDL (NEEDLE) ×1 IMPLANT
NEEDLE HYPO 22X1.5 SAFETY MO (MISCELLANEOUS) ×1 IMPLANT
NEEDLE MAYO CATGUT SZ4 (NEEDLE) IMPLANT
NS IRRIG 1000ML POUR BTL (IV SOLUTION) ×1 IMPLANT
PACK ORTHO EXTREMITY (CUSTOM PROCEDURE TRAY) ×1 IMPLANT
PACKING GAUZE IODOFORM 1INX5YD (GAUZE/BANDAGES/DRESSINGS) ×1 IMPLANT
PAD CAST 4YDX4 CTTN HI CHSV (CAST SUPPLIES) ×2 IMPLANT
PENCIL SMOKE EVACUATOR (MISCELLANEOUS) IMPLANT
PROTECTOR NERVE ULNAR (MISCELLANEOUS) ×1 IMPLANT
SPIKE FLUID TRANSFER (MISCELLANEOUS) ×1 IMPLANT
SPONGE T-LAP 4X18 ~~LOC~~+RFID (SPONGE) ×2 IMPLANT
STAPLER SKIN PROX WIDE 3.9 (STAPLE) ×1 IMPLANT
STOCKINETTE 4X48 STRL (DRAPES) IMPLANT
STRIP CLOSURE SKIN 1/2X4 (GAUZE/BANDAGES/DRESSINGS) ×1 IMPLANT
SUCTION TUBE FRAZIER 10FR DISP (SUCTIONS) IMPLANT
SUT ETHILON 2 0 PS N (SUTURE) ×2 IMPLANT
SUT ETHILON 3 0 PS 1 (SUTURE) IMPLANT
SUT MNCRL AB 3-0 PS2 18 (SUTURE) IMPLANT
SUT PROLENE 3 0 PS 2 (SUTURE) ×1 IMPLANT
SUT VIC AB 0 CT1 36 (SUTURE) IMPLANT
SUT VIC AB 2-0 CT1 TAPERPNT 27 (SUTURE) ×1 IMPLANT
SUT VIC AB 3-0 PS2 18XBRD (SUTURE) ×1 IMPLANT
SWAB COLLECTION DEVICE MRSA (MISCELLANEOUS) ×1 IMPLANT
SWAB CULTURE ESWAB REG 1ML (MISCELLANEOUS) ×1 IMPLANT
SYR CONTROL 10ML LL (SYRINGE) ×1 IMPLANT
WATER STERILE IRR 1000ML POUR (IV SOLUTION) ×1 IMPLANT

## 2023-09-29 NOTE — Anesthesia Preprocedure Evaluation (Addendum)
Anesthesia Evaluation  Patient identified by MRN, date of birth, ID band Patient awake    Reviewed: Allergy & Precautions, NPO status , Patient's Chart, lab work & pertinent test results  Airway Mallampati: II  TM Distance: >3 FB Neck ROM: Full    Dental no notable dental hx.    Pulmonary neg pulmonary ROS   Pulmonary exam normal        Cardiovascular hypertension, Pt. on medications and Pt. on home beta blockers Normal cardiovascular exam     Neuro/Psych  Neuromuscular disease  negative psych ROS   GI/Hepatic Neg liver ROS,GERD  Medicated and Controlled,,  Endo/Other  Patient on GLP-1 Agonist  Renal/GU Renal disease     Musculoskeletal  (+) Arthritis ,  Gout   Abdominal  (+) + obese  Peds  Hematology negative hematology ROS (+)   Anesthesia Other Findings SEPTIC ARTHRITIS LEFT ANKLE  Reproductive/Obstetrics                             Anesthesia Physical Anesthesia Plan  ASA: 3 and emergent  Anesthesia Plan: General   Post-op Pain Management:    Induction: Intravenous  PONV Risk Score and Plan: 3 and Ondansetron, Dexamethasone and Treatment may vary due to age or medical condition  Airway Management Planned: LMA  Additional Equipment:   Intra-op Plan:   Post-operative Plan: Extubation in OR  Informed Consent: I have reviewed the patients History and Physical, chart, labs and discussed the procedure including the risks, benefits and alternatives for the proposed anesthesia with the patient or authorized representative who has indicated his/her understanding and acceptance.     Dental advisory given  Plan Discussed with: CRNA  Anesthesia Plan Comments:        Anesthesia Quick Evaluation

## 2023-09-29 NOTE — Progress Notes (Signed)
Pharmacy Antibiotic Note  Evelyn Campbell is a 67 y.o. female admitted on 09/28/2023 with cellulitis.  Pharmacy has been consulted for Vanco dosing.  Active Problem(s): left ankle pain and swelling for the last couple of days, Her ankle was aspirated in the emergency department last night.   ID: Cellulitis L ank with soft tissue swelling and effusion. R/o septic arthritis and/or osteo - aspirate showed an increased white blood cell count as well as bacteria on Gram stain  - Afebrile, WBC 17.1, Scr 0.97  Vanco 11/28>>  Plan: Vanco 1g given in ED 0400 Vanco 2g give in ED 0900  Vanco random in AM to assess excretion before further dosing. Plan urgent debridement of the joint.    Height: 5\' 5"  (165.1 cm) Weight: 100.7 kg (222 lb) IBW/kg (Calculated) : 57  Temp (24hrs), Avg:98.3 F (36.8 C), Min:97.9 F (36.6 C), Max:98.7 F (37.1 C)  Recent Labs  Lab 09/28/23 2216  WBC 17.1*  CREATININE 0.97    Estimated Creatinine Clearance: 64.4 mL/min (by C-G formula based on SCr of 0.97 mg/dL).    Allergies  Allergen Reactions   Celebrex [Celecoxib] Diarrhea and Other (See Comments)    Cause bloody stools   Ibuprofen Diarrhea and Other (See Comments)    Caused bloody stools   Meloxicam Diarrhea and Other (See Comments)    Caused bloody stools   Amoxicillin-Pot Clavulanate    Lipitor [Atorvastatin] Other (See Comments)    myalgia   Nsaids     Other reaction(s): Unknown   Statins Other (See Comments)    myalgia   Amoxicillin Itching and Rash   Penicillins Rash    Jayde Mcallister S. Merilynn Finland, PharmD, BCPS Clinical Staff Pharmacist [email or web address]  Pasty Spillers 09/29/2023 11:20 AM

## 2023-09-29 NOTE — Anesthesia Procedure Notes (Signed)
Procedure Name: LMA Insertion Date/Time: 09/29/2023 5:45 PM  Performed by: Nathen May, CRNAPre-anesthesia Checklist: Patient identified, Emergency Drugs available, Suction available and Patient being monitored Patient Re-evaluated:Patient Re-evaluated prior to induction Oxygen Delivery Method: Circle System Utilized Preoxygenation: Pre-oxygenation with 100% oxygen Induction Type: IV induction Ventilation: Mask ventilation without difficulty LMA: LMA inserted LMA Size: 4.0 Number of attempts: 1 Airway Equipment and Method: Bite block Placement Confirmation: positive ETCO2 Tube secured with: Tape Dental Injury: Teeth and Oropharynx as per pre-operative assessment

## 2023-09-29 NOTE — Transfer of Care (Signed)
Immediate Anesthesia Transfer of Care Note  Patient: Evelyn Campbell  Procedure(s) Performed: INCISION AND DRAINAGE LEFT ANKLE (Left)  Patient Location: PACU  Anesthesia Type:General  Level of Consciousness: awake, alert , and oriented  Airway & Oxygen Therapy: Patient Spontanous Breathing and Patient connected to nasal cannula oxygen  Post-op Assessment: Report given to RN and Post -op Vital signs reviewed and stable  Post vital signs: Reviewed and stable  Last Vitals:  Vitals Value Taken Time  BP 109/71 09/29/23 1840  Temp 36.4 C 09/29/23 1840  Pulse 77 09/29/23 1843  Resp 15 09/29/23 1843  SpO2 95 % 09/29/23 1843  Vitals shown include unfiled device data.  Last Pain:  Vitals:   09/29/23 1840  TempSrc: Oral  PainSc:       Patients Stated Pain Goal: 5 (09/29/23 1617)  Complications: No notable events documented.

## 2023-09-29 NOTE — ED Notes (Signed)
.ED TO INPATIENT HANDOFF REPORT  Name/Age/Gender Evelyn Campbell 68 y.o. female  Code Status Code Status History     Date Active Date Inactive Code Status Order ID Comments User Context   07/02/2015 1337 07/04/2015 1919 Full Code [id number]  Abigail Miyamoto, MD Inpatient   08/20/2014 1816 08/24/2014 1407 Full Code [id number]  Almond Lint, MD Inpatient      Advance Directive Documentation    Flowsheet Row Most Recent Value  Type of Advance Directive Healthcare Power of Attorney  Pre-existing out of facility DNR order (yellow form or pink MOST form) --  "MOST" Form in Place? --       Home/SNF/Other Home  Chief Complaint Septic arthritis of ankle (HCC) [M00.9]  Level of Care/Admitting Diagnosis ED Disposition     ED Disposition  Admit   Condition  --   Comment  Hospital Area: [facility] [[id number]]  Level of Care: Med-Surg [16]  May admit patient to [facility] or [facility] if equivalent level of care is available:: Yes  Covid Evaluation: Asymptomatic - no recent exposure (last 10 days) testing not required  Diagnosis: Septic arthritis of ankle [facility]) [347425]  Admitting Physician: [name] [[id number]]  Attending Physician: [name] [[id number]]  Certification:: I certify this patient will need inpatient services for at least 2 midnights  Expected Medical Readiness: 10/02/2023          Medical History Past Medical History:  Diagnosis Date   Arthritis    R ankle    Back pain    Chronic kidney disease    seen by Nephrology at [facility], due to all her numbers were"off", she  remarks that she was sent to superhydrate & then her labs values corrected.  Pt. will f/u /w nephrology   Constipation    Fatty liver    Gallbladder problem    GERD (gastroesophageal reflux disease)    occ   Gout    Hyperlipidemia    Hypertension    Joint pain    Lower extremity edema    Obesity    Peripheral neuropathy    Pneumonia 1980's   hx- not  needed hospitalization    Vitamin D deficiency     Allergies Allergies  Allergen Reactions   Celebrex [Celecoxib] Diarrhea and Other (See Comments)    Cause bloody stools   Ibuprofen Diarrhea and Other (See Comments)    Caused bloody stools   Meloxicam Diarrhea and Other (See Comments)    Caused bloody stools   Amoxicillin-Pot Clavulanate    Lipitor [Atorvastatin] Other (See Comments)    myalgia   Nsaids     Other reaction(s): Unknown   Statins Other (See Comments)    myalgia   Amoxicillin Itching and Rash   Penicillins Rash    IV Location/Drains/Wounds Patient Lines/Drains/Airways Status     Active Line/Drains/Airways     Name Placement date Placement time Site Days   Peripheral IV 09/28/23 22 G Posterior;Right Hand 09/28/23  2354  Hand  1   Incision (Closed) 08/20/14 Abdomen Other (Comment) 08/20/14  1512  -- 3327   Incision (Closed) 07/02/15 Abdomen Other (Comment) 07/02/15  1118  -- 3011   Incision - 4 Ports Abdomen 1: Left;Upper 2: Left;Lower 3: Left;Lower;Lateral 4: Left;Lower;Medial 08/20/14  1450  -- 3327   Incision - 3 Ports Abdomen 1: Lower;Umbilicus 2: Mid;Lower 3: Lower 08/20/14  1520  -- 3327            Labs/Imaging Results for orders  placed or performed during the hospital encounter of 09/28/23 (from the past 48 hour(s))  CBC with Differential     Status: Abnormal   Collection Time: 09/28/23 10:16 PM  Result Value Ref Range   WBC 17.1 (H) 4.0 - 10.5 K/uL   RBC 4.15 3.87 - 5.11 MIL/uL   Hemoglobin 12.4 12.0 - 15.0 g/dL   HCT 32.9 51.8 - 84.1 %   MCV 89.2 80.0 - 100.0 fL   MCH 29.9 26.0 - 34.0 pg   MCHC 33.5 30.0 - 36.0 g/dL   RDW 66.0 63.0 - 16.0 %   Platelets 118 (L) 150 - 400 K/uL   nRBC 0.0 0.0 - 0.2 %   Neutrophils Relative % 81 %   Neutro Abs 13.9 (H) 1.7 - 7.7 K/uL   Lymphocytes Relative 7 %   Lymphs Abs 1.2 0.7 - 4.0 K/uL   Monocytes Relative 11 %   Monocytes Absolute 1.8 (H) 0.1 - 1.0 K/uL   Eosinophils Relative 0 %   Eosinophils  Absolute 0.0 0.0 - 0.5 K/uL   Basophils Relative 0 %   Basophils Absolute 0.0 0.0 - 0.1 K/uL   Immature Granulocytes 1 %   Abs Immature Granulocytes 0.12 (H) 0.00 - 0.07 K/uL    Comment: Performed at [facility], [address], [place], [place] [address]  Comprehensive metabolic panel     Status: Abnormal   Collection Time: 09/28/23 10:16 PM  Result Value Ref Range   Sodium 131 (L) 135 - 145 mmol/L   Potassium 3.2 (L) 3.5 - 5.1 mmol/L   Chloride 96 (L) 98 - 111 mmol/L   CO2 24 22 - 32 mmol/L   Glucose, Bld 114 (H) 70 - 99 mg/dL    Comment: Glucose reference range applies only to samples taken after fasting for at least 8 hours.   BUN 23 8 - 23 mg/dL   Creatinine, Ser 3.55 0.44 - 1.00 mg/dL   Calcium 9.3 8.9 - 73.2 mg/dL   Total Protein 6.4 (L) 6.5 - 8.1 g/dL   Albumin 3.2 (L) 3.5 - 5.0 g/dL   AST 51 (H) 15 - 41 U/L   ALT 54 (H) 0 - 44 U/L   Alkaline Phosphatase 105 38 - 126 U/L   Total Bilirubin 1.4 (H) <1.2 mg/dL   GFR, Estimated >20 >25 mL/min    Comment: (NOTE) Calculated using the CKD-EPI Creatinine Equation (2021)    Anion gap 11 5 - 15    Comment: Performed at [facility], [address] [place], [place] [address]  Sedimentation rate     Status: Abnormal   Collection Time: 09/28/23 10:16 PM  Result Value Ref Range   Sed Rate 39 (H) 0 - 22 mm/hr    Comment: Performed at [facility], [address] [place], [place] [address]  Body fluid culture w Gram Stain     Status: None (Preliminary result)   Collection Time: 09/28/23 10:36 PM   Specimen: Ankle; Body Fluid  Result Value Ref Range   Specimen Description      ANKLE Performed at [facility], [address] [place], [place] [address]    Special Requests      NONE Performed at [facility], [address], [place], [place] [address]    Gram Stain      MODERATE WBC PRESENT,BOTH PMN AND MONONUCLEAR FEW GRAM POSITIVE  COCCI Performed at [facility], [address],  [place], [place] [address]    Culture PENDING    Report Status PENDING   Synovial cell count + diff, w/ crystals     Status: Abnormal   Collection Time: 09/28/23 10:36 PM  Result Value Ref Range   Color, Synovial RED (A) YELLOW   Appearance-Synovial TURBID (A) CLEAR   Crystals, Fluid NONE SEEN    WBC, Synovial 3,350 (H) 0 - 200 /cu mm   Neutrophil, Synovial 74 (H) 0 - 25 %   Lymphocytes-Synovial Fld 11 0 - 20 %   Monocyte-Macrophage-Synovial Fluid 15 (L) 50 - 90 %    Comment: Performed at [facility], [address], [place], [place] [address]   DG Ankle Complete Left  Result Date: 09/29/2023 CLINICAL DATA:  Left ankle injury, swelling EXAM: LEFT ANKLE COMPLETE - 3+ VIEW COMPARISON:  None Available. FINDINGS: There is destruction of the talar neck and anterior process of the calcaneus as well as the tarsal bones mid with extensive erosion is identified. While initially changes noted on prior examination may have related to Charcot joint, extensive erosions favor inflammatory process such as osteomyelitis. Extensive surrounding soft tissue swelling is present. Erosions now present distal tibia, distal fibula, and talar dome. IMPRESSION: 1. Extensive erosions with destruction of the distal talus, anterior process calcaneus, and the tarsal bones of the midfoot. Erosions now present distal tibia, distal fibula, and talar dome. An inflammatory process such as osteomyelitis is favored and clinical correlation is suggested. Electronically Signed   By: Helyn Numbers M.D.   On: 09/29/2023 00:58    Pending Labs Unresulted Labs (From admission, onward)     Start     Ordered   09/28/23 2138  Uric Acid, Body Fluid  (Arthrocentesis Panel)  ONCE - STAT,   URGENT        09/28/23 2137   09/28/23 2138  C-reactive protein  Once,   URGENT        09/28/23 2137   09/28/23 2137  Glucose, Body Fluid Other  (Arthrocentesis Panel)  ONCE  - STAT,   URGENT        09/28/23 2137   09/28/23 2137  Protein, body fluid (other)  (Arthrocentesis Panel)  ONCE - STAT,   URGENT        09/28/23 2137            Vitals/Pain Today's Vitals   09/28/23 2353 09/29/23 0032 09/29/23 0200 09/29/23 0400  BP:   131/76 (!) 142/89  Pulse:   74 75  Resp:   16 16  Temp: 98.3 F (36.8 C)   98.3 F (36.8 C)  TempSrc: Oral     SpO2:   95% 97%  Weight:      Height:      PainSc:  7       Isolation Precautions No active isolations  Medications Medications  vancomycin (VANCOCIN) IVPB 1000 mg/200 mL premix (1,000 mg Intravenous New Bag/Given 09/29/23 0409)  lidocaine-EPINEPHrine (XYLOCAINE W/EPI) 2 %-1:200000 (PF) injection 10 mL (10 mLs Intradermal Given by Other 09/28/23 2331)  morphine (PF) 4 MG/ML injection 4 mg (4 mg Intravenous Given 09/28/23 2354)  potassium chloride SA (KLOR-CON M) CR tablet 40 mEq (40 mEq Oral Given 09/29/23 0409)    Mobility walks with device\

## 2023-09-29 NOTE — H&P (Signed)
History and Physical    Patient: Evelyn Campbell WUJ:[id number] DOB: 04/08/1956 DOA: 09/28/2023 DOS: the patient was seen and examined on 09/29/2023 PCP: Gweneth Dimitri, MD  Patient coming from: Home  Chief Complaint:  Chief Complaint  Patient presents with   Joint Swelling   HPI: Evelyn Campbell is a 67 y.o. female with medical history significant of nonalcoholic fatty liver, CKD, gallbladder disease, GERD, hyperlipidemia, hypertension, lower extremity edema, obesity, history of pneumonia, vitamin D deficiency, peripheral neuropathy, gout, right Charcot's foot, chronic edema left ankle who presented to the emergency department with a 2-day history of progressively worse edema, erythema and tenderness of her left ankle area to the point that she was unable to walk.  She denied fever, chills, rhinorrhea, sore throat, wheezing or hemoptysis.  No chest pain, palpitations, diaphoresis, PND or orthopnea.  No abdominal pain, nausea, emesis, diarrhea, constipation, melena or hematochezia.  No flank pain, dysuria, frequency or hematuria.  No polyuria, polydipsia, polyphagia or blurred vision.   Lab work: CBC showed a white count 17.1 with 81% neutrophils, hemoglobin 12.4 g/dL platelets 956.  Set rate was 39 mm/h.  CRP 36.7 mg/dL.  Synovial fluid analysis showed direct collar with Sorbid appearance, no crystals, WBC 3350 with 74% neutrophils, 11% lymphocytes and 15% monocytes.  CMP showed a 731, potassium 3.2, chloride 96 and CO2 24 mmol/L.  Glucose 114 and total bilirubin 1.4 mg/dL.  Normal calcium and renal function.  Total protein 6.4 and albumin 3.2 g/dL.  AST 51, ALT 54 and alkaline phosphatase 105 units/L.  Imaging: Left ankle x-ray show extensive erosions with obstruction of the distal talus, anterior process calcaneus, and the tarsal bones of the midfoot.  Erosions now present in an distal tibia, distal fibula, and talar dome.  An inflammatory process such as osteomyelitis is favored and clinical  correlation is suggested.  ED course: Initial vital signs were temperature 98.3 F, pulse 72, respirations 16, BP 106/60 mmHg O2 sat 95% on room air.  The patient received morphine 4 mg IVP, KCl 40 mill equivalents p.o. x 1 vancomycin.  She underwent arthrocentesis and received lidocaine with epinephrine intradermally in the left ankle area.   Review of Systems: As mentioned in the history of present illness. All other systems reviewed and are negative. Past Medical History:  Diagnosis Date   Arthritis    R ankle    Back pain    Chronic kidney disease    seen by Nephrology at [facility], due to all her numbers were"off", she  remarks that she was sent to superhydrate & then her labs values corrected.  Pt. will f/u /w nephrology   Constipation    Fatty liver    Gallbladder problem    GERD (gastroesophageal reflux disease)    occ   Gout    Hyperlipidemia    Hypertension    Joint pain    Lower extremity edema    Obesity    Peripheral neuropathy    Pneumonia 1980's   hx- not needed hospitalization    Vitamin D deficiency    Past Surgical History:  Procedure Laterality Date   ABDOMINAL HYSTERECTOMY  99   ANKLE FUSION Right 6/15   CHOLECYSTECTOMY  10   DENTAL SURGERY     implant- upper R    HERNIA REPAIR     INCISIONAL HERNIA REPAIR N/A 07/02/2015   Procedure: LAPAROSCOPIC INCISIONAL HERNIA;  Surgeon: Abigail Miyamoto, MD;  Location: [facility] OR;  Service: General;  Laterality: N/A;   INGUINAL HERNIA REPAIR Bilateral  08/20/2014   Procedure: LAPAROSCOPIC BILATERAL INGUINAL HERNIA REPAIR WITH MESH;  Surgeon: Abigail Miyamoto, MD;  Location[facility] OR;  Service: General;  Laterality: Bilateral;   INSERTION OF MESH N/A 08/20/2014   Procedure: INSERTION OF MESH;  Surgeon: Abigail Miyamoto, MD;  Location: [facility] OR;  Service: General;  Laterality: N/A;   INSERTION OF MESH N/A 07/02/2015   Procedure: INSERTION OF MESH;  Surgeon: Abigail Miyamoto, MD;  Location: [facility] OR;  Service: General;  Laterality:  N/A;   PANCREATECTOMY N/A 08/20/2014   Procedure: LAPAROSCOPIC ADRENALECTOMY;  Surgeon: Almond Lint, MD;  Location: [facility] OR;  Service: General;  Laterality: N/A;   TOE SURGERY Bilateral 2012   hammer toe   TONSILLECTOMY     UMBILICAL HERNIA REPAIR N/A 08/20/2014   Procedure: HERNIA REPAIR UMBILICAL ADULT;  Surgeon: Abigail Miyamoto, MD;  Location: [facility] OR;  Service: General;  Laterality: N/A;   VAGINAL DELIVERY  1994   WRIST SURGERY     Social History:  reports that she has never smoked. She has never used smokeless tobacco. She reports current alcohol use. She reports that she does not use drugs.  Allergies  Allergen Reactions   Celebrex [Celecoxib] Diarrhea and Other (See Comments)    Cause bloody stools   Ibuprofen Diarrhea and Other (See Comments)    Caused bloody stools   Meloxicam Diarrhea and Other (See Comments)    Caused bloody stools   Amoxicillin-Pot Clavulanate    Lipitor [Atorvastatin] Other (See Comments)    myalgia   Nsaids     Other reaction(s): Unknown   Statins Other (See Comments)    myalgia   Amoxicillin Itching and Rash   Penicillins Rash    Family History  Problem Relation Age of Onset   Heart failure Father    Sudden death Father     Prior to Admission medications   Medication Sig Start Date End Date Taking? Authorizing Provider  acetaminophen (TYLENOL) 500 MG tablet Take 500 mg by mouth as needed for mild pain.   Yes [provider]  allopurinol (ZYLOPRIM) 300 MG tablet Take 300 mg by mouth daily. 08/09/20  Yes [provider]  amLODipine (NORVASC) 5 MG tablet Take 1 tablet (5 mg total) by mouth daily. 04/16/21  Yes Danford, Orpha Bur D, NP  atenolol (TENORMIN) 100 MG tablet Take 100 mg by mouth at bedtime. 06/08/15  Yes [provider]  Cholecalciferol (VITAMIN D) 2000 UNITS tablet Take 2,000 Units by mouth daily.   Yes [provider]  docusate sodium (COLACE) 100 MG capsule Take 200 mg by mouth daily.    Yes [provider]  doxycycline (VIBRAMYCIN) 100 MG capsule Take 100 mg by mouth daily. 09/26/23  Yes [provider]  gabapentin (NEURONTIN) 300 MG capsule Take 300-900 mg by mouth See admin instructions.   Yes [provider]  Multiple Vitamin (MULTIVITAMIN WITH MINERALS) TABS tablet Take 1 tablet by mouth daily. Centrum Heart   Yes [provider]  niacin (NIASPAN) 1000 MG CR tablet Take 2 tablets (2,000 mg total) by mouth at bedtime. 08/18/20  Yes Helane Rima, DO  omega-3 acid ethyl esters (LOVAZA) 1 G capsule Take 2 g by mouth 2 (two) times daily.    Yes [provider]  omeprazole (PRILOSEC) 20 MG capsule Take 20 mg by mouth daily. 04/28/20  Yes [provider]  OZEMPIC, 2 MG/DOSE, 8 MG/3ML SOPN Inject into the skin. 09/26/23  Yes [provider]  colchicine 0.6 MG tablet 0.12 mg  as needed. Patient not taking: Reported on 09/29/2023    [provider]  OZEMPIC, 0.25 OR 0.5 MG/DOSE, 2 MG/3ML SOPN Inject 1 mg into the skin once a week. Patient not taking: Reported on 09/29/2023 04/20/22   [provider]    Physical Exam: Vitals:   09/29/23 0200 09/29/23 0400 09/29/23 0545 09/29/23 0645  BP: 131/76 (!) 142/89 130/84 (!) 144/89  Pulse: 74 75 73 79  Resp: 16 16 16 16   Temp:  98.3 F (36.8 C)  97.9 F (36.6 C)  TempSrc:      SpO2: 95% 97% 94% 96%  Weight:      Height:       Physical Exam Vitals and nursing note reviewed.  Constitutional:      Appearance: Normal appearance.  HENT:     Head: Normocephalic.     Nose: No rhinorrhea.     Mouth/Throat:     Mouth: Mucous membranes are moist.  Eyes:     General: No scleral icterus.    Pupils: Pupils are equal, round, and reactive to light.  Cardiovascular:     Rate and Rhythm: Normal rate and regular rhythm.  Pulmonary:     Effort: Pulmonary effort is normal.     Breath sounds: Normal breath sounds.  Abdominal:     General: Bowel sounds are normal.      Palpations: Abdomen is soft.     Tenderness: There is no abdominal tenderness.  Musculoskeletal:     Cervical back: Neck supple.     Right lower leg: No edema.     Left lower leg: No edema.     Right ankle: Decreased range of motion.     Left ankle: Swelling present. Tenderness present. Decreased range of motion.     Left foot: Decreased range of motion. Swelling present.  Skin:    General: Skin is warm and dry.  Neurological:     General: No focal deficit present.     Mental Status: She is alert and oriented to person, place, and time.  Psychiatric:        Mood and Affect: Mood normal.        Behavior: Behavior normal.        Data Reviewed:  Results are pending, will review when available.  Assessment and Plan: Principal Problem:   Charcot's joint of left foot Complicated by:   Septic arthritis of ankle (HCC) Admit to MedSurg/inpatient. Continue IV fluids. Keep n.p.o. for now. Analgesics as needed. Continue vancomycin per pharmacy. Follow-up synovial fluid culture and sensitivity Follow CBC and CMP in a.m. Orthopedic consult appreciated. -Will need surgical intervention.  Active Problems:   Gout Continue allopurinol 300 mg p.o. daily.    Hyponatremia Poor appetite recently. Continue IV fluids. Follow sodium level.    Hypokalemia Potassium has been supplemented. Follow-up potassium level.    Transaminitis Associated with:   Hyperbilirubinemia In the setting of:   Fatty liver Monitor LFTs closely. The patient is on 2000 mg of slow release niacin. Follow-up with GI/hepatology as an outpatient.    Mild protein malnutrition (HCC) In the setting of poor appetite/acute illness. May benefit from protein supplementation. Follow-up albumin level.    Thrombocytopenia (HCC) Monitor platelet count.    Essential hypertension Continue amlodipine 5 mg p.o. daily. Continue atenolol 100 mg p.o. bedtime.    Idiopathic peripheral neuropathy Continue  gabapentin per home instructions.    Mixed hyperlipidemia continue  Niaspan 2000 mg p.o. bedtime.    Prediabetes Carbohydrate modified  diet. CBG monitoring before meals and at bedtime.   Add on RI SS as needed. Check hemoglobin A1c.    Gastroesophageal reflux disease Continue omeprazole 20 mg or formulary equivalent daily      Advance Care Planning:   Code Status: Full Code   Consults: Orthopedic surgery Toni Arthurs, MD).  Family Communication:   Severity of Illness: The appropriate patient status for this patient is INPATIENT. Inpatient status is judged to be reasonable and necessary in order to provide the required intensity of service to ensure the patient's safety. The patient's presenting symptoms, physical exam findings, and initial radiographic and laboratory data in the context of their chronic comorbidities is felt to place them at high risk for further clinical deterioration. Furthermore, it is not anticipated that the patient will be medically stable for discharge from the hospital within 2 midnights of admission.   * I certify that at the point of admission it is my clinical judgment that the patient will require inpatient hospital care spanning beyond 2 midnights from the point of admission due to high intensity of service, high risk for further deterioration and high frequency of surveillance required.*  Author: Bobette Mo, MD 09/29/2023 7:49 AM  For on call review [email or web address].   This document was prepared using Dragon voice recognition software and may contain some unintended transcription errors.

## 2023-09-29 NOTE — ED Notes (Addendum)
RN called 3W about who would be the assigned nurse to this pt, since bed was ready for 68 minutes. Secretary states, the nurse is getting all new admits and is not sure when handoff man will be updated but would ask his charge RN and call ED back. This RN asked to speak to the Charge RN but was made aware he was not available at the time.

## 2023-09-29 NOTE — Op Note (Addendum)
09/29/2023  6:26 PM  PATIENT:  Evelyn Campbell  67 y.o. female  PRE-OPERATIVE DIAGNOSIS:  SEPTIC ARTHRITIS LEFT ANKLE  POST-OPERATIVE DIAGNOSIS:  SEPTIC ARTHRITIS LEFT ANKLE  Procedure(s):  Left ankle arthrotomy with irrigation and excisional debridement  SURGEON:  Toni Arthurs, MD  ASSISTANT: none  ANESTHESIA:   General  EBL:  minimal   TOURNIQUET:   Total Tourniquet Time Documented: Ankle (Left) - 26 minutes Total: Ankle (Left) - 26 minutes  COMPLICATIONS:  None apparent  DISPOSITION:  Extubated, awake and stable to recovery.  INDICATION FOR PROCEDURE: 67 year old female with a past medical history significant for left foot Charcot neuroarthropathy presents with painful left ankle effusion of 2 days duration.  Aspiration in the emergency room revealed an elevated white blood cell count and bacteria on Gram stain.  She presents now for irrigation and debridement of the ankle.  The risks and benefits of the alternative treatment options have been discussed in detail.  The patient wishes to proceed with surgery and specifically understands risks of bleeding, infection, nerve damage, blood clots, need for additional surgery, amputation and death.    PROCEDURE IN DETAIL: After preoperative consent was obtained and the correct operative site was identified, the patient was brought to the operating room and placed upon the operating table.  General anesthesia was induced.  Preoperative antibiotics were held pending cultures.  A surgical timeout was taken.  The left lower extremity was prepped and draped in standard sterile fashion with a tourniquet around the calf.  The extremity was elevated, and the tourniquet was inflated to 250 mmHg.  A longitudinal incision was then made over the anterior ankle in the midline.  Dissection was carried sharply down through the subcutaneous tissues.  The extensor retinaculum was incised over the EHL tendon and dissection was carried down through the  interval between the EHL and tibialis anterior.  The neurovascular bundle was protected.  There was a tense effusion noted at the anterior joint capsule.  The capsule was incised.  Approximately 10 cc of cloudy blood-tinged straw-colored fluid was collected and sent as a specimen to microbiology for aerobic, anaerobic and fungal cultures.  The synovium at the anterior joint capsule was intensely injected.  Synovitis was excised with a rondure and sent as a specimen to pathology.  There were significant degenerative changes at the joint with full thickness cartilage loss at the talar dome.  The ankle joint was irrigated with 3 L of normal saline.  A JP drain was placed in the anterior joint recess and brought out through the anteromedial joint line.  The retinaculum was repaired with 0 Vicryl figure-of-eight sutures.  Subcutaneous tissues were approximated with Monocryl.  Skin incision was closed with nylon.  20 cc of Exparel were infiltrated into the subcutaneous tissues around the incision for postoperative pain control.  Sterile dressings were applied followed by a compression wrap.  The tourniquet was released after application of the dressings.  The patient was awakened from anesthesia and transported to the recovery room in stable condition.  FOLLOW UP PLAN: Weightbearing as tolerated in a tall cam boot.  Follow drain output and hopefully remove it within a few days.  Continue vancomycin IV.  Infectious disease consult was called for appropriate antibiotic selection.  Okay to resume blood thinners postop day 1.

## 2023-09-29 NOTE — ED Notes (Signed)
She is the day shift RN

## 2023-09-29 NOTE — Consult Note (Addendum)
Reason for Consult:  left ankle pain Referring Physician: Dr. Doran Clay Evelyn Campbell is an 67 y.o. female.  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  She is treated by Evelyn Ferrara, MD over at [facility] for her Charcot.  She has been stable for quite some time and just saw Dr. Lorin Picket 2 weeks ago.  She sees a local podiatrist for a right foot ulcer.  She denies any trouble with the right foot ulcer and reports that it is healing slowly.  She denies any pain in the right foot.  She points to the left ankle where her foot bothers her pain is worst.  Over the last several days she went from her normal level of activity to being unable to bear weight on the ankle.  She presented to the emergency room last night.  She denies fever, chills, nausea, vomiting or changes in her appetite.  She is not diabetic.  She is not a smoker.  Her ankle was aspirated in the emergency department last night.  She last ate at 0400 this morning.  She takes an aspirin but no other blood thinners.  She was started on vancomycin overnight.  The patient has a history of gout in the left ankle and foot.  She reports that it has been well-controlled for many years.  Past Medical History:  Diagnosis Date   Arthritis    R ankle    Back pain    Chronic kidney disease    seen by Nephrology at [facility], due to all her numbers were"off", she  remarks that she was sent to superhydrate & then her labs values corrected.  Pt. will f/u /w nephrology   Constipation    Fatty liver    Gallbladder problem    GERD (gastroesophageal reflux disease)    occ   Gout    Hyperlipidemia    Hypertension    Joint pain    Lower extremity edema    Obesity    Peripheral neuropathy    Pneumonia 1980's   hx- not needed hospitalization    Vitamin D deficiency     Past  Surgical History:  Procedure Laterality Date   ABDOMINAL HYSTERECTOMY  99   ANKLE FUSION Right 6/15   CHOLECYSTECTOMY  10   DENTAL SURGERY     implant- upper R    HERNIA REPAIR     INCISIONAL HERNIA REPAIR N/A 07/02/2015   Procedure: LAPAROSCOPIC INCISIONAL HERNIA;  Surgeon: Abigail Miyamoto, MD;  Location: [facility] OR;  Service: General;  Laterality: N/A;   INGUINAL HERNIA REPAIR Bilateral 08/20/2014   Procedure: LAPAROSCOPIC BILATERAL INGUINAL HERNIA REPAIR WITH MESH;  Surgeon: Abigail Miyamoto, MD;  Location: [facility] OR;  Service: General;  Laterality: Bilateral;   INSERTION OF MESH N/A 08/20/2014   Procedure: INSERTION OF MESH;  Surgeon: Abigail Miyamoto, MD;  Location: [facility] OR;  Service: General;  Laterality: N/A;   INSERTION OF MESH N/A 07/02/2015   Procedure: INSERTION OF MESH;  Surgeon: Abigail Miyamoto, MD;  Location: [facility] OR;  Service: General;  Laterality: N/A;   PANCREATECTOMY N/A 08/20/2014   Procedure: LAPAROSCOPIC ADRENALECTOMY;  Surgeon: Almond Lint, MD;  Location: [facility] OR;  Service: General;  Laterality: N/A;   TOE SURGERY Bilateral 2012   hammer toe   TONSILLECTOMY     UMBILICAL HERNIA REPAIR N/A 08/20/2014   Procedure: HERNIA  REPAIR UMBILICAL ADULT;  Surgeon: Abigail Miyamoto, MD;  Location: [facility] OR;  Service: General;  Laterality: N/A;   VAGINAL DELIVERY  1994   WRIST SURGERY      Family History  Problem Relation Age of Onset   Heart failure Father    Sudden death Father     Social History:  reports that she has never smoked. She has never used smokeless tobacco. She reports current alcohol use. She reports that she does not use drugs.  Allergies:  Allergies  Allergen Reactions   Celebrex [Celecoxib] Diarrhea and Other (See Comments)    Cause bloody stools   Ibuprofen Diarrhea and Other (See Comments)    Caused bloody stools   Meloxicam Diarrhea and Other (See Comments)    Caused bloody stools   Amoxicillin-Pot Clavulanate    Lipitor [Atorvastatin] Other (See Comments)     myalgia   Nsaids     Other reaction(s): Unknown   Statins Other (See Comments)    myalgia   Amoxicillin Itching and Rash   Penicillins Rash    Medications: I have reviewed the patient's current medications.  Results for orders placed or performed during the hospital encounter of 09/28/23 (from the past 48 hour(s))  CBC with Differential     Status: Abnormal   Collection Time: 09/28/23 10:16 PM  Result Value Ref Range   WBC 17.1 (H) 4.0 - 10.5 K/uL   RBC 4.15 3.87 - 5.11 MIL/uL   Hemoglobin 12.4 12.0 - 15.0 g/dL   HCT 09.6 04.5 - 40.9 %   MCV 89.2 80.0 - 100.0 fL   MCH 29.9 26.0 - 34.0 pg   MCHC 33.5 30.0 - 36.0 g/dL   RDW 81.1 91.4 - 78.2 %   Platelets 118 (L) 150 - 400 K/uL   nRBC 0.0 0.0 - 0.2 %   Neutrophils Relative % 81 %   Neutro Abs 13.9 (H) 1.7 - 7.7 K/uL   Lymphocytes Relative 7 %   Lymphs Abs 1.2 0.7 - 4.0 K/uL   Monocytes Relative 11 %   Monocytes Absolute 1.8 (H) 0.1 - 1.0 K/uL   Eosinophils Relative 0 %   Eosinophils Absolute 0.0 0.0 - 0.5 K/uL   Basophils Relative 0 %   Basophils Absolute 0.0 0.0 - 0.1 K/uL   Immature Granulocytes 1 %   Abs Immature Granulocytes 0.12 (H) 0.00 - 0.07 K/uL    Comment: Performed at [facility], [address], [place], [place] [address]  Comprehensive metabolic panel     Status: Abnormal   Collection Time: 09/28/23 10:16 PM  Result Value Ref Range   Sodium 131 (L) 135 - 145 mmol/L   Potassium 3.2 (L) 3.5 - 5.1 mmol/L   Chloride 96 (L) 98 - 111 mmol/L   CO2 24 22 - 32 mmol/L   Glucose, Bld 114 (H) 70 - 99 mg/dL    Comment: Glucose reference range applies only to samples taken after fasting for at least 8 hours.   BUN 23 8 - 23 mg/dL   Creatinine, Ser 3.08 0.44 - 1.00 mg/dL   Calcium 9.3 8.9 - 65.7 mg/dL   Total Protein 6.4 (L) 6.5 - 8.1 g/dL   Albumin 3.2 (L) 3.5 - 5.0 g/dL   AST 51 (H) 15 - 41 U/L   ALT 54 (H) 0 - 44 U/L   Alkaline Phosphatase 105 38 - 126 U/L   Total Bilirubin 1.4 (H) <1.2 mg/dL    GFR, Estimated >84 >69 mL/min  Comment: (NOTE) Calculated using the CKD-EPI Creatinine Equation (2021)    Anion gap 11 5 - 15    Comment: Performed at [facility], [address], [place], [place] [address]  Sedimentation rate     Status: Abnormal   Collection Time: 09/28/23 10:16 PM  Result Value Ref Range   Sed Rate 39 (H) 0 - 22 mm/hr    Comment: Performed at [facility], [address], [place], [place] [address]  C-reactive protein     Status: Abnormal   Collection Time: 09/28/23 10:16 PM  Result Value Ref Range   CRP 36.7 (H) <1.0 mg/dL    Comment: Performed at [facility], [address], [place], [place] [address]  Body fluid culture w Gram Stain     Status: None (Preliminary result)   Collection Time: 09/28/23 10:36 PM   Specimen: Ankle; Body Fluid  Result Value Ref Range   Specimen Description      ANKLE Performed at [facility], [address] [place], [place] [address]    Special Requests      NONE Performed at [facility], [address], [place], [place] [address]    Gram Stain      MODERATE WBC PRESENT,BOTH PMN AND MONONUCLEAR FEW GRAM POSITIVE COCCI Performed at [facility], [address], [place], [place] [address]    Culture PENDING    Report Status PENDING   Synovial cell count + diff, w/ crystals     Status: Abnormal   Collection Time: 09/28/23 10:36 PM  Result Value Ref Range   Color, Synovial RED (A) YELLOW   Appearance-Synovial TURBID (A) CLEAR   Crystals, Fluid NONE SEEN    WBC, Synovial 3,350 (H) 0 - 200 /cu mm   Neutrophil, Synovial 74 (H) 0 - 25 %   Lymphocytes-Synovial Fld 11 0 - 20 %   Monocyte-Macrophage-Synovial Fluid 15 (L) 50 - 90 %    Comment: Performed at [facility], [address], [place], [place] [address]    DG Ankle Complete Left  Result Date: 09/29/2023 CLINICAL DATA:  Left ankle injury, swelling EXAM:  LEFT ANKLE COMPLETE - 3+ VIEW COMPARISON:  None Available. FINDINGS: There is destruction of the talar neck and anterior process of the calcaneus as well as the tarsal bones mid with extensive erosion is identified. While initially changes noted on prior examination may have related to Charcot joint, extensive erosions favor inflammatory process such as osteomyelitis. Extensive surrounding soft tissue swelling is present. Erosions now present distal tibia, distal fibula, and talar dome. IMPRESSION: 1. Extensive erosions with destruction of the distal talus, anterior process calcaneus, and the tarsal bones of the midfoot. Erosions now present distal tibia, distal fibula, and talar dome. An inflammatory process such as osteomyelitis is favored and clinical correlation is suggested. Electronically Signed   By: Helyn Numbers M.D.   On: 09/29/2023 00:58    ROS: As above.  10 system review is otherwise negative. PE:  Blood pressure (!) 144/89, pulse 79, temperature 97.9 F (36.6 C), resp. rate 16, height 5\' 5"  (1.651 m), weight 100.7 kg, SpO2 96%. Well-nourished well-developed overweight woman in no apparent distress.  Alert and oriented.  Normal mood and affect.  Extraocular motions are intact.  Respirations are unlabored.  Her left ankle has a large effusion.  The skin is otherwise healthy and intact.  Diminished sensibility to light touch dorsally and plantarly at the forefoot.  Active plantarflexion and dorsiflexion strength at the ankle and toes but this recreates pain at the ankle and cannot be completely tested.  Tender to palpation about the area of ankle effusion.  There is warmth as well.  Minimal erythema.  No drainage.  No lymphangitis or lymphadenopathy.  X-rays: Plain films of her left ankle are reviewed and are compared to films from May of this year.  She has interval cystic change of the distal tibia evident on today's films.  Significant soft tissue swelling and effusion are noted around the  ankle.  Severe Charcot changes at the talonavicular and calcaneocuboid joint, but these are largely unchanged from the films in May.  Assessment/Plan: Acute left ankle effusion -the source of the patient's acute effusion is not entirely clear.  The differential includes septic arthritis of the joint, gout and Charcot neuroarthropathy of the ankle.  Her white blood cell count is quite elevated, and her aspirate showed an increased white blood cell count as well as bacteria on Gram stain.  Crystal exam was negative.  At this point I believe septic arthritis is the most likely cause of her symptoms.  I believe it is medically necessary to consider urgent debridement of the joint.  This will also allow [name] to obtain deep cultures and pathology specimens.  She understands the risks and benefits of the alternative treatment options and elects surgical treatment.  I will get her scheduled for surgery today.  We will keep her n.p.o. and hold blood thinners.  I have counseled the patient that she is at increased risk for amputation if this condition has progressed to osteomyelitis of her tibia or talus.  Evelyn Campbell 09/29/2023, 9:39 AM

## 2023-09-30 DIAGNOSIS — B961 Klebsiella pneumoniae [K. pneumoniae] as the cause of diseases classified elsewhere: Secondary | ICD-10-CM | POA: Diagnosis not present

## 2023-09-30 DIAGNOSIS — B958 Unspecified staphylococcus as the cause of diseases classified elsewhere: Secondary | ICD-10-CM

## 2023-09-30 DIAGNOSIS — M00272 Other streptococcal arthritis, left ankle and foot: Secondary | ICD-10-CM

## 2023-09-30 DIAGNOSIS — M009 Pyogenic arthritis, unspecified: Secondary | ICD-10-CM | POA: Diagnosis not present

## 2023-09-30 LAB — CBC
HCT: 32.9 % — ABNORMAL LOW (ref 36.0–46.0)
Hemoglobin: 11.1 g/dL — ABNORMAL LOW (ref 12.0–15.0)
MCH: 30.1 pg (ref 26.0–34.0)
MCHC: 33.7 g/dL (ref 30.0–36.0)
MCV: 89.2 fL (ref 80.0–100.0)
Platelets: 108 10*3/uL — ABNORMAL LOW (ref 150–400)
RBC: 3.69 MIL/uL — ABNORMAL LOW (ref 3.87–5.11)
RDW: 15.3 % (ref 11.5–15.5)
WBC: 8.9 10*3/uL (ref 4.0–10.5)
nRBC: 0 % (ref 0.0–0.2)

## 2023-09-30 LAB — COMPREHENSIVE METABOLIC PANEL
ALT: 40 U/L (ref 0–44)
AST: 32 U/L (ref 15–41)
Albumin: 2.4 g/dL — ABNORMAL LOW (ref 3.5–5.0)
Alkaline Phosphatase: 131 U/L — ABNORMAL HIGH (ref 38–126)
Anion gap: 8 (ref 5–15)
BUN: 18 mg/dL (ref 8–23)
CO2: 22 mmol/L (ref 22–32)
Calcium: 8.6 mg/dL — ABNORMAL LOW (ref 8.9–10.3)
Chloride: 104 mmol/L (ref 98–111)
Creatinine, Ser: 0.7 mg/dL (ref 0.44–1.00)
GFR, Estimated: >60 mL/min (ref >60)
Glucose, Bld: 96 mg/dL (ref 70–99)
Potassium: 3.8 mmol/L (ref 3.5–5.1)
Sodium: 134 mmol/L — ABNORMAL LOW (ref 135–145)
Total Bilirubin: 0.8 mg/dL (ref <1.2)
Total Protein: 5.5 g/dL — ABNORMAL LOW (ref 6.5–8.1)

## 2023-09-30 LAB — VANCOMYCIN, RANDOM: Vancomycin Rm: 5 ug/mL

## 2023-09-30 LAB — HEMOGLOBIN A1C
Hgb A1c MFr Bld: 5.2 % (ref 4.8–5.6)
Mean Plasma Glucose: 102.54 mg/dL

## 2023-09-30 MED ORDER — VANCOMYCIN HCL 1250 MG/250ML IV SOLN
1250.0000 mg | INTRAVENOUS | Status: DC
  Administered 2023-09-30 – 2023-10-02 (×3): 1250 mg via INTRAVENOUS
  Filled 2023-09-30 (×4): qty 250

## 2023-09-30 MED ORDER — GLUCERNA SHAKE PO LIQD
237.0000 mL | ORAL | Status: DC
  Administered 2023-09-30 – 2023-10-04 (×5): 237 mL via ORAL
  Filled 2023-09-30 (×5): qty 237

## 2023-09-30 MED ORDER — SODIUM CHLORIDE 0.9 % IV SOLN
2.0000 g | INTRAVENOUS | Status: DC
  Administered 2023-09-30 – 2023-10-02 (×3): 2 g via INTRAVENOUS
  Filled 2023-09-30 (×5): qty 20

## 2023-09-30 NOTE — Evaluation (Signed)
Physical Therapy Evaluation Patient Details Name: Evelyn Campbell MRN: [id number] DOB: 04-Aug-1956 Today's Date: 09/30/2023  History of Present Illness  67 y.o. female who presented to the emergency department with a 2-day history of progressively worse edema, erythema and tenderness of her left ankle area to the point that she was unable to walk. Dx of septic arthritis.s/p Left ankle arthrotomy with irrigation and excisional debridement 09/29/23.  Pt with medical history significant of nonalcoholic fatty liver, CKD, gallbladder disease, GERD, hyperlipidemia, hypertension, lower extremity edema, obesity, history of pneumonia, vitamin D deficiency, peripheral neuropathy, gout, right Charcot's foot, chronic edema left ankle.  Clinical Impression  Pt admitted with above diagnosis. Mod assist for supine to sit with HOB elevated and used of bedrails. Mod assist for sit to stand with RW, then min assist to pivot to recliner, increased time/effort for all mobility. Pt has 10 steps to enter her home and her spouse works during the day so she doesn't have 24/7 assistance available. Patient will benefit from continued inpatient follow up therapy, <3 hours/day.  Pt currently with functional limitations due to the deficits listed below (see PT Problem List). Pt will benefit from acute skilled PT to increase their independence and safety with mobility to allow discharge.           If plan is discharge home, recommend the following: A lot of help with walking and/or transfers;A lot of help with bathing/dressing/bathroom;Assistance with cooking/housework;Assist for transportation;Help with stairs or ramp for entrance   Can travel by private vehicle   No    Equipment Recommendations Wheelchair (measurements PT);Wheelchair cushion (measurements PT)  Recommendations for Other Services       Functional Status Assessment Patient has had a recent decline in their functional status and demonstrates the ability to  make significant improvements in function in a reasonable and predictable amount of time.     Precautions / Restrictions Precautions Precautions: Fall Precaution Comments: denies h/o falls in past 6 months Required Braces or Orthoses: Other Brace Other Brace: L post op boot Restrictions Weight Bearing Restrictions: No Other Position/Activity Restrictions: WBAT      Mobility  Bed Mobility Overal bed mobility: Needs Assistance Bed Mobility: Supine to Sit     Supine to sit: Mod assist, HOB elevated, Used rails     General bed mobility comments: increased time, asssit to raise trunk and pivot hips to edge of bed    Transfers Overall transfer level: Needs assistance Equipment used: Rolling walker (2 wheels) Transfers: Sit to/from Stand, Bed to chair/wheelchair/BSC Sit to Stand: From elevated surface, +2 safety/equipment, Mod assist Stand pivot transfers: Min assist         General transfer comment: assist to power up, increased time/effort to pivot with RW; pt able to bear minimal weight on LLE, VC for technique/proximity to RW    Ambulation/Gait                  Stairs            Wheelchair Mobility     Tilt Bed    Modified Rankin (Stroke Patients Only)       Balance Overall balance assessment: Needs assistance Sitting-balance support: Feet supported, No upper extremity supported Sitting balance-Leahy Scale: Fair     Standing balance support: Bilateral upper extremity supported, During functional activity, Reliant on assistive device for balance Standing balance-Leahy Scale: Poor  Pertinent Vitals/Pain Pain Assessment Pain Assessment: 0-10 Pain Score: 8  Pain Location: L ankle Pain Descriptors / Indicators: Sore Pain Intervention(s): Limited activity within patient's tolerance, Monitored during session, Premedicated before session, Repositioned    Home Living Family/patient expects to be  discharged to:: Private residence Living Arrangements: Spouse/significant other Available Help at Discharge: Family   Home Access: Stairs to enter Entrance Stairs-Rails: Right Entrance Stairs-Number of Steps: 10   Home Layout: One level Home Equipment: Cane - single Librarian, academic (2 wheels);BSC/3in1;Shower seat Additional Comments: has a 3 wheeled RW; spouse works during the day    Prior Function Prior Level of Function : Independent/Modified Independent             Mobility Comments: walked with SPC; denies falls in past 6 months       Extremity/Trunk Assessment   Upper Extremity Assessment Upper Extremity Assessment: Generalized weakness    Lower Extremity Assessment Lower Extremity Assessment: LLE deficits/detail LLE Deficits / Details: L ankle in boot, LLE: Unable to fully assess due to pain;Unable to fully assess due to immobilization LLE Sensation: history of peripheral neuropathy    Cervical / Trunk Assessment Cervical / Trunk Assessment: Kyphotic  Communication   Communication Communication: No apparent difficulties  Cognition Arousal: Alert Behavior During Therapy: WFL for tasks assessed/performed Overall Cognitive Status: Within Functional Limits for tasks assessed                                          General Comments      Exercises     Assessment/Plan    PT Assessment Patient needs continued PT services  PT Problem List Decreased activity tolerance;Decreased balance;Decreased mobility;Decreased strength;Obesity       PT Treatment Interventions Therapeutic activities;Functional mobility training;Gait training;Therapeutic exercise;Patient/family education;DME instruction    PT Goals (Current goals can be found in the Care Plan section)  Acute Rehab PT Goals Patient Stated Goal: to walk and go home PT Goal Formulation: With patient Time For Goal Achievement: 10/14/23 Potential to Achieve Goals: Good    Frequency  Min 1X/week     Co-evaluation               AM-PAC PT "6 Clicks" Mobility  Outcome Measure Help needed turning from your back to your side while in a flat bed without using bedrails?: A Lot Help needed moving from lying on your back to sitting on the side of a flat bed without using bedrails?: A Lot Help needed moving to and from a bed to a chair (including a wheelchair)?: A Lot Help needed standing up from a chair using your arms (e.g., wheelchair or bedside chair)?: A Lot Help needed to walk in hospital room?: Total Help needed climbing 3-5 steps with a railing? : Total 6 Click Score: 10    End of Session Equipment Utilized During Treatment: Gait belt Activity Tolerance: Patient limited by fatigue;Patient limited by pain Patient left: in chair;with call bell/phone within reach Nurse Communication: Mobility status PT Visit Diagnosis: Unsteadiness on feet (R26.81);Muscle weakness (generalized) (M62.81);Difficulty in walking, not elsewhere classified (R26.2)    Time: 1133-1208 PT Time Calculation (min) (ACUTE ONLY): 35 min   Charges:   PT Evaluation $PT Eval Moderate Complexity: 1 Mod PT Treatments $Therapeutic Activity: 8-22 mins PT General Charges $$ ACUTE PT VISIT: 1 Visit        Ralene Bathe Kistler PT 09/30/2023  Acute Rehabilitation Services  Office [phone]

## 2023-09-30 NOTE — Plan of Care (Signed)
  Problem: Education: Goal: Knowledge of General Education information will improve Description: Including pain rating scale, medication(s)/side effects and non-pharmacologic comfort measures Outcome: Progressing   Problem: Coping: Goal: Level of anxiety will decrease Outcome: Progressing   Problem: Pain Management: Goal: General experience of comfort will improve Outcome: Progressing   Problem: Safety: Goal: Ability to remain free from injury will improve Outcome: Progressing

## 2023-09-30 NOTE — Progress Notes (Signed)
Orthopedic Tech Progress Note Patient Details:  Evelyn Campbell 30-Sep-1956 [id number]  Ortho Devices Type of Ortho Device: CAM walker Ortho Device/Splint Location: LLE Ortho Device/Splint Interventions: Application   Post Interventions Patient Tolerated: Well  Evelyn Campbell 09/30/2023, 9:34 AM

## 2023-09-30 NOTE — TOC Initial Note (Signed)
Transition of Care [facility]) - Initial/Assessment Note    Patient Details  Name: Evelyn Campbell MRN: [id number] Date of Birth: Aug 21, 1956  Transition of Care [facility]) CM/SW Contact:    Amada Jupiter, LCSW Phone Number: 09/30/2023, 2:01 PM  Clinical Narrative:                  Met with pt and spouse today to introduce TOC/ CSW role and begin dc planning discussions.  Pt reports she lives with spouse who does teach at [employer] during the day and no other local family to assist.  Per PT eval, pt's mobility very limited and may need to consider ST SNF for rehab.  Will await another PT session to see if improved and will ask weekend TOC coverage to follow up if possible.  Expected Discharge Plan:  (TBD) Barriers to Discharge: Continued Medical Work up   Patient Goals and CMS Choice Patient states their goals for this hospitalization and ongoing recovery are:: pt's goal is home dc          Expected Discharge Plan and Services In-house Referral: Clinical Social Work     Living arrangements for the past 2 months: Single Family Home                                      Prior Living Arrangements/Services Living arrangements for the past 2 months: Single Family Home Lives with:: Spouse Patient language and need for interpreter reviewed:: Yes Do you feel safe going back to the place where you live?: Yes      Need for Family Participation in Patient Care: Yes (Comment) Care giver support system in place?: Yes (comment)   Criminal Activity/Legal Involvement Pertinent to Current Situation/Hospitalization: No - Comment as needed  Activities of Daily Living   ADL Screening (condition at time of admission) Independently performs ADLs?: Yes (appropriate for developmental age) Is the patient deaf or have difficulty hearing?: No Does the patient have difficulty seeing, even when wearing glasses/contacts?: No Does the patient have difficulty concentrating, remembering, or making decisions?:  No  Permission Sought/Granted Permission sought to share information with : Family Supports Permission granted to share information with : Yes, Verbal Permission Granted  Share Information with NAME: spouse, Kayleigh Ruckel @ [phone]           Emotional Assessment Appearance:: Appears stated age Attitude/Demeanor/Rapport: Gracious, Engaged Affect (typically observed): Accepting Orientation: : Oriented to Self, Oriented to Place, Oriented to Situation, Oriented to  Time Alcohol / Substance Use: Not Applicable Psych Involvement: No (comment)  Admission diagnosis:  Hypokalemia [E87.6] Hyponatremia [E87.1] Thrombocytopenia (HCC) [D69.6] Serum total bilirubin elevated [R17] Septic arthritis of ankle (HCC) [M00.9] Elevated transaminase level [R74.01] Pain and swelling of left ankle [M25.572, M25.472] Septic arthritis of left ankle, due to unspecified organism [facility]) [M00.9] Patient Active Problem List   Diagnosis Date Noted   Septic arthritis of ankle (HCC) 09/29/2023   Hyponatremia 09/29/2023   Hypokalemia 09/29/2023   Transaminitis 09/29/2023   Mild protein malnutrition (HCC) 09/29/2023   Hyperbilirubinemia 09/29/2023   Thrombocytopenia (HCC) 09/29/2023   Abnormal liver function tests 05/25/2022   Abnormal weight gain 05/25/2022   Arthritis 05/25/2022   Body mass index (BMI) 38.0-38.9, adult 05/25/2022   Change in bowel habit 05/25/2022   Colon cancer screening 05/25/2022   Difficulty walking 05/25/2022   Drug-induced myopathy 05/25/2022   Fatty liver 05/25/2022   Flatulence, eructation and gas  pain 05/25/2022   Gastroesophageal reflux disease 05/25/2022   Gout 05/25/2022   Hardening of the aorta (main artery of the heart) (HCC) 05/25/2022   Lymphedema 05/25/2022   Neuropathy 05/25/2022   Other long term (current) drug therapy 05/25/2022   Other specified disorders of kidney and ureter 05/25/2022   Periumbilical pain 05/25/2022   Rectal bleeding 05/25/2022   Ulcer  of right foot with fat layer exposed (HCC) 05/25/2022   Lymphedema of left leg 04/21/2021   Idiopathic chronic gout of multiple sites without tophus 03/10/2021   Diverticular disease of colon 01/08/2021   Idiopathic peripheral neuropathy 01/08/2021   Mixed hyperlipidemia 01/08/2021   Peripheral venous insufficiency 01/08/2021   History of colonic polyps 01/08/2021   Vitamin D deficiency 01/08/2021   Metabolic syndrome 01/08/2021   Prediabetes 01/08/2021   Visceral obesity 12/25/2020   Screening for viral disease 05/18/2019   Other ganglion and cyst of synovium, tendon, and bursa 05/03/2019   Foot mass, right 05/01/2019   Degenerative arthritis of right ankle 10/05/2016   Incisional hernia 07/02/2015   Left adrenal mass (HCC) 08/20/2014   Essential hypertension 06/14/2014   Family history of ischemic heart disease 06/14/2014   Class 3 severe obesity with serious comorbidity and body mass index (BMI) of 40.0 to 44.9 in adult (HCC) 06/14/2014   Status post ankle arthrodesis 05/07/2014   Posterior equinus, acquired 02/05/2014   Charcot's joint of left foot 02/05/2014   Pes planovalgus, acquired 05/10/2013   Posterior tibial tendon dysfunction 05/10/2013   Primary osteoarthritis, unspecified ankle and foot 05/10/2013   PCP:  Gweneth Dimitri, MD Pharmacy:   [id number] - [place], [place] - [address] [place] [address] Phone: [phone] Fax: [phone]     Social Determinants of Health (SDOH) Social History: SDOH Screenings   Food Insecurity: No Food Insecurity (09/29/2023)  Housing: Low Risk  (09/29/2023)  Transportation Needs: No Transportation Needs (09/29/2023)  Utilities: Not At Risk (09/29/2023)  Depression (PHQ2-9): Medium Risk (05/22/2020)  Tobacco Use: Low Risk  (09/29/2023)   SDOH Interventions:     Readmission Risk Interventions     No data to display

## 2023-09-30 NOTE — Progress Notes (Signed)
PROGRESS NOTE    Evelyn Campbell  VQQ:[id number] DOB: 06/02/1956 DOA: 09/28/2023 PCP: Gweneth Dimitri, MD   Brief Narrative: 67 year old with past medical history significant for nonalcoholic fatty liver, CKD, gallbladder disease, GERD, hyperlipidemia, hypertension, lower extremity edema, obesity, history of pneumonia, vitamin D deficiency, peripheral neuropathy, gout, right Charcot foot, chronic edema of the left ankle who presents to the emergency department complaining of 2 days history of progressively worsening edema, erythema and tenderness on her left ankle, unable to ambulate.  Evaluation in the ED arthrocentesis was performed synovial fluid show few gram-positive cocci, white blood cell 3350.  Left ankle x-rays show extensive erosion with obstruction of the distal talus, anterior process calcaneus and tarsal bone of the midfoot.  Orthopedic consulted for septic arthritis of the left ankle patient was taken to the OR for left ankle arthrotomy with irrigation and excisional debridement 11/28.   Assessment & Plan:   Principal Problem:   Septic arthritis of ankle (HCC) Active Problems:   Essential hypertension   Idiopathic peripheral neuropathy   Mixed hyperlipidemia   Prediabetes   Fatty liver   Gastroesophageal reflux disease   Gout   Charcot's joint of left foot   Hyponatremia   Hypokalemia   Transaminitis   Mild protein malnutrition (HCC)   Hyperbilirubinemia   Thrombocytopenia (HCC)  1-Left ankle septic arthritis History of Charcot joint of the left foot -Patient presented with worsening pain, edema, redness left ankle. -Arthrocentesis showed gram-positive cocci  and gram negative rods.  -Underwent left ankle arthrotomy with irrigation and excisional debridement by Dr. Victorino Dike 09/29/2023 -IV Vancomycin, Ceftriaxone Ortho following.   Hyponatremia: -Suspect hypovolemia.  Continue with IV fluids -Improving  Hypokalemia:  -Replaced  Transaminases,  Hyperbilirubinemia; Fatty Liver -In setting infection. Follow trend.  -Liver function test trending down -She is on 2000 mg of slow release niacin  Mild protein malnutrition -Needs protein supplements  Thrombocytopenia -Suspect related to infection. Monitor count.   Hypertension; -Continue with atenolol and will hold Norvasc Norvasc, systolic blood pressure in the 130 range  Idiopathic peripheral neuropathy -Continue gabapentin  Hyperlipidemia -Niaspan  Prediabetes -Carb modified diet -A1c  GERD -Continue with PPI  Gout: -Continue with allopurinol    Estimated body mass index is 36.94 kg/m as calculated from the following:   Height as of this encounter: 5\' 5"  (1.651 m).   Weight as of this encounter: 100.7 kg.   DVT prophylaxis: Lovenox Code Status: Full code Family Communication: Husband at bedside.  Disposition Plan:  Status is: Inpatient Remains inpatient appropriate because: Management of septic arthritis    Consultants:  Dr Victorino Dike  Procedures:  I and D ankle.   Antimicrobials:    Subjective: She report pain is controlled. No new complaints.   Objective: Vitals:   09/29/23 1943 09/29/23 2225 09/30/23 0237 09/30/23 0655  BP: 107/69 121/81 108/76 131/79  Pulse: 73 65 68 74  Resp: 17 18 18 18   Temp: 97.7 F (36.5 C) 98.2 F (36.8 C) 98 F (36.7 C) (!) 97.5 F (36.4 C)  TempSrc: Oral Oral Oral   SpO2: 94% 97% 97% 99%  Weight:      Height:        Intake/Output Summary (Last 24 hours) at 09/30/2023 0726 Last data filed at 09/30/2023 0600 Gross per 24 hour  Intake 1958.94 ml  Output 490 ml  Net 1468.94 ml   Filed Weights   09/28/23 1943 09/29/23 1617  Weight: 100.7 kg 100.7 kg    Examination:  General exam: Appears calm  and comfortable  Respiratory system: Clear to auscultation. Respiratory effort normal. Cardiovascular system: S1 & S2 heard, RRR. No JVD, murmurs, rubs, gallops or clicks. No pedal edema. Gastrointestinal  system: Abdomen is nondistended, soft and nontender. No organomegaly or masses felt. Normal bowel sounds heard. Central nervous system: Alert and oriented. No focal neurological deficits. Extremities: Left LE with dressing and cam  boot.   Data Reviewed: I have personally reviewed following labs and imaging studies  CBC: Recent Labs  Lab 09/28/23 2216 09/30/23 0348  WBC 17.1* 8.9  NEUTROABS 13.9*  --   HGB 12.4 11.1*  HCT 37.0 32.9*  MCV 89.2 89.2  PLT 118* 108*   Basic Metabolic Panel: Recent Labs  Lab 09/28/23 2216 09/30/23 0348  NA 131* 134*  K 3.2* 3.8  CL 96* 104  CO2 24 22  GLUCOSE 114* 96  BUN 23 18  CREATININE 0.97 0.70  CALCIUM 9.3 8.6*   GFR: Estimated Creatinine Clearance: 78.1 mL/min (by C-G formula based on SCr of 0.7 mg/dL). Liver Function Tests: Recent Labs  Lab 09/28/23 2216 09/30/23 0348  AST 51* 32  ALT 54* 40  ALKPHOS 105 131*  BILITOT 1.4* 0.8  PROT 6.4* 5.5*  ALBUMIN 3.2* 2.4*   No results for input(s): "LIPASE", "AMYLASE" in the last 168 hours. No results for input(s): "AMMONIA" in the last 168 hours. Coagulation Profile: No results for input(s): "INR", "PROTIME" in the last 168 hours. Cardiac Enzymes: No results for input(s): "CKTOTAL", "CKMB", "CKMBINDEX", "TROPONINI" in the last 168 hours. BNP (last 3 results) No results for input(s): "PROBNP" in the last 8760 hours. HbA1C: No results for input(s): "HGBA1C" in the last 72 hours. CBG: No results for input(s): "GLUCAP" in the last 168 hours. Lipid Profile: No results for input(s): "CHOL", "HDL", "LDLCALC", "TRIG", "CHOLHDL", "LDLDIRECT" in the last 72 hours. Thyroid Function Tests: No results for input(s): "TSH", "T4TOTAL", "FREET4", "T3FREE", "THYROIDAB" in the last 72 hours. Anemia Panel: No results for input(s): "VITAMINB12", "FOLATE", "FERRITIN", "TIBC", "IRON", "RETICCTPCT" in the last 72 hours. Sepsis Labs: No results for input(s): "PROCALCITON", "LATICACIDVEN" in the last  168 hours.  Recent Results (from the past 240 hour(s))  Body fluid culture w Gram Stain     Status: None (Preliminary result)   Collection Time: 09/28/23 10:36 PM   Specimen: Ankle; Body Fluid  Result Value Ref Range Status   Specimen Description   Final    ANKLE Performed at [facility], [address], [place], [place] [address]    Special Requests   Final    NONE Performed at [facility], [address] [place], [place] [address]    Gram Stain   Final    MODERATE WBC PRESENT,BOTH PMN AND MONONUCLEAR FEW GRAM POSITIVE COCCI Performed at [facility], [address] [place], [place] [address]    Culture PENDING  Incomplete   Report Status PENDING  Incomplete  Surgical pcr screen     Status: Abnormal   Collection Time: 09/29/23 11:34 AM   Specimen: Nasal Mucosa; Nasal Swab  Result Value Ref Range Status   MRSA, PCR NEGATIVE NEGATIVE Final   Staphylococcus aureus POSITIVE (A) NEGATIVE Final    Comment: (NOTE) The Xpert SA Assay (FDA approved for NASAL specimens in patients 69 years of age and older), is one component of a comprehensive surveillance program. It is not intended to diagnose infection nor to guide or monitor treatment. Performed at [facility], [address]  [place], [place] [address]   Aerobic/Anaerobic Culture w Gram Stain (surgical/deep wound)     Status: None (Preliminary result)   Collection Time: 09/29/23  5:13 PM   Specimen: PATH Cytology Misc. fluid; Body Fluid  Result Value Ref Range Status   Specimen Description   Final    FLUID LEFT ANKLE EFFUSION Performed at [facility], [address], [place], [place] [address]    Special Requests   Final    NONE Performed at [facility], [address], [place], [place] [address]    Gram Stain   Final    MODERATE WBC PRESENT, PREDOMINANTLY PMN RARE GRAM POSITIVE COCCI IN CLUSTERS    Culture    Final    NO GROWTH < 12 HOURS Performed at [facility], [address], [place], [place] [address]    Report Status PENDING  Incomplete         Radiology Studies: DG Ankle Complete Left  Result Date: 09/29/2023 CLINICAL DATA:  Left ankle injury, swelling EXAM: LEFT ANKLE COMPLETE - 3+ VIEW COMPARISON:  None Available. FINDINGS: There is destruction of the talar neck and anterior process of the calcaneus as well as the tarsal bones mid with extensive erosion is identified. While initially changes noted on prior examination may have related to Charcot joint, extensive erosions favor inflammatory process such as osteomyelitis. Extensive surrounding soft tissue swelling is present. Erosions now present distal tibia, distal fibula, and talar dome. IMPRESSION: 1. Extensive erosions with destruction of the distal talus, anterior process calcaneus, and the tarsal bones of the midfoot. Erosions now present distal tibia, distal fibula, and talar dome. An inflammatory process such as osteomyelitis is favored and clinical correlation is suggested. Electronically Signed   By: Helyn Numbers M.D.   On: 09/29/2023 00:58        Scheduled Meds:  allopurinol  300 mg Oral Daily   amLODipine  5 mg Oral Daily   atenolol  100 mg Oral QHS   Chlorhexidine Gluconate Cloth  6 each Topical Daily   cholecalciferol  2,000 Units Oral Daily   docusate sodium  100 mg Oral BID   enoxaparin (LOVENOX) injection  30 mg Subcutaneous Q24H   gabapentin  300 mg Oral Daily   And   gabapentin  900 mg Oral QHS   mupirocin ointment  1 Application Nasal BID   niacin  2,000 mg Oral QHS   omega-3 acid ethyl esters  2 g Oral BID   pantoprazole  40 mg Oral Daily   vancomycin variable dose per unstable renal function (pharmacist dosing)   Does not apply See admin instructions   Continuous Infusions:  0.9 % NaCl with KCl 40 mEq / L 40 mL/hr at 09/29/23 1956     LOS: 1 day    Time spent: 35 minutes.     Alba Cory, MD [facility]   If 7PM-7AM, please contact night-coverage [email or web address]  09/30/2023, 7:26 AM

## 2023-09-30 NOTE — Progress Notes (Signed)
Subjective: Patient seen in rounds for Dr. Victorino Dike. Patient reports pain as mild to moderate.  Denies N/V/CP/SOB/Abd pain. She denies any tingling or numbness in LE. She reports pain controlled.   Objective:   VITALS:   Vitals:   09/29/23 2225 09/30/23 0237 09/30/23 0655 09/30/23 1001  BP: 121/81 108/76 131/79 109/70  Pulse: 65 68 74 72  Resp: 18 18 18 16   Temp: 98.2 F (36.8 C) 98 F (36.7 C) (!) 97.5 F (36.4 C) 97.6 F (36.4 C)  TempSrc: Oral Oral    SpO2: 97% 97% 99% 97%  Weight:      Height:        Patient sitting up in bed. NAD.  Neurologically intact ABD soft Neurovascular intact Sensation intact distally Intact pulses distally Dorsiflexion/Plantar flexion intact No cellulitis present Compartment soft Cam boot on LLE.  JP drain 15 cc SS output. Continue JP drain.   Lab Results  Component Value Date   WBC 8.9 09/30/2023   HGB 11.1 (L) 09/30/2023   HCT 32.9 (L) 09/30/2023   MCV 89.2 09/30/2023   PLT 108 (L) 09/30/2023   BMET    Component Value Date/Time   NA 134 (L) 09/30/2023 0348   NA 142 02/17/2021 0000   K 3.8 09/30/2023 0348   CL 104 09/30/2023 0348   CO2 22 09/30/2023 0348   GLUCOSE 96 09/30/2023 0348   BUN 18 09/30/2023 0348   BUN 13 02/17/2021 0000   CREATININE 0.70 09/30/2023 0348   CALCIUM 8.6 (L) 09/30/2023 0348   GFRNONAA >60 09/30/2023 0348   Results for orders placed or performed during the hospital encounter of 09/28/23  Body fluid culture w Gram Stain     Status: None (Preliminary result)   Collection Time: 09/28/23 10:36 PM   Specimen: Ankle; Body Fluid  Result Value Ref Range Status   Specimen Description   Final    ANKLE Performed at [facility], [address], [place], [place] [address]    Special Requests   Final    NONE Performed at [facility], [address] [place], [place] [address]    Gram Stain   Final    MODERATE WBC PRESENT,BOTH PMN AND MONONUCLEAR FEW GRAM  POSITIVE COCCI    Culture   Final    CULTURE REINCUBATED FOR BETTER GROWTH Performed at [facility], [address] [place], [place] [address]    Report Status PENDING  Incomplete  Surgical pcr screen     Status: Abnormal   Collection Time: 09/29/23 11:34 AM   Specimen: Nasal Mucosa; Nasal Swab  Result Value Ref Range Status   MRSA, PCR NEGATIVE NEGATIVE Final   Staphylococcus aureus POSITIVE (A) NEGATIVE Final    Comment: (NOTE) The Xpert SA Assay (FDA approved for NASAL specimens in patients 87 years of age and older), is one component of a comprehensive surveillance program. It is not intended to diagnose infection nor to guide or monitor treatment. Performed at [facility], [address], [place], [place] [address]   Aerobic/Anaerobic Culture w Gram Stain (surgical/deep wound)     Status: None (Preliminary result)   Collection Time: 09/29/23  5:13 PM   Specimen: PATH Cytology Misc. fluid; Body Fluid  Result Value Ref Range Status   Specimen Description   Final    FLUID LEFT ANKLE EFFUSION Performed at [facility], [address] [place], [place] [address]    Special Requests  Final    NONE Performed at [facility], [address] [place], [place] [address]    Gram Stain   Final    MODERATE WBC PRESENT, PREDOMINANTLY PMN RARE GRAM POSITIVE COCCI IN CLUSTERS    Culture   Final    NO GROWTH < 12 HOURS Performed at [facility], [address], [place], [place] [address]    Report Status PENDING  Incomplete     Assessment/Plan: 1 Day Post-Op   Principal Problem:   Septic arthritis of ankle (HCC) Active Problems:   Essential hypertension   Idiopathic peripheral neuropathy   Mixed hyperlipidemia   Prediabetes   Fatty liver   Gastroesophageal reflux disease   Gout   Charcot's joint of left foot   Hyponatremia   Hypokalemia   Transaminitis   Mild protein malnutrition (HCC)    Hyperbilirubinemia   Thrombocytopenia (HCC)   WBAT in cam boot.  DVT ppx: Lovenox, SCDs, TEDS PO pain control PT/OT: Mobilize with PT.  Dispo:  - Patient under care of the medical team.  - Continue JP drain and monitor output.  - Gram stain gram positive cocci in cluster. Cultures pending. Antibiotics per ID recommendation.      Clois Dupes, PA-C 09/30/2023, 11:05 AM  [facility]  [place] [address], [address], [place], [place] [address] Phone: [phone] [email or web address] Facebook  [employer]

## 2023-09-30 NOTE — Progress Notes (Signed)
Pharmacy Antibiotic Note  Evelyn Campbell is a 67 y.o. female admitted on 09/28/2023 with septic arthritis of left ankle. Pharmacy has been consulted for Vancomycin dosing. S/p L ankle arthrocentesis in ED on 11/27 and L ankle arthrotomy with irrigation and excisional debridement in OR on 11/28.   Vancomycin 1g given in ED 11/28 at 0409 Vancomycin 2g given in ED 11/28 at 0854 Random Vancomycin level ordered this AM assess excretion before further dosing - random Vancomycin level 11/29 at 0348 = 5 mcg/mL SCr stable/WNL at 0.7  Plan: Start Vancomycin at 1250mg  IV q24h Vancomycin levels at steady state, as indicated Ceftriaxone 2g IV q24h per MD Monitor renal function, cultures, clinical course, ID recommendations   Height: 5\' 5"  (165.1 cm) Weight: 100.7 kg (222 lb 0.1 oz) IBW/kg (Calculated) : 57  Temp (24hrs), Avg:98.1 F (36.7 C), Min:97.5 F (36.4 C), Max:98.7 F (37.1 C)  Recent Labs  Lab 09/28/23 2216 09/30/23 0348  WBC 17.1* 8.9  CREATININE 0.97 0.70  VANCORANDOM  --  5    Estimated Creatinine Clearance: 78.1 mL/min (by C-G formula based on SCr of 0.7 mg/dL).    Allergies  Allergen Reactions   Celebrex [Celecoxib] Diarrhea and Other (See Comments)    Cause bloody stools   Ibuprofen Diarrhea and Other (See Comments)    Caused bloody stools   Meloxicam Diarrhea and Other (See Comments)    Caused bloody stools   Amoxicillin-Pot Clavulanate    Lipitor [Atorvastatin] Other (See Comments)    myalgia   Nsaids     Other reaction(s): Unknown   Statins Other (See Comments)    myalgia   Amoxicillin Itching and Rash   Penicillins Rash    Antimicrobials this admission:  11/28 Vancomycin >> 11/29 Ceftriaxone >>  Microbiology results:  11/27 body fluid from ankle: few GPC on gram stain, culture pending 1128 Surgical PCR: MRSA negative, Staph aureus positive 11/28 L ankle effusion (OR culture): rare GPC in clusters on gram stain, culture pending 11/28 fungus culture:  pending    Greer Pickerel, PharmD, BCPS Clinical Pharmacist 09/30/2023 8:13 AM

## 2023-10-01 ENCOUNTER — Other Ambulatory Visit: Payer: Self-pay

## 2023-10-01 DIAGNOSIS — M009 Pyogenic arthritis, unspecified: Secondary | ICD-10-CM | POA: Diagnosis not present

## 2023-10-01 LAB — BASIC METABOLIC PANEL
Anion gap: 7 (ref 5–15)
BUN: 15 mg/dL (ref 8–23)
CO2: 26 mmol/L (ref 22–32)
Calcium: 8.7 mg/dL — ABNORMAL LOW (ref 8.9–10.3)
Chloride: 100 mmol/L (ref 98–111)
Creatinine, Ser: 0.67 mg/dL (ref 0.44–1.00)
GFR, Estimated: >60 mL/min (ref >60)
Glucose, Bld: 108 mg/dL — ABNORMAL HIGH (ref 70–99)
Potassium: 4.2 mmol/L (ref 3.5–5.1)
Sodium: 133 mmol/L — ABNORMAL LOW (ref 135–145)

## 2023-10-01 LAB — PROTEIN, BODY FLUID (OTHER): Total Protein, Body Fluid Other: 3.7 g/dL

## 2023-10-01 LAB — CBC
HCT: 31.1 % — ABNORMAL LOW (ref 36.0–46.0)
Hemoglobin: 10.3 g/dL — ABNORMAL LOW (ref 12.0–15.0)
MCH: 29.9 pg (ref 26.0–34.0)
MCHC: 33.1 g/dL (ref 30.0–36.0)
MCV: 90.1 fL (ref 80.0–100.0)
Platelets: 149 10*3/uL — ABNORMAL LOW (ref 150–400)
RBC: 3.45 MIL/uL — ABNORMAL LOW (ref 3.87–5.11)
RDW: 15.3 % (ref 11.5–15.5)
WBC: 8.5 10*3/uL (ref 4.0–10.5)
nRBC: 0 % (ref 0.0–0.2)

## 2023-10-01 LAB — HIV ANTIBODY (ROUTINE TESTING W REFLEX): HIV Screen 4th Generation wRfx: NONREACTIVE

## 2023-10-01 LAB — GLUCOSE, BODY FLUID OTHER: Glucose, Body Fluid Other: <2 mg/dL

## 2023-10-01 LAB — URIC ACID, BODY FLUID: Uric Acid Body Fluid: 4 mg/dL

## 2023-10-01 MED ORDER — POLYETHYLENE GLYCOL 3350 17 G PO PACK
17.0000 g | PACK | Freq: Every day | ORAL | Status: DC
  Administered 2023-10-01 – 2023-10-04 (×3): 17 g via ORAL
  Filled 2023-10-01 (×3): qty 1

## 2023-10-01 MED ORDER — SODIUM CHLORIDE 0.9% FLUSH: 10.0000 mL | INTRAVENOUS | Status: DC | PRN

## 2023-10-01 MED ORDER — MORPHINE SULFATE (PF) 2 MG/ML IV SOLN
2.0000 mg | INTRAVENOUS | Status: DC | PRN
  Administered 2023-10-03: 2 mg via INTRAVENOUS
  Filled 2023-10-01: qty 1

## 2023-10-01 MED ORDER — ENOXAPARIN SODIUM 40 MG/0.4ML IJ SOSY
40.0000 mg | PREFILLED_SYRINGE | INTRAMUSCULAR | Status: DC
  Administered 2023-10-01 – 2023-10-03 (×3): 40 mg via SUBCUTANEOUS
  Filled 2023-10-01 (×3): qty 0.4

## 2023-10-01 NOTE — Progress Notes (Signed)
PROGRESS NOTE    Evelyn Campbell  ZOX:[id number] DOB: 06-Sep-1956 DOA: 09/28/2023 PCP: Gweneth Dimitri, MD   Brief Narrative: 67 year old with past medical history significant for nonalcoholic fatty liver, CKD, gallbladder disease, GERD, hyperlipidemia, hypertension, lower extremity edema, obesity, history of pneumonia, vitamin D deficiency, peripheral neuropathy, gout, right Charcot foot, chronic edema of the left ankle who presents to the emergency department complaining of 2 days history of progressively worsening edema, erythema and tenderness on her left ankle, unable to ambulate.  Evaluation in the ED arthrocentesis was performed synovial fluid show few gram-positive cocci, white blood cell 3350.  Left ankle x-rays show extensive erosion with obstruction of the distal talus, anterior process calcaneus and tarsal bone of the midfoot.  Orthopedic consulted for septic arthritis of the left ankle patient was taken to the OR for left ankle arthrotomy with irrigation and excisional debridement 11/28.   Assessment & Plan:   Principal Problem:   Septic arthritis of ankle (HCC) Active Problems:   Essential hypertension   Idiopathic peripheral neuropathy   Mixed hyperlipidemia   Prediabetes   Fatty liver   Gastroesophageal reflux disease   Gout   Charcot's joint of left foot   Hyponatremia   Hypokalemia   Transaminitis   Mild protein malnutrition (HCC)   Hyperbilirubinemia   Thrombocytopenia (HCC)  1-Left ankle septic arthritis History of Charcot joint of the left foot -Patient presented with worsening pain, edema, redness left ankle. -Arthrocentesis synovial fluid:  showed: rare klebsiella Pneumoniae.  -Underwent left ankle arthrotomy with irrigation and excisional debridement by Dr. Victorino Dike 09/29/2023 -IV Vancomycin, Ceftriaxone Ortho following.  Culture form I and D ; no growth  ID consulted, recommend 4 weeks IV antibiotics.   Hyponatremia: -Suspect hypovolemia.  Continue  with IV fluids -Improving  Hypokalemia:  -Replaced  Transaminases, Hyperbilirubinemia; Fatty Liver -In setting infection. Follow trend.  -Liver function test trending down -She is on 2000 mg of slow release niacin  Mild protein malnutrition -Needs protein supplements  Thrombocytopenia -Suspect related to infection. Monitor count.   Hypertension; -Continue with atenolol and will hold Norvasc Norvasc, systolic blood pressure in the 130 range  Idiopathic peripheral neuropathy -Continue gabapentin  Hyperlipidemia -Niaspan  Prediabetes -Carb modified diet -A1c  GERD -Continue with PPI  Gout: -Continue with allopurinol    Estimated body mass index is 36.94 kg/m as calculated from the following:   Height as of this encounter: 5\' 5"  (1.651 m).   Weight as of this encounter: 100.7 kg.   DVT prophylaxis: Lovenox Code Status: Full code Family Communication: Husband at bedside 11/29.  Disposition Plan:  Status is: Inpatient Remains inpatient appropriate because: Management of septic arthritis    Consultants:  Dr Victorino Dike  Procedures:  I and D ankle.   Antimicrobials:    Subjective: No new complaints. Pain controlled. No BM  Objective: Vitals:   09/30/23 1401 09/30/23 1755 09/30/23 1927 10/01/23 0504  BP: 108/79 120/85 (!) 106/92 139/82  Pulse: 73 62 79 73  Resp: 15 15 18 18   Temp: 98.4 F (36.9 C) 98.3 F (36.8 C) 98.6 F (37 C) 98.9 F (37.2 C)  TempSrc: Oral Oral  Oral  SpO2: 93% (!) 65% 97% 99%  Weight:      Height:        Intake/Output Summary (Last 24 hours) at 10/01/2023 1238 Last data filed at 10/01/2023 0900 Gross per 24 hour  Intake 1070 ml  Output 1085 ml  Net -15 ml   Filed Weights   09/28/23 1943 09/29/23  1617  Weight: 100.7 kg 100.7 kg    Examination:  General exam: NAD Respiratory system: CTA. Cardiovascular system: S 1 S 2 RRR Gastrointestinal system: BS present, soft, nt Extremities: Left LE with dressing and cam   boot.   Data Reviewed: I have personally reviewed following labs and imaging studies  CBC: Recent Labs  Lab 09/28/23 2216 09/30/23 0348 10/01/23 0404  WBC 17.1* 8.9 8.5  NEUTROABS 13.9*  --   --   HGB 12.4 11.1* 10.3*  HCT 37.0 32.9* 31.1*  MCV 89.2 89.2 90.1  PLT 118* 108* 149*   Basic Metabolic Panel: Recent Labs  Lab 09/28/23 2216 09/30/23 0348 10/01/23 0404  NA 131* 134* 133*  K 3.2* 3.8 4.2  CL 96* 104 100  CO2 24 22 26   GLUCOSE 114* 96 108*  BUN 23 18 15   CREATININE 0.97 0.70 0.67  CALCIUM 9.3 8.6* 8.7*   GFR: Estimated Creatinine Clearance: 78.1 mL/min (by C-G formula based on SCr of 0.67 mg/dL). Liver Function Tests: Recent Labs  Lab 09/28/23 2216 09/30/23 0348  AST 51* 32  ALT 54* 40  ALKPHOS 105 131*  BILITOT 1.4* 0.8  PROT 6.4* 5.5*  ALBUMIN 3.2* 2.4*   No results for input(s): "LIPASE", "AMYLASE" in the last 168 hours. No results for input(s): "AMMONIA" in the last 168 hours. Coagulation Profile: No results for input(s): "INR", "PROTIME" in the last 168 hours. Cardiac Enzymes: No results for input(s): "CKTOTAL", "CKMB", "CKMBINDEX", "TROPONINI" in the last 168 hours. BNP (last 3 results) No results for input(s): "PROBNP" in the last 8760 hours. HbA1C: Recent Labs    09/30/23 0853  HGBA1C 5.2   CBG: No results for input(s): "GLUCAP" in the last 168 hours. Lipid Profile: No results for input(s): "CHOL", "HDL", "LDLCALC", "TRIG", "CHOLHDL", "LDLDIRECT" in the last 72 hours. Thyroid Function Tests: No results for input(s): "TSH", "T4TOTAL", "FREET4", "T3FREE", "THYROIDAB" in the last 72 hours. Anemia Panel: No results for input(s): "VITAMINB12", "FOLATE", "FERRITIN", "TIBC", "IRON", "RETICCTPCT" in the last 72 hours. Sepsis Labs: No results for input(s): "PROCALCITON", "LATICACIDVEN" in the last 168 hours.  Recent Results (from the past 240 hour(s))  Body fluid culture w Gram Stain     Status: None (Preliminary result)   Collection  Time: 09/28/23 10:36 PM   Specimen: Ankle; Body Fluid  Result Value Ref Range Status   Specimen Description   Final    ANKLE Performed at [facility], [address], [place], [place] [address]    Special Requests   Final    NONE Performed at [facility], [address], [place], [place] [address]    Gram Stain   Final    MODERATE WBC PRESENT,BOTH PMN AND MONONUCLEAR FEW GRAM POSITIVE COCCI    Culture   Final    CULTURE REINCUBATED FOR BETTER GROWTH RARE KLEBSIELLA PNEUMONIAE CRITICAL RESULT CALLED TO, READ BACK BY AND VERIFIED WITH: RN [place] W. [phone] [id number] FCP Performed at [facility], [address] [place], [place] [address]    Report Status PENDING  Incomplete  Surgical pcr screen     Status: Abnormal   Collection Time: 09/29/23 11:34 AM   Specimen: Nasal Mucosa; Nasal Swab  Result Value Ref Range Status   MRSA, PCR NEGATIVE NEGATIVE Final   Staphylococcus aureus POSITIVE (A) NEGATIVE Final    Comment: (NOTE) The Xpert SA Assay (FDA approved for NASAL specimens in patients 49 years of age and older), is one component of a  comprehensive surveillance program. It is not intended to diagnose infection nor to guide or monitor treatment. Performed at [facility], [address], [place], [place] [address]   Aerobic/Anaerobic Culture w Gram Stain (surgical/deep wound)     Status: None (Preliminary result)   Collection Time: 09/29/23  5:13 PM   Specimen: PATH Cytology Misc. fluid; Body Fluid  Result Value Ref Range Status   Specimen Description   Final    FLUID LEFT ANKLE EFFUSION Performed at [facility], [address] [place], [place] [address]    Special Requests   Final    NONE Performed at [facility], [address] [place], [place] [address]    Gram Stain   Final    MODERATE WBC PRESENT, PREDOMINANTLY PMN RARE GRAM POSITIVE COCCI IN CLUSTERS    Culture    Final    NO GROWTH < 12 HOURS Performed at [facility], [address], [place], [place] [address]    Report Status PENDING  Incomplete         Radiology Studies: [name] EKG SITE RITE  Result Date: 10/01/2023 If Site Rite image not attached, placement could not be confirmed due to current cardiac rhythm.       Scheduled Meds:  allopurinol  300 mg Oral Daily   atenolol  100 mg Oral QHS   Chlorhexidine Gluconate Cloth  6 each Topical Daily   cholecalciferol  2,000 Units Oral Daily   docusate sodium  100 mg Oral BID   enoxaparin (LOVENOX) injection  40 mg Subcutaneous Q24H   feeding supplement (GLUCERNA SHAKE)  237 mL Oral Q24H   gabapentin  300 mg Oral Daily   And   gabapentin  900 mg Oral QHS   mupirocin ointment  1 Application Nasal BID   niacin  2,000 mg Oral QHS   omega-3 acid ethyl esters  2 g Oral BID   pantoprazole  40 mg Oral Daily   Continuous Infusions:  cefTRIAXone (ROCEPHIN)  IV 2 g (09/30/23 0952)   vancomycin 1,250 mg (09/30/23 1140)     LOS: 2 days    Time spent: 35 minutes.     Alba Cory, MD [facility]   If 7PM-7AM, please contact night-coverage [email or web address]  10/01/2023, 12:38 PM

## 2023-10-01 NOTE — Consult Note (Addendum)
[facility]    Date of Admission:  09/28/2023   Total days of inpatient antibiotics 1        Reason for Consult: Left ankle OM    Principal Problem:   Septic arthritis of ankle (HCC) Active Problems:   Essential hypertension   Idiopathic peripheral neuropathy   Mixed hyperlipidemia   Prediabetes   Fatty liver   Gastroesophageal reflux disease   Gout   Charcot's joint of left foot   Hyponatremia   Hypokalemia   Transaminitis   Mild protein malnutrition (HCC)   Hyperbilirubinemia   Thrombocytopenia (HCC)   Assessment: 67 year old female with history of stroke on the right foot, left ankle swelling for about a year per patient, gout, nonalcoholic fatty liver, CKD admitted with: #Left ankle septic arthritis, concern for osteomyelitis status post I&D on 11/28 - Patient had presented with a 2-day history of left ankle pain/swelling/erythema.  Patient states her left ankle has been swelling on and off for about a year.  She also notes she has right foot wound that has been managed by wound care. - Underwent arthrocentesis of left ankle in the ED with cultures growing Klebsiella pneumonia. - Taken to the OR with Dr. Victorino Dike for I&D of left ankle for septic arthritis, cultures sent  Recommendations:  -Continue vancomycin - Add ceftriaxone - Follow OR cultures - Place [facility] anticipate 4 weeks of antibiotics Microbiology:   Antibiotics: Vancomycin 11/27-   Cultures: Blood  Urine  Other 11/27 Klebsiella pneumonia 11/28 OR cultures pending  HPI: Evelyn Campbell is a 67 y.o. female with history of nonalcoholic fatty liver, CKD, gallbladder stone, GERD, hyperlipidemia, hypertension, obesity, lymphadenopathy, pneumonia, vitamin D UC Charcot's foot, peripheral neuropathy, gout presented to the ED with a 2-day history of worsening edema, erythema and tenderness at left ankle she was unable to walk.  On arrival CBC 17K, no leukocytosis.  Left  ankle x-ray showed extensive erosion obstruction of the distal talus and anterior process calcaneus and tarsal bones midfoot.  If after follow-up versus such as osteomyelitis favored.  She underwent aspiration of the left ankle in the ED, culture sent.  Taken to the OR with Dr. Here for I&D of left ankle, per OR note cloudy blood-tinged straw-colored fluid was collected and sent for cultures.   Review of Systems: Review of Systems  All other systems reviewed and are negative.   Past Medical History:  Diagnosis Date   Arthritis    R ankle    Back pain    Chronic kidney disease    seen by Nephrology at [facility], due to all her numbers were"off", she  remarks that she was sent to superhydrate & then her labs values corrected.  Pt. will f/u /w nephrology   Constipation    Fatty liver    Gallbladder problem    GERD (gastroesophageal reflux disease)    occ   Gout    Hyperlipidemia    Hypertension    Joint pain    Lower extremity edema    Obesity    Peripheral neuropathy    Pneumonia 1980's   hx- not needed hospitalization    Vitamin D deficiency     Social History   Tobacco Use   Smoking status: Never   Smokeless tobacco: Never  Vaping Use   Vaping status: Never Used  Substance Use Topics   Alcohol use: Yes    Comment: wine for special occasion    Drug  use: No    Family History  Problem Relation Age of Onset   Heart failure Father    Sudden death Father    Scheduled Meds:  allopurinol  300 mg Oral Daily   atenolol  100 mg Oral QHS   Chlorhexidine Gluconate Cloth  6 each Topical Daily   cholecalciferol  2,000 Units Oral Daily   docusate sodium  100 mg Oral BID   enoxaparin (LOVENOX) injection  30 mg Subcutaneous Q24H   feeding supplement (GLUCERNA SHAKE)  237 mL Oral Q24H   gabapentin  300 mg Oral Daily   And   gabapentin  900 mg Oral QHS   mupirocin ointment  1 Application Nasal BID   niacin  2,000 mg Oral QHS   omega-3 acid ethyl esters  2 g Oral BID    pantoprazole  40 mg Oral Daily   Continuous Infusions:  cefTRIAXone (ROCEPHIN)  IV 2 g (09/30/23 0952)   vancomycin 1,250 mg (09/30/23 1140)   PRN Meds:.acetaminophen **OR** acetaminophen, metoCLOPramide **OR** metoCLOPramide (REGLAN) injection, morphine injection, ondansetron **OR** ondansetron (ZOFRAN) IV, oxyCODONE Allergies  Allergen Reactions   Celebrex [Celecoxib] Diarrhea and Other (See Comments)    Cause bloody stools   Ibuprofen Diarrhea and Other (See Comments)    Caused bloody stools   Meloxicam Diarrhea and Other (See Comments)    Caused bloody stools   Amoxicillin-Pot Clavulanate    Lipitor [Atorvastatin] Other (See Comments)    myalgia   Nsaids     Other reaction(s): Unknown   Statins Other (See Comments)    myalgia   Amoxicillin Itching and Rash   Penicillins Rash    OBJECTIVE: Blood pressure (!) 106/92, pulse 79, temperature 98.6 F (37 C), resp. rate 18, height 5\' 5"  (1.651 m), weight 100.7 kg, SpO2 97%.  Physical Exam Constitutional:      Appearance: Normal appearance.  HENT:     Head: Normocephalic and atraumatic.     Right Ear: Tympanic membrane normal.     Left Ear: Tympanic membrane normal.     Nose: Nose normal.     Mouth/Throat:     Mouth: Mucous membranes are moist.  Eyes:     Extraocular Movements: Extraocular movements intact.     Conjunctiva/sclera: Conjunctivae normal.     Pupils: Pupils are equal, round, and reactive to light.  Cardiovascular:     Rate and Rhythm: Normal rate and regular rhythm.     Heart sounds: No murmur heard.    No friction rub. No gallop.  Pulmonary:     Effort: Pulmonary effort is normal.     Breath sounds: Normal breath sounds.  Abdominal:     General: Abdomen is flat.     Palpations: Abdomen is soft.  Musculoskeletal:     Comments: Left leg in  air cast wit wound vac  Skin:    General: Skin is warm and dry.  Neurological:     General: No focal deficit present.     Mental Status: She is alert and  oriented to person, place, and time.  Psychiatric:        Mood and Affect: Mood normal.     Lab Results Lab Results  Component Value Date   WBC 8.5 10/01/2023   HGB 10.3 (L) 10/01/2023   HCT 31.1 (L) 10/01/2023   MCV 90.1 10/01/2023   PLT 149 (L) 10/01/2023    Lab Results  Component Value Date   CREATININE 0.70 09/30/2023   BUN 18 09/30/2023   NA  134 (L) 09/30/2023   K 3.8 09/30/2023   CL 104 09/30/2023   CO2 22 09/30/2023    Lab Results  Component Value Date   ALT 40 09/30/2023   AST 32 09/30/2023   ALKPHOS 131 (H) 09/30/2023   BILITOT 0.8 09/30/2023       Danelle Earthly, MD [facility] 10/01/2023, 4:41 AM I have personally spent 82 minutes involved in face-to-face and non-face-to-face activities for this patient on the day of the visit. Professional time spent includes the following activities: Preparing to see the patient (review of tests), Obtaining and/or reviewing separately obtained history (admission/discharge record), Performing a medically appropriate examination and/or evaluation , Ordering medications/tests/procedures, referring and communicating with other health care professionals, Documenting clinical information in the EMR, Independently interpreting results (not separately reported), Communicating results to the patient/family/caregiver, Counseling and educating the patient/family/caregiver and Care coordination (not separately reported).

## 2023-10-01 NOTE — Progress Notes (Signed)
    Subjective:  Patient reports pain as mild to moderate.  Denies N/V/CP/SOB/Abd pain. She denies any tingling or numbness in LE bilaterally. Reports difficulty with movement. We discussed quad sets in the bed.   Objective:   VITALS:   Vitals:   09/30/23 1401 09/30/23 1755 09/30/23 1927 10/01/23 0504  BP: 108/79 120/85 (!) 106/92 139/82  Pulse: 73 62 79 73  Resp: 15 15 18 18   Temp: 98.4 F (36.9 C) 98.3 F (36.8 C) 98.6 F (37 C) 98.9 F (37.2 C)  TempSrc: Oral Oral  Oral  SpO2: 93% (!) 65% 97% 99%  Weight:      Height:        Patient lying in bed using incentive spirometry. NAD.  Neurologically intact ABD soft Neurovascular intact Sensation intact distally Intact pulses distally Dorsiflexion/Plantar flexion intact No cellulitis present Compartment soft Cam boot on LLE.  JP drain 5 cc SS output. Continue JP drain.   Lab Results  Component Value Date   WBC 8.5 10/01/2023   HGB 10.3 (L) 10/01/2023   HCT 31.1 (L) 10/01/2023   MCV 90.1 10/01/2023   PLT 149 (L) 10/01/2023   BMET    Component Value Date/Time   NA 133 (L) 10/01/2023 0404   NA 142 02/17/2021 0000   K 4.2 10/01/2023 0404   CL 100 10/01/2023 0404   CO2 26 10/01/2023 0404   GLUCOSE 108 (H) 10/01/2023 0404   BUN 15 10/01/2023 0404   BUN 13 02/17/2021 0000   CREATININE 0.67 10/01/2023 0404   CALCIUM 8.7 (L) 10/01/2023 0404   GFRNONAA >60 10/01/2023 0404     Assessment/Plan: 2 Days Post-Op   Principal Problem:   Septic arthritis of ankle (HCC) Active Problems:   Essential hypertension   Idiopathic peripheral neuropathy   Mixed hyperlipidemia   Prediabetes   Fatty liver   Gastroesophageal reflux disease   Gout   Charcot's joint of left foot   Hyponatremia   Hypokalemia   Transaminitis   Mild protein malnutrition (HCC)   Hyperbilirubinemia   Thrombocytopenia (HCC)   WBAT in cam boot.  DVT ppx: Lovenox, SCDs, TEDS PO pain control PT/OT: Mobilize with PT.  Dispo:  - Patient  under care of the medical team.  - Continue JP drain and monitor output.  - Gram stain gram positive cocci in cluster. Cultures pending. Antibiotics per ID recommendation.    Clois Dupes, PA-C 10/01/2023, 7:27 AM   [facility]  [facility] [address], [place], [place] [address] Phone: [phone] [email or web address] Facebook  [employer]

## 2023-10-01 NOTE — Progress Notes (Signed)
Peripherally Inserted Central Catheter Placement  The IV Nurse has discussed with the patient and/or persons authorized to consent for the patient, the purpose of this procedure and the potential benefits and risks involved with this procedure.  The benefits include less needle sticks, lab draws from the catheter, and the patient may be discharged home with the catheter. Risks include, but not limited to, infection, bleeding, blood clot (thrombus formation), and puncture of an artery; nerve damage and irregular heartbeat and possibility to perform a PICC exchange if needed/ordered by physician.  Alternatives to this procedure were also discussed.  Bard Power PICC patient education guide, fact sheet on infection prevention and patient information card has been provided to patient /or left at bedside.    PICC Placement Documentation  PICC Single Lumen 10/01/23 Right Basilic 36 cm 0 cm (Active)  Indication for Insertion or Continuance of Line Home intravenous therapies (PICC only) 10/01/23 1255  Exposed Catheter (cm) 0 cm 10/01/23 1255  Site Assessment Clean, Dry, Intact 10/01/23 1255  Line Status Flushed;Saline locked;Blood return noted 10/01/23 1255  Dressing Type Transparent;Securing device 10/01/23 1255  Dressing Status Antimicrobial disc in place;Clean, Dry, Intact 10/01/23 1255  Line Care Connections checked and tightened 10/01/23 1255  Line Adjustment (NICU/IV Team Only) No 10/01/23 1255  Dressing Intervention New dressing 10/01/23 1255  Dressing Change Due 10/09/23 10/01/23 1255       Elliot Dally 10/01/2023, 12:56 PM

## 2023-10-01 NOTE — Anesthesia Postprocedure Evaluation (Signed)
Anesthesia Post Note  Patient: Evelyn Campbell  Procedure(s) Performed: INCISION AND DRAINAGE LEFT ANKLE (Left)     Patient location during evaluation: PACU Anesthesia Type: General Level of consciousness: awake Pain management: pain level controlled Vital Signs Assessment: post-procedure vital signs reviewed and stable Respiratory status: spontaneous breathing, nonlabored ventilation and respiratory function stable Cardiovascular status: blood pressure returned to baseline and stable Postop Assessment: no apparent nausea or vomiting Anesthetic complications: no   No notable events documented.  Last Vitals:  Vitals:   09/30/23 1927 10/01/23 0504  BP: (!) 106/92 139/82  Pulse: 79 73  Resp: 18 18  Temp: 37 C 37.2 C  SpO2: 97% 99%    Last Pain:  Vitals:   10/01/23 0638  TempSrc:   PainSc: 6                  Maricel Swartzendruber P Vinaya Sancho

## 2023-10-02 DIAGNOSIS — B958 Unspecified staphylococcus as the cause of diseases classified elsewhere: Secondary | ICD-10-CM | POA: Diagnosis not present

## 2023-10-02 DIAGNOSIS — M00272 Other streptococcal arthritis, left ankle and foot: Secondary | ICD-10-CM | POA: Diagnosis not present

## 2023-10-02 DIAGNOSIS — M009 Pyogenic arthritis, unspecified: Secondary | ICD-10-CM | POA: Diagnosis not present

## 2023-10-02 DIAGNOSIS — B961 Klebsiella pneumoniae [K. pneumoniae] as the cause of diseases classified elsewhere: Secondary | ICD-10-CM | POA: Diagnosis not present

## 2023-10-02 NOTE — TOC Progression Note (Signed)
Transition of Care [facility]) - Progression Note    Patient Details  Name: Evelyn Campbell MRN: [id number] Date of Birth: 17-Jan-1956  [facility]) CM/SW Contact  Darleene Cleaver, [place] Phone Number: 10/02/2023, 7:14 PM  Clinical Narrative:     CSW spoke with patient and her husband regarding PT recommendations for SNF.  Patient and husband are agreeable to SNF workup.    CSW explained how insurance will pay for stay, and also how to review SNFs that offer short term rehab.  Per patient and her husband she has not been to SNF before, they would prefer a location close to [place] if possible.  CSW explained that facilities will make their decision of offering a bed or not based on medical record, medications, insurance, and the potential for rehab.  CSW advised that they start reviewing Medicare's website to see star ratings for facilities and to come up with 2 or 3 options in case their first choice is not available.  Patinet's husband requested that he Medicare website be emailed to him, CSW sent secure email to patient's husband with the website address, his email is [email or web address].  Patient and husband asked when patient will be ready for discharge, CSW informed them it will depend on when she is medically ready for discharge and when SNFs have bed available.  CSW explained how patient will get to SNF, and what to expect while she is there.  CSW to begin bed search in [facility].  TOC to continue to follow patient's progress throughout discharge planning.  Expected Discharge Plan:  (TBD) Barriers to Discharge: Continued Medical Work up  Expected Discharge Plan and Services  SNF for short term rehab and IV antibiotics.  In-house Referral: Clinical Social Work     Living arrangements for the past 2 months: Single Family Home                                       Social Determinants of Health (SDOH) Interventions SDOH Screenings   Food Insecurity:  No Food Insecurity (09/29/2023)  Housing: Low Risk  (09/29/2023)  Transportation Needs: No Transportation Needs (09/29/2023)  Utilities: Not At Risk (09/29/2023)  Depression (PHQ2-9): Medium Risk (05/22/2020)  Tobacco Use: Low Risk  (09/29/2023)    Readmission Risk Interventions     No data to display

## 2023-10-02 NOTE — [place] FL2 (Signed)
[place] MEDICAID FL2 LEVEL OF CARE FORM     IDENTIFICATION  Patient Name: Evelyn Campbell Birthdate: July 15, 1956 Sex: female Admission Date (Current Location): 09/28/2023  [facility] and [place] Number:  Producer, television/film/video and Address:  [facility],  [address], [place] [address]      Provider Number: [id number]  Attending Physician Name and Address:  Alba Cory, MD  Relative Name and Phone Number:  Lynnaya, Lappin [phone]    [facility]   [phone]    Current Level of Care: Hospital Recommended Level of Care: Skilled Nursing Facility Prior Approval Number:    Date Approved/Denied:   PASRR Number: [id number]  Discharge Plan: SNF    Current Diagnoses: Patient Active Problem List   Diagnosis Date Noted   Septic arthritis of ankle (HCC) 09/29/2023   Hyponatremia 09/29/2023   Hypokalemia 09/29/2023   Transaminitis 09/29/2023   Mild protein malnutrition (HCC) 09/29/2023   Hyperbilirubinemia 09/29/2023   Thrombocytopenia (HCC) 09/29/2023   Abnormal liver function tests 05/25/2022   Abnormal weight gain 05/25/2022   Arthritis 05/25/2022   Body mass index (BMI) 38.0-38.9, adult 05/25/2022   Change in bowel habit 05/25/2022   Colon cancer screening 05/25/2022   Difficulty walking 05/25/2022   Drug-induced myopathy 05/25/2022   Fatty liver 05/25/2022   Flatulence, eructation and gas pain 05/25/2022   Gastroesophageal reflux disease 05/25/2022   Gout 05/25/2022   Hardening of the aorta (main artery of the heart) (HCC) 05/25/2022   Lymphedema 05/25/2022   Neuropathy 05/25/2022   Other long term (current) drug therapy 05/25/2022   Other specified disorders of kidney and ureter 05/25/2022   Periumbilical pain 05/25/2022   Rectal bleeding 05/25/2022   Ulcer of right foot with fat layer exposed (HCC) 05/25/2022   Lymphedema of left leg 04/21/2021   Idiopathic chronic gout of multiple sites without tophus 03/10/2021    Diverticular disease of colon 01/08/2021   Idiopathic peripheral neuropathy 01/08/2021   Mixed hyperlipidemia 01/08/2021   Peripheral venous insufficiency 01/08/2021   History of colonic polyps 01/08/2021   Vitamin D deficiency 01/08/2021   Metabolic syndrome 01/08/2021   Prediabetes 01/08/2021   Visceral obesity 12/25/2020   Screening for viral disease 05/18/2019   Other ganglion and cyst of synovium, tendon, and bursa 05/03/2019   Foot mass, right 05/01/2019   Degenerative arthritis of right ankle 10/05/2016   Incisional hernia 07/02/2015   Left adrenal mass (HCC) 08/20/2014   Essential hypertension 06/14/2014   Family history of ischemic heart disease 06/14/2014   Class 3 severe obesity with serious comorbidity and body mass index (BMI) of 40.0 to 44.9 in adult (HCC) 06/14/2014   Status post ankle arthrodesis 05/07/2014   Posterior equinus, acquired 02/05/2014   Charcot's joint of left foot 02/05/2014   Pes planovalgus, acquired 05/10/2013   Posterior tibial tendon dysfunction 05/10/2013   Primary osteoarthritis, unspecified ankle and foot 05/10/2013    Orientation RESPIRATION BLADDER Height & Weight     Self, Time, Situation, Place  Normal Continent Weight: 222 lb 0.1 oz (100.7 kg) Height:  5\' 5"  (165.1 cm)  BEHAVIORAL SYMPTOMS/MOOD NEUROLOGICAL BOWEL NUTRITION STATUS    Grand mal Continent Diet (Regular diet)  AMBULATORY STATUS COMMUNICATION OF NEEDS Skin   Limited Assist Verbally Surgical wounds                       Personal Care Assistance Level of Assistance  Feeding, Dressing, Bathing Bathing Assistance: Limited assistance Feeding assistance: Independent Dressing  Assistance: Limited assistance     Functional Limitations Info             SPECIAL CARE FACTORS FREQUENCY  PT (By licensed PT), OT (By licensed OT)     PT Frequency: Minimum 5x a week OT Frequency: Minimum 5x a week            Contractures Contractures Info: Not present     Additional Factors Info  Code Status, Allergies Code Status Info: Full Code Allergies Info: Celebrex (Celecoxib)  Ibuprofen  Meloxicam  Amoxicillin-pot Clavulanate  Lipitor (Atorvastatin)  Nsaids  Statins  Amoxicillin  Penicillins           Current Medications (10/02/2023):  This is the current hospital active medication list Current Facility-Administered Medications  Medication Dose Route Frequency Provider Last Rate Last Admin   acetaminophen (TYLENOL) tablet 650 mg  650 mg Oral Q6H PRN Toni Arthurs, MD       Or   acetaminophen (TYLENOL) suppository 650 mg  650 mg Rectal Q6H PRN Toni Arthurs, MD       allopurinol (ZYLOPRIM) tablet 300 mg  300 mg Oral Daily Toni Arthurs, MD   300 mg at 10/02/23 0841   atenolol (TENORMIN) tablet 100 mg  100 mg Oral QHS Toni Arthurs, MD   100 mg at 10/01/23 2159   cefTRIAXone (ROCEPHIN) 2 g in sodium chloride 0.9 % 100 mL IVPB  2 g Intravenous Q24H Danelle Earthly, MD 200 mL/hr at 10/02/23 1306 2 g at 10/02/23 1306   Chlorhexidine Gluconate Cloth 2 % PADS 6 each  6 each Topical Daily Bobette Mo, MD   6 each at 10/02/23 [id number]   cholecalciferol (VITAMIN D3) 25 MCG (1000 UNIT) tablet 2,000 Units  2,000 Units Oral Daily Toni Arthurs, MD   2,000 Units at 10/02/23 0841   docusate sodium (COLACE) capsule 100 mg  100 mg Oral BID Toni Arthurs, MD   100 mg at 10/02/23 0841   enoxaparin (LOVENOX) injection 40 mg  40 mg Subcutaneous Q24H Regalado, Belkys A, MD   40 mg at 10/01/23 2158   feeding supplement (GLUCERNA SHAKE) (GLUCERNA SHAKE) liquid 237 mL  237 mL Oral Q24H Regalado, Belkys A, MD   237 mL at 10/02/23 0842   gabapentin (NEURONTIN) capsule 300 mg  300 mg Oral Daily Toni Arthurs, MD   300 mg at 10/02/23 1308   And   gabapentin (NEURONTIN) capsule 900 mg  900 mg Oral QHS Toni Arthurs, MD   900 mg at 10/01/23 2200   metoCLOPramide (REGLAN) tablet 5-10 mg  5-10 mg Oral Q8H PRN Toni Arthurs, MD       Or   metoCLOPramide (REGLAN) injection 5-10 mg   5-10 mg Intravenous Q8H PRN Toni Arthurs, MD       morphine (PF) 2 MG/ML injection 2 mg  2 mg Intravenous Q3H PRN Regalado, Belkys A, MD       mupirocin ointment (BACTROBAN) 2 % 1 Application  1 Application Nasal BID Bobette Mo, MD   1 Application at 10/02/23 6578   niacin (VITAMIN B3) ER tablet 2,000 mg  2,000 mg Oral QHS Toni Arthurs, MD   2,000 mg at 10/01/23 2200   omega-3 acid ethyl esters (LOVAZA) capsule 2 g  2 g Oral BID Toni Arthurs, MD   2 g at 10/02/23 0841   ondansetron (ZOFRAN) tablet 4 mg  4 mg Oral Q6H PRN Toni Arthurs, MD       Or   ondansetron [facility])  injection 4 mg  4 mg Intravenous Q6H PRN Toni Arthurs, MD       oxyCODONE (Oxy IR/ROXICODONE) immediate release tablet 5 mg  5 mg Oral Q4H PRN Toni Arthurs, MD   5 mg at 10/02/23 6962   pantoprazole (PROTONIX) EC tablet 40 mg  40 mg Oral Daily Toni Arthurs, MD   40 mg at 10/02/23 9528   polyethylene glycol (MIRALAX / GLYCOLAX) packet 17 g  17 g Oral Daily Regalado, Belkys A, MD   17 g at 10/01/23 1322   sodium chloride flush (NS) 0.9 % injection 10-40 mL  10-40 mL Intracatheter PRN Regalado, Belkys A, MD       vancomycin (VANCOREADY) IVPB 1250 mg/250 mL  1,250 mg Intravenous Q24H Gadhia, Jigna M, RPH 166.7 mL/hr at 10/02/23 1358 1,250 mg at 10/02/23 1358     Discharge Medications: Please see discharge summary for a list of discharge medications.  Relevant Imaging Results:  Relevant Lab Results:   Additional Information SSN [id number]  Darleene Cleaver, LCSW

## 2023-10-02 NOTE — Progress Notes (Signed)
Subjective: 3 Days Post-Op Procedure(s) (LRB): INCISION AND DRAINAGE LEFT ANKLE (Left) Patient reports pain as 4 on 0-10 scale.   Denies CP or SOB.  Voiding without difficulty. Positive flatus. JP 5 - 10 cc./shift Objective: Vital signs in last 24 hours: Temp:  [97.7 F (36.5 C)-98.6 F (37 C)] 98.6 F (37 C) (12/01 0542) Pulse Rate:  [73-77] 77 (12/01 0542) Resp:  [17] 17 (12/01 0542) BP: (115-153)/(71-89) 153/89 (12/01 0542) SpO2:  [94 %-100 %] 94 % (12/01 0542)  Intake/Output from previous day: 11/30 0701 - 12/01 0700 In: 950 [P.O.:600; IV Piggyback:350] Out: 2060 [Urine:2050; Drains:10] Intake/Output this shift: No intake/output data recorded.  Recent Labs    09/30/23 0348 10/01/23 0404  HGB 11.1* 10.3*   Recent Labs    09/30/23 0348 10/01/23 0404  WBC 8.9 8.5  RBC 3.69* 3.45*  HCT 32.9* 31.1*  PLT 108* 149*   Recent Labs    09/30/23 0348 10/01/23 0404  NA 134* 133*  K 3.8 4.2  CL 104 100  CO2 22 26  BUN 18 15  CREATININE 0.70 0.67  GLUCOSE 96 108*  CALCIUM 8.6* 8.7*   No results for input(s): "LABPT", "INR" in the last 72 hours.  Conmpartments soft. Good capillary refill DF/PF intact Sensation diminished due to neuropathy  Assessment/Plan:  3 Days Post-Op Procedure(s) (LRB): INCISION AND DRAINAGE LEFT ANKLE (Left) Advance diet JP D/C 'd tip intact  2 Day Post-Op    Principal Problem:   Septic arthritis of ankle (HCC) Active Problems:   Essential hypertension   Idiopathic peripheral neuropathy   Mixed hyperlipidemia   Prediabetes   Fatty liver   Gastroesophageal reflux disease   Gout   Charcot's joint of left foot   Hyponatremia   Hypokalemia   Transaminitis   Mild protein malnutrition (HCC)   Hyperbilirubinemia   Thrombocytopenia (HCC)     WBAT in cam boot.  DVT ppx: Lovenox, SCDs, TEDS PO pain control PT/OT: Mobilize with PT.  Dispo:  - Patient under care of the medical team.  -   - Gram stain gram positive cocci in  cluster. Cultures pending. Antibiotics per ID recommendation.     SNF or rehab pending  Principal Problem:   Septic arthritis of ankle (HCC) Active Problems:   Essential hypertension   Idiopathic peripheral neuropathy   Mixed hyperlipidemia   Prediabetes   Fatty liver   Gastroesophageal reflux disease   Gout   Charcot's joint of left foot   Hyponatremia   Hypokalemia   Transaminitis   Mild protein malnutrition (HCC)   Hyperbilirubinemia   Thrombocytopenia (HCC)      Javier Docker 10/02/2023, @NOW 

## 2023-10-02 NOTE — Plan of Care (Signed)
  Problem: Clinical Measurements: Goal: Ability to maintain clinical measurements within normal limits will improve Outcome: Progressing   Problem: Activity: Goal: Risk for activity intolerance will decrease Outcome: Progressing   Problem: Nutrition: Goal: Adequate nutrition will be maintained Outcome: Progressing   Problem: Coping: Goal: Level of anxiety will decrease Outcome: Progressing   Problem: Elimination: Goal: Will not experience complications related to urinary retention Outcome: Adequate for Discharge   Problem: Pain Management: Goal: General experience of comfort will improve Outcome: Progressing   Problem: Safety: Goal: Ability to remain free from injury will improve Outcome: Progressing

## 2023-10-02 NOTE — Progress Notes (Signed)
PROGRESS NOTE    Evelyn Campbell  WUJ:[id number] DOB: 09/28/1956 DOA: 09/28/2023 PCP: Gweneth Dimitri, MD   Brief Narrative: 67 year old with past medical history significant for nonalcoholic fatty liver, CKD, gallbladder disease, GERD, hyperlipidemia, hypertension, lower extremity edema, obesity, history of pneumonia, vitamin D deficiency, peripheral neuropathy, gout, right Charcot foot, chronic edema of the left ankle who presents to the emergency department complaining of 2 days history of progressively worsening edema, erythema and tenderness on her left ankle, unable to ambulate.  Evaluation in the ED arthrocentesis was performed synovial fluid show few gram-positive cocci, white blood cell 3350.  Left ankle x-rays show extensive erosion with obstruction of the distal talus, anterior process calcaneus and tarsal bone of the midfoot.  Orthopedic consulted for septic arthritis of the left ankle patient was taken to the OR for left ankle arthrotomy with irrigation and excisional debridement 11/28.   Assessment & Plan:   Principal Problem:   Septic arthritis of ankle (HCC) Active Problems:   Essential hypertension   Idiopathic peripheral neuropathy   Mixed hyperlipidemia   Prediabetes   Fatty liver   Gastroesophageal reflux disease   Gout   Charcot's joint of left foot   Hyponatremia   Hypokalemia   Transaminitis   Mild protein malnutrition (HCC)   Hyperbilirubinemia   Thrombocytopenia (HCC)  1-Left ankle septic arthritis, klebsiella Pneumoniae And Staph Aureus.  History of Charcot joint of the left foot -Patient presented with worsening pain, edema, redness left ankle. -Arthrocentesis synovial fluid: Showed: rare klebsiella Pneumoniae And Staph Aureus.  -Underwent left ankle arthrotomy with irrigation and excisional debridement by Dr. Victorino Dike 09/29/2023 -IV Vancomycin, Ceftriaxone Ortho following.  Culture form I and D ; Staph Aureus.  ID consulted, recommend 4 weeks IV  antibiotics.  Needs rehab, awaiting final culture report.   Hyponatremia: -Suspect hypovolemia.  Continue with IV fluids -Improving  Hypokalemia:  -Replaced  Transaminases, Hyperbilirubinemia; Fatty Liver -In setting infection. Follow trend.  -Liver function test trending down -She is on 2000 mg of slow release niacin  Mild protein malnutrition -Needs protein supplements  Thrombocytopenia -Suspect related to infection. Monitor count.  Improving.   Hypertension; -Continue with atenolol and will hold Norvasc Norvasc, systolic blood pressure in the 130 range  Idiopathic peripheral neuropathy -Continue gabapentin  Hyperlipidemia -Niaspan  Prediabetes -Carb modified diet -A1c  GERD -Continue with PPI  Gout: -Continue with allopurinol  Right foot; small open wound. Wound care consulted  Estimated body mass index is 36.94 kg/m as calculated from the following:   Height as of this encounter: 5\' 5"  (1.651 m).   Weight as of this encounter: 100.7 kg.   DVT prophylaxis: Lovenox Code Status: Full code Family Communication: Husband at bedside 11/29.  Disposition Plan:  Status is: Inpatient Remains inpatient appropriate because: Management of septic arthritis    Consultants:  Dr Victorino Dike  Procedures:  I and D ankle.   Antimicrobials:    Subjective: Pain controlled. She has small open wound right foot Had BM  Objective: Vitals:   10/01/23 0504 10/01/23 1404 10/01/23 2041 10/02/23 0542  BP: 139/82 115/71 (!) 144/87 (!) 153/89  Pulse: 73 73 73 77  Resp: 18 17 17 17   Temp: 98.9 F (37.2 C) 98.3 F (36.8 C) 97.7 F (36.5 C) 98.6 F (37 C)  TempSrc: Oral     SpO2: 99% 98% 100% 94%  Weight:      Height:        Intake/Output Summary (Last 24 hours) at 10/02/2023 9562 Last data filed  at 10/02/2023 0600 Gross per 24 hour  Intake 949.95 ml  Output 2060 ml  Net -1110.05 ml   Filed Weights   09/28/23 1943 09/29/23 1617  Weight: 100.7 kg 100.7 kg     Examination:  General exam: NAD Respiratory system:  CTA. Cardiovascular system: S 1, S 2 RRR Gastrointestinal system: BS present, soft, nt Extremities: Left LE with dressing and cam  boot.   Data Reviewed: I have personally reviewed following labs and imaging studies  CBC: Recent Labs  Lab 09/28/23 2216 09/30/23 0348 10/01/23 0404  WBC 17.1* 8.9 8.5  NEUTROABS 13.9*  --   --   HGB 12.4 11.1* 10.3*  HCT 37.0 32.9* 31.1*  MCV 89.2 89.2 90.1  PLT 118* 108* 149*   Basic Metabolic Panel: Recent Labs  Lab 09/28/23 2216 09/30/23 0348 10/01/23 0404  NA 131* 134* 133*  K 3.2* 3.8 4.2  CL 96* 104 100  CO2 24 22 26   GLUCOSE 114* 96 108*  BUN 23 18 15   CREATININE 0.97 0.70 0.67  CALCIUM 9.3 8.6* 8.7*   GFR: Estimated Creatinine Clearance: 78.1 mL/min (by C-G formula based on SCr of 0.67 mg/dL). Liver Function Tests: Recent Labs  Lab 09/28/23 2216 09/30/23 0348  AST 51* 32  ALT 54* 40  ALKPHOS 105 131*  BILITOT 1.4* 0.8  PROT 6.4* 5.5*  ALBUMIN 3.2* 2.4*   No results for input(s): "LIPASE", "AMYLASE" in the last 168 hours. No results for input(s): "AMMONIA" in the last 168 hours. Coagulation Profile: No results for input(s): "INR", "PROTIME" in the last 168 hours. Cardiac Enzymes: No results for input(s): "CKTOTAL", "CKMB", "CKMBINDEX", "TROPONINI" in the last 168 hours. BNP (last 3 results) No results for input(s): "PROBNP" in the last 8760 hours. HbA1C: Recent Labs    09/30/23 0853  HGBA1C 5.2   CBG: No results for input(s): "GLUCAP" in the last 168 hours. Lipid Profile: No results for input(s): "CHOL", "HDL", "LDLCALC", "TRIG", "CHOLHDL", "LDLDIRECT" in the last 72 hours. Thyroid Function Tests: No results for input(s): "TSH", "T4TOTAL", "FREET4", "T3FREE", "THYROIDAB" in the last 72 hours. Anemia Panel: No results for input(s): "VITAMINB12", "FOLATE", "FERRITIN", "TIBC", "IRON", "RETICCTPCT" in the last 72 hours. Sepsis Labs: No results for  input(s): "PROCALCITON", "LATICACIDVEN" in the last 168 hours.  Recent Results (from the past 240 hour(s))  Body fluid culture w Gram Stain     Status: None (Preliminary result)   Collection Time: 09/28/23 10:36 PM   Specimen: Ankle; Body Fluid  Result Value Ref Range Status   Specimen Description   Final    ANKLE Performed at [facility], [address], [place], [place] [address]    Special Requests   Final    NONE Performed at [facility], [address], [place], [place] [address]    Gram Stain   Final    MODERATE WBC PRESENT,BOTH PMN AND MONONUCLEAR FEW GRAM POSITIVE COCCI    Culture   Final    MODERATE STAPHYLOCOCCUS AUREUS RARE KLEBSIELLA PNEUMONIAE CRITICAL RESULT CALLED TO, READ BACK BY AND VERIFIED WITH: RN [place] W. [id number] FCP SUSCEPTIBILITIES TO FOLLOW Performed at [facility], [address], [place], [place] [address]    Report Status PENDING  Incomplete  Surgical pcr screen     Status: Abnormal   Collection Time: 09/29/23 11:34 AM   Specimen: Nasal Mucosa; Nasal Swab  Result Value Ref Range Status   MRSA, PCR NEGATIVE NEGATIVE Final   Staphylococcus aureus POSITIVE (  A) NEGATIVE Final    Comment: (NOTE) The Xpert SA Assay (FDA approved for NASAL specimens in patients 26 years of age and older), is one component of a comprehensive surveillance program. It is not intended to diagnose infection nor to guide or monitor treatment. Performed at [facility], [address], [place], [place] [address]   Aerobic/Anaerobic Culture w Gram Stain (surgical/deep wound)     Status: None (Preliminary result)   Collection Time: 09/29/23  5:13 PM   Specimen: PATH Cytology Misc. fluid; Body Fluid  Result Value Ref Range Status   Specimen Description   Final    FLUID LEFT ANKLE EFFUSION Performed at [facility], [address], [place], [place] [address]    Special Requests   Final     NONE Performed at [facility], [address], [place], [place] [address]    Gram Stain   Final    MODERATE WBC PRESENT, PREDOMINANTLY PMN RARE GRAM POSITIVE COCCI IN CLUSTERS    Culture   Final    MODERATE STAPHYLOCOCCUS AUREUS NO ANAEROBES ISOLATED; CULTURE IN PROGRESS FOR 5 DAYS SUSCEPTIBILITIES TO FOLLOW Performed at [facility], [address], [place], [place] [address]    Report Status PENDING  Incomplete         Radiology Studies: [name] EKG SITE RITE  Result Date: 10/01/2023 If Site Rite image not attached, placement could not be confirmed due to current cardiac rhythm.       Scheduled Meds:  allopurinol  300 mg Oral Daily   atenolol  100 mg Oral QHS   Chlorhexidine Gluconate Cloth  6 each Topical Daily   cholecalciferol  2,000 Units Oral Daily   docusate sodium  100 mg Oral BID   enoxaparin (LOVENOX) injection  40 mg Subcutaneous Q24H   feeding supplement (GLUCERNA SHAKE)  237 mL Oral Q24H   gabapentin  300 mg Oral Daily   And   gabapentin  900 mg Oral QHS   mupirocin ointment  1 Application Nasal BID   niacin  2,000 mg Oral QHS   omega-3 acid ethyl esters  2 g Oral BID   pantoprazole  40 mg Oral Daily   polyethylene glycol  17 g Oral Daily   Continuous Infusions:  cefTRIAXone (ROCEPHIN)  IV Stopped (10/01/23 1347)   vancomycin Stopped (10/01/23 1600)     LOS: 3 days    Time spent: 35 minutes.     Alba Cory, MD [facility]   If 7PM-7AM, please contact night-coverage [email or web address]  10/02/2023, 6:54 AM

## 2023-10-03 DIAGNOSIS — M009 Pyogenic arthritis, unspecified: Secondary | ICD-10-CM | POA: Diagnosis not present

## 2023-10-03 DIAGNOSIS — B961 Klebsiella pneumoniae [K. pneumoniae] as the cause of diseases classified elsewhere: Secondary | ICD-10-CM | POA: Diagnosis not present

## 2023-10-03 DIAGNOSIS — B958 Unspecified staphylococcus as the cause of diseases classified elsewhere: Secondary | ICD-10-CM | POA: Diagnosis not present

## 2023-10-03 DIAGNOSIS — M00272 Other streptococcal arthritis, left ankle and foot: Secondary | ICD-10-CM | POA: Diagnosis not present

## 2023-10-03 LAB — CBC
HCT: 29.8 % — ABNORMAL LOW (ref 36.0–46.0)
Hemoglobin: 10.3 g/dL — ABNORMAL LOW (ref 12.0–15.0)
MCH: 30.2 pg (ref 26.0–34.0)
MCHC: 34.6 g/dL (ref 30.0–36.0)
MCV: 87.4 fL (ref 80.0–100.0)
Platelets: 188 10*3/uL (ref 150–400)
RBC: 3.41 MIL/uL — ABNORMAL LOW (ref 3.87–5.11)
RDW: 14.8 % (ref 11.5–15.5)
WBC: 9.1 10*3/uL (ref 4.0–10.5)
nRBC: 0 % (ref 0.0–0.2)

## 2023-10-03 LAB — BODY FLUID CULTURE W GRAM STAIN

## 2023-10-03 LAB — BASIC METABOLIC PANEL
Anion gap: 9 (ref 5–15)
BUN: 14 mg/dL (ref 8–23)
CO2: 27 mmol/L (ref 22–32)
Calcium: 8.7 mg/dL — ABNORMAL LOW (ref 8.9–10.3)
Chloride: 98 mmol/L (ref 98–111)
Creatinine, Ser: 0.63 mg/dL (ref 0.44–1.00)
GFR, Estimated: >60 mL/min (ref >60)
Glucose, Bld: 98 mg/dL (ref 70–99)
Potassium: 3.8 mmol/L (ref 3.5–5.1)
Sodium: 134 mmol/L — ABNORMAL LOW (ref 135–145)

## 2023-10-03 MED ORDER — SODIUM CHLORIDE 0.9 % IV SOLN
1.0000 g | INTRAVENOUS | Status: DC
  Administered 2023-10-03 – 2023-10-04 (×2): 1 g via INTRAVENOUS
  Filled 2023-10-03 (×2): qty 1000

## 2023-10-03 MED ORDER — SENNA 8.6 MG PO TABS: 2.0000 | ORAL_TABLET | Freq: Two times a day (BID) | ORAL | 0 refills | Status: AC

## 2023-10-03 MED ORDER — OXYCODONE HCL 5 MG PO TABS: 5.0000 mg | ORAL_TABLET | Freq: Four times a day (QID) | ORAL | 0 refills | Status: AC | PRN

## 2023-10-03 NOTE — Consult Note (Signed)
WOC Nurse Consult Note: patient with longstanding history of recurrent ulceration R plantar foot (base of 5th digit); has been followed at [facility] last seen 12/2022 and said wound was closed, has been followed by [facility] for this as well; had R foot x-ray for pain 07/2023 ordered by Dr. Lorin Picket orthopedics  Reason for Consult: R foot wound  Wound type: full thickness diabetic/neuropathic ulcer  Pressure Injury POA: NA  Measurement: 1 cm x 5 cm x 0.1 cm  Wound bed: 75% brown necrotic 25% pink moist  Drainage (amount, consistency, odor) minimal tan exudate  Periwound: heavily callused  Dressing procedure/placement/frequency: Clean R plantar foot wound with NS, apply silver hydrofiber [facility] [place] [phone]) cut to fit wound bed daily and secure with silicone foam. May lift foam daily to replace silver.  Change foam q3 days and prn soiling.   Discussed with patient that she should consider returning to wound care center for treatment of this wound. Patient is frustrated that this has been ongoing for so long.  We discussed that unfortunately this is the nature of such wounds and patient needs debridement of callus/dead tissue and ongoing wound therapy for healing.  Per wound care center notes patient has history of lymphedema as well but has been unable to tolerate compression.    POC discussed with patient and bedside nurse. WOC team will not follow. Re-consult if further needs arise.   Thank you,    Priscella Mann MSN, RN-BC, [employer] [phone]

## 2023-10-03 NOTE — Plan of Care (Signed)
  Problem: Nutrition: Goal: Adequate nutrition will be maintained Outcome: Adequate for Discharge   Problem: Coping: Goal: Level of anxiety will decrease Outcome: Progressing   Problem: Elimination: Goal: Will not experience complications related to urinary retention Outcome: Adequate for Discharge   Problem: Pain Management: Goal: General experience of comfort will improve Outcome: Progressing   Problem: Safety: Goal: Ability to remain free from injury will improve Outcome: Progressing

## 2023-10-03 NOTE — Progress Notes (Signed)
PHARMACY CONSULT NOTE FOR:  OUTPATIENT  PARENTERAL ANTIBIOTIC THERAPY (OPAT)  Indication: Septic arthritis of ankle  Regimen: Ertapenem 1 gm every 24 hours  End date: 11/09/23   IV antibiotic discharge orders are pended. To discharging provider:  please sign these orders via discharge navigator,  Select New Orders & click on the button choice - Manage This Unsigned Work.     Thank you for allowing pharmacy to be a part of this patient's care.  Sharin Mons, PharmD, BCPS, BCIDP Infectious Diseases Clinical Pharmacist Phone: [phone] 10/03/2023, 1:01 PM

## 2023-10-03 NOTE — Progress Notes (Signed)
[facility]  Date of Admission:  09/28/2023   Total days of inpatient antibiotics 4  Principal Problem:   Septic arthritis of ankle (HCC) Active Problems:   Essential hypertension   Idiopathic peripheral neuropathy   Mixed hyperlipidemia   Prediabetes   Fatty liver   Gastroesophageal reflux disease   Gout   Charcot's joint of left foot   Hyponatremia   Hypokalemia   Transaminitis   Mild protein malnutrition (HCC)   Hyperbilirubinemia   Thrombocytopenia (HCC)          Assessment: 67 year old female with history of stroke on the right foot, left ankle swelling for about a year per patient, gout, nonalcoholic fatty liver, CKD admitted with: #Left ankle septic arthritis, concern for osteomyelitis status post I&D on 11/28 - Patient had presented with a 2-day history of left ankle pain/swelling/erythema.  Patient states her left ankle has been swelling on and off for about a year.  She also notes she has right foot wound that has been managed by wound care. - Underwent arthrocentesis of left ankle in the ED with cultures growing Klebsiella pneumonia.and staph aureus - Taken to the OR with Dr. Victorino Dike for I&D of left ankle for septic arthritis, cultures sent   Recommendations:  -Continue vancomycin and ceftriaxone - Follow OR cultures(staph aureus), Aspirate CX+ klebsiella and Staph aureus. - PICC is in place - I discussed with patient and husband that awaiting sensitivities for Staph aureus for final antibiotic plan.  Anticipate 4 weeks of IV therapy.    Microbiology:   Antibiotics: Vancomycin 11/27- Ceftriaxone 11/29-present   Cultures: Blood   Urine   Other 11/27 Klebsiella pneumonia 11/28 OR cultures pending   SUBJECTIVE: Resitng in bed Interval: afebrile overnight  Review of Systems: Review of Systems  All other systems reviewed and are negative.    Scheduled Meds:  allopurinol  300 mg Oral Daily   atenolol  100 mg  Oral QHS   Chlorhexidine Gluconate Cloth  6 each Topical Daily   cholecalciferol  2,000 Units Oral Daily   docusate sodium  100 mg Oral BID   enoxaparin (LOVENOX) injection  40 mg Subcutaneous Q24H   feeding supplement (GLUCERNA SHAKE)  237 mL Oral Q24H   gabapentin  300 mg Oral Daily   And   gabapentin  900 mg Oral QHS   mupirocin ointment  1 Application Nasal BID   niacin  2,000 mg Oral QHS   omega-3 acid ethyl esters  2 g Oral BID   pantoprazole  40 mg Oral Daily   polyethylene glycol  17 g Oral Daily   Continuous Infusions:  cefTRIAXone (ROCEPHIN)  IV 2 g (10/02/23 1306)   vancomycin 1,250 mg (10/02/23 1358)   PRN Meds:.acetaminophen **OR** acetaminophen, metoCLOPramide **OR** metoCLOPramide (REGLAN) injection, morphine injection, ondansetron **OR** ondansetron (ZOFRAN) IV, oxyCODONE, sodium chloride flush Allergies  Allergen Reactions   Celebrex [Celecoxib] Diarrhea and Other (See Comments)    Cause bloody stools   Ibuprofen Diarrhea and Other (See Comments)    Caused bloody stools   Meloxicam Diarrhea and Other (See Comments)    Caused bloody stools   Amoxicillin-Pot Clavulanate    Lipitor [Atorvastatin] Other (See Comments)    myalgia   Nsaids     Other reaction(s): Unknown   Statins Other (See Comments)    myalgia   Amoxicillin Itching and Rash   Penicillins Rash    OBJECTIVE: Vitals:   10/02/23 0542 10/02/23 1325  10/02/23 2004 10/03/23 0513  BP: (!) 153/89 (!) 134/92 132/66 (!) 145/75  Pulse: 77 70 77 81  Resp: 17 20 16 18   Temp: 98.6 F (37 C) 98.3 F (36.8 C) 98.2 F (36.8 C) 99.1 F (37.3 C)  TempSrc:   Oral Oral  SpO2: 94% 95% 96% 93%  Weight:      Height:       Body mass index is 36.94 kg/m.  Physical Exam Constitutional:      Appearance: Normal appearance.  HENT:     Head: Normocephalic and atraumatic.     Right Ear: Tympanic membrane normal.     Left Ear: Tympanic membrane normal.     Nose: Nose normal.     Mouth/Throat:     Mouth:  Mucous membranes are moist.  Eyes:     Extraocular Movements: Extraocular movements intact.     Conjunctiva/sclera: Conjunctivae normal.     Pupils: Pupils are equal, round, and reactive to light.  Cardiovascular:     Rate and Rhythm: Normal rate and regular rhythm.     Heart sounds: No murmur heard.    No friction rub. No gallop.  Pulmonary:     Effort: Pulmonary effort is normal.     Breath sounds: Normal breath sounds.  Abdominal:     General: Abdomen is flat.     Palpations: Abdomen is soft.  Musculoskeletal:     Comments: Ankle wound  Skin:    General: Skin is warm and dry.  Neurological:     General: No focal deficit present.     Mental Status: She is alert and oriented to person, place, and time.  Psychiatric:        Mood and Affect: Mood normal.       Lab Results Lab Results  Component Value Date   WBC 9.1 10/03/2023   HGB 10.3 (L) 10/03/2023   HCT 29.8 (L) 10/03/2023   MCV 87.4 10/03/2023   PLT 188 10/03/2023    Lab Results  Component Value Date   CREATININE 0.63 10/03/2023   BUN 14 10/03/2023   NA 134 (L) 10/03/2023   K 3.8 10/03/2023   CL 98 10/03/2023   CO2 27 10/03/2023    Lab Results  Component Value Date   ALT 40 09/30/2023   AST 32 09/30/2023   ALKPHOS 131 (H) 09/30/2023   BILITOT 0.8 09/30/2023        Danelle Earthly, MD [facility] 10/03/2023, 11:05 AM I have personally spent 51 minutes involved in face-to-face and non-face-to-face activities for this patient on the day of the visit. Professional time spent includes the following activities: Preparing to see the patient (review of tests), Obtaining and/or reviewing separately obtained history (admission/discharge record), Performing a medically appropriate examination and/or evaluation , Ordering medications/tests/procedures, referring and communicating with other health care professionals, Documenting clinical information in the EMR,  Independently interpreting results (not separately reported), Communicating results to the patient/family/caregiver, Counseling and educating the patient/family/caregiver and Care coordination (not separately reported).

## 2023-10-03 NOTE — Progress Notes (Signed)
ID Brief note 11/27 aspirate cultures growing ESBL Klebsiella and MSSA         Staphylococcus aureus Klebsiella pneumoniae      MIC MIC    AMPICILLIN   >=32 RESIST... Resistant    AMPICILLIN/SULBACTAM   >=32 RESIST... Resistant    CEFEPIME   >=32 RESIST... Resistant    CEFTAZIDIME   RESISTANT Resistant    CEFTRIAXONE   >=64 RESIST... Resistant    CIPROFLOXACIN <=0.5 SENSI... Sensitive 1 RESISTANT Resistant    CLINDAMYCIN <=0.25 SENS... Sensitive      ERYTHROMYCIN <=0.25 SENS... Sensitive      GENTAMICIN <=0.5 SENSI... Sensitive >=16 RESIST... Resistant    IMIPENEM   <=0.25 SENS... Sensitive    Inducible Clindamycin NEGATIVE Sensitive      LINEZOLID 1 SENSITIVE Sensitive      OXACILLIN <=0.25 SENS... Sensitive      PIP/TAZO   <=4 SENSITI... Sensitive    RIFAMPIN <=0.5 SENSI... Sensitive      TETRACYCLINE <=1 SENSITIVE Sensitive      TRIMETH/SULFA <=10 SENSIT... Sensitive >=320 RESIS... Resistant    VANCOMYCIN 1 SENSITIVE Sensitive      -Discontinue vancomycin and ceftriaxone - Start ertapenem 1 g every 24 hours for 6 weeks antibiotics given osteomyelitis concern on imaging. - Follow-up with infectious disease clinic 12/17 - Follow-up or cultures, only growing MSSA - ID will sign off  OPAT ORDERS:  Diagnosis: Ankle septic arthritis, possible osteomyelitis  Culture Result: MSSA and ESBL Klebsiella  Allergies  Allergen Reactions   Celebrex [Celecoxib] Diarrhea and Other (See Comments)    Cause bloody stools   Ibuprofen Diarrhea and Other (See Comments)    Caused bloody stools   Meloxicam Diarrhea and Other (See Comments)    Caused bloody stools   Amoxicillin-Pot Clavulanate    Lipitor [Atorvastatin] Other (See Comments)    myalgia   Nsaids     Other reaction(s): Unknown   Statins Other (See Comments)    myalgia   Amoxicillin Itching and Rash   Penicillins Rash     Discharge antibiotics to be given via PICC line:  Per pharmacy protocol ertapenem 1 g every 24  hours   Duration: 6 weeks End Date: 11/09/23  [facility] Care Per Protocol with [employer] Use: Home health RN for IV administration and teaching, line care and labs.    Labs weekly while on IV antibiotics: _x_ CBC with differential __ BMP **TWICE WEEKLY ON VANCOMYCIN  _x_ CMP x__ CRP _x_ ESR __ Vancomycin trough TWICE WEEKLY __ CK  __ Please pull PIC at completion of IV antibiotics __ Please leave PIC in place until doctor has seen patient or been notified  Fax weekly labs to [phone]  Clinic Follow Up Appt: 12/17  @ [facility] with Dr. Thedore Mins

## 2023-10-03 NOTE — Progress Notes (Signed)
Pharmacy Antibiotic Note  Evelyn Campbell is a 67 y.o. female admitted on 09/28/2023 with septic arthritis of left ankle. Pharmacy has been consulted for Vancomycin dosing. S/p L ankle arthrocentesis in ED on 11/27 and L ankle arthrotomy with irrigation and excisional debridement in OR on 11/28.   Vancomycin 1g given in ED 11/28 at 0409 Vancomycin 2g given in ED 11/28 at 0854 Random Vancomycin level ordered 11/29 AM to assess excretion - random Vancomycin level 11/29 at 0348 = 5 mcg/mL SCr stable/WNL at 0.63  Plan: Continue Vancomycin at 1250mg  IV q24h Vancomycin levels at steady state, as indicated Ceftriaxone 2g IV q24h, per ID Monitor renal function, clinical course, ID recs Follow-up culture sensitivities for possible abx de-escalation    Height: 5\' 5"  (165.1 cm) Weight: 100.7 kg (222 lb 0.1 oz) IBW/kg (Calculated) : 57  Temp (24hrs), Avg:98.5 F (36.9 C), Min:98.2 F (36.8 C), Max:99.1 F (37.3 C)  Recent Labs  Lab 09/28/23 2216 09/30/23 0348 10/01/23 0404 10/03/23 0355  WBC 17.1* 8.9 8.5 9.1  CREATININE 0.97 0.70 0.67 0.63  VANCORANDOM  --  5  --   --     Estimated Creatinine Clearance: 78.1 mL/min (by C-G formula based on SCr of 0.63 mg/dL).    Allergies  Allergen Reactions   Celebrex [Celecoxib] Diarrhea and Other (See Comments)    Cause bloody stools   Ibuprofen Diarrhea and Other (See Comments)    Caused bloody stools   Meloxicam Diarrhea and Other (See Comments)    Caused bloody stools   Amoxicillin-Pot Clavulanate    Lipitor [Atorvastatin] Other (See Comments)    myalgia   Nsaids     Other reaction(s): Unknown   Statins Other (See Comments)    myalgia   Amoxicillin Itching and Rash   Penicillins Rash    Antimicrobials this admission:  11/28 Vancomycin >> 11/29 Ceftriaxone >>  Microbiology results:  11/27 body fluid from ankle: moderate Staph aureus, rare Kleb pneumo  1128 Surgical PCR: MRSA negative, Staph aureus positive 11/28 L ankle  effusion (OR culture): moderate Staph aureus, no anaerobes isolated 11/28 fungus culture: in process   Evelyn Campbell, PharmD Clinical Pharmacist  12/2/20249:31 AM

## 2023-10-03 NOTE — TOC Progression Note (Signed)
Transition of Care [facility]) - Progression Note    Patient Details  Name: Evelyn Campbell MRN: [id number] Date of Birth: 04-05-1956  [facility] CM/SW Contact  Amada Jupiter, LCSW Phone Number: 10/03/2023, 1:47 PM  Clinical Narrative:     Have reviewed SNF beds with pt and spouse and they have accepted bed at [facility] who can admit pt tomorrow.  MDs following are aware.  Facility has been provided with abx plan to order supplies.  Continue to follow.  Expected Discharge Plan:  (TBD) Barriers to Discharge: Continued Medical Work up  Expected Discharge Plan and Services In-house Referral: Clinical Social Work     Living arrangements for the past 2 months: Single Family Home                                       Social Determinants of Health (SDOH) Interventions SDOH Screenings   Food Insecurity: No Food Insecurity (09/29/2023)  Housing: Low Risk  (09/29/2023)  Transportation Needs: No Transportation Needs (09/29/2023)  Utilities: Not At Risk (09/29/2023)  Depression (PHQ2-9): Medium Risk (05/22/2020)  Tobacco Use: Low Risk  (09/29/2023)    Readmission Risk Interventions     No data to display

## 2023-10-03 NOTE — Discharge Instructions (Signed)
Toni Arthurs, MD [facility]  Please read the following information regarding your care after surgery.  Medications  You only need a prescription for the narcotic pain medicine (ex. oxycodone, Percocet, Norco).  All of the other medicines listed below are available over the counter. ? Aleve 2 pills twice a day for the first 3 days after surgery. ? acetominophen (Tylenol) 650 mg every 4-6 hours as you need for minor to moderate pain ? oxycodone as prescribed for severe pain  Narcotic pain medicine (ex. oxycodone, Percocet, Vicodin) will cause constipation.  To prevent this problem, take the following medicines while you are taking any pain medicine. ? docusate sodium (Colace) 100 mg twice a day ? senna (Senokot) 2 tablets twice a day  Weight Bearing ? Bear weight only on your operated foot in the CAM boot.   Cast / Splint / Dressing ? Keep your splint, cast or dressing clean and dry.  Don't put anything (coat hanger, pencil, etc) down inside of it.  If it gets damp, use a hair dryer on the cool setting to dry it.  If it gets soaked, call the office to schedule an appointment for a cast change. ? Remove your dressing 3 days after surgery and cover the incisions with dry dressings.    After your dressing, cast or splint is removed; you may shower, but do not soak or scrub the wound.  Allow the water to run over it, and then gently pat it dry.  Swelling It is normal for you to have swelling where you had surgery.  To reduce swelling and pain, keep your toes above your nose for at least 3 days after surgery.  It may be necessary to keep your foot or leg elevated for several weeks.  If it hurts, it should be elevated.  Follow Up Call my office at [phone] when you are discharged from the hospital or surgery center to schedule an appointment to be seen two weeks after surgery.  Call my office at [phone] if you develop a fever >101.5 F, nausea, vomiting, bleeding from the surgical site  or severe pain.

## 2023-10-03 NOTE — Progress Notes (Signed)
PROGRESS NOTE    Evelyn Campbell  UXL:[id number] DOB: 1956-10-13 DOA: 09/28/2023 PCP: Gweneth Dimitri, MD   Brief Narrative: 67 year old with past medical history significant for nonalcoholic fatty liver, CKD, gallbladder disease, GERD, hyperlipidemia, hypertension, lower extremity edema, obesity, history of pneumonia, vitamin D deficiency, peripheral neuropathy, gout, right Charcot foot, chronic edema of the left ankle who presents to the emergency department complaining of 2 days history of progressively worsening edema, erythema and tenderness on her left ankle, unable to ambulate.  Evaluation in the ED arthrocentesis was performed synovial fluid show few gram-positive cocci, white blood cell 3350.  Left ankle x-rays show extensive erosion with obstruction of the distal talus, anterior process calcaneus and tarsal bone of the midfoot.  Orthopedic consulted for septic arthritis of the left ankle patient was taken to the OR for left ankle arthrotomy with irrigation and excisional debridement 11/28.   Assessment & Plan:   Principal Problem:   Septic arthritis of ankle (HCC) Active Problems:   Essential hypertension   Idiopathic peripheral neuropathy   Mixed hyperlipidemia   Prediabetes   Fatty liver   Gastroesophageal reflux disease   Gout   Charcot's joint of left foot   Hyponatremia   Hypokalemia   Transaminitis   Mild protein malnutrition (HCC)   Hyperbilirubinemia   Thrombocytopenia (HCC)  1-Left ankle septic arthritis, klebsiella Pneumoniae And Staph Aureus. MSSA History of Charcot joint of the left foot -Patient presented with worsening pain, edema, redness left ankle. -Arthrocentesis synovial fluid: Showed: rare klebsiella Pneumoniae And Staph Aureus.  -Underwent left ankle arthrotomy with irrigation and excisional debridement by Dr. Victorino Dike 09/29/2023 -Treated initially with IV Vancomycin, Ceftriaxone Ortho following.  Culture form I and D ; Staph Aureus.  ID consulted,  recommend 6 weeks of Ertapenem 1 gr Q 24 hours to cover MSSA and Klebsiella. End date antibiotics 11/08/2022 Needs to follow up at ID office 12/17.  Needs to follow up with Dr Victorino Dike in 2 weeks.   Hyponatremia: -Suspect hypovolemia.  Treated with fluids -Improved   Hypokalemia:  -Replaced  Transaminases, Hyperbilirubinemia; Fatty Liver -In setting infection. Follow trend.  -Liver function test trending down -She is on 2000 mg of slow release niacin  Mild protein malnutrition -Needs protein supplements  Thrombocytopenia -Suspect related to infection. Monitor count.  Resolved.   Hypertension; -Continue with atenolol and will hold Norvasc Norvasc, systolic blood pressure in the 130 range  Idiopathic peripheral neuropathy -Continue gabapentin  Hyperlipidemia -Niaspan  Prediabetes -Carb modified diet -A1c  GERD -Continue with PPI  Gout: -Continue with allopurinol  Right foot; small open wound. Wound care consulted  Estimated body mass index is 36.94 kg/m as calculated from the following:   Height as of this encounter: 5\' 5"  (1.651 m).   Weight as of this encounter: 100.7 kg.   DVT prophylaxis: Lovenox Code Status: Full code Family Communication: Husband at bedside 11/29.  Disposition Plan:  Status is: Inpatient Remains inpatient appropriate because: Management of septic arthritis    Consultants:  Dr Victorino Dike  Procedures:  I and D ankle.   Antimicrobials:    Subjective: She is feeling tired, not feeling well today. Could sleep last night, having pain. BM on Saturday.   Objective: Vitals:   10/02/23 0542 10/02/23 1325 10/02/23 2004 10/03/23 0513  BP: (!) 153/89 (!) 134/92 132/66 (!) 145/75  Pulse: 77 70 77 81  Resp: 17 20 16 18   Temp: 98.6 F (37 C) 98.3 F (36.8 C) 98.2 F (36.8 C) 99.1 F (37.3 C)  TempSrc:   Oral Oral  SpO2: 94% 95% 96% 93%  Weight:      Height:        Intake/Output Summary (Last 24 hours) at 10/03/2023 1312 Last data  filed at 10/03/2023 1000 Gross per 24 hour  Intake 830 ml  Output 2550 ml  Net -1720 ml   Filed Weights   09/28/23 1943 09/29/23 1617  Weight: 100.7 kg 100.7 kg    Examination:  General exam: NAD Respiratory system: CTA Cardiovascular system: S 1, S 2 RRR Gastrointestinal system: BS present, soft, nt Extremities: Left LE with dressing and cam  boot.   Data Reviewed: I have personally reviewed following labs and imaging studies  CBC: Recent Labs  Lab 09/28/23 2216 09/30/23 0348 10/01/23 0404 10/03/23 0355  WBC 17.1* 8.9 8.5 9.1  NEUTROABS 13.9*  --   --   --   HGB 12.4 11.1* 10.3* 10.3*  HCT 37.0 32.9* 31.1* 29.8*  MCV 89.2 89.2 90.1 87.4  PLT 118* 108* 149* 188   Basic Metabolic Panel: Recent Labs  Lab 09/28/23 2216 09/30/23 0348 10/01/23 0404 10/03/23 0355  NA 131* 134* 133* 134*  K 3.2* 3.8 4.2 3.8  CL 96* 104 100 98  CO2 24 22 26 27   GLUCOSE 114* 96 108* 98  BUN 23 18 15 14   CREATININE 0.97 0.70 0.67 0.63  CALCIUM 9.3 8.6* 8.7* 8.7*   GFR: Estimated Creatinine Clearance: 78.1 mL/min (by C-G formula based on SCr of 0.63 mg/dL). Liver Function Tests: Recent Labs  Lab 09/28/23 2216 09/30/23 0348  AST 51* 32  ALT 54* 40  ALKPHOS 105 131*  BILITOT 1.4* 0.8  PROT 6.4* 5.5*  ALBUMIN 3.2* 2.4*   No results for input(s): "LIPASE", "AMYLASE" in the last 168 hours. No results for input(s): "AMMONIA" in the last 168 hours. Coagulation Profile: No results for input(s): "INR", "PROTIME" in the last 168 hours. Cardiac Enzymes: No results for input(s): "CKTOTAL", "CKMB", "CKMBINDEX", "TROPONINI" in the last 168 hours. BNP (last 3 results) No results for input(s): "PROBNP" in the last 8760 hours. HbA1C: No results for input(s): "HGBA1C" in the last 72 hours.  CBG: No results for input(s): "GLUCAP" in the last 168 hours. Lipid Profile: No results for input(s): "CHOL", "HDL", "LDLCALC", "TRIG", "CHOLHDL", "LDLDIRECT" in the last 72 hours. Thyroid  Function Tests: No results for input(s): "TSH", "T4TOTAL", "FREET4", "T3FREE", "THYROIDAB" in the last 72 hours. Anemia Panel: No results for input(s): "VITAMINB12", "FOLATE", "FERRITIN", "TIBC", "IRON", "RETICCTPCT" in the last 72 hours. Sepsis Labs: No results for input(s): "PROCALCITON", "LATICACIDVEN" in the last 168 hours.  Recent Results (from the past 240 hour(s))  Body fluid culture w Gram Stain     Status: None   Collection Time: 09/28/23 10:36 PM   Specimen: Ankle; Body Fluid  Result Value Ref Range Status   Specimen Description   Final    ANKLE Performed at [facility], [address] [place], [place] [address]    Special Requests   Final    NONE Performed at [facility], [address], [place], [place] [address]    Gram Stain   Final    MODERATE WBC PRESENT,BOTH PMN AND MONONUCLEAR FEW GRAM POSITIVE COCCI    Culture   Final    MODERATE STAPHYLOCOCCUS AUREUS RARE KLEBSIELLA PNEUMONIAE Confirmed Extended Spectrum Beta-Lactamase Producer (ESBL).  In bloodstream infections from ESBL organisms, carbapenems are preferred over piperacillin/tazobactam. They are shown to have a lower risk of mortality. CRITICAL  RESULT CALLED TO, READ BACK BY AND VERIFIED WITH: RN [place] W. [id number] FCP Performed at [facility], [address], [place], [place] [address]    Report Status 10/03/2023 FINAL  Final   Organism ID, Bacteria STAPHYLOCOCCUS AUREUS  Final   Organism ID, Bacteria KLEBSIELLA PNEUMONIAE  Final      Susceptibility   Klebsiella pneumoniae - MIC*    AMPICILLIN >=32 RESISTANT Resistant     CEFEPIME >=32 RESISTANT Resistant     CEFTAZIDIME RESISTANT Resistant     CEFTRIAXONE >=64 RESISTANT Resistant     CIPROFLOXACIN 1 RESISTANT Resistant     GENTAMICIN >=16 RESISTANT Resistant     IMIPENEM <=0.25 SENSITIVE Sensitive     TRIMETH/SULFA >=320 RESISTANT Resistant     AMPICILLIN/SULBACTAM >=32 RESISTANT Resistant      PIP/TAZO <=4 SENSITIVE Sensitive ug/mL    * RARE KLEBSIELLA PNEUMONIAE   Staphylococcus aureus - MIC*    CIPROFLOXACIN <=0.5 SENSITIVE Sensitive     ERYTHROMYCIN <=0.25 SENSITIVE Sensitive     GENTAMICIN <=0.5 SENSITIVE Sensitive     OXACILLIN <=0.25 SENSITIVE Sensitive     TETRACYCLINE <=1 SENSITIVE Sensitive     VANCOMYCIN 1 SENSITIVE Sensitive     TRIMETH/SULFA <=10 SENSITIVE Sensitive     CLINDAMYCIN <=0.25 SENSITIVE Sensitive     RIFAMPIN <=0.5 SENSITIVE Sensitive     Inducible Clindamycin NEGATIVE Sensitive     LINEZOLID 1 SENSITIVE Sensitive     * MODERATE STAPHYLOCOCCUS AUREUS  Surgical pcr screen     Status: Abnormal   Collection Time: 09/29/23 11:34 AM   Specimen: Nasal Mucosa; Nasal Swab  Result Value Ref Range Status   MRSA, PCR NEGATIVE NEGATIVE Final   Staphylococcus aureus POSITIVE (A) NEGATIVE Final    Comment: (NOTE) The Xpert SA Assay (FDA approved for NASAL specimens in patients 46 years of age and older), is one component of a comprehensive surveillance program. It is not intended to diagnose infection nor to guide or monitor treatment. Performed at [facility], [address] [place], [place] [address]   Aerobic/Anaerobic Culture w Gram Stain (surgical/deep wound)     Status: None (Preliminary result)   Collection Time: 09/29/23  5:13 PM   Specimen: PATH Cytology Misc. fluid; Body Fluid  Result Value Ref Range Status   Specimen Description   Final    FLUID LEFT ANKLE EFFUSION Performed at [facility], [address], [place], [place] [address]    Special Requests   Final    NONE Performed at [facility], [address], [place], [place] [address]    Gram Stain   Final    MODERATE WBC PRESENT, PREDOMINANTLY PMN RARE GRAM POSITIVE COCCI IN CLUSTERS Performed at [facility], [address] [place], [place] [address]    Culture   Final    MODERATE STAPHYLOCOCCUS AUREUS NO ANAEROBES  ISOLATED; CULTURE IN PROGRESS FOR 5 DAYS    Report Status PENDING  Incomplete         Radiology Studies: No results found.      Scheduled Meds:  allopurinol  300 mg Oral Daily   atenolol  100 mg Oral QHS   Chlorhexidine Gluconate Cloth  6 each Topical Daily   cholecalciferol  2,000 Units Oral Daily   docusate sodium  100 mg Oral BID   enoxaparin (LOVENOX) injection  40 mg Subcutaneous Q24H   feeding supplement (GLUCERNA SHAKE)  237 mL Oral Q24H   gabapentin  300 mg Oral Daily   And   gabapentin  900 mg Oral QHS   mupirocin ointment  1 Application Nasal BID   niacin  2,000 mg Oral QHS   omega-3 acid ethyl esters  2 g Oral BID   pantoprazole  40 mg Oral Daily   polyethylene glycol  17 g Oral Daily   Continuous Infusions:  ertapenem 1 g (10/03/23 1307)     LOS: 4 days    Time spent: 35 minutes.     Alba Cory, MD [facility]   If 7PM-7AM, please contact night-coverage [email or web address]  10/03/2023, 1:12 PM

## 2023-10-03 NOTE — Progress Notes (Signed)
Subjective: 4 Days Post-Op Procedure(s) (LRB): INCISION AND DRAINAGE LEFT ANKLE (Left)  Patient reports pain as mild to moderate.  Tolerating POs well.  Admits to BM.  Denies fever, chills, N/V, CP, SOB.  Notes that she has taken a few steps with therapy.  Objective:   VITALS:  Temp:  [98.2 F (36.8 C)-99.1 F (37.3 C)] 99.1 F (37.3 C) (12/02 0513) Pulse Rate:  [70-81] 81 (12/02 0513) Resp:  [16-20] 18 (12/02 0513) BP: (132-145)/(66-92) 145/75 (12/02 0513) SpO2:  [93 %-96 %] 93 % (12/02 0513)  General: WDWN patient in NAD. Psych:  Appropriate mood and affect. Neuro:  A&O x 3, Moving all extremities, sensation intact to light touch HEENT:  EOMs intact Chest:  Even non-labored respirations Skin:  Dressing C/D/I, no rashes or lesions Extremities: warm/dry, moderate edema to L ankle/forefoot, no visible erythema or echymosis.  No lymphadenopathy. Pulses: Popliteus 2+ MSK:  ROM: EHL/FHL intact, MMT: able to perform quad set  LABS Recent Labs    10/01/23 0404 10/03/23 0355  HGB 10.3* 10.3*  WBC 8.5 9.1  PLT 149* 188   Recent Labs    10/01/23 0404 10/03/23 0355  NA 133* 134*  K 4.2 3.8  CL 100 98  CO2 26 27  BUN 15 14  CREATININE 0.67 0.63  GLUCOSE 108* 98   No results for input(s): "LABPT", "INR" in the last 72 hours.   Assessment/Plan: 4 Days Post-Op Procedure(s) (LRB): INCISION AND DRAINAGE LEFT ANKLE (Left)  WBAT L LE in CAM boot Up with therapy ABX per ID Disp:  SNF D/C script for oxycodone placed on chart after searching [place] PMP Aware. Plan for 2 week outpatient post-op visit. Patient stable from ortho perspective.  Signing off.  Please contact with concerns.  Alfredo Martinez PA-C [facility] Office:  [phone]

## 2023-10-03 NOTE — Progress Notes (Signed)
Physical Therapy Treatment Patient Details Name: Evelyn Campbell MRN: [id number] DOB: 08/09/1956 Today's Date: 10/03/2023   History of Present Illness 67 y.o. female who presented to the emergency department with  progressively worse edema, erythema and tenderness of her left ankle, inability to walk. adm with septic arthritis. s/p Left ankle arthrotomy with irrigation and excisional debridement 09/29/23. PMH: nonalcoholic fatty liver, CKD, gallbladder disease, GERD, hyperlipidemia, hypertension, lower extremity edema, obesity,  pneumonia, vitamin D deficiency, peripheral neuropathy, gout, right Charcot's foot, chronic edema left ankle.    PT Comments  Pt progressing this session, incr gait distance. Pt able to tolerate incr wt shift to LLE, continues to require assist for balance and maneuver RW. Limited by pain and fatigue although good pt effort. D/c plan remains appropriate at this time    If plan is discharge home, recommend the following: A lot of help with walking and/or transfers;A lot of help with bathing/dressing/bathroom;Assistance with cooking/housework;Assist for transportation;Help with stairs or ramp for entrance   Can travel by private vehicle     No  Equipment Recommendations  None recommended by PT    Recommendations for Other Services       Precautions / Restrictions Precautions Precautions: Fall Precaution Comments: denies h/o falls in past 6 months Required Braces or Orthoses: Other Brace Other Brace: cam boot/post op boot L ankle Restrictions Weight Bearing Restrictions: No LLE Weight Bearing: Weight bearing as tolerated     Mobility  Bed Mobility Overal bed mobility: Needs Assistance Bed Mobility: Supine to Sit     Supine to sit: Min assist     General bed mobility comments: increased time, assist to progress LLE off bed    Transfers Overall transfer level: Needs assistance Equipment used: Rolling walker (2 wheels) Transfers: Sit to/from  Stand Sit to Stand: Min assist, +2 safety/equipment, From elevated surface           General transfer comment: cues for hand placement, assist to power up to stand    Ambulation/Gait Ambulation/Gait assistance: Min assist, +2 physical assistance, +2 safety/equipment Gait Distance (Feet): 8 Feet Assistive device: Rolling walker (2 wheels) Gait Pattern/deviations: Step-to pattern Gait velocity: decr     General Gait Details: cues for sequence, assist to move RW fwd, balance and steady during wt shift   Stairs             Wheelchair Mobility     Tilt Bed    Modified Rankin (Stroke Patients Only)       Balance   Sitting-balance support: Feet supported, No upper extremity supported Sitting balance-Leahy Scale: Good     Standing balance support: Bilateral upper extremity supported, During functional activity, Reliant on assistive device for balance Standing balance-Leahy Scale: Poor                              Cognition Arousal: Alert Behavior During Therapy: WFL for tasks assessed/performed Overall Cognitive Status: Within Functional Limits for tasks assessed                                          Exercises      General Comments General comments (skin integrity, edema, etc.): max assist to don shoe R foot to allow more equal leg lengths d/t boot on L      Pertinent Vitals/Pain Pain Assessment Pain Assessment: Faces Faces  Pain Scale: Hurts little more Pain Location: L ankle Pain Descriptors / Indicators: Sore Pain Intervention(s): Limited activity within patient's tolerance, Monitored during session    Home Living                          Prior Function            PT Goals (current goals can now be found in the care plan section) Acute Rehab PT Goals Patient Stated Goal: to walk and go home PT Goal Formulation: With patient Time For Goal Achievement: 10/14/23 Potential to Achieve Goals:  Good Progress towards PT goals: Progressing toward goals    Frequency    Min 1X/week      PT Plan      Co-evaluation              AM-PAC PT "6 Clicks" Mobility   Outcome Measure  Help needed turning from your back to your side while in a flat bed without using bedrails?: A Little Help needed moving from lying on your back to sitting on the side of a flat bed without using bedrails?: A Little Help needed moving to and from a bed to a chair (including a wheelchair)?: A Little Help needed standing up from a chair using your arms (e.g., wheelchair or bedside chair)?: A Little Help needed to walk in hospital room?: A Lot Help needed climbing 3-5 steps with a railing? : Total 6 Click Score: 15    End of Session Equipment Utilized During Treatment: Gait belt Activity Tolerance: Patient tolerated treatment well;Patient limited by fatigue Patient left: in chair;with call bell/phone within reach;with chair alarm set;with family/visitor present   PT Visit Diagnosis: Unsteadiness on feet (R26.81);Muscle weakness (generalized) (M62.81);Difficulty in walking, not elsewhere classified (R26.2)     Time: 4696-2952 PT Time Calculation (min) (ACUTE ONLY): 24 min  Charges:    $Gait Training: 8-22 mins $Therapeutic Activity: 8-22 mins PT General Charges $$ ACUTE PT VISIT: 1 Visit                     Betrice Wanat, PT  Acute Rehab Dept [facility]) [phone]  10/03/2023    [facility] 10/03/2023, 11:00 AM

## 2023-10-03 NOTE — Progress Notes (Signed)
[facility]  Date of Admission:  09/28/2023   Total days of inpatient antibiotics 3  Principal Problem:   Septic arthritis of ankle (HCC) Active Problems:   Essential hypertension   Idiopathic peripheral neuropathy   Mixed hyperlipidemia   Prediabetes   Fatty liver   Gastroesophageal reflux disease   Gout   Charcot's joint of left foot   Hyponatremia   Hypokalemia   Transaminitis   Mild protein malnutrition (HCC)   Hyperbilirubinemia   Thrombocytopenia (HCC)          Assessment: 67 year old female with history of stroke on the right foot, left ankle swelling for about a year per patient, gout, nonalcoholic fatty liver, CKD admitted with: #Left ankle septic arthritis, concern for osteomyelitis status post I&D on 11/28 - Patient had presented with a 2-day history of left ankle pain/swelling/erythema.  Patient states her left ankle has been swelling on and off for about a year.  She also notes she has right foot wound that has been managed by wound care. - Underwent arthrocentesis of left ankle in the ED with cultures growing Klebsiella pneumonia.and staph aureus - Taken to the OR with Dr. Victorino Dike for I&D of left ankle for septic arthritis, cultures sent   Recommendations:  -Continue vancomycin and ceftriaxone - Follow OR cultures(staph aureus) - Place Blake anticipate 4 weeks of antibiotics   Microbiology:   Antibiotics: Vancomycin 11/27-     Cultures: Blood   Urine   Other 11/27 Klebsiella pneumonia 11/28 OR cultures pending   SUBJECTIVE: Resitng in bed Interval: afebrile overnight  Review of Systems: Review of Systems  All other systems reviewed and are negative.    Scheduled Meds:  allopurinol  300 mg Oral Daily   atenolol  100 mg Oral QHS   Chlorhexidine Gluconate Cloth  6 each Topical Daily   cholecalciferol  2,000 Units Oral Daily   docusate sodium  100 mg Oral BID   enoxaparin (LOVENOX) injection  40 mg  Subcutaneous Q24H   feeding supplement (GLUCERNA SHAKE)  237 mL Oral Q24H   gabapentin  300 mg Oral Daily   And   gabapentin  900 mg Oral QHS   mupirocin ointment  1 Application Nasal BID   niacin  2,000 mg Oral QHS   omega-3 acid ethyl esters  2 g Oral BID   pantoprazole  40 mg Oral Daily   polyethylene glycol  17 g Oral Daily   Continuous Infusions:  cefTRIAXone (ROCEPHIN)  IV 2 g (10/02/23 1306)   vancomycin 1,250 mg (10/02/23 1358)   PRN Meds:.acetaminophen **OR** acetaminophen, metoCLOPramide **OR** metoCLOPramide (REGLAN) injection, morphine injection, ondansetron **OR** ondansetron (ZOFRAN) IV, oxyCODONE, sodium chloride flush Allergies  Allergen Reactions   Celebrex [Celecoxib] Diarrhea and Other (See Comments)    Cause bloody stools   Ibuprofen Diarrhea and Other (See Comments)    Caused bloody stools   Meloxicam Diarrhea and Other (See Comments)    Caused bloody stools   Amoxicillin-Pot Clavulanate    Lipitor [Atorvastatin] Other (See Comments)    myalgia   Nsaids     Other reaction(s): Unknown   Statins Other (See Comments)    myalgia   Amoxicillin Itching and Rash   Penicillins Rash    OBJECTIVE: Vitals:   10/02/23 0542 10/02/23 1325 10/02/23 2004 10/03/23 0513  BP: (!) 153/89 (!) 134/92 132/66 (!) 145/75  Pulse: 77 70 77 81  Resp: 17 20 16 18   Temp: 98.6  F (37 C) 98.3 F (36.8 C) 98.2 F (36.8 C) 99.1 F (37.3 C)  TempSrc:   Oral Oral  SpO2: 94% 95% 96% 93%  Weight:      Height:       Body mass index is 36.94 kg/m.  Physical Exam Constitutional:      Appearance: Normal appearance.  HENT:     Head: Normocephalic and atraumatic.     Right Ear: Tympanic membrane normal.     Left Ear: Tympanic membrane normal.     Nose: Nose normal.     Mouth/Throat:     Mouth: Mucous membranes are moist.  Eyes:     Extraocular Movements: Extraocular movements intact.     Conjunctiva/sclera: Conjunctivae normal.     Pupils: Pupils are equal, round, and  reactive to light.  Cardiovascular:     Rate and Rhythm: Normal rate and regular rhythm.     Heart sounds: No murmur heard.    No friction rub. No gallop.  Pulmonary:     Effort: Pulmonary effort is normal.     Breath sounds: Normal breath sounds.  Abdominal:     General: Abdomen is flat.     Palpations: Abdomen is soft.  Musculoskeletal:     Comments: Ankle wound  Skin:    General: Skin is warm and dry.  Neurological:     General: No focal deficit present.     Mental Status: She is alert and oriented to person, place, and time.  Psychiatric:        Mood and Affect: Mood normal.       Lab Results Lab Results  Component Value Date   WBC 9.1 10/03/2023   HGB 10.3 (L) 10/03/2023   HCT 29.8 (L) 10/03/2023   MCV 87.4 10/03/2023   PLT 188 10/03/2023    Lab Results  Component Value Date   CREATININE 0.63 10/03/2023   BUN 14 10/03/2023   NA 134 (L) 10/03/2023   K 3.8 10/03/2023   CL 98 10/03/2023   CO2 27 10/03/2023    Lab Results  Component Value Date   ALT 40 09/30/2023   AST 32 09/30/2023   ALKPHOS 131 (H) 09/30/2023   BILITOT 0.8 09/30/2023        Danelle Earthly, MD [facility] 10/03/2023, 7:06 AM I have personally spent 52 minutes involved in face-to-face and non-face-to-face activities for this patient on the day of the visit. Professional time spent includes the following activities: Preparing to see the patient (review of tests), Obtaining and/or reviewing separately obtained history (admission/discharge record), Performing a medically appropriate examination and/or evaluation , Ordering medications/tests/procedures, referring and communicating with other health care professionals, Documenting clinical information in the EMR, Independently interpreting results (not separately reported), Communicating results to the patient/family/caregiver, Counseling and educating the patient/family/caregiver and Care  coordination (not separately reported).

## 2023-10-04 DIAGNOSIS — M009 Pyogenic arthritis, unspecified: Secondary | ICD-10-CM | POA: Diagnosis not present

## 2023-10-04 LAB — AEROBIC/ANAEROBIC CULTURE W GRAM STAIN (SURGICAL/DEEP WOUND): Culture: NO GROWTH

## 2023-10-04 LAB — SURGICAL PATHOLOGY

## 2023-10-04 MED ORDER — POLYETHYLENE GLYCOL 3350 17 G PO PACK: 17.0000 g | PACK | Freq: Every day | ORAL | 0 refills | Status: AC

## 2023-10-04 MED ORDER — ERTAPENEM IV (FOR PTA / DISCHARGE USE ONLY): 1.0000 g | INTRAVENOUS | 0 refills | Status: AC

## 2023-10-04 MED ORDER — HEPARIN SOD (PORK) LOCK FLUSH 100 UNIT/ML IV SOLN
250.0000 [IU] | INTRAVENOUS | Status: AC | PRN
  Administered 2023-10-04: 250 [IU]
  Filled 2023-10-04: qty 2.5

## 2023-10-04 NOTE — TOC Transition Note (Signed)
Transition of Care [facility]) - CM/SW Discharge Note   Patient Details  Name: Evelyn Campbell MRN: [id number] Date of Birth: 09-11-1956  Transition of Care [facility]) CM/SW Contact:  Amada Jupiter, LCSW Phone Number: 10/04/2023, 1:46 PM   Clinical Narrative:     Pt medically cleared for dc today.  Received another SNF bed offer this morning from [facility] and pt/ spouse have decided to accept this bed offer instead of [facility].  [facility] able to admit today.  PTAR called at 1:45pm.  RN to call report to [phone].  No further TOC needs.  Final next level of care: Skilled Nursing Facility Barriers to Discharge: Barriers Resolved   Patient Goals and CMS Choice      Discharge Placement PASRR number recieved: 10/02/23 PASRR number recieved: 10/02/23            Patient chooses bed at: [facility] at [facility] Patient to be transferred to facility by: PTAR Name of family member notified: spouse Patient and family notified of of transfer: 10/04/23  Discharge Plan and Services Additional resources added to the After Visit Summary for   In-house Referral: Clinical Social Work              DME Arranged: N/A DME Agency: NA                  Social Determinants of Health (SDOH) Interventions SDOH Screenings   Food Insecurity: No Food Insecurity (09/29/2023)  Housing: Low Risk  (09/29/2023)  Transportation Needs: No Transportation Needs (09/29/2023)  Utilities: Not At Risk (09/29/2023)  Depression (PHQ2-9): Medium Risk (05/22/2020)  Tobacco Use: Low Risk  (09/29/2023)     Readmission Risk Interventions    10/04/2023    1:44 PM  Readmission Risk Prevention Plan  Transportation Screening Complete  PCP or Specialist Appt within 5-7 Days Complete  Home Care Screening Complete  Medication Review (RN CM) Complete

## 2023-10-04 NOTE — Plan of Care (Signed)
  Problem: Coping: Goal: Level of anxiety will decrease Outcome: Progressing   Problem: Pain Management: Goal: General experience of comfort will improve Outcome: Progressing   Problem: Safety: Goal: Ability to remain free from injury will improve Outcome: Progressing

## 2023-10-04 NOTE — Discharge Summary (Signed)
Physician Discharge Summary   Patient: Evelyn Campbell MRN: [id number] DOB: April 14, 1956  Admit date:     09/28/2023  Discharge date: 10/04/23  Discharge Physician: Alba Cory   PCP: Gweneth Dimitri, MD   Recommendations at discharge:   Needs to follow up with Dr Victorino Dike 2 weeks from Sx.  Needs 6 weeks IV antibiotics.  Needs to follow up with ID after completion of IV antibiotics. End date antibiotics 11/08/2022 Needs to follow up at ID office 12/17.   Discharge Diagnoses: Principal Problem:   Septic arthritis of ankle (HCC) Active Problems:   Essential hypertension   Idiopathic peripheral neuropathy   Mixed hyperlipidemia   Prediabetes   Fatty liver   Gastroesophageal reflux disease   Gout   Charcot's joint of left foot   Hyponatremia   Hypokalemia   Transaminitis   Mild protein malnutrition (HCC)   Hyperbilirubinemia   Thrombocytopenia (HCC)  Resolved Problems:   * No resolved hospital problems. *  Hospital Course: 67 year old with past medical history significant for nonalcoholic fatty liver, CKD, gallbladder disease, GERD, hyperlipidemia, hypertension, lower extremity edema, obesity, history of pneumonia, vitamin D deficiency, peripheral neuropathy, gout, right Charcot foot, chronic edema of the left ankle who presents to the emergency department complaining of 2 days history of progressively worsening edema, erythema and tenderness on her left ankle, unable to ambulate.   Evaluation in the ED arthrocentesis was performed synovial fluid show few gram-positive cocci, white blood cell 3350.  Left ankle x-rays show extensive erosion with obstruction of the distal talus, anterior process calcaneus and tarsal bone of the midfoot.  Orthopedic consulted for septic arthritis of the left ankle patient was taken to the OR for left ankle arthrotomy with irrigation and excisional debridement 11/28.      Assessment and Plan: 1-Left ankle septic arthritis, klebsiella Pneumoniae  And Staph Aureus. MSSA History of Charcot joint of the left foot -Patient presented with worsening pain, edema, redness left ankle. -Arthrocentesis synovial fluid: Showed: rare klebsiella Pneumoniae And Staph Aureus.  -Underwent left ankle arthrotomy with irrigation and excisional debridement by Dr. Victorino Dike 09/29/2023 -Treated initially with IV Vancomycin, Ceftriaxone Ortho following.  Culture form I and D ; Staph Aureus.  ID consulted, recommend 6 weeks of Ertapenem 1 gr Q 24 hours to cover MSSA and Klebsiella. End date antibiotics 11/08/2022 Needs to follow up at ID office 12/17.  Needs to follow up with Dr Victorino Dike in 2 weeks. Post Sx WBAT L LE in CAM boot   Hyponatremia: -Suspect hypovolemia.  Treated with fluids -Improved    Hypokalemia:  -Replaced   Transaminases, Hyperbilirubinemia; Fatty Liver -In setting infection. Follow trend.  -Liver function test trending down -She is on 2000 mg of slow release niacin   Mild protein malnutrition -Needs protein supplements   Thrombocytopenia -Suspect related to infection. Monitor count.  Resolved.    Hypertension; -Continue with atenolol and will hold Norvasc Norvasc, systolic blood pressure in the 130 range   Idiopathic peripheral neuropathy -Continue gabapentin   Hyperlipidemia -Niaspan   Prediabetes -Carb modified diet -A1c   GERD -Continue with PPI   Gout: -Continue with allopurinol   Right foot; small open wound. Wound care consulted   Estimated body mass index is 36.94 kg/m as calculated from the following:   Height as of this encounter: 5\' 5"  (1.651 m).   Weight as of this encounter: 100.7 kg.          Consultants: Dr Victorino Dike, ID Procedures performed: I and D Disposition: Home  Diet recommendation:  Cardiac diet DISCHARGE MEDICATION: Allergies as of 10/04/2023       Reactions   Celebrex [celecoxib] Diarrhea, Other (See Comments)   Cause bloody stools   Ibuprofen Diarrhea, Other (See Comments)    Caused bloody stools   Meloxicam Diarrhea, Other (See Comments)   Caused bloody stools   Amoxicillin-pot Clavulanate    Lipitor [atorvastatin] Other (See Comments)   myalgia   Nsaids    Other reaction(s): Unknown   Statins Other (See Comments)   myalgia   Amoxicillin Itching, Rash   Penicillins Rash        Medication List     STOP taking these medications    amLODipine 5 MG tablet Commonly known as: NORVASC   doxycycline 100 MG capsule Commonly known as: VIBRAMYCIN       TAKE these medications    allopurinol 300 MG tablet Commonly known as: ZYLOPRIM Take 300 mg by mouth daily.   atenolol 100 MG tablet Commonly known as: TENORMIN Take 100 mg by mouth at bedtime.   colchicine 0.6 MG tablet 0.12 mg as needed.   docusate sodium 100 MG capsule Commonly known as: COLACE Take 200 mg by mouth daily.   ertapenem IVPB Commonly known as: INVANZ Inject 1 g into the vein daily. Indication:  Septic arthritis of ankle  First Dose: Yes Last Day of Therapy:  11/09/23  Labs - Once weekly:  CBC/D and BMP, Labs - Once weekly: ESR and CRP Method of administration: Mini-Bag Plus / Gravity Method of administration may be changed at the discretion of home infusion pharmacist based upon assessment of the patient and/or caregiver's ability to self-administer the medication ordered.   gabapentin 300 MG capsule Commonly known as: NEURONTIN Take 300-900 mg by mouth See admin instructions.   multivitamin with minerals Tabs tablet Take 1 tablet by mouth daily. Centrum Heart   niacin 1000 MG CR tablet Commonly known as: NIASPAN Take 2 tablets (2,000 mg total) by mouth at bedtime.   omega-3 acid ethyl esters 1 g capsule Commonly known as: LOVAZA Take 2 g by mouth 2 (two) times daily.   omeprazole 20 MG capsule Commonly known as: PRILOSEC Take 20 mg by mouth daily.   oxyCODONE 5 MG immediate release tablet Commonly known as: Roxicodone Take 1 tablet (5 mg total) by mouth  every 6 (six) hours as needed for up to 5 days for severe pain (pain score 7-10).   Ozempic (2 MG/DOSE) 8 MG/3ML Sopn Generic drug: Semaglutide (2 MG/DOSE) Inject into the skin. What changed: Another medication with the same name was removed. Continue taking this medication, and follow the directions you see here.   polyethylene glycol 17 g packet Commonly known as: MIRALAX / GLYCOLAX Take 17 g by mouth daily.   senna 8.6 MG Tabs tablet Commonly known as: SENOKOT Take 2 tablets (17.2 mg total) by mouth 2 (two) times daily.   TYLENOL 500 MG tablet Generic drug: acetaminophen Take 500 mg by mouth as needed for mild pain.   Vitamin D 50 MCG (2000 UT) tablet Take 2,000 Units by mouth daily.               Discharge Care Instructions  (From admission, onward)           Start     Ordered   10/04/23 0000  Change dressing on IV access line weekly and PRN  (Home infusion instructions - [facility] )        10/04/23 1031  Contact information for follow-up providers     Toni Arthurs, MD. Schedule an appointment as soon as possible for a visit in 2 week(s).   Specialty: Orthopedic Surgery Contact information: [address] [place] [address] [place] [address] [phone]              Contact information for after-discharge care     Destination     [facility] Preferred SNF .   Service: Skilled Nursing Contact information: [address] [place] [address] [phone]                    Discharge Exam: Ceasar Mons Weights   09/28/23 1943 09/29/23 1617  Weight: 100.7 kg 100.7 kg   General NAD Extremity left ankle with dressing.  Condition at discharge: stable  The results of significant diagnostics from this hospitalization (including imaging, microbiology, ancillary and laboratory) are listed below for reference.   Imaging Studies: [name] EKG SITE RITE  Result Date: 10/01/2023 If Site Rite  image not attached, placement could not be confirmed due to current cardiac rhythm.  DG Ankle Complete Left  Result Date: 09/29/2023 CLINICAL DATA:  Left ankle injury, swelling EXAM: LEFT ANKLE COMPLETE - 3+ VIEW COMPARISON:  None Available. FINDINGS: There is destruction of the talar neck and anterior process of the calcaneus as well as the tarsal bones mid with extensive erosion is identified. While initially changes noted on prior examination may have related to Charcot joint, extensive erosions favor inflammatory process such as osteomyelitis. Extensive surrounding soft tissue swelling is present. Erosions now present distal tibia, distal fibula, and talar dome. IMPRESSION: 1. Extensive erosions with destruction of the distal talus, anterior process calcaneus, and the tarsal bones of the midfoot. Erosions now present distal tibia, distal fibula, and talar dome. An inflammatory process such as osteomyelitis is favored and clinical correlation is suggested. Electronically Signed   By: Helyn Numbers M.D.   On: 09/29/2023 00:58    Microbiology: Results for orders placed or performed during the hospital encounter of 09/28/23  Body fluid culture w Gram Stain     Status: None   Collection Time: 09/28/23 10:36 PM   Specimen: Ankle; Body Fluid  Result Value Ref Range Status   Specimen Description   Final    ANKLE Performed at [facility], [address], [place], [place] [address]    Special Requests   Final    NONE Performed at [facility], [address] [place], [place] [address]    Gram Stain   Final    MODERATE WBC PRESENT,BOTH PMN AND MONONUCLEAR FEW GRAM POSITIVE COCCI    Culture   Final    MODERATE STAPHYLOCOCCUS AUREUS RARE KLEBSIELLA PNEUMONIAE Confirmed Extended Spectrum Beta-Lactamase Producer (ESBL).  In bloodstream infections from ESBL organisms, carbapenems are preferred over piperacillin/tazobactam. They are shown to have a lower risk  of mortality. CRITICAL RESULT CALLED TO, READ BACK BY AND VERIFIED WITH: RN [place] W. [id number] FCP Performed at [facility], [address], [place], [place] [address]    Report Status 10/03/2023 FINAL  Final   Organism ID, Bacteria STAPHYLOCOCCUS AUREUS  Final   Organism ID, Bacteria KLEBSIELLA PNEUMONIAE  Final      Susceptibility   Klebsiella pneumoniae - MIC*    AMPICILLIN >=32 RESISTANT Resistant     CEFEPIME >=32 RESISTANT Resistant     CEFTAZIDIME RESISTANT Resistant     CEFTRIAXONE >=64 RESISTANT Resistant  CIPROFLOXACIN 1 RESISTANT Resistant     GENTAMICIN >=16 RESISTANT Resistant     IMIPENEM <=0.25 SENSITIVE Sensitive     TRIMETH/SULFA >=320 RESISTANT Resistant     AMPICILLIN/SULBACTAM >=32 RESISTANT Resistant     PIP/TAZO <=4 SENSITIVE Sensitive ug/mL    * RARE KLEBSIELLA PNEUMONIAE   Staphylococcus aureus - MIC*    CIPROFLOXACIN <=0.5 SENSITIVE Sensitive     ERYTHROMYCIN <=0.25 SENSITIVE Sensitive     GENTAMICIN <=0.5 SENSITIVE Sensitive     OXACILLIN <=0.25 SENSITIVE Sensitive     TETRACYCLINE <=1 SENSITIVE Sensitive     VANCOMYCIN 1 SENSITIVE Sensitive     TRIMETH/SULFA <=10 SENSITIVE Sensitive     CLINDAMYCIN <=0.25 SENSITIVE Sensitive     RIFAMPIN <=0.5 SENSITIVE Sensitive     Inducible Clindamycin NEGATIVE Sensitive     LINEZOLID 1 SENSITIVE Sensitive     * MODERATE STAPHYLOCOCCUS AUREUS  Surgical pcr screen     Status: Abnormal   Collection Time: 09/29/23 11:34 AM   Specimen: Nasal Mucosa; Nasal Swab  Result Value Ref Range Status   MRSA, PCR NEGATIVE NEGATIVE Final   Staphylococcus aureus POSITIVE (A) NEGATIVE Final    Comment: (NOTE) The Xpert SA Assay (FDA approved for NASAL specimens in patients 40 years of age and older), is one component of a comprehensive surveillance program. It is not intended to diagnose infection nor to guide or monitor treatment. Performed at [facility], [address] [place], [place]  [address]   Fungus Culture With Stain     Status: None (Preliminary result)   Collection Time: 09/29/23  5:13 PM   Specimen: PATH Cytology Misc. fluid; Body Fluid  Result Value Ref Range Status   Fungus Stain Final report  Final    Comment: (NOTE) Performed At: [facility] [address] [place], [place] [id number] Jolene Schimke MD AO:[id number]    Fungus (Mycology) Culture PENDING  Incomplete   Fungal Source BODY FLUID  Final    Comment: LEFT ANKLE EFFUSION Performed at [facility], [address] [place], [place] [address]   Aerobic/Anaerobic Culture w Gram Stain (surgical/deep wound)     Status: None (Preliminary result)   Collection Time: 09/29/23  5:13 PM   Specimen: PATH Cytology Misc. fluid; Body Fluid  Result Value Ref Range Status   Specimen Description FLUID LEFT ANKLE EFFUSION  Final   Special Requests NONE  Final   Gram Stain   Final    MODERATE WBC PRESENT, PREDOMINANTLY PMN RARE GRAM POSITIVE COCCI IN CLUSTERS    Culture   Final    MODERATE STAPHYLOCOCCUS AUREUS NO ANAEROBES ISOLATED; CULTURE IN PROGRESS FOR 5 DAYS    Report Status PENDING  Incomplete   Organism ID, Bacteria STAPHYLOCOCCUS AUREUS  Final      Susceptibility   Staphylococcus aureus - MIC*    CIPROFLOXACIN <=0.5 SENSITIVE Sensitive     ERYTHROMYCIN <=0.25 SENSITIVE Sensitive     GENTAMICIN <=0.5 SENSITIVE Sensitive     OXACILLIN <=0.25 SENSITIVE Sensitive     TETRACYCLINE <=1 SENSITIVE Sensitive     VANCOMYCIN <=0.5 SENSITIVE Sensitive     TRIMETH/SULFA <=10 SENSITIVE Sensitive     CLINDAMYCIN <=0.25 SENSITIVE Sensitive     RIFAMPIN <=0.5 SENSITIVE Sensitive     Inducible Clindamycin NEGATIVE Sensitive     LINEZOLID Value in next row Sensitive      2 SENSITIVEPerformed at [facility], [address], [place], [place] [address]    * MODERATE STAPHYLOCOCCUS  AUREUS  Fungus Culture Result     Status: None   Collection Time: 09/29/23  5:13 PM  Result Value Ref  Range Status   Result 1 Comment  Final    Comment: (NOTE) KOH/Calcofluor preparation:  no fungus observed. Performed At: [facility] [address] [place], [place] [id number] Jolene Schimke MD WU:[id number]     Labs: CBC: Recent Labs  Lab 09/28/23 2216 09/30/23 0348 10/01/23 0404 10/03/23 0355  WBC 17.1* 8.9 8.5 9.1  NEUTROABS 13.9*  --   --   --   HGB 12.4 11.1* 10.3* 10.3*  HCT 37.0 32.9* 31.1* 29.8*  MCV 89.2 89.2 90.1 87.4  PLT 118* 108* 149* 188   Basic Metabolic Panel: Recent Labs  Lab 09/28/23 2216 09/30/23 0348 10/01/23 0404 10/03/23 0355  NA 131* 134* 133* 134*  K 3.2* 3.8 4.2 3.8  CL 96* 104 100 98  CO2 24 22 26 27   GLUCOSE 114* 96 108* 98  BUN 23 18 15 14   CREATININE 0.97 0.70 0.67 0.63  CALCIUM 9.3 8.6* 8.7* 8.7*   Liver Function Tests: Recent Labs  Lab 09/28/23 2216 09/30/23 0348  AST 51* 32  ALT 54* 40  ALKPHOS 105 131*  BILITOT 1.4* 0.8  PROT 6.4* 5.5*  ALBUMIN 3.2* 2.4*   CBG: No results for input(s): "GLUCAP" in the last 168 hours.  Discharge time spent: greater than 30 minutes.  Signed: Alba Cory, MD [facility] 10/04/2023

## 2023-10-04 NOTE — Progress Notes (Signed)
Left message at [facility] for report D Susann Givens RN

## 2023-10-18 ENCOUNTER — Other Ambulatory Visit: Payer: Self-pay

## 2023-10-18 ENCOUNTER — Telehealth: Payer: Self-pay

## 2023-10-18 ENCOUNTER — Ambulatory Visit (INDEPENDENT_AMBULATORY_CARE_PROVIDER_SITE_OTHER): Payer: Medicare Other | Admitting: Internal Medicine

## 2023-10-18 ENCOUNTER — Encounter: Payer: Self-pay | Admitting: Internal Medicine

## 2023-10-18 VITALS — BP 118/76 | HR 67 | Temp 97.8°F

## 2023-10-18 DIAGNOSIS — M869 Osteomyelitis, unspecified: Secondary | ICD-10-CM

## 2023-10-18 NOTE — Progress Notes (Unsigned)
Patient Active Problem List   Diagnosis Date Noted   Septic arthritis of ankle (HCC) 09/29/2023   Hyponatremia 09/29/2023   Hypokalemia 09/29/2023   Transaminitis 09/29/2023   Mild protein malnutrition (HCC) 09/29/2023   Hyperbilirubinemia 09/29/2023   Thrombocytopenia (HCC) 09/29/2023   Abnormal liver function tests 05/25/2022   Abnormal weight gain 05/25/2022   Arthritis 05/25/2022   Body mass index (BMI) 38.0-38.9, adult 05/25/2022   Change in bowel habit 05/25/2022   Colon cancer screening 05/25/2022   Difficulty walking 05/25/2022   Drug-induced myopathy 05/25/2022   Fatty liver 05/25/2022   Flatulence, eructation and gas pain 05/25/2022   Gastroesophageal reflux disease 05/25/2022   Gout 05/25/2022   Hardening of the aorta (main artery of the heart) (HCC) 05/25/2022   Lymphedema 05/25/2022   Neuropathy 05/25/2022   Other long term (current) drug therapy 05/25/2022   Other specified disorders of kidney and ureter 05/25/2022   Periumbilical pain 05/25/2022   Rectal bleeding 05/25/2022   Ulcer of right foot with fat layer exposed (HCC) 05/25/2022   Lymphedema of left leg 04/21/2021   Idiopathic chronic gout of multiple sites without tophus 03/10/2021   Diverticular disease of colon 01/08/2021   Idiopathic peripheral neuropathy 01/08/2021   Mixed hyperlipidemia 01/08/2021   Peripheral venous insufficiency 01/08/2021   History of colonic polyps 01/08/2021   Vitamin D deficiency 01/08/2021   Metabolic syndrome 01/08/2021   Prediabetes 01/08/2021   Visceral obesity 12/25/2020   Screening for viral disease 05/18/2019   Other ganglion and cyst of synovium, tendon, and bursa 05/03/2019   Foot mass, right 05/01/2019   Degenerative arthritis of right ankle 10/05/2016   Incisional hernia 07/02/2015   Left adrenal mass (HCC) 08/20/2014   Essential hypertension 06/14/2014   Family history of ischemic heart disease 06/14/2014   Class 3 severe obesity with serious  comorbidity and body mass index (BMI) of 40.0 to 44.9 in adult (HCC) 06/14/2014   Status post ankle arthrodesis 05/07/2014   Posterior equinus, acquired 02/05/2014   Charcot's joint of left foot 02/05/2014   Pes planovalgus, acquired 05/10/2013   Posterior tibial tendon dysfunction 05/10/2013   Primary osteoarthritis, unspecified ankle and foot 05/10/2013    Patient's Medications  New Prescriptions   No medications on file  Previous Medications   ACETAMINOPHEN (TYLENOL) 500 MG TABLET    Take 500 mg by mouth as needed for mild pain.   ALLOPURINOL (ZYLOPRIM) 300 MG TABLET    Take 300 mg by mouth daily.   ATENOLOL (TENORMIN) 100 MG TABLET    Take 100 mg by mouth at bedtime.   CHOLECALCIFEROL (VITAMIN D) 2000 UNITS TABLET    Take 2,000 Units by mouth daily.   COLCHICINE 0.6 MG TABLET    0.12 mg as needed.   DOCUSATE SODIUM (COLACE) 100 MG CAPSULE    Take 200 mg by mouth daily.    ERTAPENEM (INVANZ) IVPB    Inject 1 g into the vein daily. Indication:  Septic arthritis of ankle  First Dose: Yes Last Day of Therapy:  11/09/23  Labs - Once weekly:  CBC/D and BMP, Labs - Once weekly: ESR and CRP Method of administration: Mini-Bag Plus / Gravity Method of administration may be changed at the discretion of home infusion pharmacist based upon assessment of the patient and/or caregiver's ability to self-administer the medication ordered.   GABAPENTIN (NEURONTIN) 300 MG CAPSULE    Take 300-900 mg by mouth See admin instructions.  MULTIPLE VITAMIN (MULTIVITAMIN WITH MINERALS) TABS TABLET    Take 1 tablet by mouth daily. Centrum Heart   NIACIN (NIASPAN) 1000 MG CR TABLET    Take 2 tablets (2,000 mg total) by mouth at bedtime.   OMEGA-3 ACID ETHYL ESTERS (LOVAZA) 1 G CAPSULE    Take 2 g by mouth 2 (two) times daily.    OMEPRAZOLE (PRILOSEC) 20 MG CAPSULE    Take 20 mg by mouth daily.   OZEMPIC, 2 MG/DOSE, 8 MG/3ML SOPN    Inject into the skin.   POLYETHYLENE GLYCOL (MIRALAX / GLYCOLAX) 17 G PACKET     Take 17 g by mouth daily.   SENNA (SENOKOT) 8.6 MG TABS TABLET    Take 2 tablets (17.2 mg total) by mouth 2 (two) times daily.  Modified Medications   No medications on file  Discontinued Medications   No medications on file    Subjective: 67 year old female with past medical history as below presents for hospital follow-up of left ankle septic arthritis with concern for osteomyelitis.  Patient presented to ED with 2-day history of left ankle pain/swelling.  She underwent arthrocentesis of left ankle in the ED with cultures growing Klebsiella pneumonia and MSSA.  Taken to the OR with Dr. Victorino Dike for I&D with cultures also growing Klebsiella pneumonia and MSSA.  Discharged on ertapenem x 6 weeks given osteomyelitis concern on imaging.  EOT of antibiotics 11/09/2023. Today 12/17 : Discussed the use of AI scribe software for clinical note transcription with the patient, who gave verbal consent to proceed.  History of Present Illness   Tolerated IV abx.  She expresses discomfort and pain from the orthopedic boot she is wearing. The patient saw Dr. Victorino Dike, last Tuesday, who reported that the foot was looking okay. Stitches in place. The patient also reports a black, blue mark on her heel, which she believes is due to the pressure from the boot. She also notes two more marks on the side of her foot, which she fears might open up. She denies drainage from wound       Review of Systems: Review of Systems  All other systems reviewed and are negative.   Past Medical History:  Diagnosis Date   Arthritis    R ankle    Back pain    Chronic kidney disease    seen by Nephrology at [facility], due to all her numbers were"off", she  remarks that she was sent to superhydrate & then her labs values corrected.  Pt. will f/u /w nephrology   Constipation    Fatty liver    Gallbladder problem    GERD (gastroesophageal reflux disease)    occ   Gout    Hyperlipidemia    Hypertension    Joint pain    Lower  extremity edema    Obesity    Peripheral neuropathy    Pneumonia 1980's   hx- not needed hospitalization    Vitamin D deficiency     Social History   Tobacco Use   Smoking status: Never   Smokeless tobacco: Never  Vaping Use   Vaping status: Never Used  Substance Use Topics   Alcohol use: Yes    Comment: wine for special occasion    Drug use: No    Family History  Problem Relation Age of Onset   Heart failure Father    Sudden death Father     Allergies  Allergen Reactions   Celebrex [Celecoxib] Diarrhea and Other (See Comments)  Cause bloody stools   Ibuprofen Diarrhea and Other (See Comments)    Caused bloody stools   Meloxicam Diarrhea and Other (See Comments)    Caused bloody stools   Amoxicillin-Pot Clavulanate    Lipitor [Atorvastatin] Other (See Comments)    myalgia   Nsaids     Other reaction(s): Unknown   Statins Other (See Comments)    myalgia   Amoxicillin Itching and Rash   Penicillins Rash    Health Maintenance  Topic Date Due   Medicare Annual Wellness (AWV)  Never done   Hepatitis C Screening  Never done   Zoster Vaccines- Shingrix (1 of 2) 12/17/1972   Colonoscopy  Never done   MAMMOGRAM  Never done   DEXA SCAN  Never done   INFLUENZA VACCINE  06/02/2023   COVID-19 Vaccine (8 - 2024-25 season) 07/03/2023   DTaP/Tdap/Td (3 - Td or Tdap) 04/30/2025   Pneumonia Vaccine 29+ Years old  Completed   HPV VACCINES  Aged Out    Objective:  Vitals:   10/18/23 1404  BP: 118/76  Pulse: 67  Temp: 97.8 F (36.6 C)  TempSrc: Temporal  SpO2: 96%   There is no height or weight on file to calculate BMI.  Physical Exam Constitutional:      Appearance: Normal appearance.  HENT:     Head: Normocephalic and atraumatic.     Right Ear: Tympanic membrane normal.     Left Ear: Tympanic membrane normal.     Nose: Nose normal.     Mouth/Throat:     Mouth: Mucous membranes are moist.  Eyes:     Extraocular Movements: Extraocular movements  intact.     Conjunctiva/sclera: Conjunctivae normal.     Pupils: Pupils are equal, round, and reactive to light.  Cardiovascular:     Rate and Rhythm: Normal rate and regular rhythm.     Heart sounds: No murmur heard.    No friction rub. No gallop.  Pulmonary:     Effort: Pulmonary effort is normal.     Breath sounds: Normal breath sounds.  Abdominal:     General: Abdomen is flat.     Palpations: Abdomen is soft.  Musculoskeletal:     Comments: Foot in cast   Skin:    General: Skin is warm and dry.  Neurological:     General: No focal deficit present.     Mental Status: She is alert and oriented to person, place, and time.  Psychiatric:        Mood and Affect: Mood normal.      Lab Results Lab Results  Component Value Date   WBC 9.1 10/03/2023   HGB 10.3 (L) 10/03/2023   HCT 29.8 (L) 10/03/2023   MCV 87.4 10/03/2023   PLT 188 10/03/2023    Lab Results  Component Value Date   CREATININE 0.63 10/03/2023   BUN 14 10/03/2023   NA 134 (L) 10/03/2023   K 3.8 10/03/2023   CL 98 10/03/2023   CO2 27 10/03/2023    Lab Results  Component Value Date   ALT 40 09/30/2023   AST 32 09/30/2023   ALKPHOS 131 (H) 09/30/2023   BILITOT 0.8 09/30/2023    Lab Results  Component Value Date   CHOL 225 (A) 12/26/2020   HDL 65 12/26/2020   LDLCALC 124 12/26/2020   TRIG 207 (A) 12/26/2020   No results found for: "LABRPR", "RPRTITER" No results found for: "HIV1RNAQUANT", "HIV1RNAVL", "CD4TABS"   Problem List Items Addressed This  Visit   None  Results          Assessment/Plan #Left ankle septic arthritis, concern for osteomyelitis status post I&D on 11/28  Cx+ kleb pneumo  and MSSA #Medication montoring Patient is currently on IV antibiotics with a PICC line. The wound is improving and there is no current drainage. There are new areas of concern on the foot due to pressure from the boot. The patient is receiving wound care at [facility]. Plan -Continue erta through  11/09/2023. -Check labs when received from the skilled nursing facility. -F/U on 11/09/2023  -Advise patient to report any worsening drainage, fevers, or chills. She has ortho f/u         Danelle Earthly, MD [facility] 10/24/2023, 4:12 AM   I have personally spent 43 minutes involved in face-to-face and non-face-to-face activities for this patient on the day of the visit. Professional time spent includes the following activities: Preparing to see the patient (review of tests), Obtaining and/or reviewing separately obtained history (admission/discharge record), Performing a medically appropriate examination and/or evaluation , Ordering medications/tests/procedures, referring and communicating with other health care professionals, Documenting clinical information in the EMR, Independently interpreting results (not separately reported), Communicating results to the patient/family/caregiver, Counseling and educating the patient/family/caregiver and Care coordination (not separately reported).  e

## 2023-10-18 NOTE — Telephone Encounter (Signed)
Called [facility] [phone])  and spoke to patient nurse who stated that they did not get her labs last week, they were done today and she will fax them to our office in the morning.

## 2023-10-18 NOTE — Patient Instructions (Signed)
-  FOOT INFECTION:You are currently on IV antibiotics through a PICC line, and your wound is improving with no current drainage. Continue your antibiotic regimen through November 09, 2023, and report any worsening drainage, fevers, or chills. We will check your labs from the skilled nursing facility and see you on November 09, 2023, to assess the need for continued antibiotics.   INSTRUCTIONS:  Continue your current antibiotic regimen and wound care. Report any worsening symptoms immediately. We will see you on November 09, 2023, to assess your need for continued antibiotics. Discuss any new concerns  for boot alternatives with Dr. Victorino Dike at your next visit.

## 2023-10-20 ENCOUNTER — Telehealth: Payer: Self-pay | Admitting: Internal Medicine

## 2023-10-20 NOTE — Telephone Encounter (Signed)
Evelyn Campbell called requesting to speak with Dr. Thedore Mins regarding her previous appointment. She stated due to the labs being unavailable she did not receive much information at the appointment. She is hoping for an update regarding if the antibiotic is working, are things improving, etc. She prefers to speak on the phone for better explanation rather than MyChart. Due to rehab appointments she may be unavailable but leave a voicemail and she will return the call.

## 2023-10-21 ENCOUNTER — Telehealth: Payer: Self-pay | Admitting: Internal Medicine

## 2023-10-21 NOTE — Telephone Encounter (Signed)
Called pt and reviewed labs form 12/17 with crp >20, esr 45h, wbc 5.8, scr 0.59. Will continue to trend labs. F/U on 11/09/23

## 2023-10-29 LAB — FUNGUS CULTURE WITH STAIN

## 2023-10-29 LAB — FUNGUS CULTURE RESULT

## 2023-10-29 LAB — FUNGAL ORGANISM REFLEX

## 2023-11-09 ENCOUNTER — Ambulatory Visit: Payer: Self-pay | Admitting: Internal Medicine

## 2023-11-10 ENCOUNTER — Telehealth (INDEPENDENT_AMBULATORY_CARE_PROVIDER_SITE_OTHER): Payer: Medicare PPO | Admitting: Internal Medicine

## 2023-11-10 ENCOUNTER — Telehealth: Payer: Self-pay

## 2023-11-10 ENCOUNTER — Encounter: Payer: Self-pay | Admitting: Internal Medicine

## 2023-11-10 ENCOUNTER — Other Ambulatory Visit: Payer: Self-pay

## 2023-11-10 DIAGNOSIS — M00272 Other streptococcal arthritis, left ankle and foot: Secondary | ICD-10-CM

## 2023-11-10 DIAGNOSIS — M009 Pyogenic arthritis, unspecified: Secondary | ICD-10-CM

## 2023-11-10 NOTE — Telephone Encounter (Signed)
 Called [facility] and spoke with Joni Reining, RN  regarding orders to pull picc. Verbalized understanding. Juanita Laster, RMA

## 2023-11-10 NOTE — Progress Notes (Signed)
 Virtual Visit via Telephone/Video Note   I connected with Evelyn Campbell   On 11/10/2023 at 10:43 AM  by Video and verified that I am speaking with the correct person using two identifiers.   I discussed the limitations, risks, security and privacy concerns of performing an evaluation and management service by telephone and the availability of in person appointments. I also discussed with the patient that there may be a patient responsible charge related to this service. The patient expressed understanding and agreed to proceed.   Location:   Patient: Home Provider: [facility]            Patient Active Problem List   Diagnosis Date Noted   Septic arthritis of ankle (HCC) 09/29/2023   Hyponatremia 09/29/2023   Hypokalemia 09/29/2023   Transaminitis 09/29/2023   Mild protein malnutrition (HCC) 09/29/2023   Hyperbilirubinemia 09/29/2023   Thrombocytopenia (HCC) 09/29/2023   Abnormal liver function tests 05/25/2022   Abnormal weight gain 05/25/2022   Arthritis 05/25/2022   Body mass index (BMI) 38.0-38.9, adult 05/25/2022   Change in bowel habit 05/25/2022   Colon cancer screening 05/25/2022   Difficulty walking 05/25/2022   Drug-induced myopathy 05/25/2022   Fatty liver 05/25/2022   Flatulence, eructation and gas pain 05/25/2022   Gastroesophageal reflux disease 05/25/2022   Gout 05/25/2022   Hardening of the aorta (main artery of the heart) (HCC) 05/25/2022   Lymphedema 05/25/2022   Neuropathy 05/25/2022   Other long term (current) drug therapy 05/25/2022   Other specified disorders of kidney and ureter 05/25/2022   Periumbilical pain 05/25/2022   Rectal bleeding 05/25/2022   Ulcer of right foot with fat layer exposed (HCC) 05/25/2022   Lymphedema of left leg 04/21/2021   Idiopathic chronic gout of multiple sites without tophus 03/10/2021   Diverticular disease of colon 01/08/2021   Idiopathic peripheral neuropathy 01/08/2021   Mixed hyperlipidemia 01/08/2021    Peripheral venous insufficiency 01/08/2021   History of colonic polyps 01/08/2021   Vitamin D  deficiency 01/08/2021   Metabolic syndrome 01/08/2021   Prediabetes 01/08/2021   Visceral obesity 12/25/2020   Screening for viral disease 05/18/2019   Other ganglion and cyst of synovium, tendon, and bursa 05/03/2019   Foot mass, right 05/01/2019   Degenerative arthritis of right ankle 10/05/2016   Incisional hernia 07/02/2015   Left adrenal mass (HCC) 08/20/2014   Essential hypertension 06/14/2014   Family history of ischemic heart disease 06/14/2014   Class 3 severe obesity with serious comorbidity and body mass index (BMI) of 40.0 to 44.9 in adult (HCC) 06/14/2014   Status post ankle arthrodesis 05/07/2014   Posterior equinus, acquired 02/05/2014   Charcot's joint of left foot 02/05/2014   Pes planovalgus, acquired 05/10/2013   Posterior tibial tendon dysfunction 05/10/2013   Primary osteoarthritis, unspecified ankle and foot 05/10/2013    Patient's Medications  New Prescriptions   No medications on file  Previous Medications   ACETAMINOPHEN  (TYLENOL ) 500 MG TABLET    Take 500 mg by mouth as needed for mild pain.   ALLOPURINOL  (ZYLOPRIM ) 300 MG TABLET    Take 300 mg by mouth daily.   ATENOLOL  (TENORMIN ) 100 MG TABLET    Take 100 mg by mouth at bedtime.   CHOLECALCIFEROL  (VITAMIN D ) 2000 UNITS TABLET    Take 2,000 Units by mouth daily.   COLCHICINE 0.6 MG TABLET    0.12 mg as needed.   DOCUSATE SODIUM  (COLACE) 100 MG CAPSULE    Take 200 mg by mouth daily.  ERTAPENEM  (INVANZ ) IVPB    Inject 1 g into the vein daily. Indication:  Septic arthritis of ankle  First Dose: Yes Last Day of Therapy:  11/09/23  Labs - Once weekly:  CBC/D and BMP, Labs - Once weekly: ESR and CRP Method of administration: Mini-Bag Plus / Gravity Method of administration may be changed at the discretion of home infusion pharmacist based upon assessment of the patient and/or caregiver's ability to self-administer  the medication ordered.   GABAPENTIN  (NEURONTIN ) 300 MG CAPSULE    Take 300-900 mg by mouth See admin instructions.   MULTIPLE VITAMIN (MULTIVITAMIN WITH MINERALS) TABS TABLET    Take 1 tablet by mouth daily. [facility]   NIACIN  (NIASPAN ) 1000 MG CR TABLET    Take 2 tablets (2,000 mg total) by mouth at bedtime.   OMEGA-3 ACID ETHYL ESTERS (LOVAZA ) 1 G CAPSULE    Take 2 g by mouth 2 (two) times daily.    OMEPRAZOLE  (PRILOSEC ) 20 MG CAPSULE    Take 20 mg by mouth daily.   OZEMPIC , 2 MG/DOSE, 8 MG/3ML SOPN    Inject into the skin.   POLYETHYLENE GLYCOL (MIRALAX  / GLYCOLAX ) 17 G PACKET    Take 17 g by mouth daily.   SENNA (SENOKOT) 8.6 MG TABS TABLET    Take 2 tablets (17.2 mg total) by mouth 2 (two) times daily.  Modified Medications   No medications on file  Discontinued Medications   No medications on file    Subjective: 68 year old female with past medical history as below presents for hospital follow-up of left ankle septic arthritis with concern for osteomyelitis.  Patient presented to ED with 2-day history of left ankle pain/swelling.  She underwent arthrocentesis of left ankle in the ED with cultures growing Klebsiella pneumonia and MSSA.  Taken to the OR with Dr. Kit for I&D with cultures also growing Klebsiella pneumonia and MSSA.  Discharged on ertapenem  x 6 weeks given osteomyelitis concern on imaging.  EOT of antibiotics 11/09/2023. 12/17 : Discussed the use of AI scribe software for clinical note transcription with the patient, who gave verbal consent to proceed. Tolerated IV abx.  She expresses discomfort and pain from the orthopedic boot she is wearing. The patient saw Dr. Kit, last Tuesday, who reported that the foot was looking okay. Stitches in place. The patient also reports a black, blue mark on her heel, which she believes is due to the pressure from the boot. She also notes two more marks on the side of her foot, which she fears might open up. She denies drainage from  wound  Today 1/9/2 5 tele: Pt states she is now in tennis shoes. PICC still in in . NO new complaints   Review of Systems: Review of Systems  All other systems reviewed and are negative.   Past Medical History:  Diagnosis Date   Arthritis    R ankle    Back pain    Chronic kidney disease    seen by Nephrology at [facility], due to all her numbers wereoff, she  remarks that she was sent to superhydrate & then her labs values corrected.  Pt. will f/u /w nephrology   Constipation    Fatty liver    Gallbladder problem    GERD (gastroesophageal reflux disease)    occ   Gout    Hyperlipidemia    Hypertension    Joint pain    Lower extremity edema    Obesity    Peripheral neuropathy  Pneumonia 1980's   hx- not needed hospitalization    Vitamin D  deficiency     Social History   Tobacco Use   Smoking status: Never   Smokeless tobacco: Never  Vaping Use   Vaping status: Never Used  Substance Use Topics   Alcohol use: Yes    Comment: wine for special occasion    Drug use: No    Family History  Problem Relation Age of Onset   Heart failure Father    Sudden death Father     Allergies  Allergen Reactions   Celebrex [Celecoxib] Diarrhea and Other (See Comments)    Cause bloody stools   Ibuprofen Diarrhea and Other (See Comments)    Caused bloody stools   Meloxicam Diarrhea and Other (See Comments)    Caused bloody stools   Amoxicillin-Pot Clavulanate    Lipitor [Atorvastatin] Other (See Comments)    myalgia   Nsaids     Other reaction(s): Unknown   Statins Other (See Comments)    myalgia   Amoxicillin Itching and Rash   Penicillins Rash    Health Maintenance  Topic Date Due   Medicare Annual Wellness (AWV)  Never done   Hepatitis C Screening  Never done   Zoster Vaccines- Shingrix (1 of 2) 12/17/1972   Colonoscopy  Never done   MAMMOGRAM  Never done   DEXA SCAN  Never done   INFLUENZA VACCINE  06/02/2023   COVID-19 Vaccine (8 - 2024-25 season)  07/03/2023   DTaP/Tdap/Td (3 - Td or Tdap) 04/30/2025   Pneumonia Vaccine 35+ Years old  Completed   HPV VACCINES  Aged Out    Objective:  There were no vitals filed for this visit. There is no height or weight on file to calculate BMI.    Lab Results Lab Results  Component Value Date   WBC 9.1 10/03/2023   HGB 10.3 (L) 10/03/2023   HCT 29.8 (L) 10/03/2023   MCV 87.4 10/03/2023   PLT 188 10/03/2023    Lab Results  Component Value Date   CREATININE 0.63 10/03/2023   BUN 14 10/03/2023   NA 134 (L) 10/03/2023   K 3.8 10/03/2023   CL 98 10/03/2023   CO2 27 10/03/2023    Lab Results  Component Value Date   ALT 40 09/30/2023   AST 32 09/30/2023   ALKPHOS 131 (H) 09/30/2023   BILITOT 0.8 09/30/2023    Lab Results  Component Value Date   CHOL 225 (A) 12/26/2020   HDL 65 12/26/2020   LDLCALC 124 12/26/2020   TRIG 207 (A) 12/26/2020   No results found for: LABRPR, RPRTITER No results found for: HIV1RNAQUANT, HIV1RNAVL, CD4TABS   Problem List Items Addressed This Visit   None  Results          Assessment/Plan #Left ankle septic arthritis, concern for osteomyelitis status post I&D on 11/28  Cx+ kleb pneumo  and MSSA Patient is currently on IV antibiotics with a PICC line. The wound is improving and there is no current drainage. There are new areas of concern on the foot due to pressure from the boot. The patient is receiving wound care at [facility]. -Completed erta through 11/09/23 Plan -Pt seen by [facility], in tennis shoes now. No issues reported with left foot. Seen by podiatry today for left foot. -Pull PICC  #Medication management -wbs6.2, scr 0.39,esr 25 on 11/05/23 -Follow up PRN     Loney Stank, MD [facility] 11/10/2023,  11:03 AM   I have personally spent 42 minutes involved in face-to-face and non-face-to-face activities for this patient on the day of the visit. Professional time spent  includes the following activities:

## 2024-02-20 ENCOUNTER — Encounter (HOSPITAL_BASED_OUTPATIENT_CLINIC_OR_DEPARTMENT_OTHER): Attending: Internal Medicine | Admitting: Internal Medicine

## 2024-02-20 DIAGNOSIS — G629 Polyneuropathy, unspecified: Secondary | ICD-10-CM | POA: Diagnosis not present

## 2024-02-20 DIAGNOSIS — L97512 Non-pressure chronic ulcer of other part of right foot with fat layer exposed: Secondary | ICD-10-CM | POA: Insufficient documentation

## 2024-02-20 DIAGNOSIS — I872 Venous insufficiency (chronic) (peripheral): Secondary | ICD-10-CM | POA: Diagnosis not present

## 2024-02-28 ENCOUNTER — Encounter (HOSPITAL_BASED_OUTPATIENT_CLINIC_OR_DEPARTMENT_OTHER): Admitting: Internal Medicine

## 2024-02-28 DIAGNOSIS — I872 Venous insufficiency (chronic) (peripheral): Secondary | ICD-10-CM

## 2024-02-28 DIAGNOSIS — L97512 Non-pressure chronic ulcer of other part of right foot with fat layer exposed: Secondary | ICD-10-CM

## 2024-02-28 DIAGNOSIS — G629 Polyneuropathy, unspecified: Secondary | ICD-10-CM

## 2024-03-08 ENCOUNTER — Ambulatory Visit (HOSPITAL_BASED_OUTPATIENT_CLINIC_OR_DEPARTMENT_OTHER): Admitting: Internal Medicine

## 2024-03-15 ENCOUNTER — Encounter (HOSPITAL_BASED_OUTPATIENT_CLINIC_OR_DEPARTMENT_OTHER): Attending: Internal Medicine | Admitting: Internal Medicine

## 2024-03-15 DIAGNOSIS — E11621 Type 2 diabetes mellitus with foot ulcer: Secondary | ICD-10-CM | POA: Diagnosis not present

## 2024-03-15 DIAGNOSIS — X58XXXA Exposure to other specified factors, initial encounter: Secondary | ICD-10-CM | POA: Diagnosis not present

## 2024-03-15 DIAGNOSIS — L97512 Non-pressure chronic ulcer of other part of right foot with fat layer exposed: Secondary | ICD-10-CM | POA: Diagnosis not present

## 2024-03-15 DIAGNOSIS — G629 Polyneuropathy, unspecified: Secondary | ICD-10-CM | POA: Diagnosis not present

## 2024-03-15 DIAGNOSIS — S91302A Unspecified open wound, left foot, initial encounter: Secondary | ICD-10-CM

## 2024-03-15 DIAGNOSIS — I872 Venous insufficiency (chronic) (peripheral): Secondary | ICD-10-CM | POA: Diagnosis not present

## 2024-03-21 ENCOUNTER — Encounter (HOSPITAL_BASED_OUTPATIENT_CLINIC_OR_DEPARTMENT_OTHER): Admitting: Internal Medicine

## 2024-03-21 DIAGNOSIS — S91302A Unspecified open wound, left foot, initial encounter: Secondary | ICD-10-CM

## 2024-05-14 ENCOUNTER — Encounter (HOSPITAL_BASED_OUTPATIENT_CLINIC_OR_DEPARTMENT_OTHER): Attending: Internal Medicine | Admitting: Internal Medicine

## 2024-05-14 DIAGNOSIS — L97512 Non-pressure chronic ulcer of other part of right foot with fat layer exposed: Secondary | ICD-10-CM | POA: Diagnosis not present

## 2024-05-14 DIAGNOSIS — Z6837 Body mass index (BMI) 37.0-37.9, adult: Secondary | ICD-10-CM | POA: Insufficient documentation

## 2024-05-14 DIAGNOSIS — I872 Venous insufficiency (chronic) (peripheral): Secondary | ICD-10-CM | POA: Diagnosis not present

## 2024-05-14 DIAGNOSIS — S91302A Unspecified open wound, left foot, initial encounter: Secondary | ICD-10-CM | POA: Diagnosis not present

## 2024-05-14 DIAGNOSIS — G629 Polyneuropathy, unspecified: Secondary | ICD-10-CM | POA: Insufficient documentation

## 2024-05-14 DIAGNOSIS — X58XXXA Exposure to other specified factors, initial encounter: Secondary | ICD-10-CM | POA: Diagnosis not present

## 2024-05-21 ENCOUNTER — Encounter (HOSPITAL_BASED_OUTPATIENT_CLINIC_OR_DEPARTMENT_OTHER): Admitting: Internal Medicine

## 2024-05-21 DIAGNOSIS — S91302A Unspecified open wound, left foot, initial encounter: Secondary | ICD-10-CM

## 2024-05-21 DIAGNOSIS — L97512 Non-pressure chronic ulcer of other part of right foot with fat layer exposed: Secondary | ICD-10-CM

## 2024-05-21 DIAGNOSIS — G629 Polyneuropathy, unspecified: Secondary | ICD-10-CM

## 2024-05-21 DIAGNOSIS — I872 Venous insufficiency (chronic) (peripheral): Secondary | ICD-10-CM | POA: Diagnosis not present

## 2024-05-28 ENCOUNTER — Encounter (HOSPITAL_BASED_OUTPATIENT_CLINIC_OR_DEPARTMENT_OTHER): Admitting: Internal Medicine

## 2024-05-28 DIAGNOSIS — L97512 Non-pressure chronic ulcer of other part of right foot with fat layer exposed: Secondary | ICD-10-CM | POA: Diagnosis not present

## 2024-06-04 ENCOUNTER — Encounter (HOSPITAL_BASED_OUTPATIENT_CLINIC_OR_DEPARTMENT_OTHER): Attending: Internal Medicine | Admitting: Internal Medicine

## 2024-06-04 DIAGNOSIS — G629 Polyneuropathy, unspecified: Secondary | ICD-10-CM | POA: Diagnosis not present

## 2024-06-04 DIAGNOSIS — I872 Venous insufficiency (chronic) (peripheral): Secondary | ICD-10-CM | POA: Insufficient documentation

## 2024-06-04 DIAGNOSIS — L97512 Non-pressure chronic ulcer of other part of right foot with fat layer exposed: Secondary | ICD-10-CM | POA: Diagnosis not present

## 2024-06-11 ENCOUNTER — Encounter (HOSPITAL_BASED_OUTPATIENT_CLINIC_OR_DEPARTMENT_OTHER): Admitting: Internal Medicine

## 2024-06-18 ENCOUNTER — Encounter (HOSPITAL_BASED_OUTPATIENT_CLINIC_OR_DEPARTMENT_OTHER): Admitting: Internal Medicine

## 2024-06-18 DIAGNOSIS — G629 Polyneuropathy, unspecified: Secondary | ICD-10-CM | POA: Diagnosis not present

## 2024-06-18 DIAGNOSIS — L97512 Non-pressure chronic ulcer of other part of right foot with fat layer exposed: Secondary | ICD-10-CM

## 2024-06-25 ENCOUNTER — Encounter (HOSPITAL_BASED_OUTPATIENT_CLINIC_OR_DEPARTMENT_OTHER): Admitting: Internal Medicine

## 2024-06-25 DIAGNOSIS — L97512 Non-pressure chronic ulcer of other part of right foot with fat layer exposed: Secondary | ICD-10-CM

## 2024-06-25 DIAGNOSIS — G629 Polyneuropathy, unspecified: Secondary | ICD-10-CM

## 2024-07-03 ENCOUNTER — Encounter (HOSPITAL_BASED_OUTPATIENT_CLINIC_OR_DEPARTMENT_OTHER): Admitting: Internal Medicine

## 2024-07-09 ENCOUNTER — Encounter (HOSPITAL_BASED_OUTPATIENT_CLINIC_OR_DEPARTMENT_OTHER): Attending: Internal Medicine | Admitting: Internal Medicine

## 2024-07-09 DIAGNOSIS — L97512 Non-pressure chronic ulcer of other part of right foot with fat layer exposed: Secondary | ICD-10-CM | POA: Insufficient documentation

## 2024-07-09 DIAGNOSIS — I872 Venous insufficiency (chronic) (peripheral): Secondary | ICD-10-CM | POA: Insufficient documentation

## 2024-07-09 DIAGNOSIS — G629 Polyneuropathy, unspecified: Secondary | ICD-10-CM | POA: Diagnosis not present

## 2024-07-31 ENCOUNTER — Encounter: Payer: Self-pay | Admitting: Cardiology

## 2024-07-31 ENCOUNTER — Ambulatory Visit: Attending: Cardiology | Admitting: Cardiology

## 2024-07-31 VITALS — BP 123/85 | HR 55 | Ht 65.0 in | Wt 230.0 lb

## 2024-07-31 DIAGNOSIS — I700 Atherosclerosis of aorta: Secondary | ICD-10-CM | POA: Insufficient documentation

## 2024-07-31 DIAGNOSIS — Z8249 Family history of ischemic heart disease and other diseases of the circulatory system: Secondary | ICD-10-CM | POA: Diagnosis present

## 2024-07-31 DIAGNOSIS — I10 Essential (primary) hypertension: Secondary | ICD-10-CM | POA: Insufficient documentation
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
## 2024-07-31 NOTE — Patient Instructions (Signed)

## 2024-10-23 ENCOUNTER — Other Ambulatory Visit: Payer: Self-pay | Admitting: Orthopedic Surgery

## 2024-10-23 DIAGNOSIS — M14679 Charcot's joint, unspecified ankle and foot: Secondary | ICD-10-CM

## 2024-10-30 ENCOUNTER — Encounter: Payer: Self-pay | Admitting: Orthopedic Surgery

## 2024-11-02 ENCOUNTER — Ambulatory Visit
Admission: RE | Admit: 2024-11-02 | Discharge: 2024-11-02 | Disposition: A | Discharge status: Home / Self Care | Source: Ambulatory Visit | Attending: Orthopedic Surgery | Admitting: Orthopedic Surgery

## 2024-11-02 ENCOUNTER — Other Ambulatory Visit

## 2024-11-02 DIAGNOSIS — M14679 Charcot's joint, unspecified ankle and foot: Secondary | ICD-10-CM

## 2024-11-27 ENCOUNTER — Telehealth: Payer: Self-pay

## 2024-11-27 NOTE — Telephone Encounter (Signed)
 I have printed this message out for Dr Prentiss to see from her previous pt. I have placed it on her desk to review. Closing encounter.

## 2024-11-27 NOTE — Telephone Encounter (Signed)
 Copied from CRM #[id number]. Topic: General - Other >> Nov 27, 2024 12:50 PM Robinson H wrote: Reason for CRM: States she a previous patient of Dr. Prentiss for weight management, advised patient that Dr. Prentiss see's patients for primacy care and weight management not just weight management alone. Patient was adamant about wanting to send provider a message wanting to say hi and she's thinking about her, states they were close and she just found out Dr. Prentiss isn't at [facility] anymore.  Shanetha [phone]
